# Patient Record
Sex: Female | Born: 1935
Health system: Southern US, Community
[De-identification: ages and names within clinical notes are randomized; demographics above are authoritative.]

## PROBLEM LIST (undated history)

## (undated) DIAGNOSIS — R682 Dry mouth, unspecified: Secondary | ICD-10-CM

## (undated) DIAGNOSIS — R8271 Bacteriuria: Secondary | ICD-10-CM

## (undated) DIAGNOSIS — L24A2 Irritant contact dermatitis due to fecal, urinary or dual incontinence: Secondary | ICD-10-CM

## (undated) DIAGNOSIS — Z8673 Personal history of transient ischemic attack (TIA), and cerebral infarction without residual deficits: Secondary | ICD-10-CM

## (undated) DIAGNOSIS — M19012 Primary osteoarthritis, left shoulder: Secondary | ICD-10-CM

## (undated) DIAGNOSIS — N1831 Chronic kidney disease, stage 3a: Secondary | ICD-10-CM

## (undated) DIAGNOSIS — I10 Essential (primary) hypertension: Secondary | ICD-10-CM

## (undated) DIAGNOSIS — D508 Other iron deficiency anemias: Secondary | ICD-10-CM

## (undated) DIAGNOSIS — F03B0 Unspecified dementia, moderate, without behavioral disturbance, psychotic disturbance, mood disturbance, and anxiety: Secondary | ICD-10-CM

## (undated) DIAGNOSIS — R001 Bradycardia, unspecified: Secondary | ICD-10-CM

## (undated) DIAGNOSIS — E119 Type 2 diabetes mellitus without complications: Secondary | ICD-10-CM

## (undated) DIAGNOSIS — L2089 Other atopic dermatitis: Secondary | ICD-10-CM

## (undated) DIAGNOSIS — F17200 Nicotine dependence, unspecified, uncomplicated: Secondary | ICD-10-CM

## (undated) DIAGNOSIS — L299 Pruritus, unspecified: Secondary | ICD-10-CM

## (undated) DIAGNOSIS — G629 Polyneuropathy, unspecified: Secondary | ICD-10-CM

## (undated) DIAGNOSIS — M6281 Muscle weakness (generalized): Secondary | ICD-10-CM

## (undated) DIAGNOSIS — L03115 Cellulitis of right lower limb: Secondary | ICD-10-CM

## (undated) DIAGNOSIS — S43021A Posterior subluxation of right humerus, initial encounter: Secondary | ICD-10-CM

## (undated) DIAGNOSIS — L509 Urticaria, unspecified: Secondary | ICD-10-CM

## (undated) DIAGNOSIS — R8281 Pyuria: Secondary | ICD-10-CM

## (undated) DIAGNOSIS — E43 Unspecified severe protein-calorie malnutrition: Secondary | ICD-10-CM

## (undated) DIAGNOSIS — M170 Bilateral primary osteoarthritis of knee: Secondary | ICD-10-CM

## (undated) DIAGNOSIS — K5900 Constipation, unspecified: Secondary | ICD-10-CM

## (undated) DIAGNOSIS — G3184 Mild cognitive impairment, so stated: Secondary | ICD-10-CM

## (undated) DIAGNOSIS — Z87440 Personal history of urinary (tract) infections: Secondary | ICD-10-CM

## (undated) DIAGNOSIS — G9341 Metabolic encephalopathy: Secondary | ICD-10-CM

## (undated) DIAGNOSIS — J9601 Acute respiratory failure with hypoxia: Secondary | ICD-10-CM

## (undated) DIAGNOSIS — D631 Anemia in chronic kidney disease: Secondary | ICD-10-CM

## (undated) DIAGNOSIS — R770 Abnormality of albumin: Secondary | ICD-10-CM

## (undated) DIAGNOSIS — R41841 Cognitive communication deficit: Secondary | ICD-10-CM

## (undated) DIAGNOSIS — E079 Disorder of thyroid, unspecified: Secondary | ICD-10-CM

## (undated) DIAGNOSIS — E871 Hypo-osmolality and hyponatremia: Secondary | ICD-10-CM

## (undated) DIAGNOSIS — M79661 Pain in right lower leg: Secondary | ICD-10-CM

## (undated) DIAGNOSIS — N189 Chronic kidney disease, unspecified: Secondary | ICD-10-CM

## (undated) DIAGNOSIS — I739 Peripheral vascular disease, unspecified: Secondary | ICD-10-CM

## (undated) DIAGNOSIS — M5134 Other intervertebral disc degeneration, thoracic region: Secondary | ICD-10-CM

## (undated) HISTORY — DX: Unspecified dementia, moderate, without behavioral disturbance, psychotic disturbance, mood disturbance, and anxiety: F03.B0

## (undated) HISTORY — DX: Muscle weakness (generalized): M62.81

## (undated) HISTORY — DX: Nicotine dependence, unspecified, uncomplicated: F17.200

## (undated) HISTORY — DX: Anemia in chronic kidney disease: N18.9

## (undated) HISTORY — DX: Peripheral vascular disease, unspecified: I73.9

## (undated) HISTORY — DX: Abnormality of albumin: R77.0

## (undated) HISTORY — DX: Urticaria, unspecified: L50.9

## (undated) HISTORY — DX: Hypo-osmolality and hyponatremia: E87.1

## (undated) HISTORY — DX: Pruritus, unspecified: L29.9

## (undated) HISTORY — DX: Polyneuropathy, unspecified: G62.9

## (undated) HISTORY — DX: Bradycardia, unspecified: R00.1

## (undated) HISTORY — DX: Other intervertebral disc degeneration, thoracic region: M51.34

## (undated) HISTORY — DX: Irritant contact dermatitis due to fecal, urinary or dual incontinence: L24.A2

## (undated) HISTORY — DX: Pyuria: R82.81

## (undated) HISTORY — DX: Unspecified severe protein-calorie malnutrition: E43

## (undated) HISTORY — DX: Bacteriuria: R82.71

## (undated) HISTORY — DX: Constipation, unspecified: K59.00

## (undated) HISTORY — DX: Posterior subluxation of right humerus, initial encounter: S43.021A

## (undated) HISTORY — DX: Essential (primary) hypertension: I10

## (undated) HISTORY — DX: Acute respiratory failure with hypoxia: J96.01

## (undated) HISTORY — DX: Mild cognitive impairment of uncertain or unknown etiology: G31.84

## (undated) HISTORY — DX: Bilateral primary osteoarthritis of knee: M17.0

## (undated) HISTORY — DX: Chronic kidney disease, stage 3a: N18.31

## (undated) HISTORY — DX: Personal history of urinary (tract) infections: Z87.440

## (undated) HISTORY — DX: Pain in right lower leg: M79.661

## (undated) HISTORY — DX: Cognitive communication deficit: R41.841

## (undated) HISTORY — DX: Metabolic encephalopathy: G93.41

## (undated) HISTORY — DX: Disorder of thyroid, unspecified: E07.9

## (undated) HISTORY — DX: Personal history of transient ischemic attack (TIA), and cerebral infarction without residual deficits: Z86.73

## (undated) HISTORY — DX: Other atopic dermatitis: L20.89

## (undated) HISTORY — DX: Hypocalcemia: E83.51

## (undated) HISTORY — DX: Primary osteoarthritis, left shoulder: M19.012

## (undated) HISTORY — DX: Dry mouth, unspecified: R68.2

## (undated) HISTORY — DX: Cellulitis of right lower limb: L03.115

## (undated) HISTORY — DX: Other iron deficiency anemias: D50.8

## (undated) HISTORY — DX: Chronic kidney disease, unspecified: D63.1

---

## 1999-09-29 ENCOUNTER — Encounter: Payer: Self-pay | Admitting: Family Medicine

## 1999-09-29 ENCOUNTER — Encounter: Admission: RE | Admit: 1999-09-29 | Discharge: 1999-09-29 | Payer: Self-pay | Admitting: Family Medicine

## 2000-01-12 ENCOUNTER — Emergency Department (HOSPITAL_COMMUNITY): Admission: EM | Admit: 2000-01-12 | Discharge: 2000-01-12 | Payer: Self-pay | Admitting: Emergency Medicine

## 2000-01-13 ENCOUNTER — Encounter: Payer: Self-pay | Admitting: Emergency Medicine

## 2005-09-14 ENCOUNTER — Emergency Department (HOSPITAL_COMMUNITY): Admission: EM | Admit: 2005-09-14 | Discharge: 2005-09-14 | Payer: Self-pay | Admitting: Family Medicine

## 2015-10-11 ENCOUNTER — Ambulatory Visit (INDEPENDENT_AMBULATORY_CARE_PROVIDER_SITE_OTHER): Payer: Medicare Other | Admitting: Sports Medicine

## 2015-10-11 ENCOUNTER — Encounter: Payer: Self-pay | Admitting: Sports Medicine

## 2015-10-11 DIAGNOSIS — B351 Tinea unguium: Secondary | ICD-10-CM

## 2015-10-11 DIAGNOSIS — M79671 Pain in right foot: Secondary | ICD-10-CM | POA: Diagnosis not present

## 2015-10-11 DIAGNOSIS — E119 Type 2 diabetes mellitus without complications: Secondary | ICD-10-CM | POA: Diagnosis not present

## 2015-10-11 DIAGNOSIS — M79672 Pain in left foot: Secondary | ICD-10-CM | POA: Diagnosis not present

## 2015-10-11 NOTE — Progress Notes (Signed)
Patient ID: Penny [name], female   DOB: [birth year], 81 y.o.   MRN: [id number] Subjective: Penny [name] is a 81 y.o. female patient with history of diabetes who presents to office today complaining of [place], painful nails  while ambulating in shoes; unable to trim. Patient states that the glucose reading this morning was not recorded. Patient denies any new changes in medication or new problems. Patient denies any new cramping, numbness, burning or tingling in the legs.  There are no active problems to display for this patient.  No current outpatient prescriptions on file prior to visit.   No current facility-administered medications on file prior to visit.   Allergies not on file  No results found for this or any previous visit (from the past 2160 hour(s)).  Objective: General: Patient is awake, alert, and oriented x 3 and in no acute distress. Ambulates with rolling walker.  Integument: Skin is warm, dry and supple bilateral. Nails are tender, [place], thickened and  dystrophic with subungual debris, consistent with onychomycosis, 1-5 bilateral. No signs of infection. No open lesions or preulcerative lesions present bilateral. Remaining integument unremarkable.  Vasculature:  Dorsalis Pedis pulse 1/4 bilateral. Posterior Tibial pulse  1/4 bilateral.  Capillary fill time <3 sec 1-5 bilateral. Scant hair growth to the level of the digits. Temperature gradient within normal limits. No varicosities present bilateral. No edema present bilateral.   Neurology: The patient has intact sensation measured with a 5.07/10g Semmes Weinstein Monofilament at all pedal sites bilateral . Vibratory sensation slightly diminished bilateral with tuning fork. No Babinski sign present bilateral.   Musculoskeletal: No gross pedal deformities noted bilateral. Muscular strength 5/5 in all lower extremity muscular groups bilateral without pain on range of motion . No tenderness with calf compression  bilateral.  Assessment and Plan: Problem List Items Addressed This Visit    None    Visit Diagnoses    Dermatophytosis of nail    -  Primary    Foot pain, bilateral        Diabetes mellitus without complication ([place])          -Examined patient. -Discussed and educated patient on diabetic foot care, especially with  regards to the vascular, neurological and musculoskeletal systems.  -Stressed the importance of good glycemic control and the detriment of not  controlling glucose levels in relation to the foot. -Mechanically debrided all nails 1-5 bilateral using sterile nail nipper and filed with dremel without incident  -Answered all patient questions -Patient to return in 3 months for at risk foot care -Patient advised to call the office if any problems or questions arise in the meantime.  Landis Martins, DPM

## 2015-10-11 NOTE — Patient Instructions (Signed)
Diabetes and Foot Care Diabetes may cause you to have problems because of poor blood supply (circulation) to your feet and legs. This may cause the skin on your feet to become thinner, break easier, and heal more slowly. Your skin may become dry, and the skin may peel and crack. You may also have nerve damage in your legs and feet causing decreased feeling in them. You may not notice minor injuries to your feet that could lead to infections or more serious problems. Taking care of your feet is one of the most important things you can do for yourself.  HOME CARE INSTRUCTIONS  Wear shoes at all times, even in the house. Do not go barefoot. Bare feet are easily injured.  Check your feet daily for blisters, cuts, and redness. If you cannot see the bottom of your feet, use a mirror or ask someone for help.  Wash your feet with warm water (do not use hot water) and mild soap. Then pat your feet and the areas between your toes until they are completely dry. Do not soak your feet as this can dry your skin.  Apply a moisturizing lotion or petroleum jelly (that does not contain alcohol and is unscented) to the skin on your feet and to dry, brittle toenails. Do not apply lotion between your toes.  Trim your toenails straight across. Do not dig under them or around the cuticle. File the edges of your nails with an emery board or nail file.  Do not cut corns or calluses or try to remove them with medicine.  Wear clean socks or stockings every day. Make sure they are not too tight. Do not wear knee-high stockings since they may decrease blood flow to your legs.  Wear shoes that fit properly and have enough cushioning. To break in new shoes, wear them for just a few hours a day. This prevents you from injuring your feet. Always look in your shoes before you put them on to be sure there are no objects inside.  Do not cross your legs. This may decrease the blood flow to your feet.  If you find a minor scrape,  cut, or break in the skin on your feet, keep it and the skin around it clean and dry. These areas may be cleansed with mild soap and water. Do not cleanse the area with peroxide, alcohol, or iodine.  When you remove an adhesive bandage, be sure not to damage the skin around it.  If you have a wound, look at it several times a day to make sure it is healing.  Do not use heating pads or hot water bottles. They may burn your skin. If you have lost feeling in your feet or legs, you may not know it is happening until it is too late.  Make sure your health care provider performs a complete foot exam at least annually or more often if you have foot problems. Report any cuts, sores, or bruises to your health care provider immediately. SEEK MEDICAL CARE IF:   You have an injury that is not healing.  You have cuts or breaks in the skin.  You have an ingrown nail.  You notice redness on your legs or feet.  You feel burning or tingling in your legs or feet.  You have pain or cramps in your legs and feet.  Your legs or feet are numb.  Your feet always feel cold. SEEK IMMEDIATE MEDICAL CARE IF:   There is increasing redness,   swelling, or pain in or around a wound.  There is a red line that goes up your leg.  Pus is coming from a wound.  You develop a fever or as directed by your health care provider.  You notice a bad smell coming from an ulcer or wound.   This information is not intended to replace advice given to you by your health care provider. Make sure you discuss any questions you have with your health care provider.   Document Released: 06/29/2000 Document Revised: 03/04/2013 Document Reviewed: 12/09/2012 Elsevier Interactive Patient Education 2016 [employer]  

## 2015-10-18 ENCOUNTER — Ambulatory Visit: Payer: Medicare Other | Admitting: Podiatry

## 2016-01-10 ENCOUNTER — Ambulatory Visit: Payer: Medicare Other | Admitting: Sports Medicine

## 2016-01-31 ENCOUNTER — Ambulatory Visit: Payer: Medicare Other | Admitting: Sports Medicine

## 2016-02-07 ENCOUNTER — Ambulatory Visit: Payer: Medicare Other | Admitting: Sports Medicine

## 2016-02-14 ENCOUNTER — Ambulatory Visit (INDEPENDENT_AMBULATORY_CARE_PROVIDER_SITE_OTHER): Payer: Medicare Other | Admitting: Sports Medicine

## 2016-02-14 ENCOUNTER — Encounter: Payer: Self-pay | Admitting: Sports Medicine

## 2016-02-14 DIAGNOSIS — E119 Type 2 diabetes mellitus without complications: Secondary | ICD-10-CM

## 2016-02-14 DIAGNOSIS — M79671 Pain in right foot: Secondary | ICD-10-CM | POA: Diagnosis not present

## 2016-02-14 DIAGNOSIS — M79672 Pain in left foot: Secondary | ICD-10-CM | POA: Diagnosis not present

## 2016-02-14 DIAGNOSIS — B351 Tinea unguium: Secondary | ICD-10-CM

## 2016-02-14 DIAGNOSIS — I739 Peripheral vascular disease, unspecified: Secondary | ICD-10-CM

## 2016-02-14 NOTE — Progress Notes (Signed)
Patient ID: Penny [name], female   DOB: June 08, 1935, 81 y.o.   MRN: [id number] Subjective: Penny [name] is a 81 y.o. female patient with history of diabetes who presents to office today complaining of [place], painful nails  while ambulating in shoes; unable to trim. Patient states that the glucose reading this morning was not recorded. Patient denies any new changes in medication or new problems; States that she is noticing more brown color to legs. Patient denies any new cramping, numbness, burning or tingling in the legs.  There are no active problems to display for this patient.  Current Outpatient Prescriptions on File Prior to Visit  Medication Sig Dispense Refill  . aspirin 81 MG tablet Take 81 mg by mouth daily.    Marland Kitchen diltiazem (CARDIZEM CD) 240 MG 24 hr capsule Take 240 mg by mouth daily.  3  . glipiZIDE (GLUCOTROL XL) 5 MG 24 hr tablet     . metFORMIN (GLUCOPHAGE) 500 MG tablet     . olmesartan-hydrochlorothiazide (BENICAR HCT) 40-25 MG tablet      No current facility-administered medications on file prior to visit.    No Known Allergies  No results found for this or any previous visit (from the past 2160 hour(s)).  Objective: General: Patient is awake, alert, and oriented x 3 and in no acute distress. Ambulates with rolling walker.  Integument: Skin is warm, dry and supple bilateral. Nails are tender, [place], thickened and dystrophic with subungual debris, consistent with onychomycosis, 1-5 bilateral. No signs of infection. No open lesions or preulcerative lesions present bilateral.Minimal callus right lateral heel. Remaining integument unremarkable.  Vasculature:  Dorsalis Pedis pulse 1/4 bilateral. Posterior Tibial pulse  1/4 bilateral.  Capillary fill time <3 sec 1-5 bilateral. Scant hair growth to the level of the digits. Temperature gradient within normal limits. No varicosities present bilateral. 1+ pitting edema present bilateral with brawny discoloration.   Neurology: The  patient has intact sensation measured with a 5.07/10g Semmes Weinstein Monofilament at all pedal sites bilateral . Vibratory sensation slightly diminished bilateral with tuning fork. No Babinski sign present bilateral.   Musculoskeletal: No gross pedal deformities noted bilateral. Muscular strength 5/5 in all lower extremity muscular groups bilateral without pain on range of motion . No tenderness with calf compression bilateral.  Assessment and Plan: Problem List Items Addressed This Visit    None    Visit Diagnoses    Dermatophytosis of nail    -  Primary   Foot pain, bilateral       Diabetes mellitus without complication ([place])         -Examined patient. -Discussed and educated patient on diabetic foot care, especially with  regards to the vascular, neurological and musculoskeletal systems.  -Stressed the importance of good glycemic control and the detriment of not  controlling glucose levels in relation to the foot. -Mechanically debrided all nails 1-5 bilateral using sterile nail nipper and filed with dremel without incident  -Parred using bur at right heel without incident  -Patient to follow up with PCP about compression stockings or lasix for swelling in legs -Answered all patient questions -Patient to return in 3 months for at risk foot care -Patient advised to call the office if any problems or questions arise in the meantime.  Landis Martins, DPM

## 2016-05-22 ENCOUNTER — Ambulatory Visit: Payer: Medicare Other | Admitting: Podiatry

## 2017-08-13 ENCOUNTER — Ambulatory Visit: Payer: Medicare Other | Admitting: Sports Medicine

## 2017-08-13 ENCOUNTER — Encounter: Payer: Self-pay | Admitting: Sports Medicine

## 2017-08-13 DIAGNOSIS — B351 Tinea unguium: Secondary | ICD-10-CM

## 2017-08-13 DIAGNOSIS — M79672 Pain in left foot: Secondary | ICD-10-CM

## 2017-08-13 DIAGNOSIS — I739 Peripheral vascular disease, unspecified: Secondary | ICD-10-CM

## 2017-08-13 DIAGNOSIS — E119 Type 2 diabetes mellitus without complications: Secondary | ICD-10-CM

## 2017-08-13 DIAGNOSIS — M79671 Pain in right foot: Secondary | ICD-10-CM

## 2017-08-13 NOTE — Progress Notes (Signed)
  Patient ID: [name], female   DOB: 09-27-1934, 83 y.o.   MRN: [id number] Subjective: Penny [name] is a 83 y.o. female patient with history of diabetes who presents to office today complaining of [place], painful nails  while ambulating in shoes; unable to trim. Patient states that the glucose reading this morning was not recorded but last time she checked it was good. Patient denies any new changes in medication or new problems; States that she saw her PCP in Aug and will see him again soon. Denies any other issues.     There are no active problems to display for this patient.  Current Outpatient Medications on File Prior to Visit  Medication Sig Dispense Refill  . aspirin 81 MG tablet Take 81 mg by mouth daily.    Marland Kitchen diltiazem (CARDIZEM CD) 240 MG 24 hr capsule Take 240 mg by mouth daily.  3  . glipiZIDE (GLUCOTROL XL) 5 MG 24 hr tablet     . metFORMIN (GLUCOPHAGE) 500 MG tablet     . olmesartan-hydrochlorothiazide (BENICAR HCT) 40-25 MG tablet      No current facility-administered medications on file prior to visit.    No Known Allergies  No results found for this or any previous visit (from the past 2160 hour(s)).  Objective: General: Patient is awake, alert, and oriented x 3 and in no acute distress. Ambulates with rolling walker.  Integument: Skin is warm, dry and supple bilateral. Nails are tender, [place], thickened and dystrophic with subungual debris, consistent with onychomycosis, 1-5 bilateral. No signs of infection. No open lesions or preulcerative lesions present bilateral.Minimal callus/dry skin at right lateral heel. Remaining integument unremarkable.  Vasculature:  Dorsalis Pedis pulse 1/4 bilateral. Posterior Tibial pulse  1/4 bilateral.  Capillary fill time <3 sec 1-5 bilateral. Scant hair growth to the level of the digits. Temperature gradient within normal limits. No varicosities present bilateral. 1+ pitting edema present bilateral with brawny discoloration.    Neurology: The patient has intact sensation measured with a 5.07/10g Semmes Weinstein Monofilament at all pedal sites bilateral . Vibratory sensation slightly diminished bilateral with tuning fork. No Babinski sign present bilateral.   Musculoskeletal: No gross pedal deformities noted bilateral. Muscular strength 5/5 in all lower extremity muscular groups bilateral without pain on range of motion . No tenderness with calf compression bilateral.  Assessment and Plan: Problem List Items Addressed This Visit    None    Visit Diagnoses    Dermatophytosis of nail    -  Primary   Foot pain, bilateral       Diabetes mellitus without complication ([place])       PVD (peripheral vascular disease) ([place])         -Examined patient. -Re-Discussed and re-educated patient on diabetic foot care, especially with  regards to the vascular, neurological and musculoskeletal systems.  -Stressed the importance of good glycemic control and the detriment of not  controlling glucose levels in relation to the foot. -Mechanically debrided all nails 1-5 bilateral using sterile nail nipper and filed with dremel without incident  -Recommend daily skin emollients for dry heels -Patient to continue follow up with PCP about compression stockings or lasix for swelling in legs -Answered all patient questions -Patient to return in 3 months for at risk foot care -Patient advised to call the office if any problems or questions arise in the meantime.  Landis Martins, DPM

## 2017-11-12 ENCOUNTER — Ambulatory Visit: Payer: Medicare Other | Admitting: Sports Medicine

## 2018-07-28 ENCOUNTER — Encounter (INDEPENDENT_AMBULATORY_CARE_PROVIDER_SITE_OTHER): Payer: Medicare Other | Admitting: Ophthalmology

## 2018-07-28 DIAGNOSIS — H35033 Hypertensive retinopathy, bilateral: Secondary | ICD-10-CM

## 2018-07-28 DIAGNOSIS — H35373 Puckering of macula, bilateral: Secondary | ICD-10-CM

## 2018-07-28 DIAGNOSIS — I10 Essential (primary) hypertension: Secondary | ICD-10-CM

## 2018-07-28 DIAGNOSIS — H26493 Other secondary cataract, bilateral: Secondary | ICD-10-CM

## 2018-07-28 DIAGNOSIS — H43813 Vitreous degeneration, bilateral: Secondary | ICD-10-CM | POA: Diagnosis not present

## 2018-08-25 ENCOUNTER — Encounter (INDEPENDENT_AMBULATORY_CARE_PROVIDER_SITE_OTHER): Payer: Medicare Other | Admitting: Ophthalmology

## 2018-08-25 DIAGNOSIS — H26492 Other secondary cataract, left eye: Secondary | ICD-10-CM

## 2018-09-22 ENCOUNTER — Encounter (INDEPENDENT_AMBULATORY_CARE_PROVIDER_SITE_OTHER): Payer: Medicare Other | Admitting: Ophthalmology

## 2018-09-22 DIAGNOSIS — H2703 Aphakia, bilateral: Secondary | ICD-10-CM

## 2019-01-23 ENCOUNTER — Other Ambulatory Visit: Payer: Self-pay

## 2019-01-23 ENCOUNTER — Encounter: Payer: Self-pay | Admitting: Podiatry

## 2019-01-23 ENCOUNTER — Ambulatory Visit: Payer: Medicare Other | Admitting: Podiatry

## 2019-01-23 VITALS — Temp 97.9°F

## 2019-01-23 DIAGNOSIS — B351 Tinea unguium: Secondary | ICD-10-CM

## 2019-01-23 DIAGNOSIS — E119 Type 2 diabetes mellitus without complications: Secondary | ICD-10-CM | POA: Diagnosis not present

## 2019-01-23 DIAGNOSIS — I739 Peripheral vascular disease, unspecified: Secondary | ICD-10-CM

## 2019-01-23 NOTE — Progress Notes (Signed)
Complaint:  Visit Type: Patient returns to my office for continued preventative foot care services. Complaint: Patient states" my nails have grown [place] and thick and become painful to walk and wear shoes" Patient has been diagnosed with DM with no foot complications. The patient presents for preventative foot care services. No changes to ROS.  Patient has not been seen in over 18 months.  Podiatric Exam: Vascular: dorsalis pedis and posterior tibial pulses are palpable bilateral. Capillary return is immediate. Temperature gradient is WNL. Skin turgor WNL  Sensorium: Normal Semmes Weinstein monofilament test. Normal tactile sensation bilaterally. Nail Exam: Pt has thick disfigured discolored nails with subungual debris noted bilateral entire nail hallux through fifth toenails Ulcer Exam: There is no evidence of ulcer or pre-ulcerative changes or infection. Orthopedic Exam: Muscle tone and strength are WNL. No limitations in general ROM. No crepitus or effusions noted. Foot type and digits show no abnormalities. Bony prominences are unremarkable. Skin: No Porokeratosis. No infection or ulcers.  Subungual blister right toe.  Diagnosis:  Onychomycosis, , Pain in right toe, pain in left toes  Treatment & Plan Procedures and Treatment: Consent by patient was obtained for treatment procedures.   Debridement of mycotic and hypertrophic toenails, 1 through 5 bilateral and clearing of subungual debris. No ulceration, no infection noted. Neosporin/DSD Return Visit-Office Procedure: Patient instructed to return to the office for a follow up visit 4 months for continued evaluation and treatment.    Gardiner Barefoot DPM

## 2019-05-26 ENCOUNTER — Ambulatory Visit: Payer: Medicare Other | Admitting: Podiatry

## 2019-05-26 ENCOUNTER — Other Ambulatory Visit: Payer: Self-pay

## 2019-05-26 ENCOUNTER — Encounter: Payer: Self-pay | Admitting: Podiatry

## 2019-05-26 DIAGNOSIS — E119 Type 2 diabetes mellitus without complications: Secondary | ICD-10-CM | POA: Diagnosis not present

## 2019-05-26 DIAGNOSIS — B351 Tinea unguium: Secondary | ICD-10-CM

## 2019-05-26 DIAGNOSIS — I739 Peripheral vascular disease, unspecified: Secondary | ICD-10-CM

## 2019-05-26 NOTE — Progress Notes (Signed)
Complaint:  Visit Type: Patient returns to my office for continued preventative foot care services. Complaint: Patient states" my nails have grown [place] and thick and become painful to walk and wear shoes" Patient has been diagnosed with DM with no foot complications. The patient presents for preventative foot care services. No changes to ROS.  Patient has not been seen in over 18 months.  Podiatric Exam: Vascular: dorsalis pedis and posterior tibial pulses are palpable bilateral. Capillary return is immediate. Temperature gradient is WNL. Skin turgor WNL  Sensorium: Normal Semmes Weinstein monofilament test. Normal tactile sensation bilaterally. Nail Exam: Pt has thick disfigured discolored nails with subungual debris noted bilateral entire nail hallux through fifth toenails Ulcer Exam: There is no evidence of ulcer or pre-ulcerative changes or infection. Orthopedic Exam: Muscle tone and strength are WNL. No limitations in general ROM. No crepitus or effusions noted. Foot type and digits show no abnormalities. Bony prominences are unremarkable. Skin: No Porokeratosis. No infection or ulcers.  Subungual blister right toe.  Diagnosis:  Onychomycosis, , Pain in right toe, pain in left toes  Treatment & Plan Procedures and Treatment: Consent by patient was obtained for treatment procedures.   Debridement of mycotic and hypertrophic toenails, 1 through 5 bilateral and clearing of subungual debris. No ulceration, no infection noted. Neosporin/DSD Return Visit-Office Procedure: Patient instructed to return to the office for a follow up visit 4 months for continued evaluation and treatment.    Kuzey Ogata DPM 

## 2019-09-22 ENCOUNTER — Encounter (INDEPENDENT_AMBULATORY_CARE_PROVIDER_SITE_OTHER): Payer: Medicare Other | Admitting: Ophthalmology

## 2019-09-22 ENCOUNTER — Ambulatory Visit: Payer: Medicare Other | Admitting: Podiatry

## 2020-08-11 DIAGNOSIS — Z7984 Long term (current) use of oral hypoglycemic drugs: Secondary | ICD-10-CM | POA: Diagnosis not present

## 2020-08-11 DIAGNOSIS — H5202 Hypermetropia, left eye: Secondary | ICD-10-CM | POA: Diagnosis not present

## 2020-08-11 DIAGNOSIS — E119 Type 2 diabetes mellitus without complications: Secondary | ICD-10-CM | POA: Diagnosis not present

## 2020-08-11 DIAGNOSIS — H52223 Regular astigmatism, bilateral: Secondary | ICD-10-CM | POA: Diagnosis not present

## 2020-08-11 DIAGNOSIS — H524 Presbyopia: Secondary | ICD-10-CM | POA: Diagnosis not present

## 2020-08-22 ENCOUNTER — Encounter (INDEPENDENT_AMBULATORY_CARE_PROVIDER_SITE_OTHER): Payer: Medicare Other | Admitting: Ophthalmology

## 2020-08-24 DIAGNOSIS — M159 Polyosteoarthritis, unspecified: Secondary | ICD-10-CM | POA: Diagnosis not present

## 2020-08-24 DIAGNOSIS — H3500 Unspecified background retinopathy: Secondary | ICD-10-CM | POA: Diagnosis not present

## 2020-08-24 DIAGNOSIS — G720 Drug-induced myopathy: Secondary | ICD-10-CM | POA: Diagnosis not present

## 2020-08-24 DIAGNOSIS — E1121 Type 2 diabetes mellitus with diabetic nephropathy: Secondary | ICD-10-CM | POA: Diagnosis not present

## 2020-08-24 DIAGNOSIS — I129 Hypertensive chronic kidney disease with stage 1 through stage 4 chronic kidney disease, or unspecified chronic kidney disease: Secondary | ICD-10-CM | POA: Diagnosis not present

## 2020-08-24 DIAGNOSIS — E7800 Pure hypercholesterolemia, unspecified: Secondary | ICD-10-CM | POA: Diagnosis not present

## 2020-08-24 DIAGNOSIS — N183 Chronic kidney disease, stage 3 unspecified: Secondary | ICD-10-CM | POA: Diagnosis not present

## 2020-08-31 ENCOUNTER — Encounter (INDEPENDENT_AMBULATORY_CARE_PROVIDER_SITE_OTHER): Payer: Medicare Other | Admitting: Ophthalmology

## 2020-08-31 ENCOUNTER — Other Ambulatory Visit: Payer: Self-pay

## 2020-08-31 DIAGNOSIS — I10 Essential (primary) hypertension: Secondary | ICD-10-CM | POA: Diagnosis not present

## 2020-08-31 DIAGNOSIS — H43813 Vitreous degeneration, bilateral: Secondary | ICD-10-CM | POA: Diagnosis not present

## 2020-08-31 DIAGNOSIS — E113293 Type 2 diabetes mellitus with mild nonproliferative diabetic retinopathy without macular edema, bilateral: Secondary | ICD-10-CM

## 2020-08-31 DIAGNOSIS — H35373 Puckering of macula, bilateral: Secondary | ICD-10-CM | POA: Diagnosis not present

## 2020-08-31 DIAGNOSIS — H35033 Hypertensive retinopathy, bilateral: Secondary | ICD-10-CM | POA: Diagnosis not present

## 2021-02-01 ENCOUNTER — Encounter: Payer: Self-pay | Admitting: Podiatry

## 2021-02-01 ENCOUNTER — Ambulatory Visit: Payer: Medicare Other | Admitting: Podiatry

## 2021-02-01 ENCOUNTER — Other Ambulatory Visit: Payer: Self-pay

## 2021-02-01 DIAGNOSIS — M79674 Pain in right toe(s): Secondary | ICD-10-CM

## 2021-02-01 DIAGNOSIS — M79675 Pain in left toe(s): Secondary | ICD-10-CM | POA: Diagnosis not present

## 2021-02-01 DIAGNOSIS — E1159 Type 2 diabetes mellitus with other circulatory complications: Secondary | ICD-10-CM | POA: Insufficient documentation

## 2021-02-01 DIAGNOSIS — B351 Tinea unguium: Secondary | ICD-10-CM | POA: Insufficient documentation

## 2021-02-01 NOTE — Progress Notes (Signed)
This patient returns to my office for at risk foot care.  This patient requires this care by a professional since this patient will be at risk due to having diabetes mellitus.  This patient is unable to cut nails herself since the patient cannot reach her nails.These nails are painful walking and wearing shoes.  This patient presents for at risk foot care today. Patient has not been seen in over 2 years.  General Appearance  Alert, conversant and in no acute stress.  Vascular  Dorsalis pedis and posterior tibial  pulses are absent bilaterally.  Capillary return is within normal limits  bilaterally. Cold feet.bilaterally. Absent digital hair.  Neurologic  Senn-Weinstein monofilament wire test within normal limits  bilaterally. Muscle power within normal limits bilaterally.  Nails Thick disfigured discolored nails with subungual debris  from hallux to fifth toes bilaterally. No evidence of bacterial infection or drainage bilaterally.  Orthopedic  No limitations of motion  feet .  No crepitus or effusions noted.  No bony pathology or digital deformities noted.  Skin  normotropic skin with no porokeratosis noted bilaterally.  No signs of infections or ulcers noted.     Onychomycosis  Pain in right toes  Pain in left toes  Consent was obtained for treatment procedures.   Mechanical debridement of nails 1-5  bilaterally performed with a nail nipper.  Filed with dremel without incident.    Return office visit    3 months                  Told patient to return for periodic foot care and evaluation due to potential at risk complications.   Gardiner Barefoot DPM

## 2021-03-01 DIAGNOSIS — M159 Polyosteoarthritis, unspecified: Secondary | ICD-10-CM | POA: Diagnosis not present

## 2021-03-01 DIAGNOSIS — E1121 Type 2 diabetes mellitus with diabetic nephropathy: Secondary | ICD-10-CM | POA: Diagnosis not present

## 2021-03-01 DIAGNOSIS — G720 Drug-induced myopathy: Secondary | ICD-10-CM | POA: Diagnosis not present

## 2021-03-01 DIAGNOSIS — L309 Dermatitis, unspecified: Secondary | ICD-10-CM | POA: Diagnosis not present

## 2021-03-01 DIAGNOSIS — R634 Abnormal weight loss: Secondary | ICD-10-CM | POA: Diagnosis not present

## 2021-03-01 DIAGNOSIS — I129 Hypertensive chronic kidney disease with stage 1 through stage 4 chronic kidney disease, or unspecified chronic kidney disease: Secondary | ICD-10-CM | POA: Diagnosis not present

## 2021-03-01 DIAGNOSIS — Z1389 Encounter for screening for other disorder: Secondary | ICD-10-CM | POA: Diagnosis not present

## 2021-03-01 DIAGNOSIS — H3500 Unspecified background retinopathy: Secondary | ICD-10-CM | POA: Diagnosis not present

## 2021-03-01 DIAGNOSIS — Z0000 Encounter for general adult medical examination without abnormal findings: Secondary | ICD-10-CM | POA: Diagnosis not present

## 2021-03-01 DIAGNOSIS — E7800 Pure hypercholesterolemia, unspecified: Secondary | ICD-10-CM | POA: Diagnosis not present

## 2021-03-01 DIAGNOSIS — E113591 Type 2 diabetes mellitus with proliferative diabetic retinopathy without macular edema, right eye: Secondary | ICD-10-CM | POA: Diagnosis not present

## 2021-04-17 ENCOUNTER — Inpatient Hospital Stay (HOSPITAL_COMMUNITY)
Admission: EM | Admit: 2021-04-17 | Discharge: 2021-04-21 | DRG: 922 | Disposition: A | Discharge status: Skilled Nursing Facility | Payer: Medicare Other | Attending: Internal Medicine | Admitting: Internal Medicine

## 2021-04-17 ENCOUNTER — Encounter (HOSPITAL_COMMUNITY): Payer: Self-pay | Admitting: Internal Medicine

## 2021-04-17 ENCOUNTER — Emergency Department (HOSPITAL_COMMUNITY): Payer: Medicare Other

## 2021-04-17 ENCOUNTER — Other Ambulatory Visit: Payer: Self-pay

## 2021-04-17 DIAGNOSIS — L89216 Pressure-induced deep tissue damage of right hip: Secondary | ICD-10-CM | POA: Diagnosis present

## 2021-04-17 DIAGNOSIS — Z7984 Long term (current) use of oral hypoglycemic drugs: Secondary | ICD-10-CM

## 2021-04-17 DIAGNOSIS — T68XXXA Hypothermia, initial encounter: Secondary | ICD-10-CM | POA: Diagnosis not present

## 2021-04-17 DIAGNOSIS — I739 Peripheral vascular disease, unspecified: Secondary | ICD-10-CM | POA: Diagnosis not present

## 2021-04-17 DIAGNOSIS — G9341 Metabolic encephalopathy: Secondary | ICD-10-CM | POA: Diagnosis present

## 2021-04-17 DIAGNOSIS — X31XXXA Exposure to excessive natural cold, initial encounter: Secondary | ICD-10-CM | POA: Diagnosis not present

## 2021-04-17 DIAGNOSIS — D638 Anemia in other chronic diseases classified elsewhere: Secondary | ICD-10-CM | POA: Diagnosis not present

## 2021-04-17 DIAGNOSIS — T68XXXD Hypothermia, subsequent encounter: Secondary | ICD-10-CM | POA: Diagnosis not present

## 2021-04-17 DIAGNOSIS — M170 Bilateral primary osteoarthritis of knee: Secondary | ICD-10-CM | POA: Diagnosis not present

## 2021-04-17 DIAGNOSIS — N183 Chronic kidney disease, stage 3 unspecified: Secondary | ICD-10-CM | POA: Diagnosis not present

## 2021-04-17 DIAGNOSIS — Z79899 Other long term (current) drug therapy: Secondary | ICD-10-CM

## 2021-04-17 DIAGNOSIS — Z7982 Long term (current) use of aspirin: Secondary | ICD-10-CM | POA: Diagnosis not present

## 2021-04-17 DIAGNOSIS — L89122 Pressure ulcer of left upper back, stage 2: Secondary | ICD-10-CM | POA: Diagnosis present

## 2021-04-17 DIAGNOSIS — M2578 Osteophyte, vertebrae: Secondary | ICD-10-CM | POA: Diagnosis not present

## 2021-04-17 DIAGNOSIS — R531 Weakness: Secondary | ICD-10-CM

## 2021-04-17 DIAGNOSIS — M6282 Rhabdomyolysis: Secondary | ICD-10-CM | POA: Diagnosis not present

## 2021-04-17 DIAGNOSIS — L89210 Pressure ulcer of right hip, unstageable: Secondary | ICD-10-CM

## 2021-04-17 DIAGNOSIS — Z7401 Bed confinement status: Secondary | ICD-10-CM | POA: Diagnosis not present

## 2021-04-17 DIAGNOSIS — Z6821 Body mass index (BMI) 21.0-21.9, adult: Secondary | ICD-10-CM | POA: Diagnosis not present

## 2021-04-17 DIAGNOSIS — E860 Dehydration: Secondary | ICD-10-CM | POA: Diagnosis present

## 2021-04-17 DIAGNOSIS — S199XXA Unspecified injury of neck, initial encounter: Secondary | ICD-10-CM | POA: Diagnosis not present

## 2021-04-17 DIAGNOSIS — M160 Bilateral primary osteoarthritis of hip: Secondary | ICD-10-CM | POA: Diagnosis not present

## 2021-04-17 DIAGNOSIS — W07XXXA Fall from chair, initial encounter: Secondary | ICD-10-CM | POA: Diagnosis present

## 2021-04-17 DIAGNOSIS — M25539 Pain in unspecified wrist: Secondary | ICD-10-CM | POA: Diagnosis not present

## 2021-04-17 DIAGNOSIS — E1159 Type 2 diabetes mellitus with other circulatory complications: Secondary | ICD-10-CM

## 2021-04-17 DIAGNOSIS — S6991XA Unspecified injury of right wrist, hand and finger(s), initial encounter: Secondary | ICD-10-CM | POA: Diagnosis not present

## 2021-04-17 DIAGNOSIS — E440 Moderate protein-calorie malnutrition: Secondary | ICD-10-CM | POA: Diagnosis present

## 2021-04-17 DIAGNOSIS — E1122 Type 2 diabetes mellitus with diabetic chronic kidney disease: Secondary | ICD-10-CM | POA: Diagnosis not present

## 2021-04-17 DIAGNOSIS — T796XXA Traumatic ischemia of muscle, initial encounter: Secondary | ICD-10-CM | POA: Diagnosis not present

## 2021-04-17 DIAGNOSIS — N1831 Chronic kidney disease, stage 3a: Secondary | ICD-10-CM | POA: Diagnosis not present

## 2021-04-17 DIAGNOSIS — N179 Acute kidney failure, unspecified: Secondary | ICD-10-CM | POA: Diagnosis not present

## 2021-04-17 DIAGNOSIS — L89102 Pressure ulcer of unspecified part of back, stage 2: Secondary | ICD-10-CM

## 2021-04-17 DIAGNOSIS — R4189 Other symptoms and signs involving cognitive functions and awareness: Secondary | ICD-10-CM | POA: Diagnosis present

## 2021-04-17 DIAGNOSIS — E1151 Type 2 diabetes mellitus with diabetic peripheral angiopathy without gangrene: Secondary | ICD-10-CM | POA: Diagnosis present

## 2021-04-17 DIAGNOSIS — D631 Anemia in chronic kidney disease: Secondary | ICD-10-CM | POA: Diagnosis not present

## 2021-04-17 DIAGNOSIS — M6281 Muscle weakness (generalized): Secondary | ICD-10-CM | POA: Diagnosis not present

## 2021-04-17 DIAGNOSIS — N390 Urinary tract infection, site not specified: Secondary | ICD-10-CM | POA: Diagnosis not present

## 2021-04-17 DIAGNOSIS — Z515 Encounter for palliative care: Secondary | ICD-10-CM | POA: Diagnosis not present

## 2021-04-17 DIAGNOSIS — Z66 Do not resuscitate: Secondary | ICD-10-CM | POA: Diagnosis not present

## 2021-04-17 DIAGNOSIS — L89112 Pressure ulcer of right upper back, stage 2: Secondary | ICD-10-CM | POA: Diagnosis not present

## 2021-04-17 DIAGNOSIS — R6889 Other general symptoms and signs: Secondary | ICD-10-CM | POA: Diagnosis not present

## 2021-04-17 DIAGNOSIS — R0902 Hypoxemia: Secondary | ICD-10-CM | POA: Diagnosis not present

## 2021-04-17 DIAGNOSIS — R5383 Other fatigue: Secondary | ICD-10-CM | POA: Diagnosis not present

## 2021-04-17 DIAGNOSIS — S43001A Unspecified subluxation of right shoulder joint, initial encounter: Secondary | ICD-10-CM | POA: Diagnosis not present

## 2021-04-17 DIAGNOSIS — Z1159 Encounter for screening for other viral diseases: Secondary | ICD-10-CM | POA: Diagnosis not present

## 2021-04-17 DIAGNOSIS — M7989 Other specified soft tissue disorders: Secondary | ICD-10-CM | POA: Diagnosis not present

## 2021-04-17 DIAGNOSIS — Z043 Encounter for examination and observation following other accident: Secondary | ICD-10-CM | POA: Diagnosis not present

## 2021-04-17 DIAGNOSIS — M19011 Primary osteoarthritis, right shoulder: Secondary | ICD-10-CM | POA: Diagnosis not present

## 2021-04-17 DIAGNOSIS — Z743 Need for continuous supervision: Secondary | ICD-10-CM | POA: Diagnosis not present

## 2021-04-17 DIAGNOSIS — L89890 Pressure ulcer of other site, unstageable: Secondary | ICD-10-CM | POA: Diagnosis not present

## 2021-04-17 DIAGNOSIS — G319 Degenerative disease of nervous system, unspecified: Secondary | ICD-10-CM | POA: Diagnosis not present

## 2021-04-17 DIAGNOSIS — L89152 Pressure ulcer of sacral region, stage 2: Secondary | ICD-10-CM | POA: Diagnosis not present

## 2021-04-17 DIAGNOSIS — M25461 Effusion, right knee: Secondary | ICD-10-CM | POA: Diagnosis not present

## 2021-04-17 DIAGNOSIS — S0990XA Unspecified injury of head, initial encounter: Secondary | ICD-10-CM | POA: Diagnosis not present

## 2021-04-17 DIAGNOSIS — M25462 Effusion, left knee: Secondary | ICD-10-CM | POA: Diagnosis not present

## 2021-04-17 DIAGNOSIS — I129 Hypertensive chronic kidney disease with stage 1 through stage 4 chronic kidney disease, or unspecified chronic kidney disease: Secondary | ICD-10-CM | POA: Diagnosis not present

## 2021-04-17 DIAGNOSIS — G3184 Mild cognitive impairment, so stated: Secondary | ICD-10-CM | POA: Diagnosis not present

## 2021-04-17 DIAGNOSIS — R5381 Other malaise: Secondary | ICD-10-CM | POA: Diagnosis not present

## 2021-04-17 DIAGNOSIS — R770 Abnormality of albumin: Secondary | ICD-10-CM | POA: Diagnosis not present

## 2021-04-17 DIAGNOSIS — I6523 Occlusion and stenosis of bilateral carotid arteries: Secondary | ICD-10-CM | POA: Diagnosis not present

## 2021-04-17 DIAGNOSIS — Z7189 Other specified counseling: Secondary | ICD-10-CM | POA: Diagnosis not present

## 2021-04-17 DIAGNOSIS — Z20822 Contact with and (suspected) exposure to covid-19: Secondary | ICD-10-CM | POA: Diagnosis present

## 2021-04-17 DIAGNOSIS — E041 Nontoxic single thyroid nodule: Secondary | ICD-10-CM | POA: Diagnosis not present

## 2021-04-17 HISTORY — DX: Chronic kidney disease, stage 3 unspecified: N18.30

## 2021-04-17 LAB — CBC WITH DIFFERENTIAL/PLATELET
Abs Immature Granulocytes: 0.02 10*3/uL (ref 0.00–0.07)
Basophils Absolute: 0 10*3/uL (ref 0.0–0.1)
Basophils Relative: 0 %
Eosinophils Absolute: 0.1 10*3/uL (ref 0.0–0.5)
Eosinophils Relative: 2 %
HCT: 37.8 % (ref 36.0–46.0)
Hemoglobin: 12 g/dL (ref 12.0–15.0)
Immature Granulocytes: 0 %
Lymphocytes Relative: 6 %
Lymphs Abs: 0.4 10*3/uL — ABNORMAL LOW (ref 0.7–4.0)
MCH: 31.3 pg (ref 26.0–34.0)
MCHC: 31.7 g/dL (ref 30.0–36.0)
MCV: 98.7 fL (ref 80.0–100.0)
Monocytes Absolute: 0.4 10*3/uL (ref 0.1–1.0)
Monocytes Relative: 7 %
Neutro Abs: 5.7 10*3/uL (ref 1.7–7.7)
Neutrophils Relative %: 85 %
Platelets: 186 10*3/uL (ref 150–400)
RBC: 3.83 MIL/uL — ABNORMAL LOW (ref 3.87–5.11)
RDW: 13.3 % (ref 11.5–15.5)
WBC: 6.7 10*3/uL (ref 4.0–10.5)
nRBC: 0 % (ref 0.0–0.2)

## 2021-04-17 LAB — COMPREHENSIVE METABOLIC PANEL
ALT: 22 U/L (ref 0–44)
AST: 40 U/L (ref 15–41)
Albumin: 2.7 g/dL — ABNORMAL LOW (ref 3.5–5.0)
Alkaline Phosphatase: 60 U/L (ref 38–126)
Anion gap: 10 (ref 5–15)
BUN: 27 mg/dL — ABNORMAL HIGH (ref 8–23)
CO2: 26 mmol/L (ref 22–32)
Calcium: 9.1 mg/dL (ref 8.9–10.3)
Chloride: 103 mmol/L (ref 98–111)
Creatinine, Ser: 1.06 mg/dL — ABNORMAL HIGH (ref 0.44–1.00)
GFR, Estimated: 52 mL/min — ABNORMAL LOW (ref >60)
Glucose, Bld: 106 mg/dL — ABNORMAL HIGH (ref 70–99)
Potassium: 3.5 mmol/L (ref 3.5–5.1)
Sodium: 139 mmol/L (ref 135–145)
Total Bilirubin: 1.3 mg/dL — ABNORMAL HIGH (ref 0.3–1.2)
Total Protein: 7.7 g/dL (ref 6.5–8.1)

## 2021-04-17 LAB — LACTIC ACID, PLASMA: Lactic Acid, Venous: 1.4 mmol/L (ref 0.5–1.9)

## 2021-04-17 LAB — APTT: aPTT: 28 seconds (ref 24–36)

## 2021-04-17 LAB — RESP PANEL BY RT-PCR (FLU A&B, COVID) ARPGX2
Influenza A by PCR: NEGATIVE
Influenza B by PCR: NEGATIVE
SARS Coronavirus 2 by RT PCR: NEGATIVE

## 2021-04-17 LAB — PROTIME-INR
INR: 1 (ref 0.8–1.2)
Prothrombin Time: 13.4 seconds (ref 11.4–15.2)

## 2021-04-17 LAB — CBG MONITORING, ED: Glucose-Capillary: 87 mg/dL (ref 70–99)

## 2021-04-17 LAB — CK: Total CK: 351 U/L — ABNORMAL HIGH (ref 38–234)

## 2021-04-17 IMAGING — CR DG HUMERUS 2V *R*
2 series · 2 of 2 positions shown · non-contrast
Comparison: None.

CLINICAL DATA: Unwitnessed fall.

EXAM:
RIGHT HUMERUS - 2+ VIEW

[humerus ap]
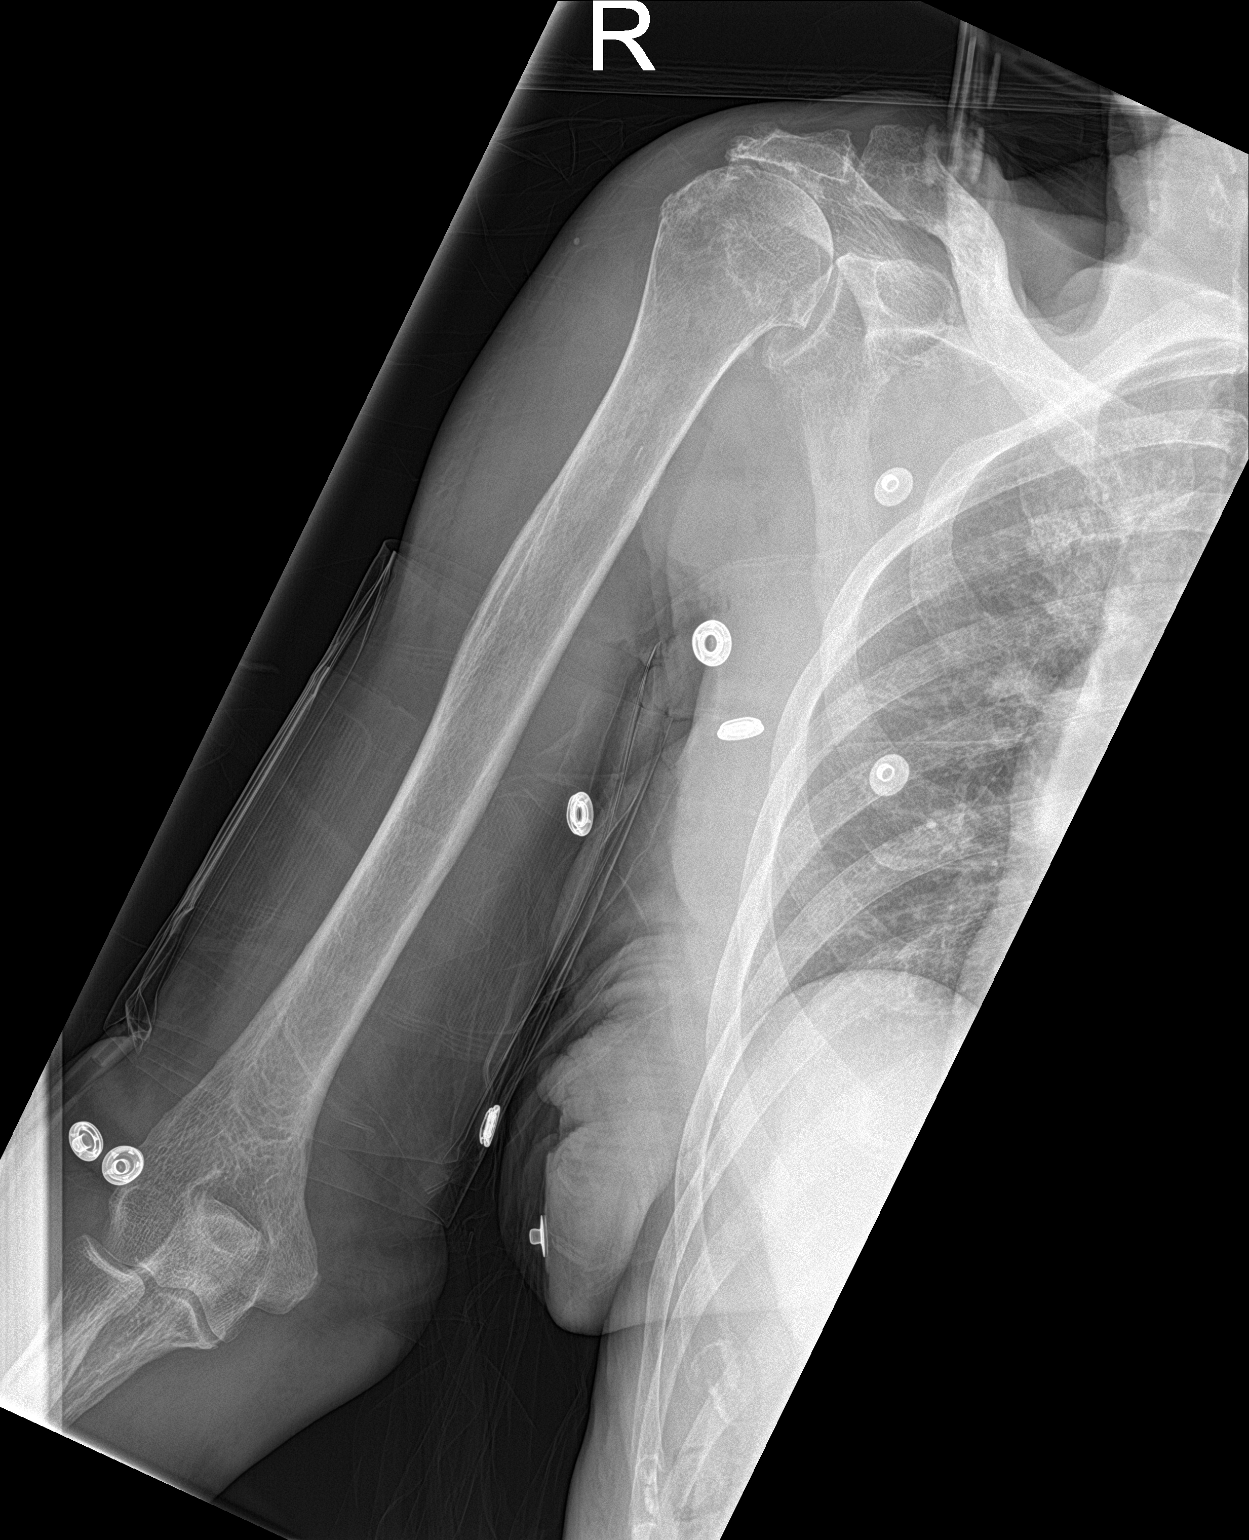

[humerus lat]
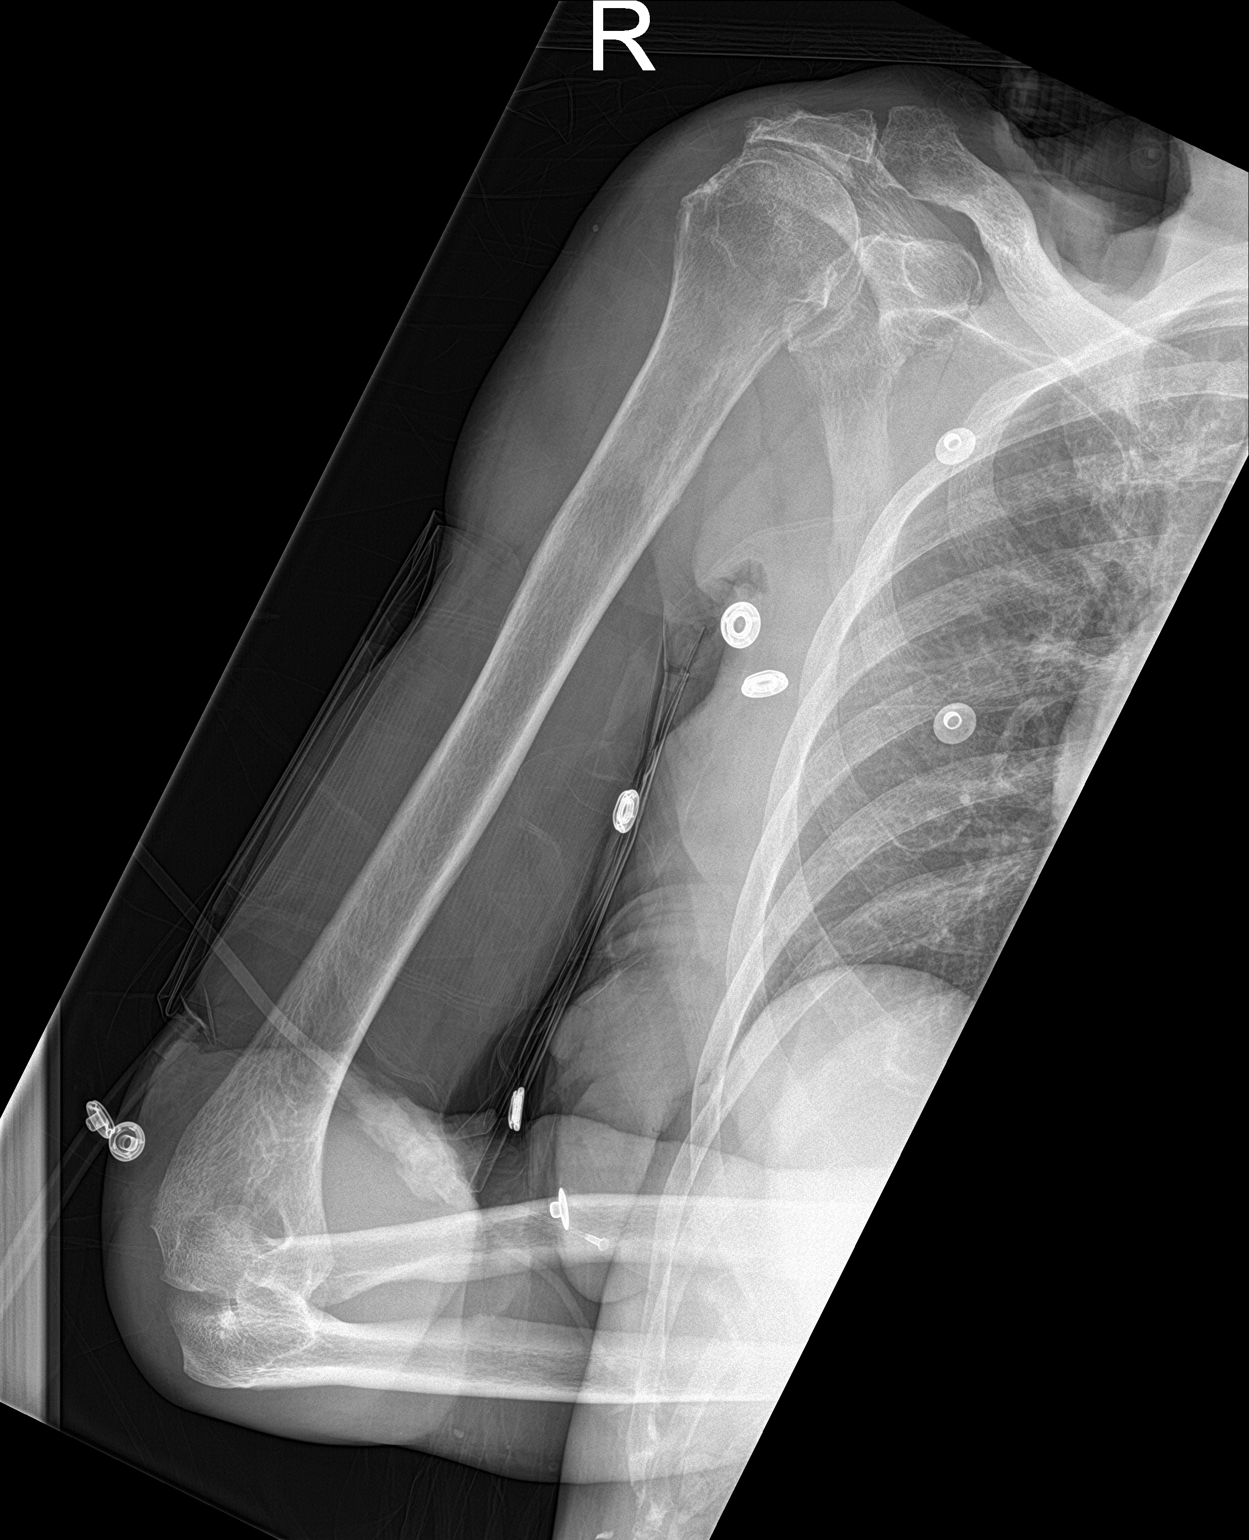

[2 of 2 positions shown; findings below may reference images not displayed]

FINDINGS: There is no evidence of acute fracture. Mild superior subluxation of
the right humeral head is seen with respect to the right glenoid.
Mild to moderate severity degenerative changes are also seen
involving the right shoulder and right acromioclavicular joint. Soft
tissues are unremarkable.
IMPRESSION: 1. No acute fracture.
2. Mild superior subluxation of the right humeral head which may be
chronic in nature.
3. Mild to moderate severity degenerative changes.

## 2021-04-17 IMAGING — CR DG KNEE COMPLETE 4+V*R*
4 series · 4 of 4 positions shown · non-contrast
Comparison: None.

CLINICAL DATA: Unwitnessed fall.

EXAM:
RIGHT KNEE - COMPLETE 4+ VIEW

[knee ap]
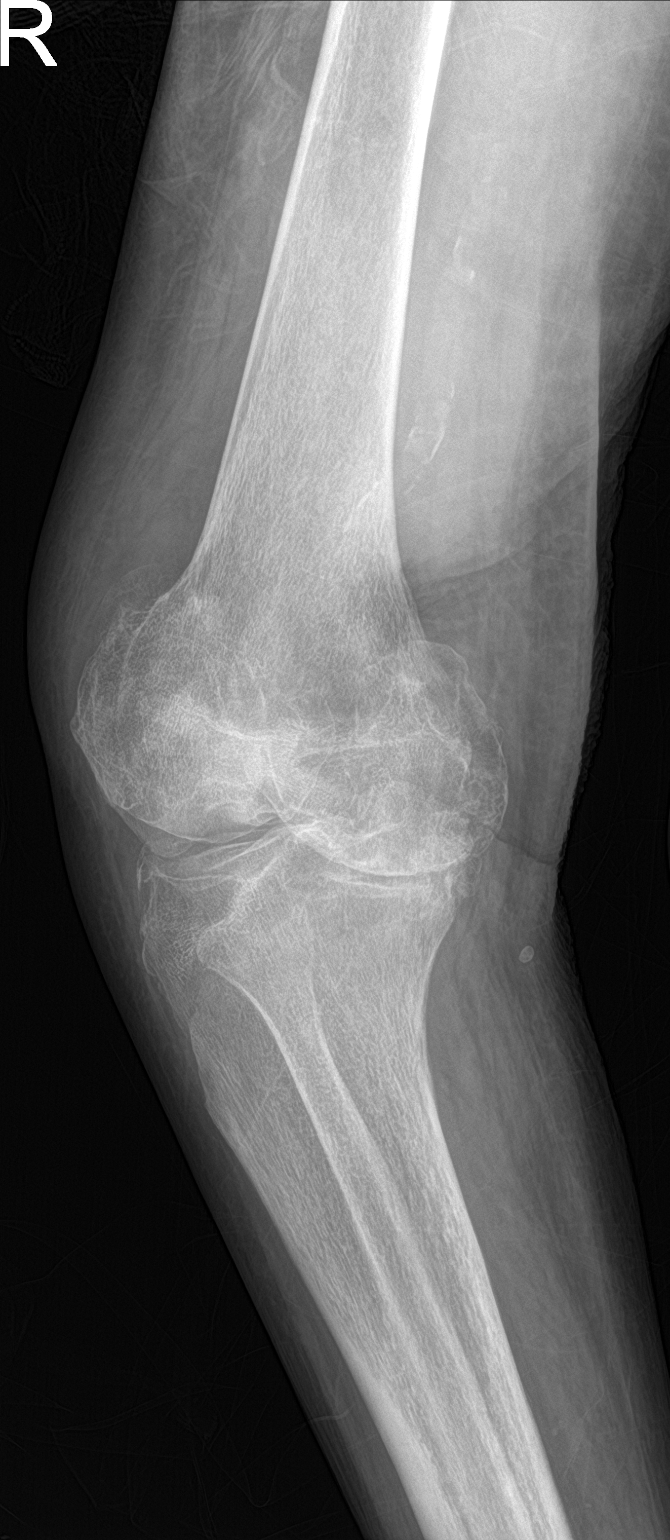

[knee lat]
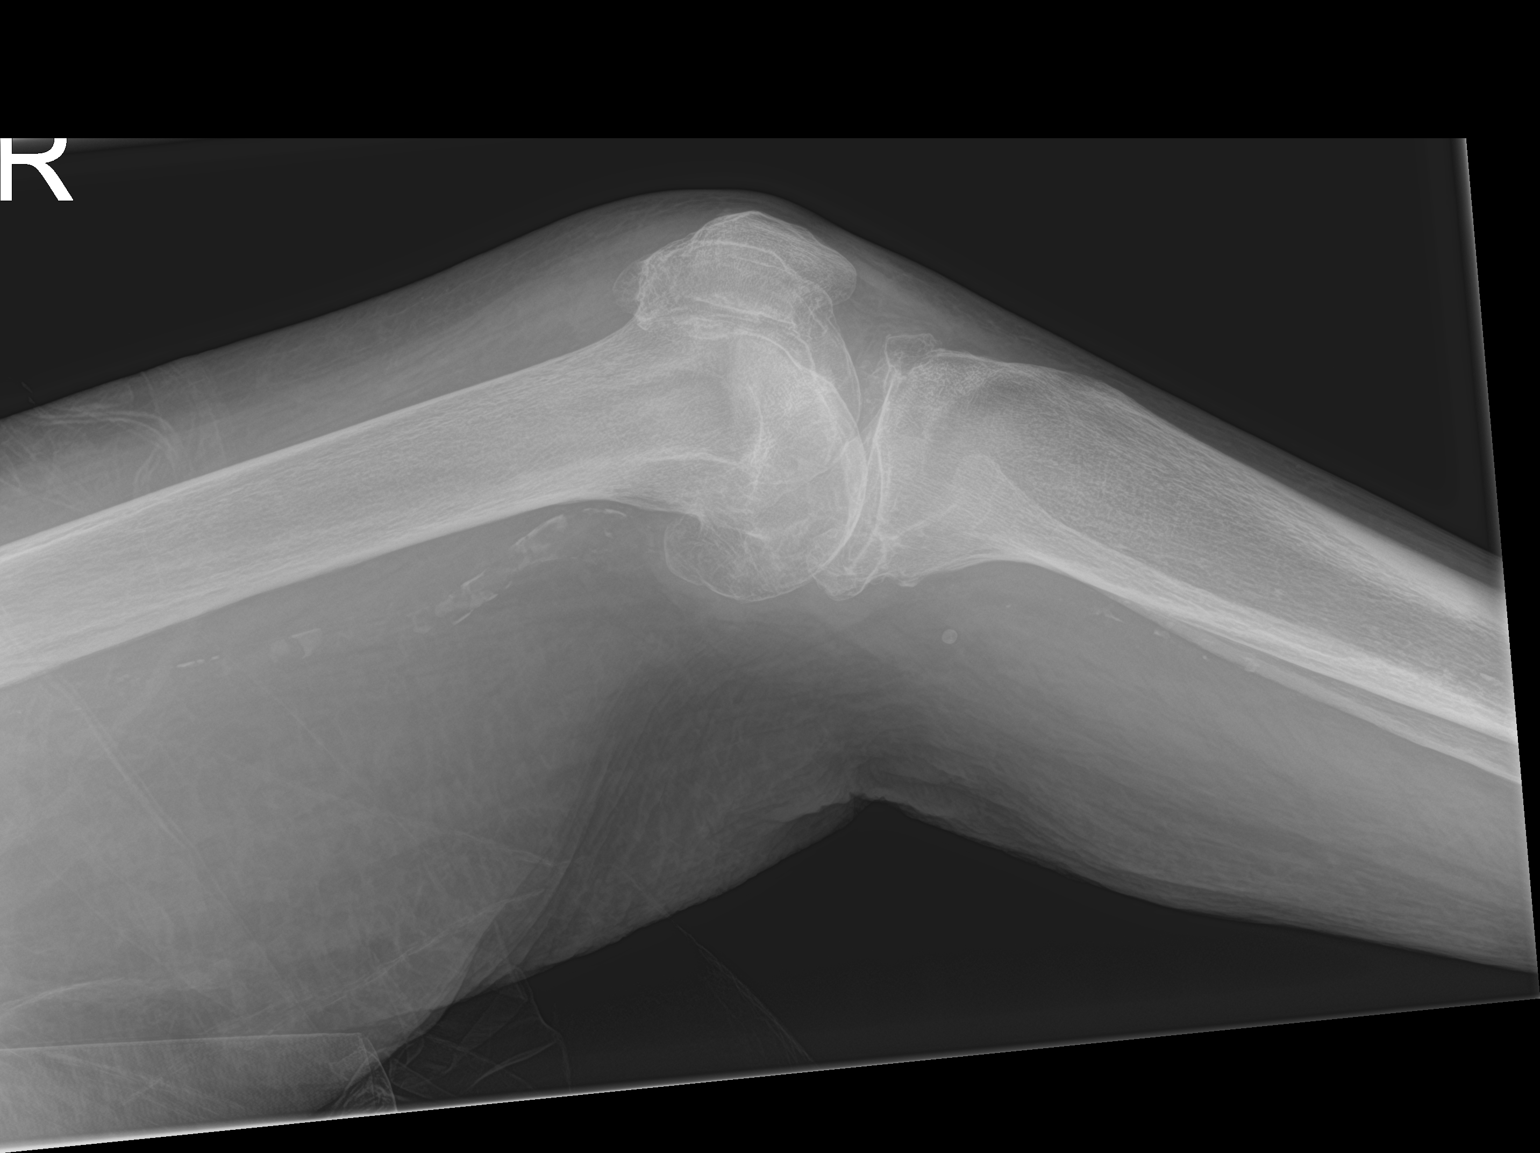

[knee obl (1 of 2)]
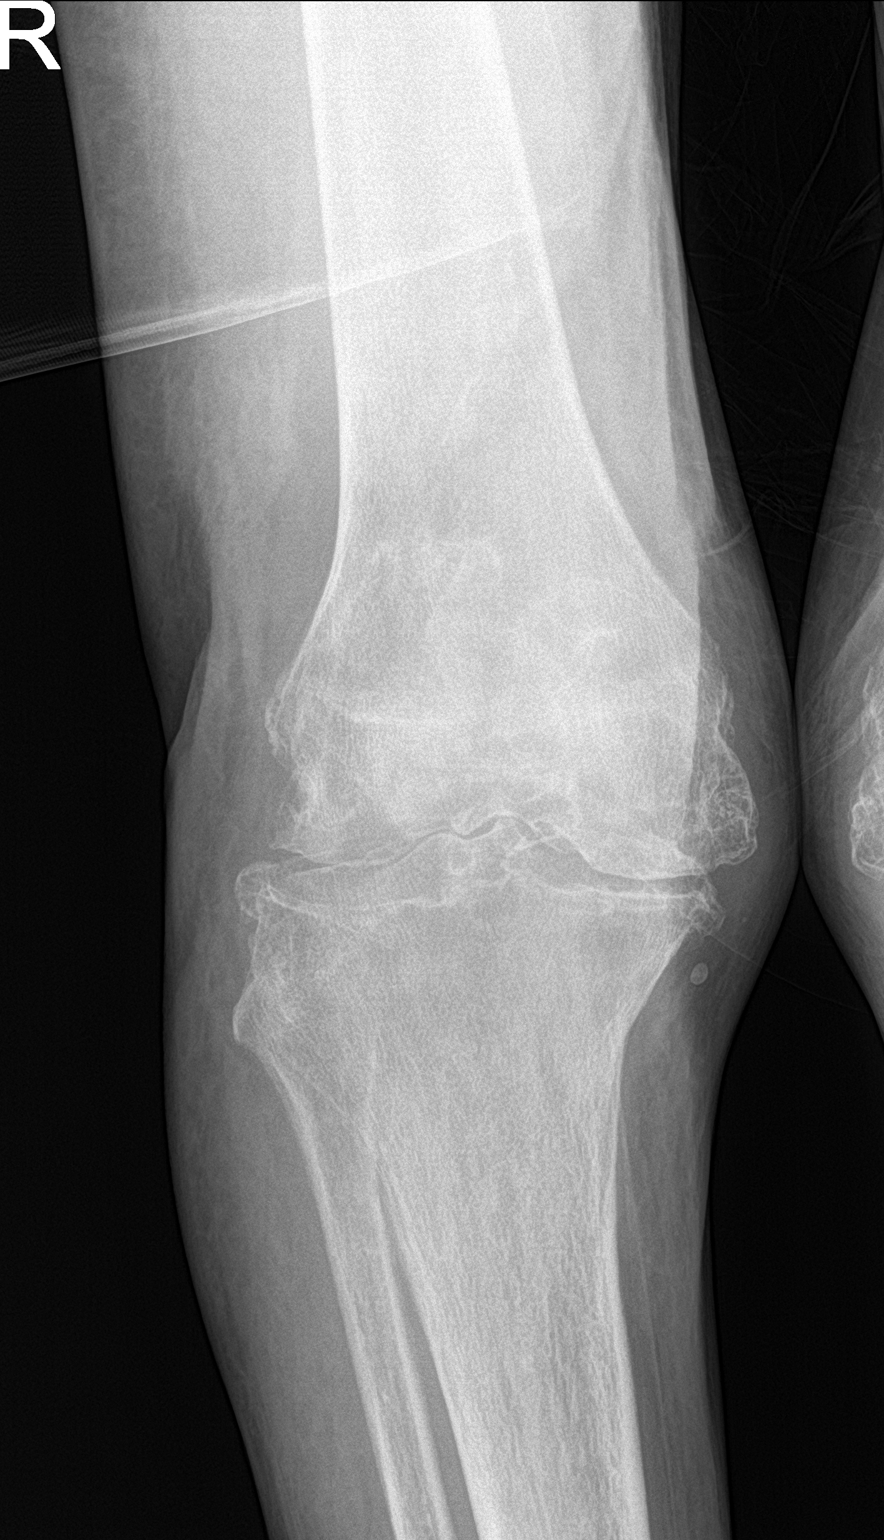

[knee obl (2 of 2)]
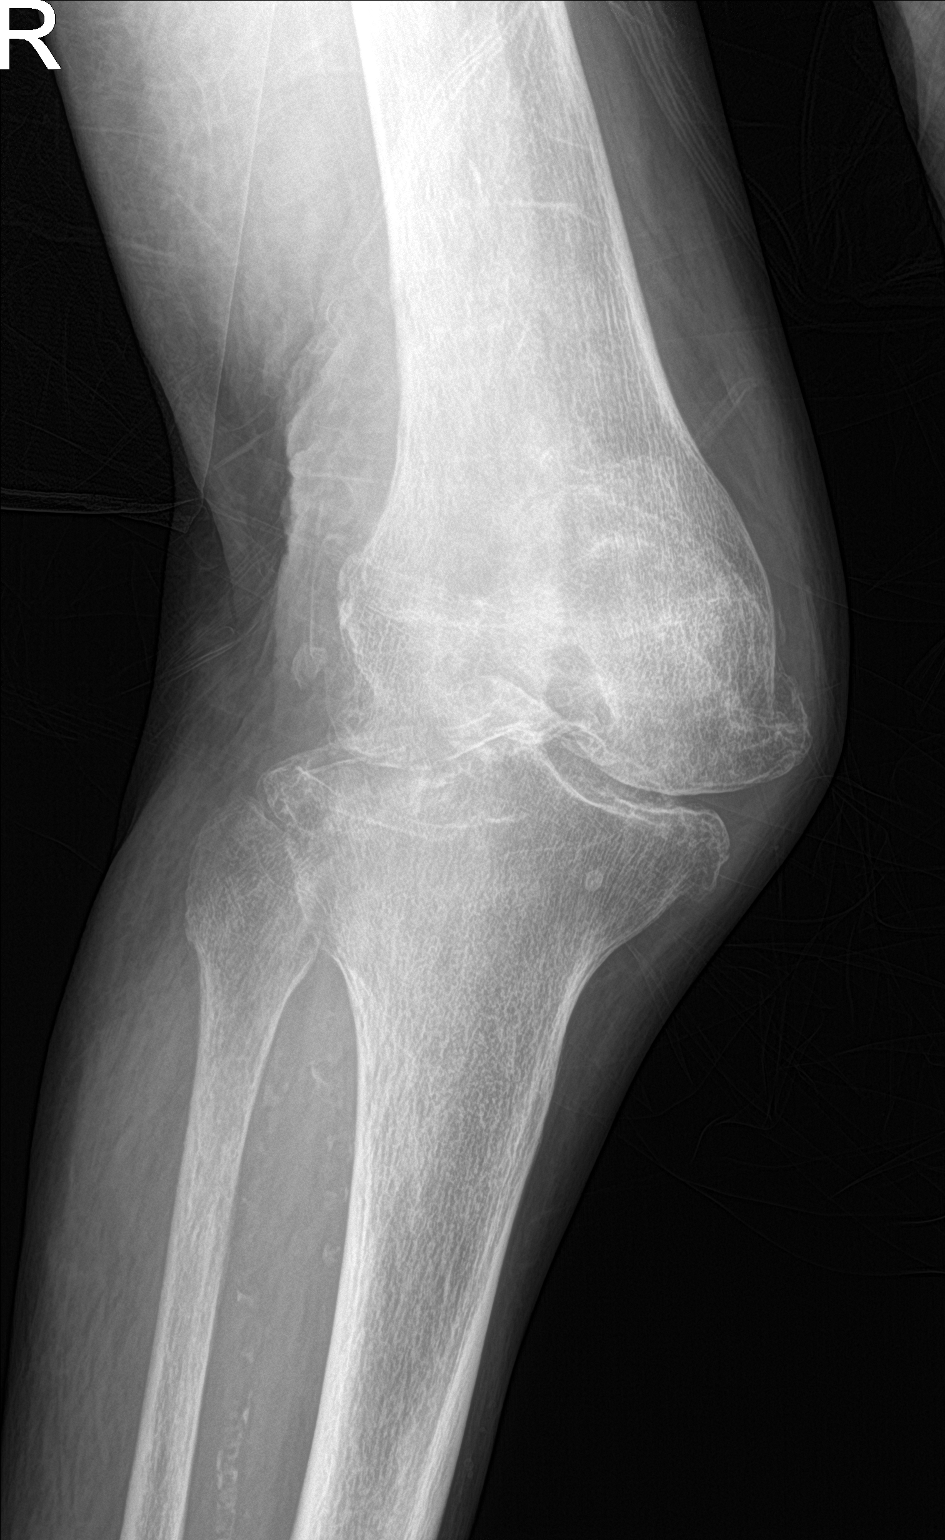

[4 of 4 positions shown; findings below may reference images not displayed]

FINDINGS: No evidence of an acute fracture. Mild chronic appearing lateral
subluxation of the right tibia is noted with respect to the distal
right femur. Marked severity medial and lateral tibiofemoral
compartment space narrowing is seen. Marked severity patellofemoral
narrowing is also noted. There is moderate to marked severity
vascular calcification. A very small joint effusion is suspected.
IMPRESSION: 1. No evidence of acute osseous abnormality.
2. Severe tricompartmental degenerative changes.
3. Very small joint effusion.

## 2021-04-17 IMAGING — CT CT HEAD W/O CM
3 series · 16 of 40 positions shown, 18 images · non-contrast
Comparison: None.

CLINICAL DATA: Polytrauma.

EXAM:
CT HEAD WITHOUT CONTRAST
CT CERVICAL SPINE WITHOUT CONTRAST
TECHNIQUE: Multidetector CT imaging of the head and cervical spine was
performed following the standard protocol without intravenous
contrast. Multiplanar CT image reconstructions of the cervical spine
were also generated.

[Series 3: head without · axial · non-contrast · 0.44mm/px · z∈[+1148,+1268]mm · 7 of 32 slices shown, 9 images]
[im 4/32  brain]
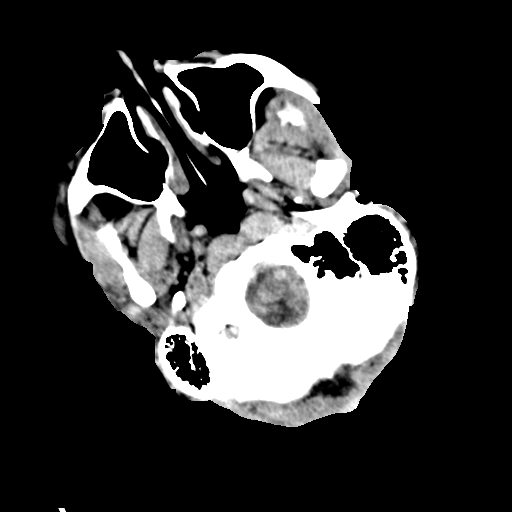
[im 4/32  bone]
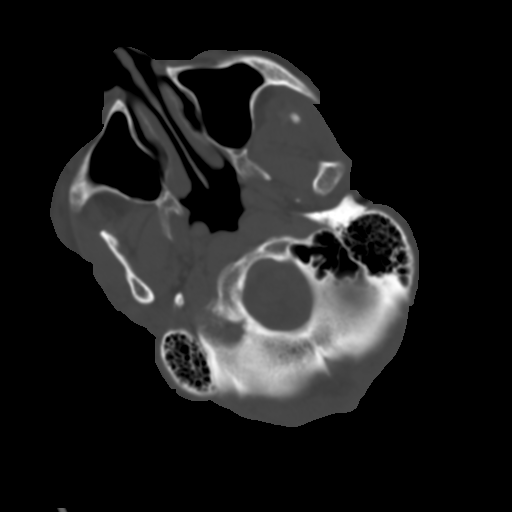
[im 8/32  brain]
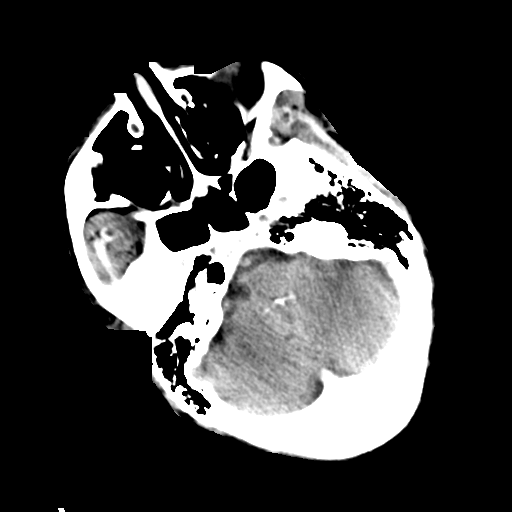
[im 12/32  brain]
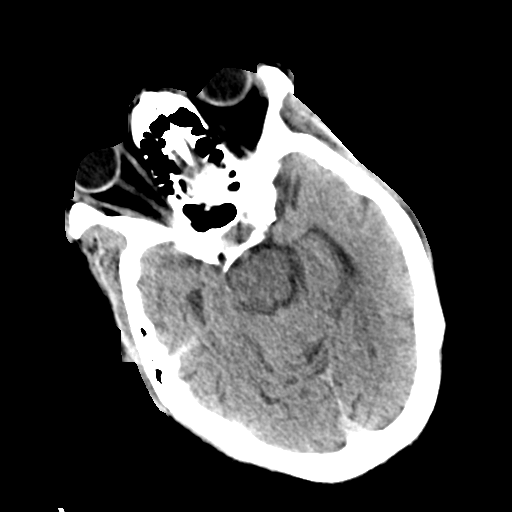
[im 16/32  brain]
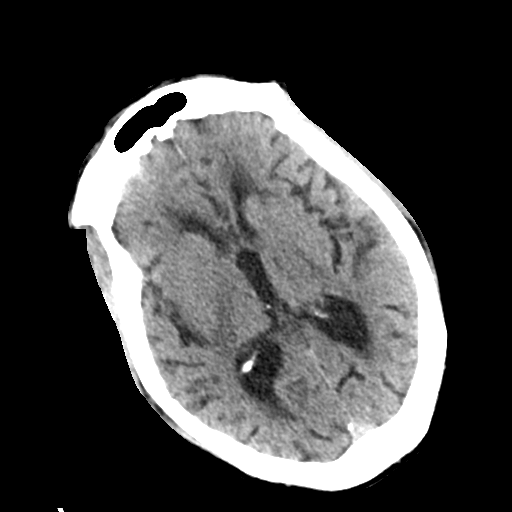
[im 20/32  brain]
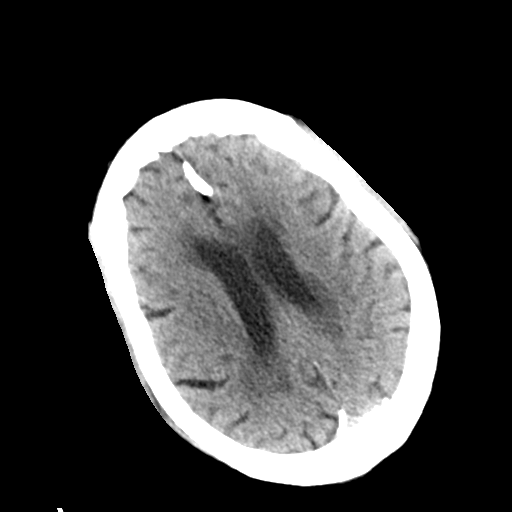
[im 20/32  bone]
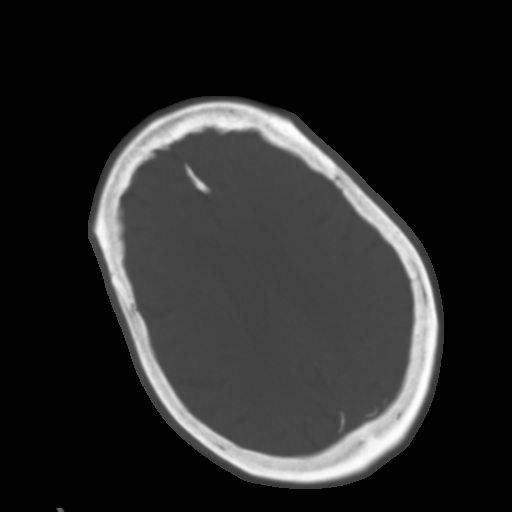
[im 24/32  brain]
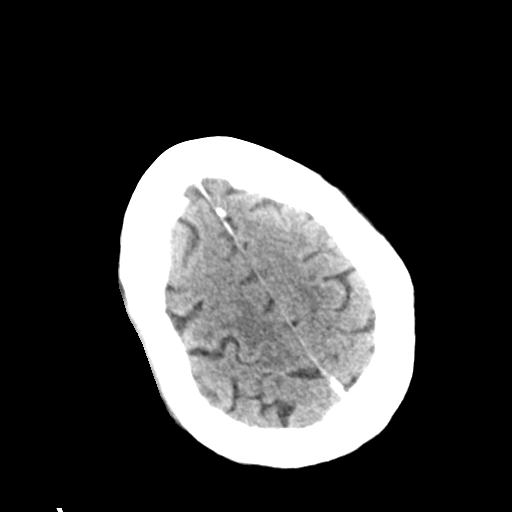
[im 28/32  brain]
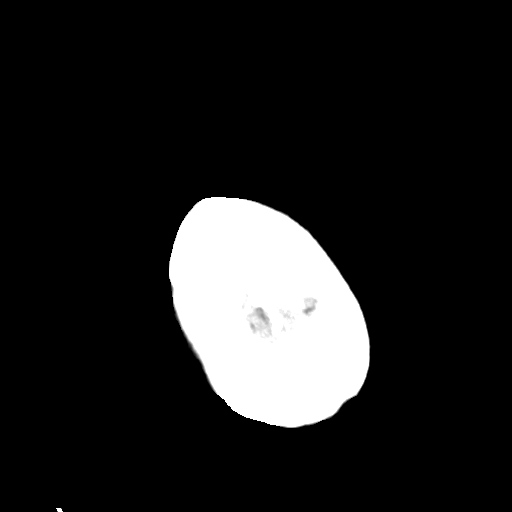

[Series 4: head bone · axial · 0.44mm/px · z∈[+1147,+1241]mm · 6 of 79 slices shown]
[im 8/79  bone]
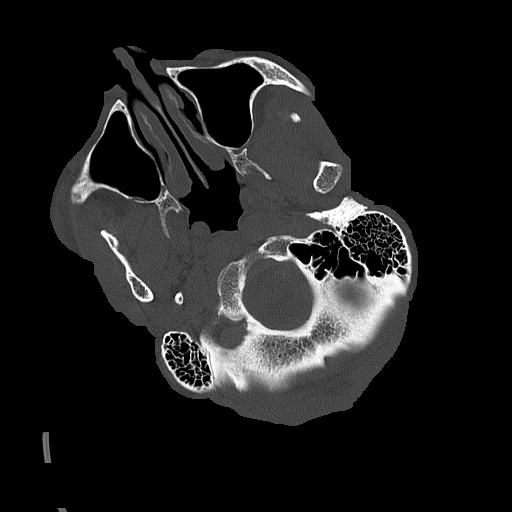
[im 16/79  bone]
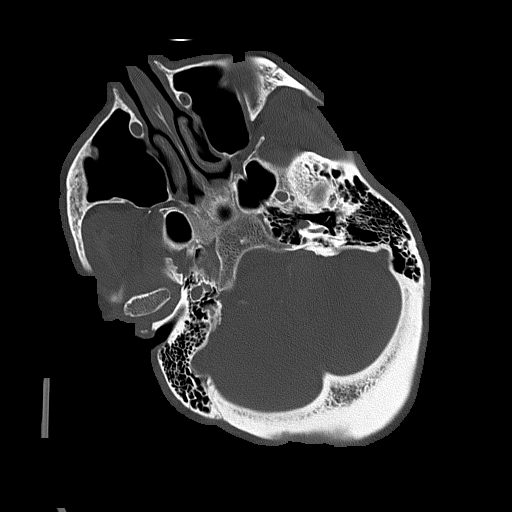
[im 24/79  bone]
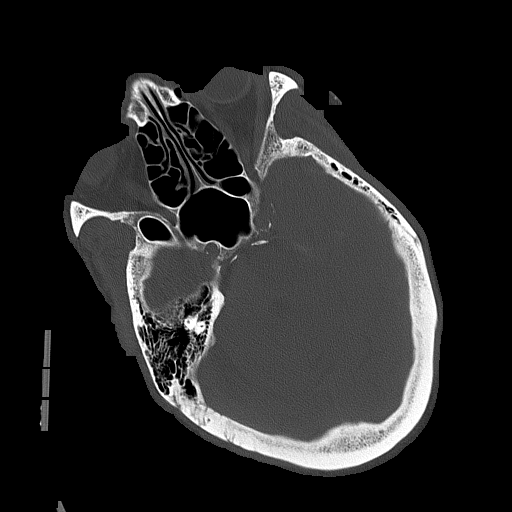
[im 36/79  bone]
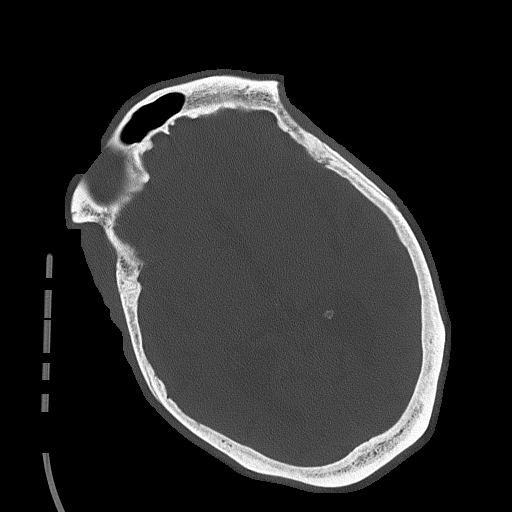
[im 43/79  bone]
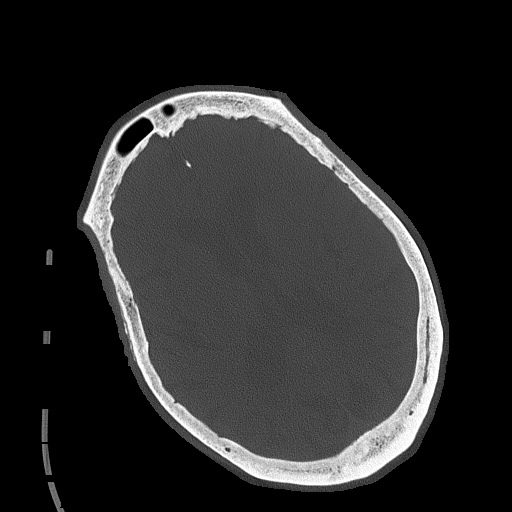
[im 55/79  bone]
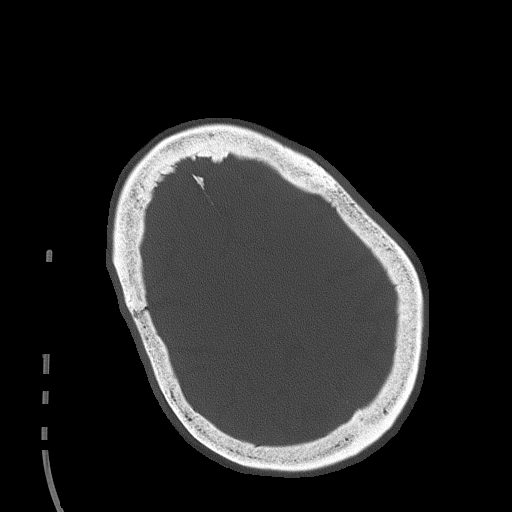

[Series 5: head without cor · coronal · non-contrast · 0.37mm/px · 3 of 63 slices shown]
[im 21/63  brain]
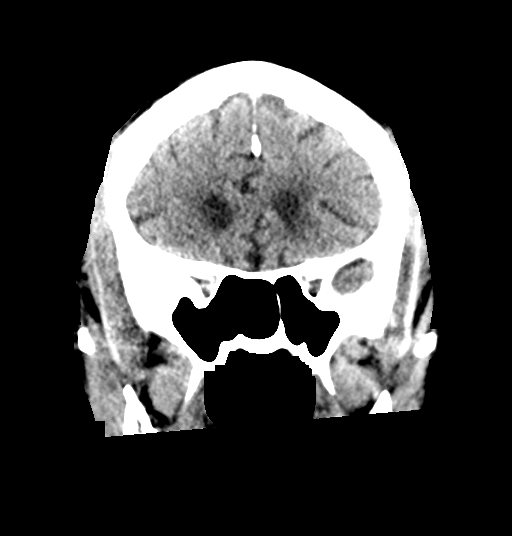
[im 28/63  brain]
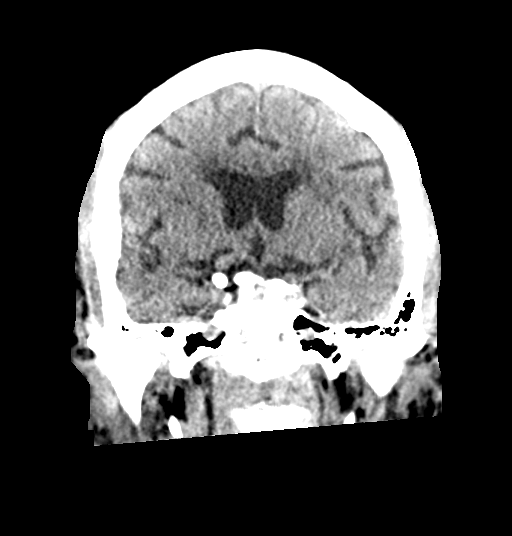
[im 35/63  brain]
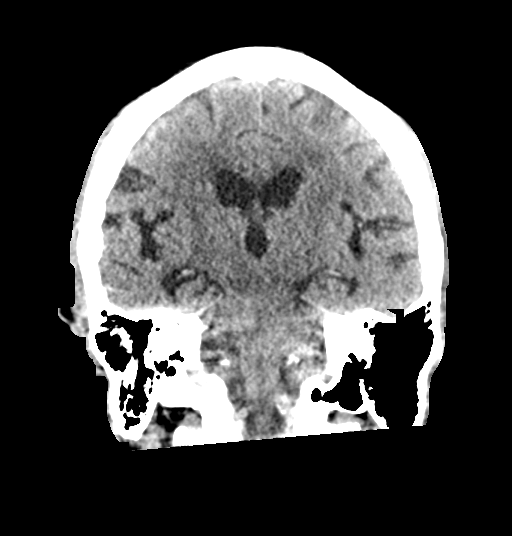

[16 of 40 positions shown; findings below may reference images not displayed]

FINDINGS: CT HEAD FINDINGS

Brain: No evidence of acute infarction, hemorrhage, hydrocephalus,
extra-axial collection or mass lesion/mass effect. There is mild
periventricular white matter hypodensity, likely chronic small
vessel ischemic change. There is mild diffuse atrophy.

Vascular: Atherosclerotic calcifications are present within the
cavernous internal carotid arteries.

Skull: Normal. Negative for fracture or focal lesion.

Sinuses/Orbits: No acute finding.

Other: There is right facial and right frontal soft tissue swelling.
There are punctate skin densities, likely calcifications of the face
bilaterally.

CT CERVICAL SPINE FINDINGS

Alignment: Normal.

Skull base and vertebrae: No acute fracture. No primary bone lesion
or focal pathologic process.

Soft tissues and spinal canal: No prevertebral fluid or swelling. No
visible canal hematoma.

Disc levels: There is disc space narrowing and endplate osteophyte
formation at C5-C6 and C7-T1 compatible with degenerative change.
There is scattered mild neural foraminal stenosis bilaterally. Disc
bulge at C7-T1 causes moderate central canal stenosis.

Upper chest: There is a 3 mm nodule in the left upper lobe image
5/90.

Other: There is a heavily calcified left thyroid nodule measuring 9
mm. There is a 5 mm hypodense left thyroid nodule.
IMPRESSION: 1.  No acute intracranial process.

2. No acute fracture or traumatic subluxation of the cervical spine.

3. 3 mm nodule in the left upper lobe. If patient is at low risk for
bronchus carcinoma no follow-up is necessary. The patient is at high
risk a follow-up CT is recommended in 12 months to reassess.

## 2021-04-17 IMAGING — CR DG SHOULDER 2+V*R*
2 series · 2 of 2 positions shown · non-contrast
Comparison: None.

CLINICAL DATA: Unwitnessed fall.

EXAM:
RIGHT SHOULDER - 2+ VIEW

[shoulder grashey]
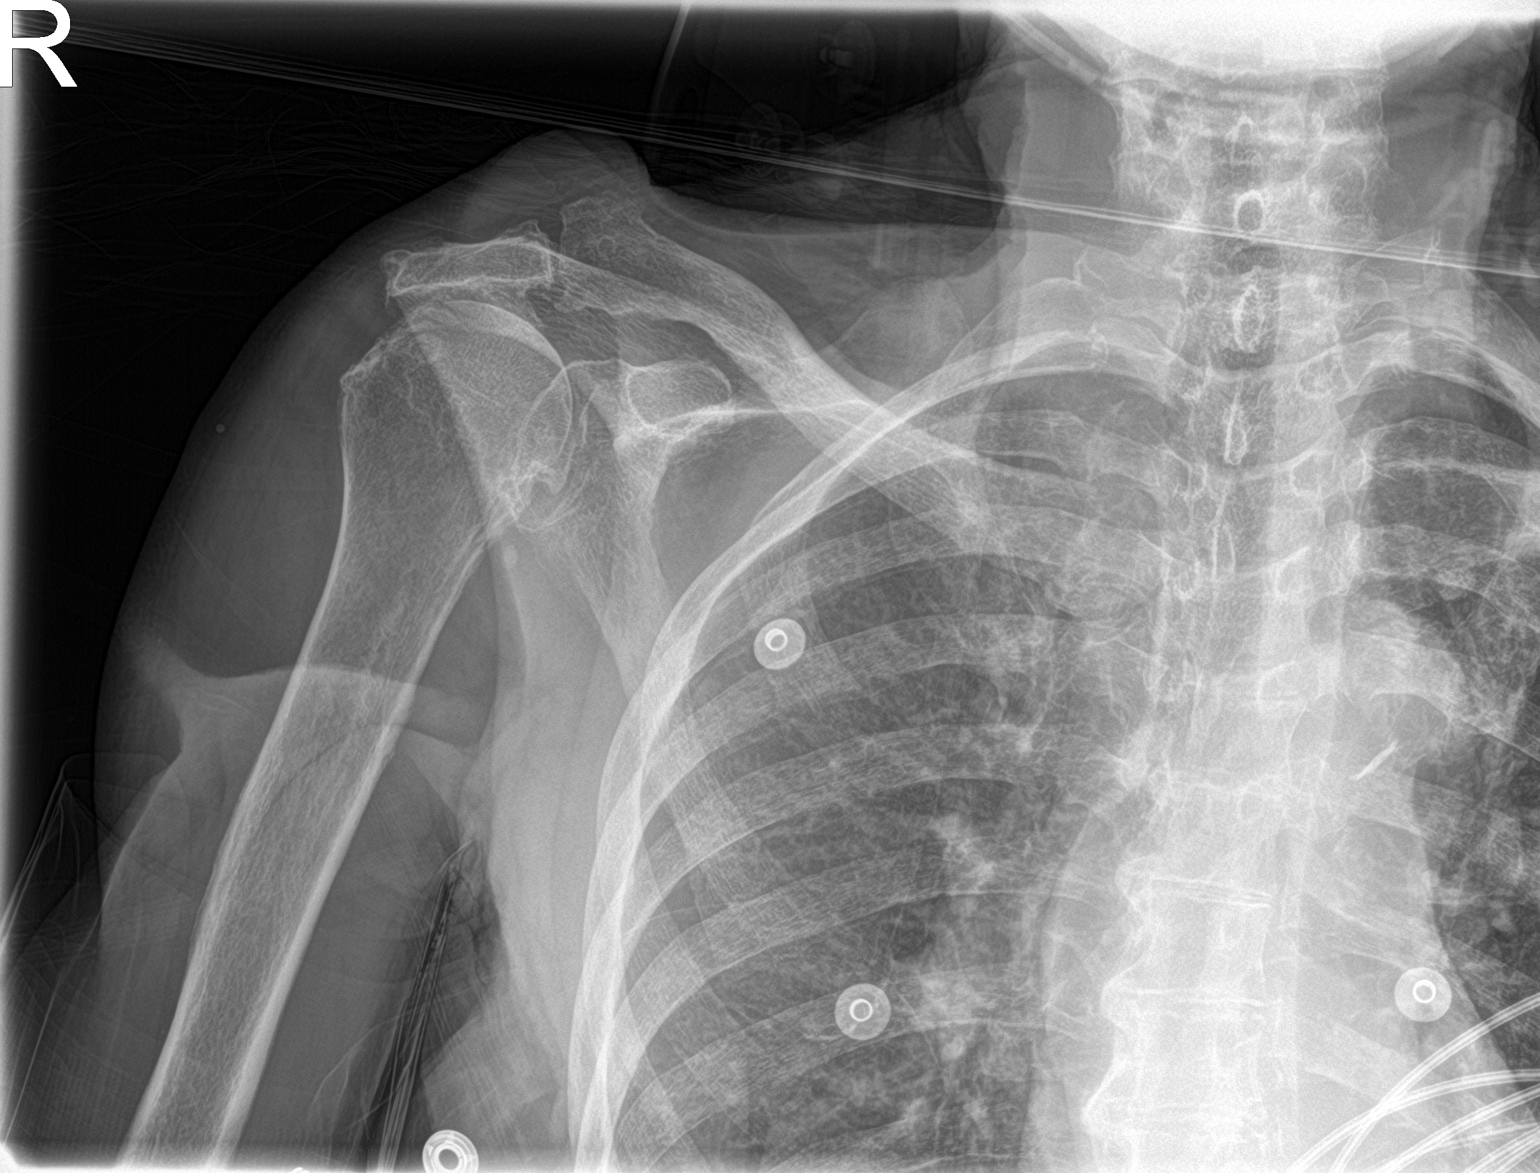

[shoulder y view]
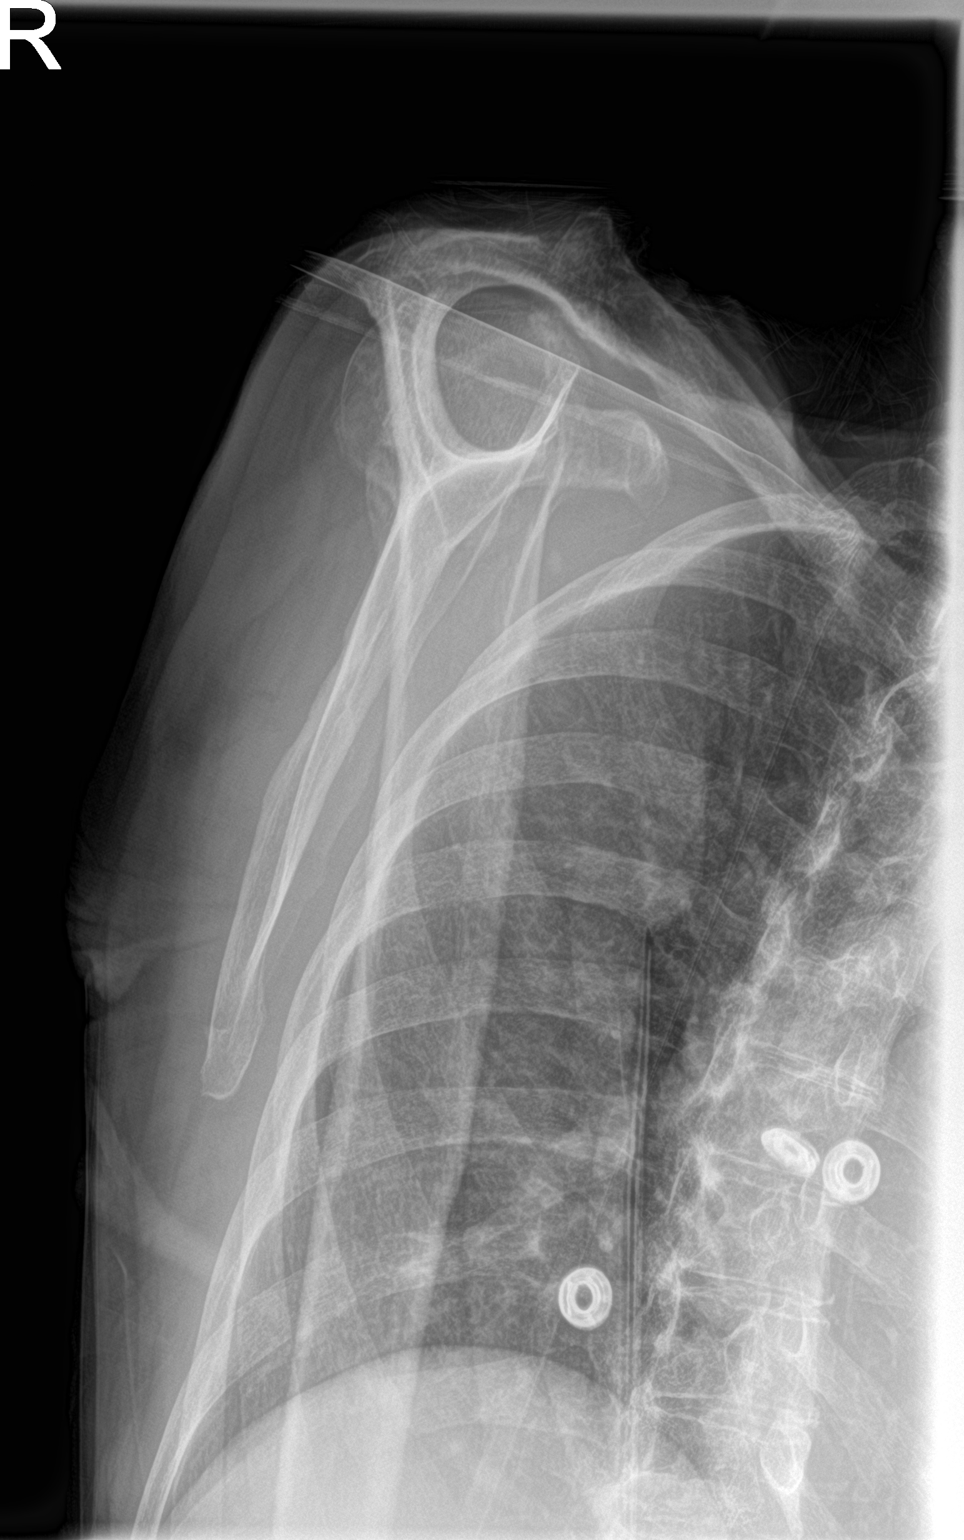

[2 of 2 positions shown; findings below may reference images not displayed]

FINDINGS: There is no evidence of an acute fracture or dislocation. Chronic
deformities are seen involving the inferolateral aspect of the right
humeral head. A chronic deformity of the greater tubercle is also
seen seen. Moderate to marked severity degenerative changes are seen
involving the right acromioclavicular joint and right glenohumeral
articulation. Soft tissues are unremarkable.
IMPRESSION: 1. Degenerative changes without evidence of an acute fracture or
dislocation.
2. Chronic deformities of the right humeral head and right greater
tubercle.

## 2021-04-17 IMAGING — CR DG RIBS W/ CHEST 3+V*R*
3 series · 3 of 3 positions shown · non-contrast
Comparison: None.

CLINICAL DATA: Unwitnessed fall.

EXAM:
RIGHT RIBS AND CHEST - 3+ VIEW

[chest pa]
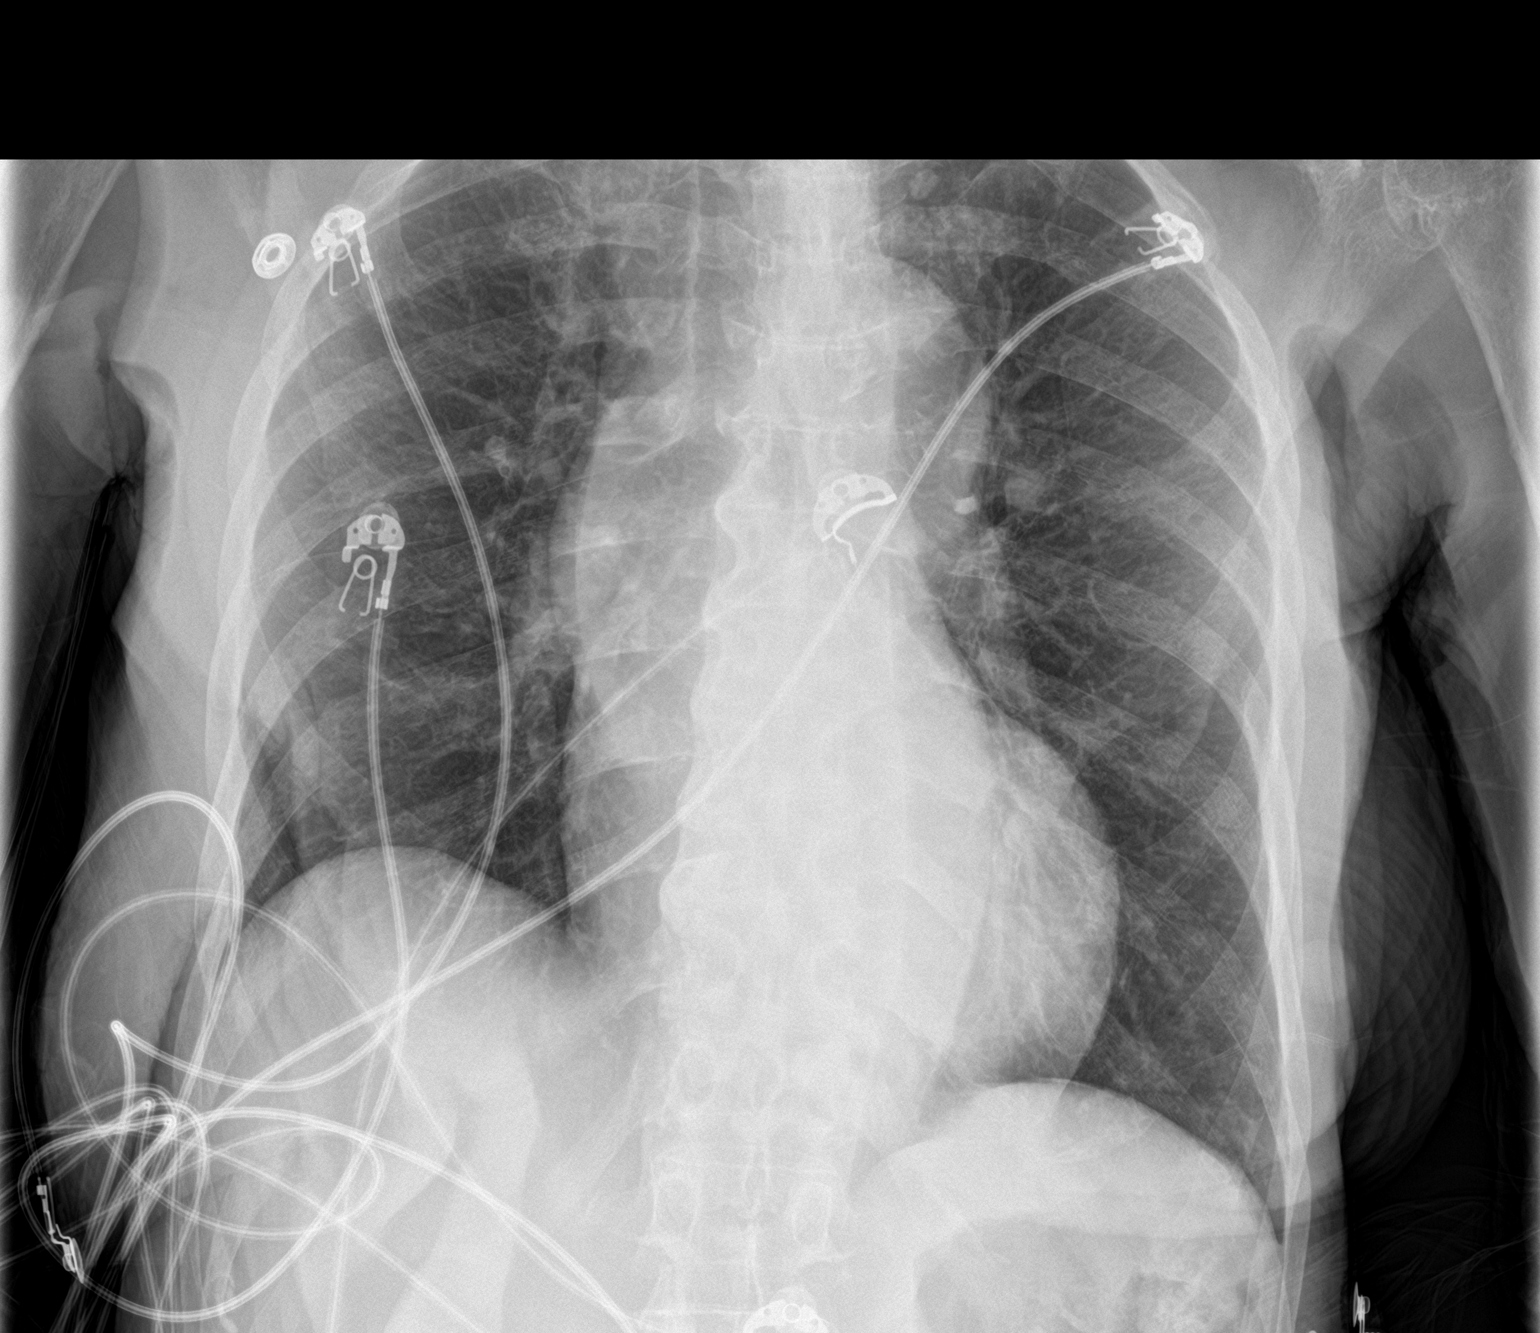

[rib pa]
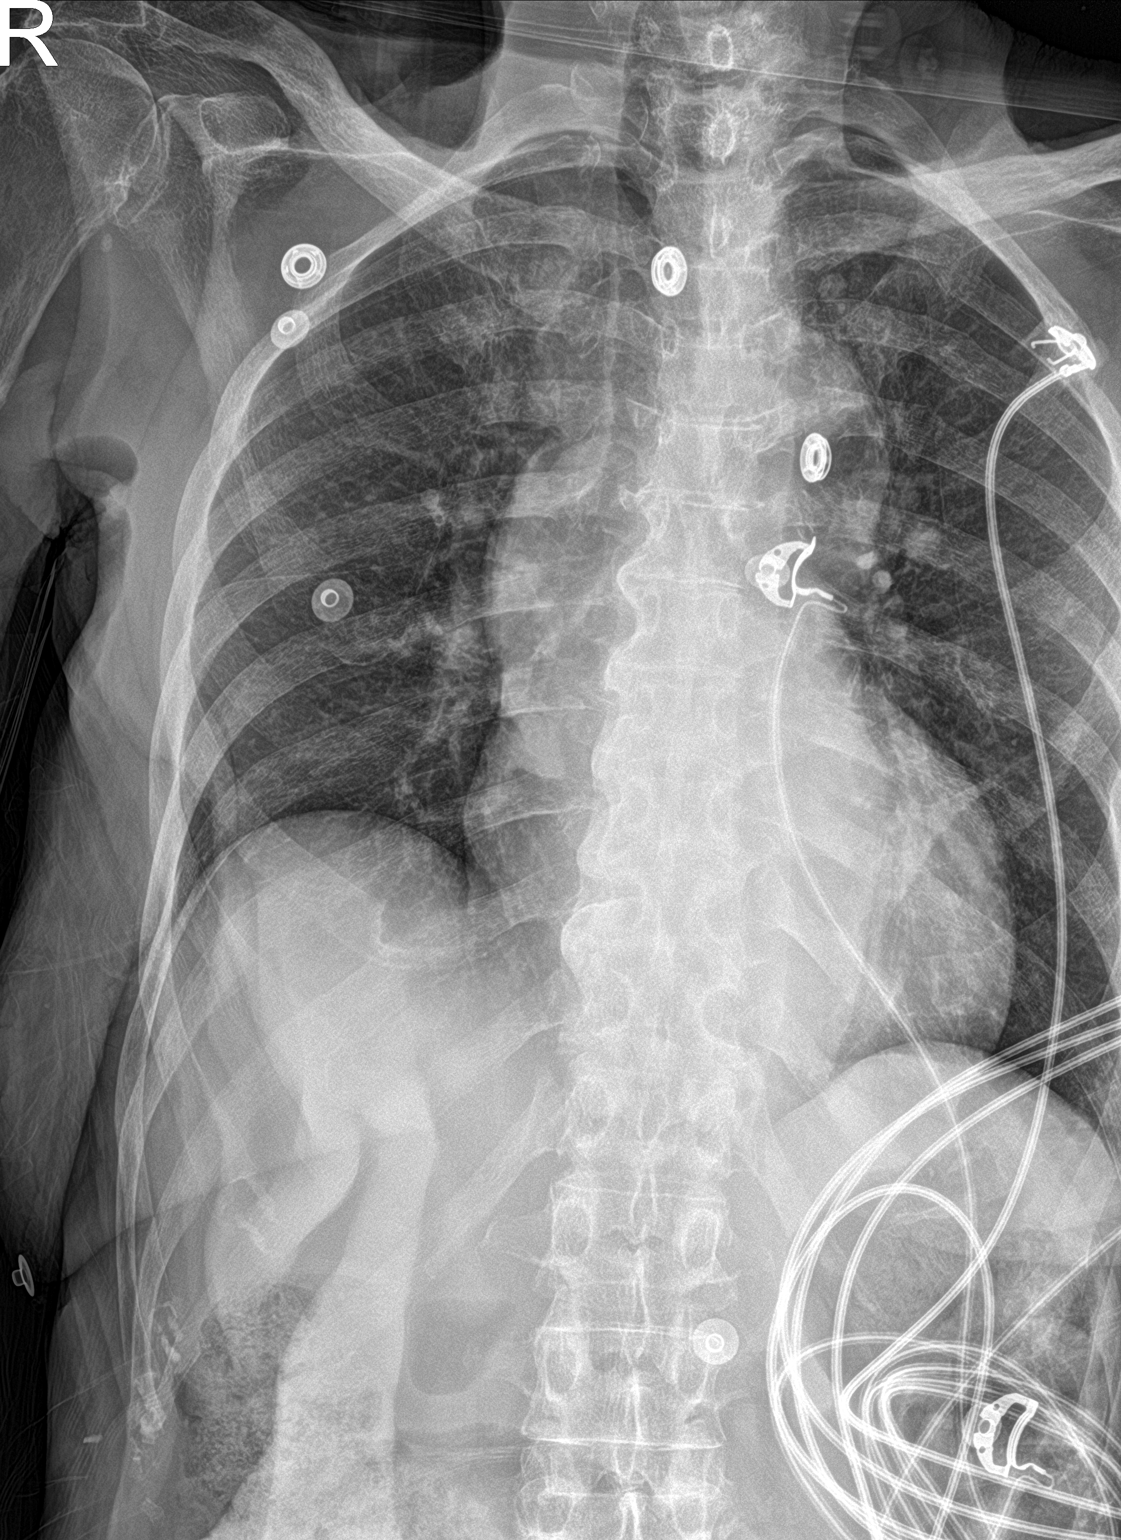

[rib ap obl]
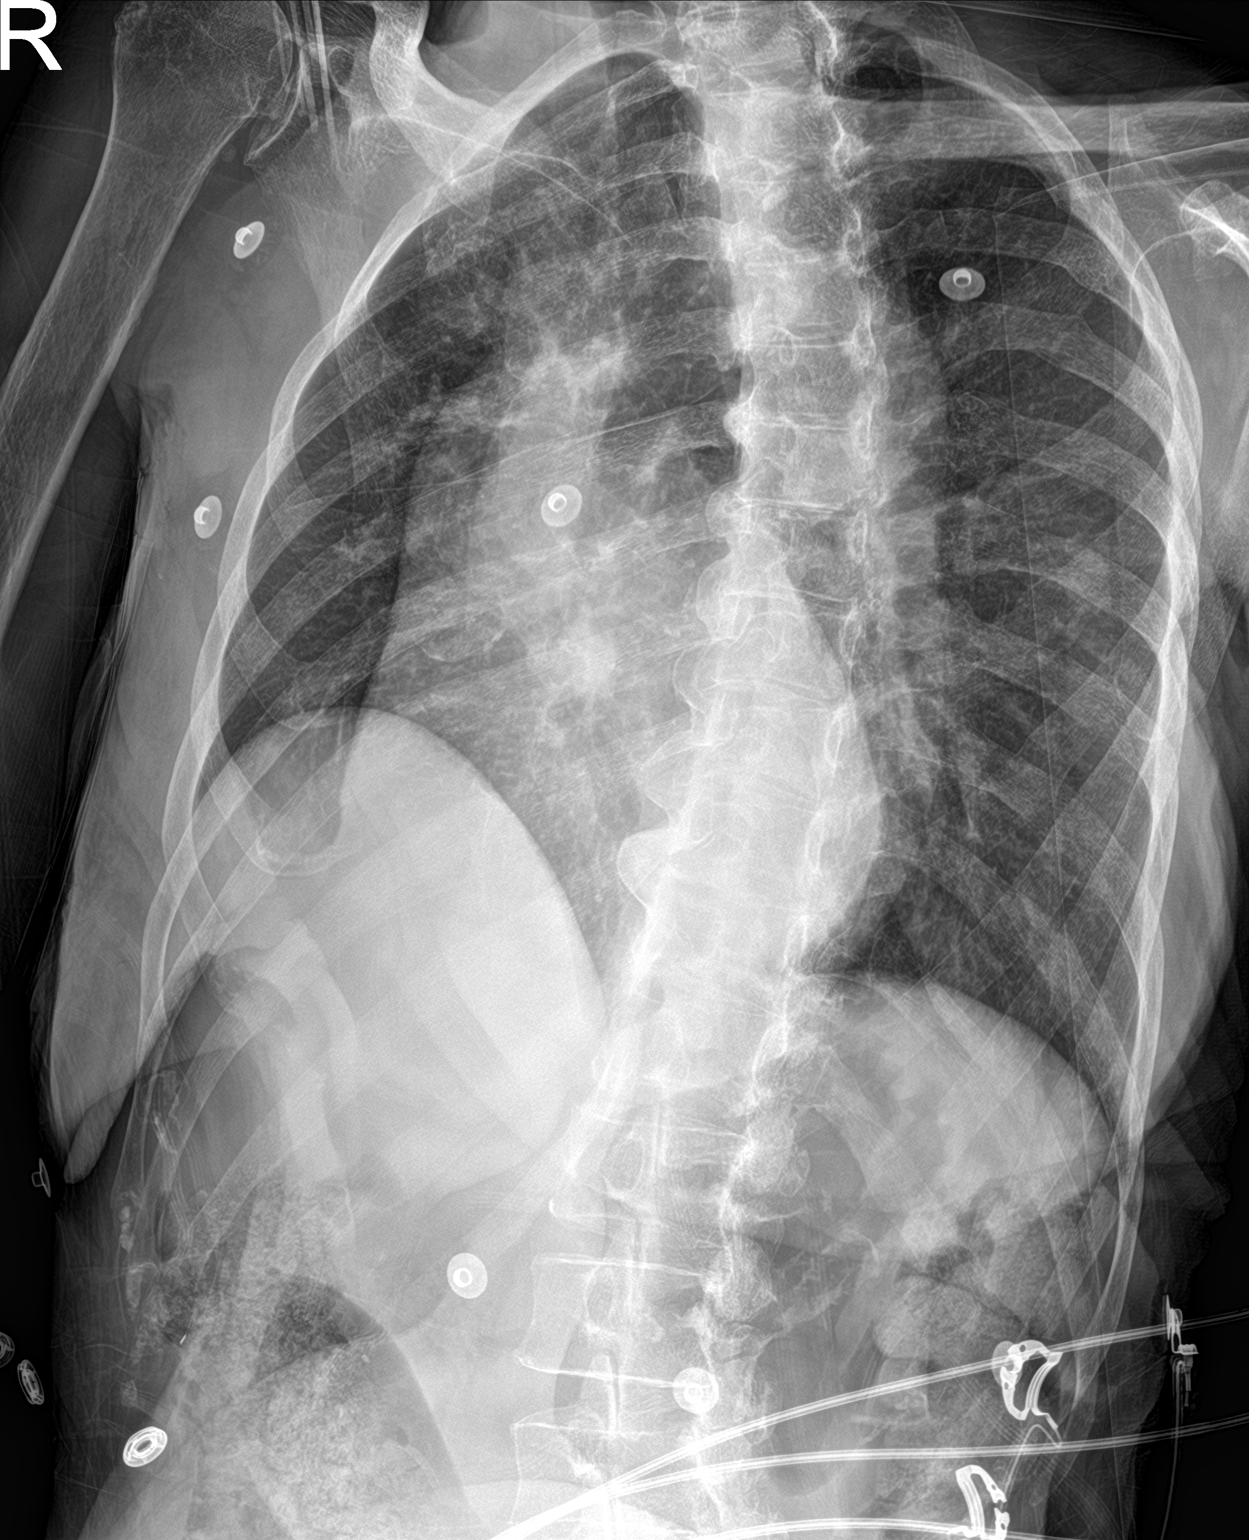

[3 of 3 positions shown; findings below may reference images not displayed]

FINDINGS: No fracture or other bone lesions are seen involving the ribs.
Degenerative changes are seen throughout the thoracic spine. There
is no evidence of pneumothorax or pleural effusion. Both lungs are
clear. Heart size and mediastinal contours are within normal limits.
IMPRESSION: No acute osseous abnormality.

## 2021-04-17 IMAGING — CR DG KNEE COMPLETE 4+V*L*
4 series · 4 of 4 positions shown · non-contrast
Comparison: None.

CLINICAL DATA: Unwitnessed fall.

EXAM:
LEFT KNEE - COMPLETE 4+ VIEW

[knee ap]
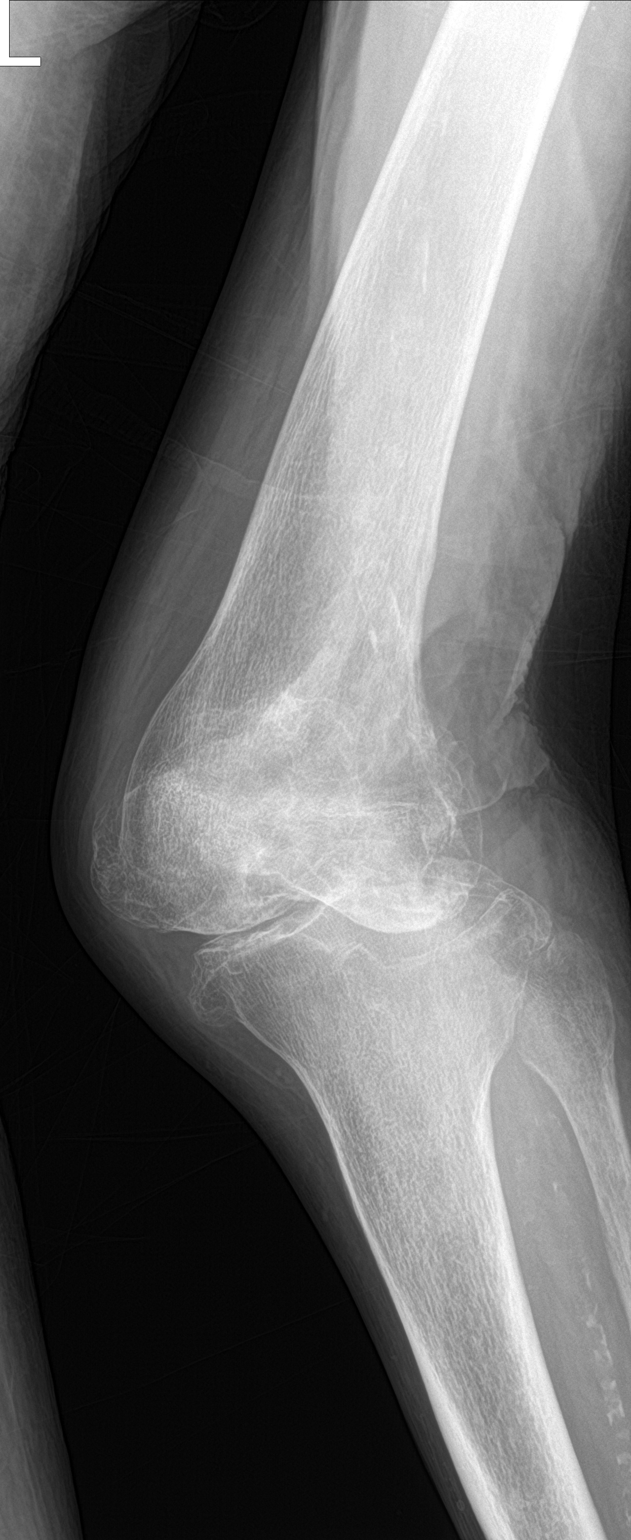

[knee obl (1 of 2)]
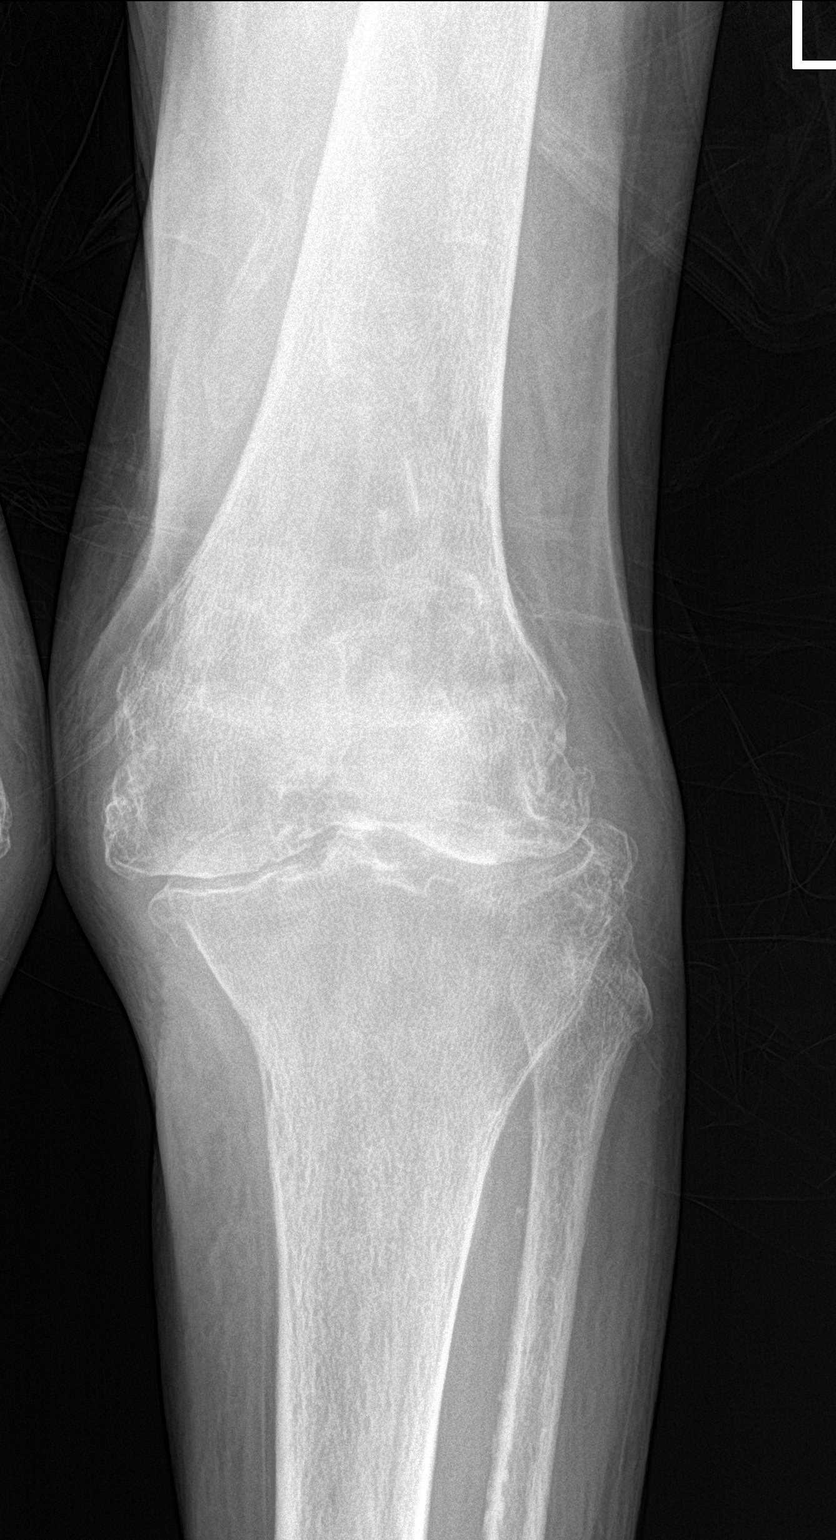

[knee obl (2 of 2)]
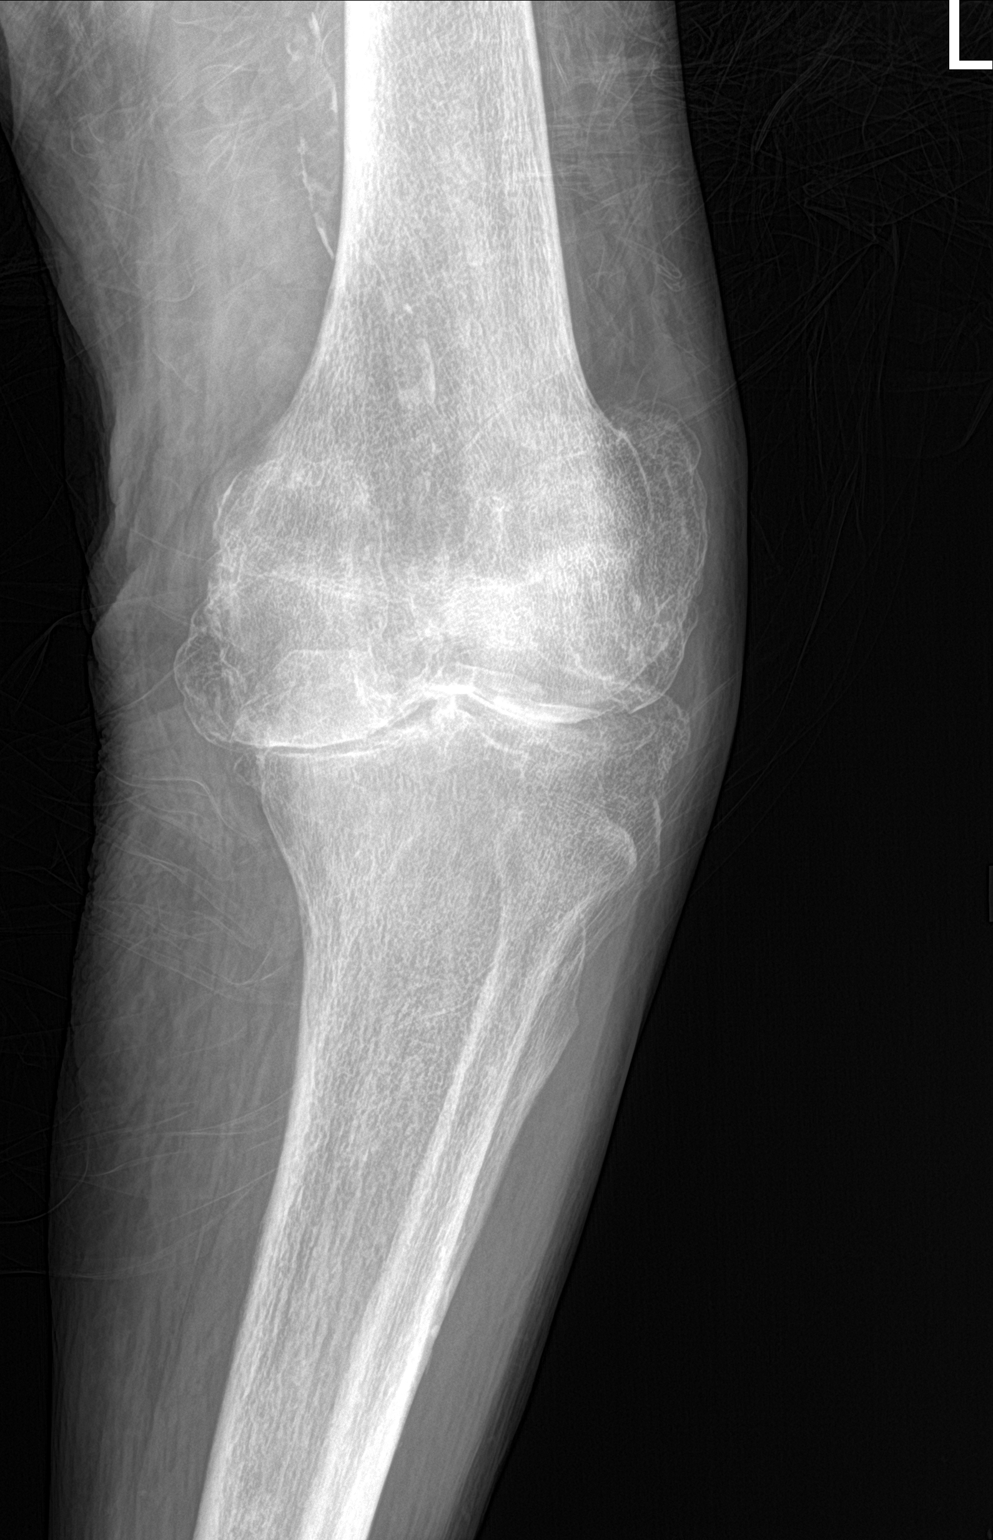

[knee lat]
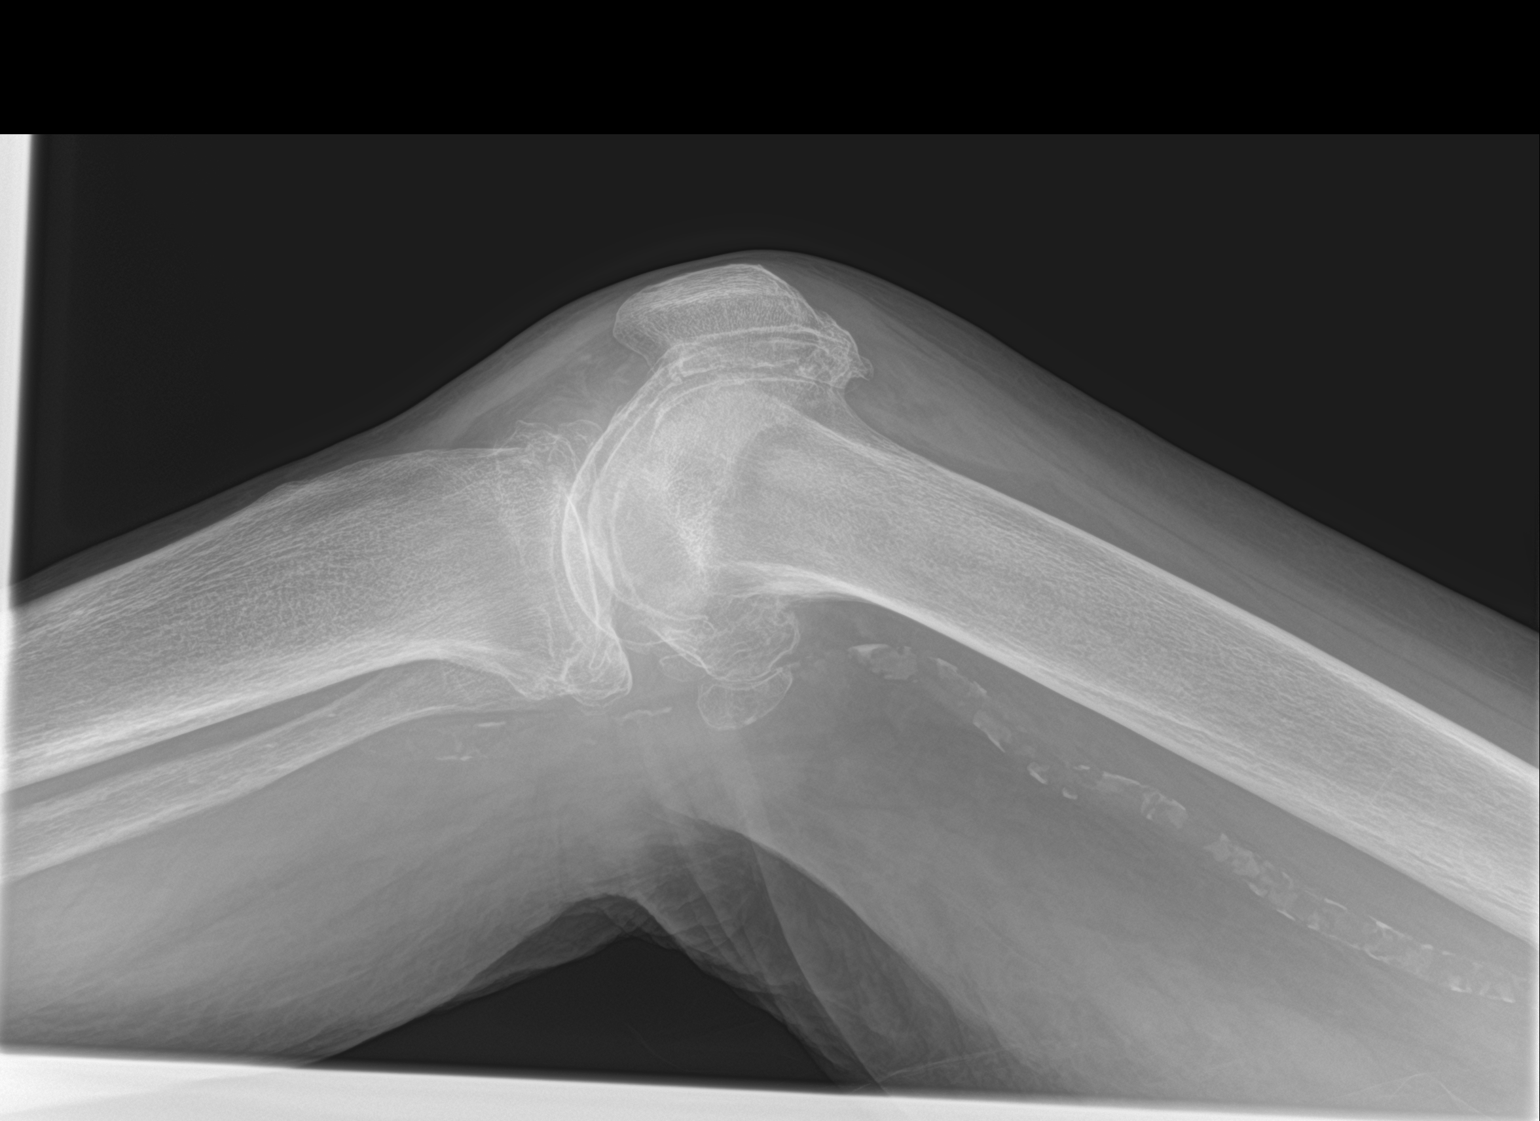

[4 of 4 positions shown; findings below may reference images not displayed]

FINDINGS: No evidence of an acute fracture or dislocation. Marked severity
narrowing of the medial and lateral tibiofemoral compartment spaces
is seen. Marked severity patellofemoral narrowing is also noted.
Marked severity vascular calcification is seen. There is a small
joint effusion.
IMPRESSION: 1. No evidence of acute fracture or dislocation.
2. Severe tricompartmental degenerative changes.
3. Small joint effusion.

## 2021-04-17 IMAGING — CR DG PORTABLE PELVIS
1 series · 1 of 1 positions shown · non-contrast
Comparison: None.

CLINICAL DATA: Unwitnessed fall.

EXAM:
PORTABLE PELVIS 1-2 VIEWS

[pelvis ap]
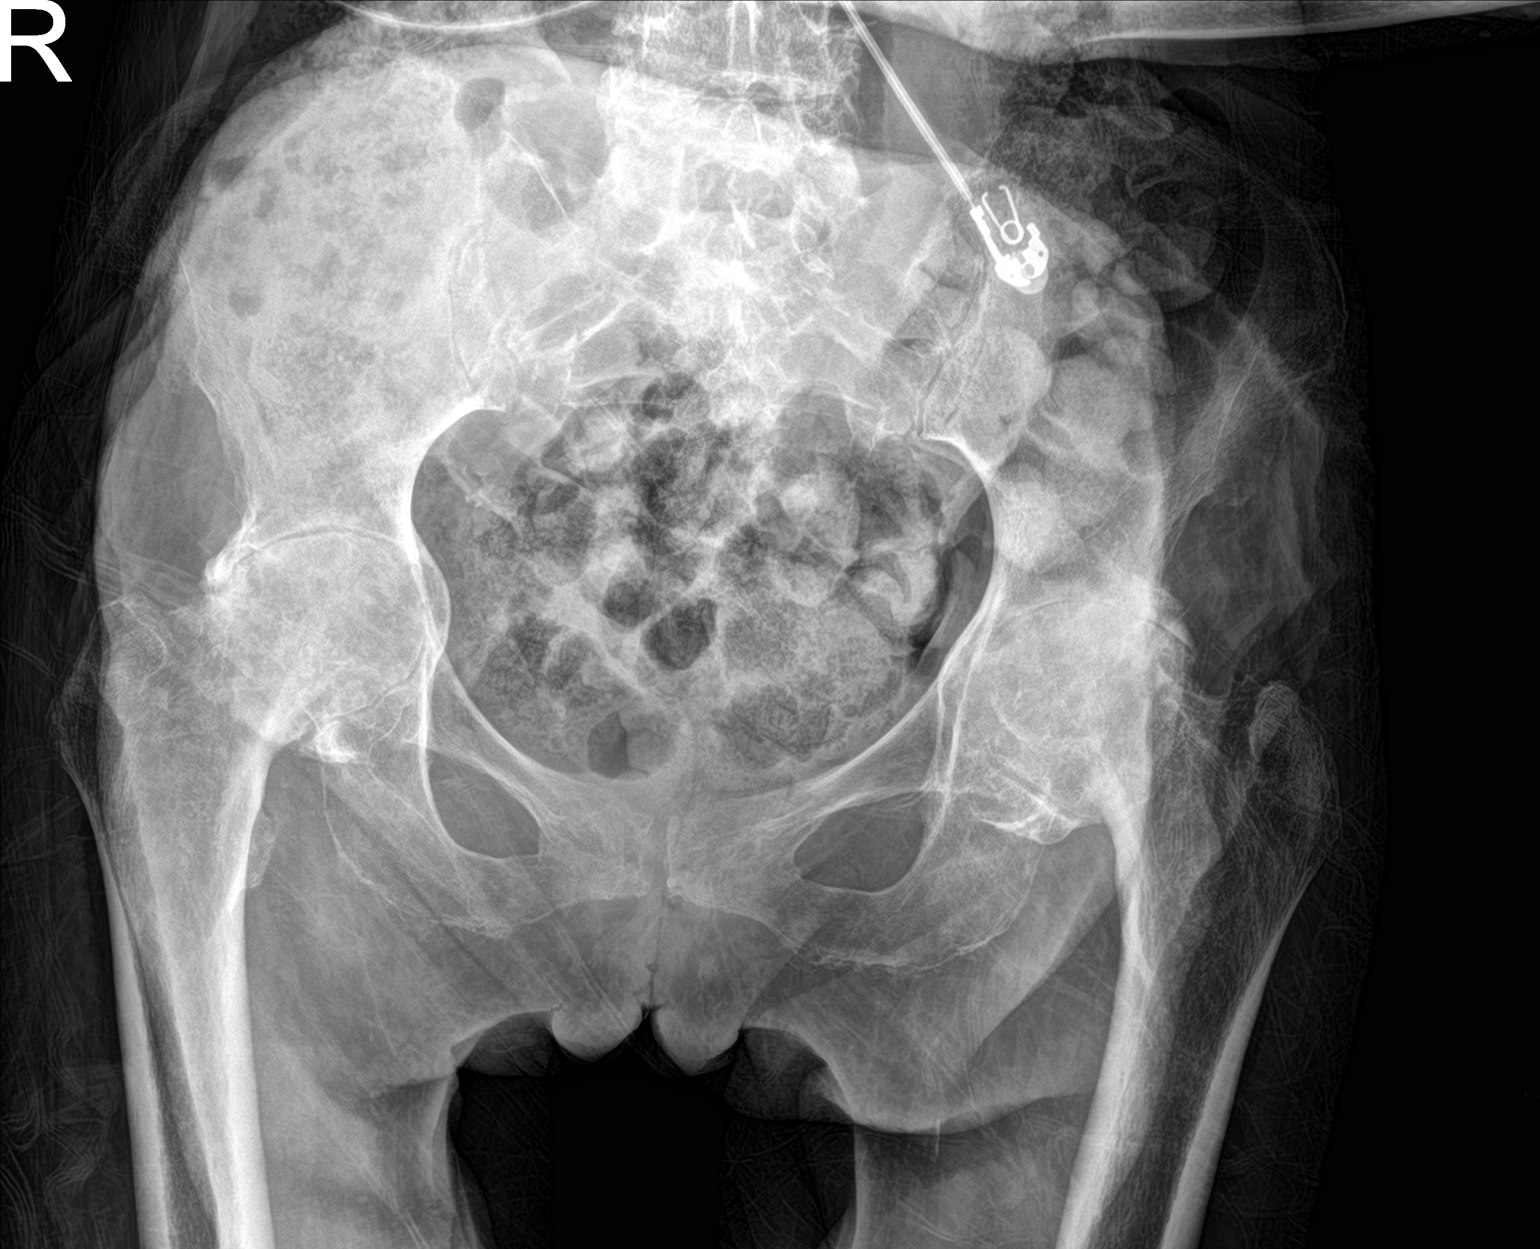

[1 of 1 positions shown; findings below may reference images not displayed]

FINDINGS: There is no evidence of an acute pelvic fracture or diastasis.
Marked severity degenerative changes are seen involving both hips,
in the form of joint space narrowing and acetabular sclerosis. A
large amount of stool is seen throughout the colon.
IMPRESSION: 1. No acute fracture or diastasis.
2. Marked severity degenerative changes of both hips.

## 2021-04-17 IMAGING — CT CT CERVICAL SPINE W/O CM
3 of 4 series · 10 of 33 positions shown, 12 images · non-contrast
Comparison: None.

CLINICAL DATA: Polytrauma.

EXAM:
CT HEAD WITHOUT CONTRAST
CT CERVICAL SPINE WITHOUT CONTRAST
TECHNIQUE: Multidetector CT imaging of the head and cervical spine was
performed following the standard protocol without intravenous
contrast. Multiplanar CT image reconstructions of the cervical spine
were also generated.

[Series 4: c_spine 2.0 st · axial · 0.31mm/px · z∈[+1024,+1090]mm · 2 of 100 slices shown, 3 images]
[im 34/100  soft-tissue]
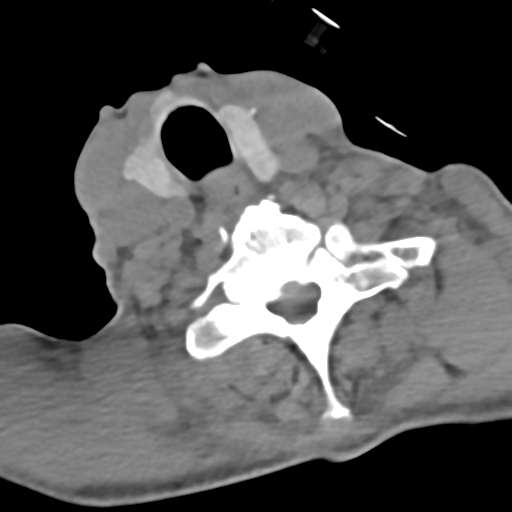
[im 34/100  bone]
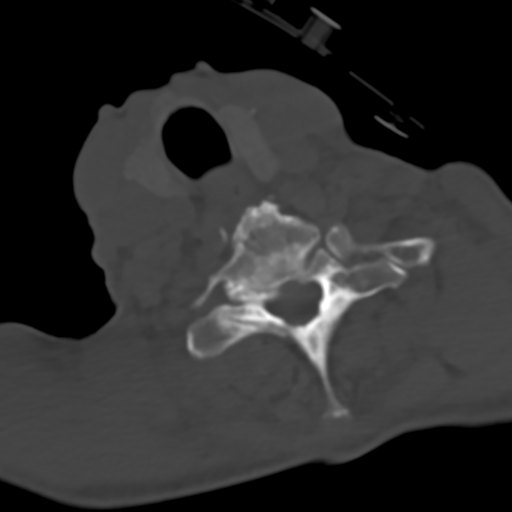
[im 67/100  bone]
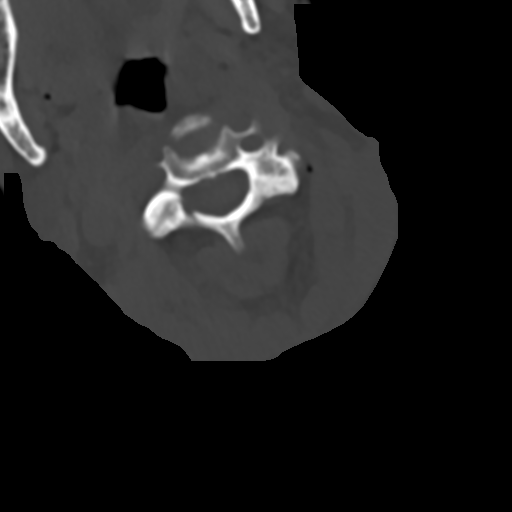

[Series 7: c_spine 2.0 sag bone · sagittal · 0.26mm/px · 5 of 46 slices shown, 6 images]
[im 16/46  bone]
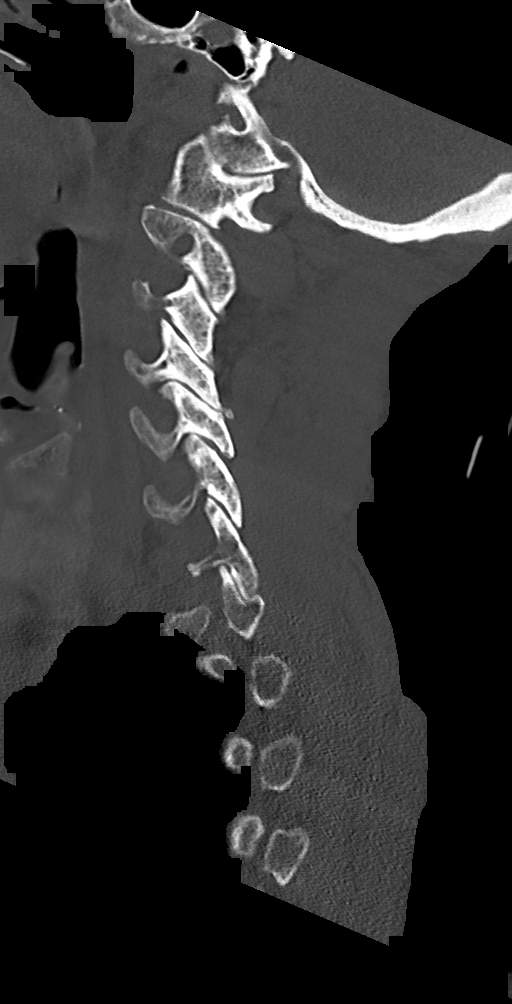
[im 19/46  bone]
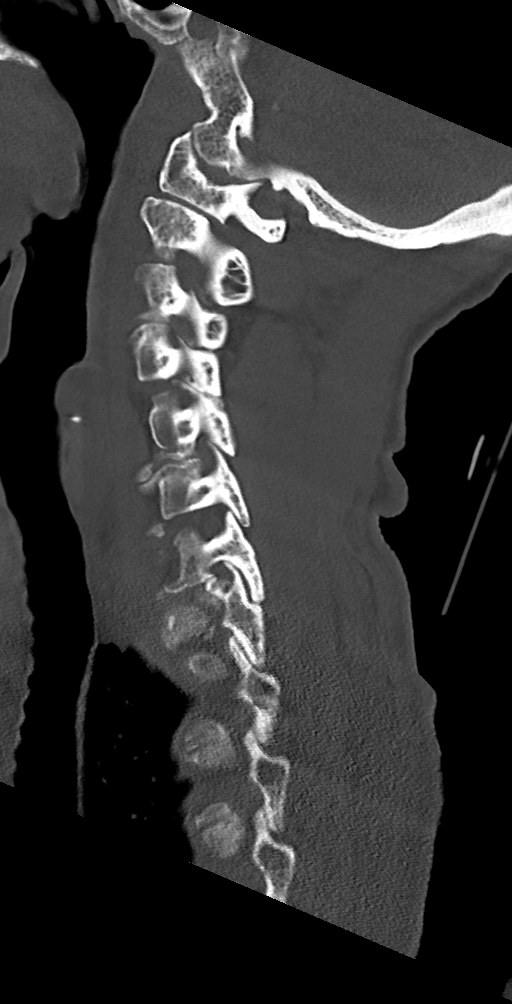
[im 23/46  soft-tissue]
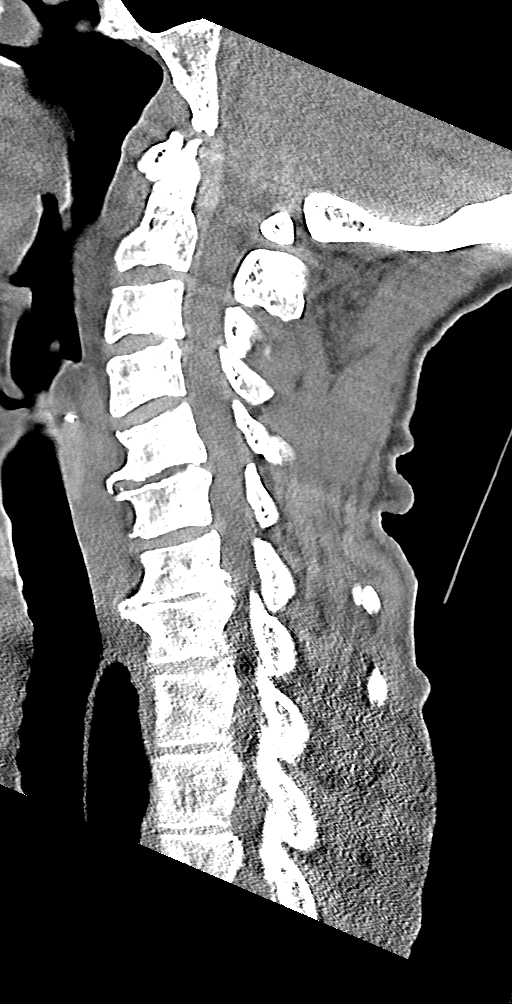
[im 23/46  bone]
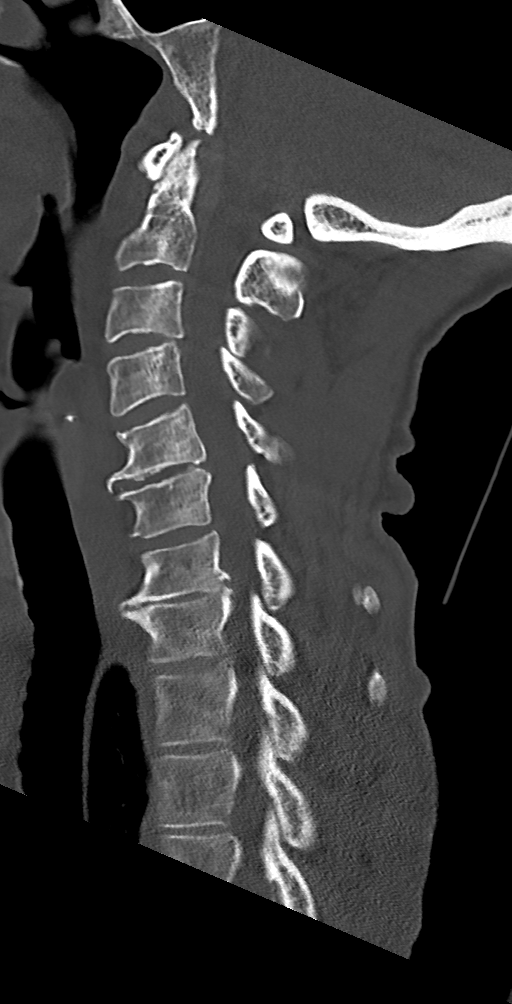
[im 27/46  bone]
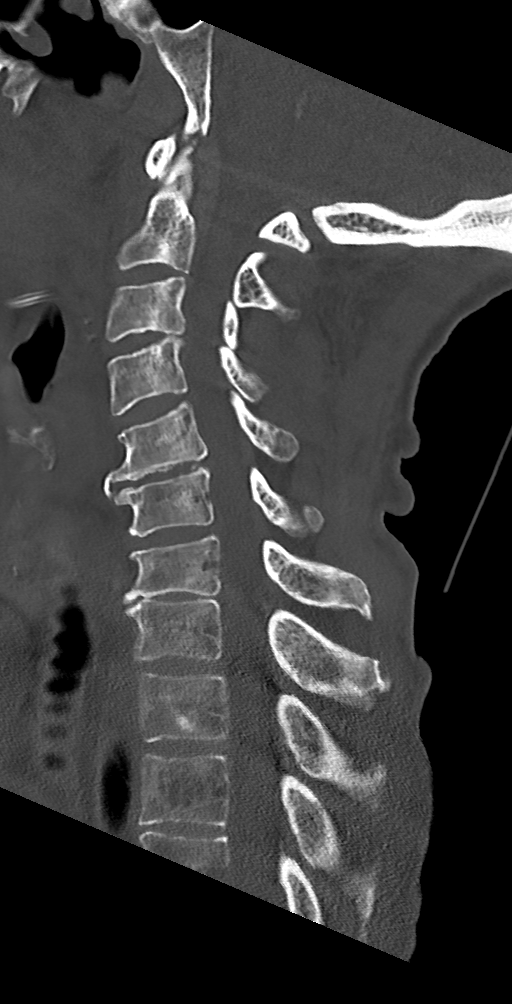
[im 31/46  bone]
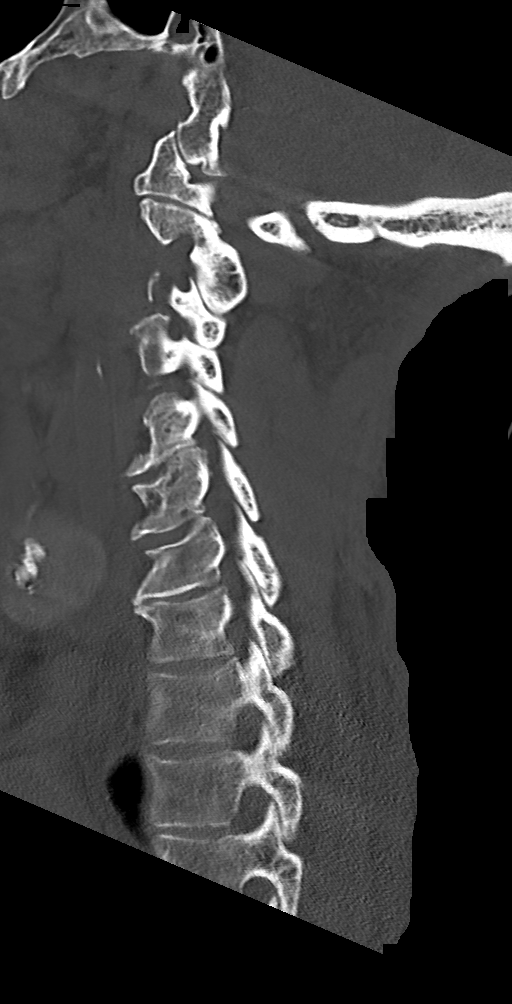

[Series 8: c_spine 2.0 cor bone · coronal · 0.29mm/px · 3 of 45 slices shown]
[im 9/45  bone]
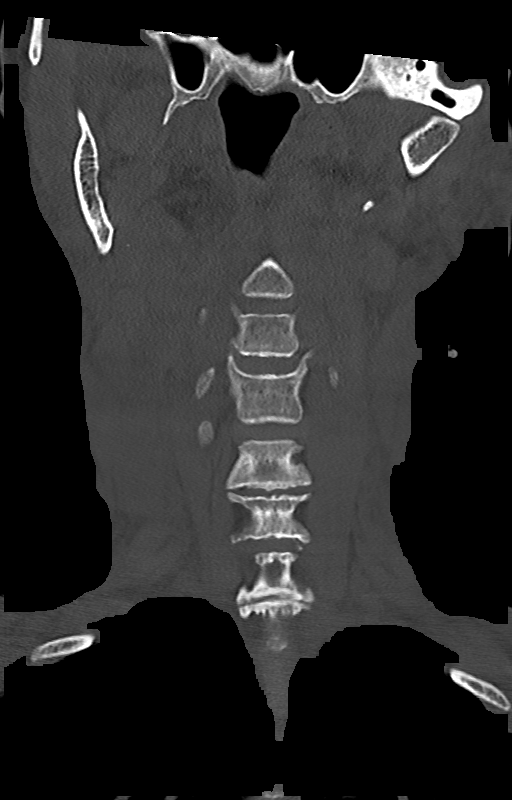
[im 18/45  bone]
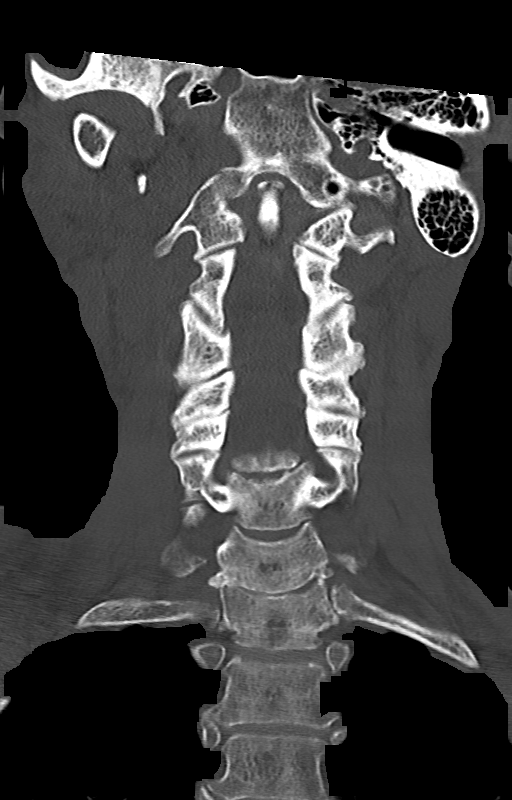
[im 27/45  bone]
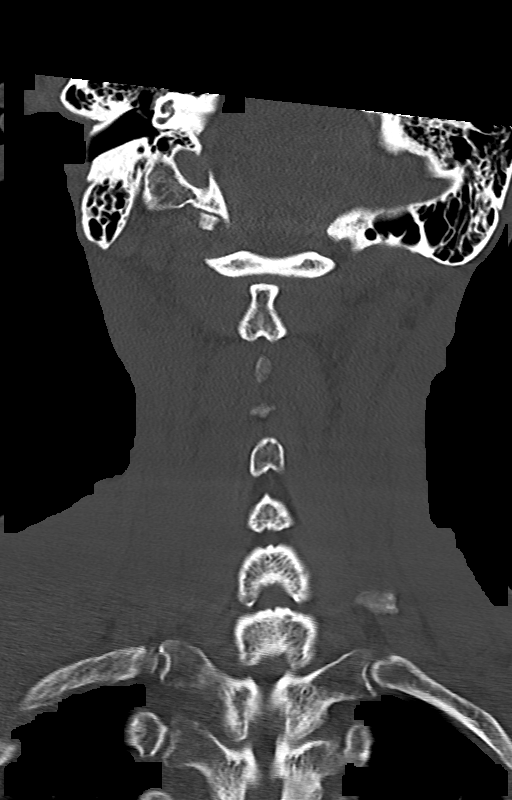

[10 of 33 positions shown; findings below may reference images not displayed]

FINDINGS: CT HEAD FINDINGS

Brain: No evidence of acute infarction, hemorrhage, hydrocephalus,
extra-axial collection or mass lesion/mass effect. There is mild
periventricular white matter hypodensity, likely chronic small
vessel ischemic change. There is mild diffuse atrophy.

Vascular: Atherosclerotic calcifications are present within the
cavernous internal carotid arteries.

Skull: Normal. Negative for fracture or focal lesion.

Sinuses/Orbits: No acute finding.

Other: There is right facial and right frontal soft tissue swelling.
There are punctate skin densities, likely calcifications of the face
bilaterally.

CT CERVICAL SPINE FINDINGS

Alignment: Normal.

Skull base and vertebrae: No acute fracture. No primary bone lesion
or focal pathologic process.

Soft tissues and spinal canal: No prevertebral fluid or swelling. No
visible canal hematoma.

Disc levels: There is disc space narrowing and endplate osteophyte
formation at C5-C6 and C7-T1 compatible with degenerative change.
There is scattered mild neural foraminal stenosis bilaterally. Disc
bulge at C7-T1 causes moderate central canal stenosis.

Upper chest: There is a 3 mm nodule in the left upper lobe image
5/90.

Other: There is a heavily calcified left thyroid nodule measuring 9
mm. There is a 5 mm hypodense left thyroid nodule.
IMPRESSION: 1.  No acute intracranial process.

2. No acute fracture or traumatic subluxation of the cervical spine.

3. 3 mm nodule in the left upper lobe. If patient is at low risk for
bronchus carcinoma no follow-up is necessary. The patient is at high
risk a follow-up CT is recommended in 12 months to reassess.

## 2021-04-17 MED ORDER — SODIUM CHLORIDE 0.9 % IV SOLN: INTRAVENOUS | Status: DC

## 2021-04-17 MED ORDER — LACTATED RINGERS IV SOLN: INTRAVENOUS | Status: DC

## 2021-04-17 MED ORDER — SODIUM CHLORIDE 0.9 % IV BOLUS
1000.0000 mL | Freq: Once | INTRAVENOUS | Status: AC
  Administered 2021-04-17: 1000 mL via INTRAVENOUS

## 2021-04-17 MED ORDER — SODIUM CHLORIDE 0.9 % IV SOLN
1.0000 g | Freq: Once | INTRAVENOUS | Status: AC
  Administered 2021-04-17: 1 g via INTRAVENOUS
  Filled 2021-04-17: qty 10

## 2021-04-17 NOTE — Assessment & Plan Note (Signed)
Check A1c -SSI 

## 2021-04-17 NOTE — Assessment & Plan Note (Addendum)
Present on admission. Will need wound care to evaluate. Likely caused by her nearly 60 hour stay on the floor of her home.

## 2021-04-17 NOTE — H&P (Signed)
History and Physical    Penny [name] VZD:[id number] DOB: [birth year] DOA: 04/17/2021  PCP: Kelton Pillar, MD   Patient coming from: Home  I have personally briefly reviewed patient's old medical records in [place]  CC: found on floor HPI: 86 year old African-American female with a history of type 2 diabetes, history of vascular disease, CKD stage III who presents to the ER today after being found on the floor by her neighbor.  There is no family or neighbor at the bedside.  Patient is confused and unable to give any history review of systems.  HPI obtained from the chart.  Reportedly patient was found by her neighbor on the floor today.  Last time the patient was seen was on Saturday which was April 15, 2021.  Patient reportedly incontinent of urine.  Reportedly the patient fell from her chair when she was trying to get up to the kitchen to get some food.  When she arrived to the ER, she was hypothermic with a core temp of 94.6.  Patient placed on a [employer].  Core temp is still 94 degrees.  Work-up in the ER showed no evidence of rhabdomyolysis surprisingly.  CK was 351.  The rest of her laboratory evaluation was truly unremarkable.  CT head and C-spine are negative for fracture.  No fractures of the rib, humerus, shoulder, knees, pelvic girdle were seen.  Large amount of stool noted in the colon.  Due to continued hypothermia, [facility] contacted for admission.   ED Course: noted to be hypothermic on arrival to ER with core temp 94.6.  despite being in the ground for nearly 60 hours, pt's CK only 351.  Pt hydrated with IVF. Remains hypothermic with bair hugger.  Review of Systems:  Review of Systems  Unable to perform ROS: Mental status change   Past Medical History:  Diagnosis Date   CKD (chronic kidney disease) stage 3, GFR 30-59 ml/min ([place]) 04/17/2021    No past surgical history on file.   reports that she has never smoked. She has never used  smokeless tobacco. She reports that she does not drink alcohol and does not use drugs.  No Known Allergies  No family history on file.  Prior to Admission medications   Medication Sig Start Date End Date Taking? Authorizing Provider  aspirin 81 MG tablet Take 81 mg by mouth daily.    [provider]  clobetasol cream (TEMOVATE) 0.05 %  05/15/19   [provider]  diltiazem (CARDIZEM CD) 240 MG 24 hr capsule Take 240 mg by mouth daily. 08/29/15   [provider]  glipiZIDE (GLUCOTROL XL) 5 MG 24 hr tablet  08/19/15   [provider]  hydrochlorothiazide (HYDRODIURIL) 25 MG tablet Take 25 mg by mouth daily. 05/15/19   [provider]  metFORMIN (GLUCOPHAGE) 500 MG tablet  10/01/15   [provider]  olmesartan-hydrochlorothiazide [facility] HCT) 40-25 MG tablet  09/21/15   [provider]    Physical Exam: Vitals:   04/17/21 2200 04/17/21 2215 04/17/21 2330 04/17/21 2336  BP: (!) 180/90 (!) 173/93 (!) 184/100   Pulse: (!) 59 60 63   Resp: 12 14 14    Temp:    (!) 94.1 F (34.5 C)  TempSrc:    Rectal  SpO2: 98% 99% 93%     Physical Exam Vitals and nursing note reviewed.  Constitutional:      General: She is not in acute distress.    Appearance: She is not  toxic-appearing.     Comments: Appears chronically ill. Smells of stale urine. Pt is disoriented. Not aware she is in the hospital. Unable to give month, day or year.  HENT:     Head: Normocephalic.     Nose: Nose normal.  Eyes:     General:        Right eye: No discharge.        Left eye: No discharge.  Cardiovascular:     Rate and Rhythm: Normal rate and regular rhythm.     Pulses: Normal pulses.  Pulmonary:     Effort: Pulmonary effort is normal. No respiratory distress.     Breath sounds: No wheezing or rales.  Abdominal:     General: Bowel sounds are normal. There is no distension.     Tenderness: There is no abdominal tenderness. There is no guarding or  rebound.  Musculoskeletal:     Right lower leg: No edema.     Left lower leg: No edema.  Skin:    Comments: Pressure ulcer on right posterior shoulder and right greater trochanter area. Both ulcers were present on admission. Right shoulder ulcer possibly stage 2 or unstageable. Right greater trochanter ulcer is unstageable.  The majority of the rest of her skin is dry and woody.  Neurological:     Mental Status: She is disoriented.     Labs on Admission: I have personally reviewed following labs and imaging studies  CBC: Recent Labs  Lab 04/17/21 2208  WBC 6.7  NEUTROABS 5.7  HGB 12.0  HCT 37.8  MCV 98.7  PLT 638   Basic Metabolic Panel: Recent Labs  Lab 04/17/21 2208  NA 139  K 3.5  CL 103  CO2 26  GLUCOSE 106*  BUN 27*  CREATININE 1.06*  CALCIUM 9.1   GFR: CrCl cannot be calculated (Unknown ideal weight.). Liver Function Tests: Recent Labs  Lab 04/17/21 2208  AST 40  ALT 22  ALKPHOS 60  BILITOT 1.3*  PROT 7.7  ALBUMIN 2.7*   No results for input(s): LIPASE, AMYLASE in the last 168 hours. No results for input(s): AMMONIA in the last 168 hours. Coagulation Profile: Recent Labs  Lab 04/17/21 2208  INR 1.0   Cardiac Enzymes: Recent Labs  Lab 04/17/21 2208  CKTOTAL 351*   BNP (last 3 results) No results for input(s): PROBNP in the last 8760 hours. HbA1C: No results for input(s): HGBA1C in the last 72 hours. CBG: Recent Labs  Lab 04/17/21 2321  GLUCAP 87   Lipid Profile: No results for input(s): CHOL, HDL, LDLCALC, TRIG, CHOLHDL, LDLDIRECT in the last 72 hours. Thyroid Function Tests: No results for input(s): TSH, T4TOTAL, FREET4, T3FREE, THYROIDAB in the last 72 hours. Anemia Panel: No results for input(s): VITAMINB12, FOLATE, FERRITIN, TIBC, IRON, RETICCTPCT in the last 72 hours. Urine analysis: No results found for: COLORURINE, APPEARANCEUR, LABSPEC, [place], GLUCOSEU, HGBUR, BILIRUBINUR, KETONESUR, PROTEINUR, UROBILINOGEN, NITRITE,  LEUKOCYTESUR  Radiological Exams on Admission: I have personally reviewed images DG Ribs Unilateral W/Chest Right  Result Date: 04/17/2021 CLINICAL DATA:  Unwitnessed fall. EXAM: RIGHT RIBS AND CHEST - 3+ VIEW COMPARISON:  None. FINDINGS: No fracture or other bone lesions are seen involving the ribs. Degenerative changes are seen throughout the thoracic spine. There is no evidence of pneumothorax or pleural effusion. Both lungs are clear. Heart size and mediastinal contours are within normal limits. IMPRESSION: No acute osseous abnormality. Electronically Signed   By: Virgina Norfolk M.D.   On: 04/17/2021 23:30  DG Shoulder Right  Result Date: 04/17/2021 CLINICAL DATA:  Unwitnessed fall. EXAM: RIGHT SHOULDER - 2+ VIEW COMPARISON:  None. FINDINGS: There is no evidence of an acute fracture or dislocation. Chronic deformities are seen involving the inferolateral aspect of the right humeral head. A chronic deformity of the greater tubercle is also seen seen. Moderate to marked severity degenerative changes are seen involving the right acromioclavicular joint and right glenohumeral articulation. Soft tissues are unremarkable. IMPRESSION: 1. Degenerative changes without evidence of an acute fracture or dislocation. 2. Chronic deformities of the right humeral head and right greater tubercle. Electronically Signed   By: Virgina Norfolk M.D.   On: 04/17/2021 23:31   CT Head Wo Contrast  Result Date: 04/17/2021 CLINICAL DATA:  Polytrauma. EXAM: CT HEAD WITHOUT CONTRAST CT CERVICAL SPINE WITHOUT CONTRAST TECHNIQUE: Multidetector CT imaging of the head and cervical spine was performed following the standard protocol without intravenous contrast. Multiplanar CT image reconstructions of the cervical spine were also generated. COMPARISON:  None. FINDINGS: CT HEAD FINDINGS Brain: No evidence of acute infarction, hemorrhage, hydrocephalus, extra-axial collection or mass lesion/mass effect. There is mild  periventricular white matter hypodensity, likely chronic small vessel ischemic change. There is mild diffuse atrophy. Vascular: Atherosclerotic calcifications are present within the cavernous internal carotid arteries. Skull: Normal. Negative for fracture or focal lesion. Sinuses/Orbits: No acute finding. Other: There is right facial and right frontal soft tissue swelling. There are punctate skin densities, likely calcifications of the face bilaterally. CT CERVICAL SPINE FINDINGS Alignment: Normal. Skull base and vertebrae: No acute fracture. No primary bone lesion or focal pathologic process. Soft tissues and spinal canal: No prevertebral fluid or swelling. No visible canal hematoma. Disc levels: There is disc space narrowing and endplate osteophyte formation at C5-C6 and C7-T1 compatible with degenerative change. There is scattered mild neural foraminal stenosis bilaterally. Disc bulge at C7-T1 causes moderate central canal stenosis. Upper chest: There is a 3 mm nodule in the left upper lobe image 5/90. Other: There is a heavily calcified left thyroid nodule measuring 9 mm. There is a 5 mm hypodense left thyroid nodule. IMPRESSION: 1.  No acute intracranial process. 2. No acute fracture or traumatic subluxation of the cervical spine. 3. 3 mm nodule in the left upper lobe. If patient is at low risk for bronchus carcinoma no follow-up is necessary. The patient is at high risk a follow-up CT is recommended in 12 months to reassess. Electronically Signed   By: Ronney Asters M.D.   On: 04/17/2021 22:52   CT Cervical Spine Wo Contrast  Result Date: 04/17/2021 CLINICAL DATA:  Polytrauma. EXAM: CT HEAD WITHOUT CONTRAST CT CERVICAL SPINE WITHOUT CONTRAST TECHNIQUE: Multidetector CT imaging of the head and cervical spine was performed following the standard protocol without intravenous contrast. Multiplanar CT image reconstructions of the cervical spine were also generated. COMPARISON:  None. FINDINGS: CT HEAD FINDINGS  Brain: No evidence of acute infarction, hemorrhage, hydrocephalus, extra-axial collection or mass lesion/mass effect. There is mild periventricular white matter hypodensity, likely chronic small vessel ischemic change. There is mild diffuse atrophy. Vascular: Atherosclerotic calcifications are present within the cavernous internal carotid arteries. Skull: Normal. Negative for fracture or focal lesion. Sinuses/Orbits: No acute finding. Other: There is right facial and right frontal soft tissue swelling. There are punctate skin densities, likely calcifications of the face bilaterally. CT CERVICAL SPINE FINDINGS Alignment: Normal. Skull base and vertebrae: No acute fracture. No primary bone lesion or focal pathologic process. Soft tissues and spinal canal: No prevertebral fluid  or swelling. No visible canal hematoma. Disc levels: There is disc space narrowing and endplate osteophyte formation at C5-C6 and C7-T1 compatible with degenerative change. There is scattered mild neural foraminal stenosis bilaterally. Disc bulge at C7-T1 causes moderate central canal stenosis. Upper chest: There is a 3 mm nodule in the left upper lobe image 5/90. Other: There is a heavily calcified left thyroid nodule measuring 9 mm. There is a 5 mm hypodense left thyroid nodule. IMPRESSION: 1.  No acute intracranial process. 2. No acute fracture or traumatic subluxation of the cervical spine. 3. 3 mm nodule in the left upper lobe. If patient is at low risk for bronchus carcinoma no follow-up is necessary. The patient is at high risk a follow-up CT is recommended in 12 months to reassess. Electronically Signed   By: Ronney Asters M.D.   On: 04/17/2021 22:52   DG Pelvis Portable  Result Date: 04/17/2021 CLINICAL DATA:  Unwitnessed fall. EXAM: PORTABLE PELVIS 1-2 VIEWS COMPARISON:  None. FINDINGS: There is no evidence of an acute pelvic fracture or diastasis. Marked severity degenerative changes are seen involving both hips, in the form of  joint space narrowing and acetabular sclerosis. A large amount of stool is seen throughout the colon. IMPRESSION: 1. No acute fracture or diastasis. 2. Marked severity degenerative changes of both hips. Electronically Signed   By: Virgina Norfolk M.D.   On: 04/17/2021 23:25   DG Knee Complete 4 Views Left  Result Date: 04/17/2021 CLINICAL DATA:  Unwitnessed fall. EXAM: LEFT KNEE - COMPLETE 4+ VIEW COMPARISON:  None. FINDINGS: No evidence of an acute fracture or dislocation. Marked severity narrowing of the medial and lateral tibiofemoral compartment spaces is seen. Marked severity patellofemoral narrowing is also noted. Marked severity vascular calcification is seen. There is a small joint effusion. IMPRESSION: 1. No evidence of acute fracture or dislocation. 2. Severe tricompartmental degenerative changes. 3. Small joint effusion. Electronically Signed   By: Virgina Norfolk M.D.   On: 04/17/2021 23:28   DG Knee Complete 4 Views Right  Result Date: 04/17/2021 CLINICAL DATA:  Unwitnessed fall. EXAM: RIGHT KNEE - COMPLETE 4+ VIEW COMPARISON:  None. FINDINGS: No evidence of an acute fracture. Mild chronic appearing lateral subluxation of the right tibia is noted with respect to the distal right femur. Marked severity medial and lateral tibiofemoral compartment space narrowing is seen. Marked severity patellofemoral narrowing is also noted. There is moderate to marked severity vascular calcification. A very small joint effusion is suspected. IMPRESSION: 1. No evidence of acute osseous abnormality. 2. Severe tricompartmental degenerative changes. 3. Very small joint effusion. Electronically Signed   By: Virgina Norfolk M.D.   On: 04/17/2021 23:29   DG Humerus Right  Result Date: 04/17/2021 CLINICAL DATA:  Unwitnessed fall. EXAM: RIGHT HUMERUS - 2+ VIEW COMPARISON:  None. FINDINGS: There is no evidence of acute fracture. Mild superior subluxation of the right humeral head is seen with respect to the  right glenoid. Mild to moderate severity degenerative changes are also seen involving the right shoulder and right acromioclavicular joint. Soft tissues are unremarkable. IMPRESSION: 1. No acute fracture. 2. Mild superior subluxation of the right humeral head which may be chronic in nature. 3. Mild to moderate severity degenerative changes. Electronically Signed   By: Virgina Norfolk M.D.   On: 04/17/2021 23:27    EKG: I have personally reviewed EKG: shows NSR    Assessment/Plan Principal Problem:   Hypothermia due to exposure Active Problems:   Pressure ulcer, shoulder blades, stage  II ([place]) - right posterior shoulder   Pressure ulcer, hip, right, unstageable ([facility])   Type 2 diabetes mellitus with vascular disease ([place])   CKD (chronic kidney disease) stage 3, GFR 30-59 ml/min ([facility])    Pressure ulcer, shoulder blades, stage II ([place]) - right posterior shoulder Present on admission. Will need wound care to evaluate. Likely caused by her nearly 60 hour stay on the floor of her home.  Pressure ulcer, hip, right, unstageable ([place]) Present on admission. Will need wound care to evaluate. Likely caused by her nearly 60 hour stay on the floor of her home.  Hypothermia due to exposure Admit to step-down bed due to continued need for bair-hugger. Pt may need SNF placement due to inability to care for herself.  Pt is confused and disoriented. No family at bedside. Full code by default.  Type 2 diabetes mellitus with vascular disease ([facility]) Check A1c. SSI.  CKD (chronic kidney disease) stage 3, GFR 30-59 ml/min ([facility]) Stable.  DVT prophylaxis: SQ Heparin Code Status: Full Code by default. No family at bedside. Pt is confused and unable to give informed consent Family Communication: no family at bedside  Disposition Plan: will likely need SNF placement.  Consults called: none  Admission status: Inpatient, Step Down Unit   Kristopher Oppenheim, DO [facility] 04/17/2021, 11:47 PM

## 2021-04-17 NOTE — ED Provider Notes (Signed)
[facility] EMERGENCY DEPARTMENT Provider Note   CSN: [id number] Arrival date & time: 04/17/21  2120     History Chief Complaint  Patient presents with   Penny [name] is a 86 y.o. female.   Fall   This patient is a very pleasant 86 year old female who is brought in by [facility] EMS from home, she lives by herself, the patient reports that she was found by a neighbor today, unfortunately the patient had a fall from her chair when she tried to stand up to go get some food in her kitchen and fell to the ground.  She does not recall this and thinks that she passed out.  This was on Saturday morning, it is no approximately 60 hours later and the patient is brought to the emergency department after being on her back and right side on the floor.  She states that she hurts on her right arm right shoulder right ribs and bilateral knees.  She was unable to get up off of the ground at all, she dropped her phone and could not get to it.  A neighbor finally found her.  Paramedics noted the patient to be slightly hypertensive but not tachycardic, she was shivering and cold to the touch.  The nursing on arrival found the patient to be hypothermic at approximately 94.6 F.  The initial fall was acute in onset, her symptoms have been persistent gradually worsening but not associated with vomiting   No past medical history on file.  Patient Active Problem List   Diagnosis Date Noted   Pain due to onychomycosis of toenails of both feet 02/01/2021   Type 2 diabetes mellitus with vascular disease ([place]) 02/01/2021    No past surgical history on file.   OB History   No obstetric history on file.     No family history on file.  Social History   Tobacco Use   Smoking status: Never   Smokeless tobacco: Never  Substance Use Topics   Alcohol use: No   Drug use: No    Home Medications Prior to Admission medications   Medication Sig Start Date End Date Taking?  Authorizing Provider  aspirin 81 MG tablet Take 81 mg by mouth daily.    [provider]  clobetasol cream (TEMOVATE) 0.05 %  05/15/19   [provider]  diltiazem (CARDIZEM CD) 240 MG 24 hr capsule Take 240 mg by mouth daily. 08/29/15   [provider]  glipiZIDE (GLUCOTROL XL) 5 MG 24 hr tablet  08/19/15   [provider]  hydrochlorothiazide (HYDRODIURIL) 25 MG tablet Take 25 mg by mouth daily. 05/15/19   [provider]  metFORMIN (GLUCOPHAGE) 500 MG tablet  10/01/15   [provider]  olmesartan-hydrochlorothiazide (BENICAR HCT) 40-25 MG tablet  09/21/15   [provider]    Allergies    Patient has no known allergies.  Review of Systems   Review of Systems  All other systems reviewed and are negative.  Physical Exam Updated Vital Signs BP (!) 173/93   Pulse 60   Temp (!) 94.6 F (34.8 C) (Rectal)   Resp 14   LMP  (LMP Unknown)   SpO2 99%   Physical Exam Vitals and nursing note reviewed.  Constitutional:      General: She is in acute distress.     Appearance: She is well-developed. She is ill-appearing.  HENT:     Head: Normocephalic and atraumatic.  Comments: No signs of trauma to the head or the scalp, no tenderness with palpation over the face with a scalp    Nose: Nose normal.     Mouth/Throat:     Mouth: Mucous membranes are dry.     Pharynx: No oropharyngeal exudate.  Eyes:     General: No scleral icterus.       Right eye: No discharge.        Left eye: No discharge.     Conjunctiva/sclera: Conjunctivae normal.     Pupils: Pupils are equal, round, and reactive to light.  Neck:     Thyroid: No thyromegaly.     Vascular: No JVD.     Comments: Minimal tenderness to palpation over the cervical spine, immobilized in a collar Cardiovascular:     Rate and Rhythm: Normal rate and regular rhythm.     Heart sounds: Normal heart sounds. No murmur heard.   No friction rub. No gallop.  Pulmonary:      Effort: Pulmonary effort is normal. No respiratory distress.     Breath sounds: Normal breath sounds. No wheezing or rales.     Comments: There is tenderness to the right lateral rib cage Chest:     Chest wall: Tenderness present.  Abdominal:     General: Bowel sounds are normal. There is no distension.     Palpations: Abdomen is soft. There is no mass.     Tenderness: There is abdominal tenderness.     Comments: Mild diffuse tenderness without guarding  Musculoskeletal:        General: Swelling and tenderness present. No deformity. Normal range of motion.     Right lower leg: No edema.     Left lower leg: No edema.     Comments: Call atThe patient has some pressure necrosis and skin breakdown on the posterior right scapular area as her bilateral knees, there is some redness to the right thigh, the compartments are diffusely soft, her joints are diffusely stiff but no obvious deformities  Lymphadenopathy:     Cervical: No cervical adenopathy.  Skin:    General: Skin is warm and dry.     Findings: No erythema or rash.     Comments: Skin breakdown as noted below primarily to the bilateral knees right thigh and right posterior shoulder.  Neurological:     Mental Status: She is alert.     Coordination: Coordination normal.     Comments: The patient has a shiver and a tremor, she is able to grip with both hands, she needs significant assistance with 2 assistance to get her to into a sitting position and cannot hold her self there.  She is awake and alert and tries to follow commands but is very sore all over  Psychiatric:        Behavior: Behavior normal.    ED Results / Procedures / Treatments   Labs (all labs ordered are listed, but only abnormal results are displayed) Labs Reviewed  COMPREHENSIVE METABOLIC PANEL - Abnormal; Notable for the following components:      Result Value   Glucose, Bld 106 (*)    BUN 27 (*)    Creatinine, Ser 1.06 (*)    Albumin 2.7 (*)    Total Bilirubin  1.3 (*)    GFR, Estimated 52 (*)    All other components within normal limits  CBC WITH DIFFERENTIAL/PLATELET - Abnormal; Notable for the following components:   RBC 3.83 (*)    Lymphs Abs  0.4 (*)    All other components within normal limits  CK - Abnormal; Notable for the following components:   Total CK 351 (*)    All other components within normal limits  RESP PANEL BY RT-PCR (FLU A&B, COVID) ARPGX2  CULTURE, BLOOD (ROUTINE X 2)  CULTURE, BLOOD (ROUTINE X 2)  URINE CULTURE  LACTIC ACID, PLASMA  PROTIME-INR  APTT  LACTIC ACID, PLASMA  URINALYSIS, ROUTINE W REFLEX MICROSCOPIC  CBG MONITORING, ED    EKG EKG Interpretation  Date/Time:  Monday April 17 2021 23:22:01 EDT Ventricular Rate:  59 PR Interval:  137 QRS Duration: 89 QT Interval:  499 QTC Calculation: 495 R Axis:   -11 Text Interpretation: Sinus rhythm Nonspecific T abnormalities, lateral leads Borderline prolonged QT interval Confirmed by Noemi Chapel [phone]) on 04/17/2021 11:28:17 PM  Radiology CT Head Wo Contrast  Result Date: 04/17/2021 CLINICAL DATA:  Polytrauma. EXAM: CT HEAD WITHOUT CONTRAST CT CERVICAL SPINE WITHOUT CONTRAST TECHNIQUE: Multidetector CT imaging of the head and cervical spine was performed following the standard protocol without intravenous contrast. Multiplanar CT image reconstructions of the cervical spine were also generated. COMPARISON:  None. FINDINGS: CT HEAD FINDINGS Brain: No evidence of acute infarction, hemorrhage, hydrocephalus, extra-axial collection or mass lesion/mass effect. There is mild periventricular white matter hypodensity, likely chronic small vessel ischemic change. There is mild diffuse atrophy. Vascular: Atherosclerotic calcifications are present within the cavernous internal carotid arteries. Skull: Normal. Negative for fracture or focal lesion. Sinuses/Orbits: No acute finding. Other: There is right facial and right frontal soft tissue swelling. There are punctate skin  densities, likely calcifications of the face bilaterally. CT CERVICAL SPINE FINDINGS Alignment: Normal. Skull base and vertebrae: No acute fracture. No primary bone lesion or focal pathologic process. Soft tissues and spinal canal: No prevertebral fluid or swelling. No visible canal hematoma. Disc levels: There is disc space narrowing and endplate osteophyte formation at C5-C6 and C7-T1 compatible with degenerative change. There is scattered mild neural foraminal stenosis bilaterally. Disc bulge at C7-T1 causes moderate central canal stenosis. Upper chest: There is a 3 mm nodule in the left upper lobe image 5/90. Other: There is a heavily calcified left thyroid nodule measuring 9 mm. There is a 5 mm hypodense left thyroid nodule. IMPRESSION: 1.  No acute intracranial process. 2. No acute fracture or traumatic subluxation of the cervical spine. 3. 3 mm nodule in the left upper lobe. If patient is at low risk for bronchus carcinoma no follow-up is necessary. The patient is at high risk a follow-up CT is recommended in 12 months to reassess. Electronically Signed   By: Ronney Asters M.D.   On: 04/17/2021 22:52   CT Cervical Spine Wo Contrast  Result Date: 04/17/2021 CLINICAL DATA:  Polytrauma. EXAM: CT HEAD WITHOUT CONTRAST CT CERVICAL SPINE WITHOUT CONTRAST TECHNIQUE: Multidetector CT imaging of the head and cervical spine was performed following the standard protocol without intravenous contrast. Multiplanar CT image reconstructions of the cervical spine were also generated. COMPARISON:  None. FINDINGS: CT HEAD FINDINGS Brain: No evidence of acute infarction, hemorrhage, hydrocephalus, extra-axial collection or mass lesion/mass effect. There is mild periventricular white matter hypodensity, likely chronic small vessel ischemic change. There is mild diffuse atrophy. Vascular: Atherosclerotic calcifications are present within the cavernous internal carotid arteries. Skull: Normal. Negative for fracture or focal  lesion. Sinuses/Orbits: No acute finding. Other: There is right facial and right frontal soft tissue swelling. There are punctate skin densities, likely calcifications of the face bilaterally. CT CERVICAL SPINE  FINDINGS Alignment: Normal. Skull base and vertebrae: No acute fracture. No primary bone lesion or focal pathologic process. Soft tissues and spinal canal: No prevertebral fluid or swelling. No visible canal hematoma. Disc levels: There is disc space narrowing and endplate osteophyte formation at C5-C6 and C7-T1 compatible with degenerative change. There is scattered mild neural foraminal stenosis bilaterally. Disc bulge at C7-T1 causes moderate central canal stenosis. Upper chest: There is a 3 mm nodule in the left upper lobe image 5/90. Other: There is a heavily calcified left thyroid nodule measuring 9 mm. There is a 5 mm hypodense left thyroid nodule. IMPRESSION: 1.  No acute intracranial process. 2. No acute fracture or traumatic subluxation of the cervical spine. 3. 3 mm nodule in the left upper lobe. If patient is at low risk for bronchus carcinoma no follow-up is necessary. The patient is at high risk a follow-up CT is recommended in 12 months to reassess. Electronically Signed   By: Ronney Asters M.D.   On: 04/17/2021 22:52   DG Pelvis Portable  Result Date: 04/17/2021 CLINICAL DATA:  Unwitnessed fall. EXAM: PORTABLE PELVIS 1-2 VIEWS COMPARISON:  None. FINDINGS: There is no evidence of an acute pelvic fracture or diastasis. Marked severity degenerative changes are seen involving both hips, in the form of joint space narrowing and acetabular sclerosis. A large amount of stool is seen throughout the colon. IMPRESSION: 1. No acute fracture or diastasis. 2. Marked severity degenerative changes of both hips. Electronically Signed   By: Virgina Norfolk M.D.   On: 04/17/2021 23:25    Procedures Procedures   Medications Ordered in ED Medications  lactated ringers infusion (has no administration  in time range)  0.9 %  sodium chloride infusion (has no administration in time range)  cefTRIAXone (ROCEPHIN) 1 g in sodium chloride 0.9 % 100 mL IVPB (1 g Intravenous New Bag/Given 04/17/21 2212)  sodium chloride 0.9 % bolus 1,000 mL (1,000 mLs Intravenous New Bag/Given 04/17/21 2214)    ED Course  I have reviewed the triage vital signs and the nursing notes.  Pertinent labs & imaging results that were available during my care of the patient were reviewed by me and considered in my medical decision making (see chart for details).    MDM Rules/Calculators/A&P                           This patient presents after having trauma, syncope and has been on the ground for 2 days raising the risk for other comorbidities such as potentially developing rhabdomyolysis, sepsis, she is hypothermic and needs of aggressive warming, will give fluids, she has no history of congestive heart failure.  We will check her blood sugar as she has been unmedicated for 2 days.  I am concerned that the patient is significantly debilitated and will likely need admission and placement at this time I do not have any medical diagnosis other than hypothermia and generalized fatigue and weakness with skin breakdown.  I have personally viewed the CT scans as well as the plain films of the patient's pelvis bilateral knees right ribs and right humerus, I see no signs of fractures however she is riddled with severe arthritis.  I have also discussed the care with Dr. Bridgett Larsson of the hospitalist service who will admit the patient for hypothermia and generalized weakness.  Core temperature will be repeated.  Final Clinical Impression(s) / ED Diagnoses Final diagnoses:  Hypothermia, initial encounter  Weakness  Rx / DC Orders ED Discharge Orders     None        Noemi Chapel, MD 04/17/21 2329

## 2021-04-17 NOTE — ED Triage Notes (Addendum)
Pt BIB [facility] from home, where she lives alone. Pt reports a fall on Saturday morning, was unable to get up. Pt found today by a neighbor. Pt alert to self, place, time and situation. Incontinent urine. C/o right wrist, knee and low back pain.

## 2021-04-17 NOTE — ED Notes (Signed)
Patient transported to CT 

## 2021-04-17 NOTE — Subjective & Objective (Signed)
CC: found on floor HPI: 86 year old African-American female with a history of type 2 diabetes, history of vascular disease, CKD stage III who presents to the ER today after being found on the floor by her neighbor.  There is no family or neighbor at the bedside.  Patient is confused and unable to give any history review of systems.  HPI obtained from the chart.  Reportedly patient was found by her neighbor on the floor today.  Last time the patient was seen was on Saturday which was April 15, 2021.  Patient reportedly incontinent of urine.  Reportedly the patient fell from her chair when she was trying to get up to the kitchen to get some food.  When she arrived to the ER, she was hypothermic with a core temp of 94.6.  Patient placed on a [employer] banker.  Core temp is still 94 degrees.  Work-up in the ER showed no evidence of rhabdomyolysis surprisingly.  CK was 351.  The rest of her laboratory evaluation was truly unremarkable.  CT head and C-spine are negative for fracture.  No fractures of the rib, humerus, shoulder, knees, pelvic girdle were seen.  Large amount of stool noted in the colon.  Due to continued hypothermia, [facility] hospitalist contacted for admission.

## 2021-04-17 NOTE — Assessment & Plan Note (Signed)
Admit to step-down bed due to continued need for bair-hugger. Pt may need SNF placement due to inability to care for herself.  Pt is confused and disoriented. No family at bedside. Full code by default.

## 2021-04-17 NOTE — ED Notes (Signed)
Admitting provider bedside, aware of rectal temp.  Pt found to have pressure sore on right shoulder and right hip.  Bare hugger reapplyed.  EDP cleared Cspine - Ccollar removed

## 2021-04-17 NOTE — Assessment & Plan Note (Signed)
Stable

## 2021-04-17 NOTE — Sepsis Progress Note (Signed)
Following for sepsis monitoring ?

## 2021-04-18 DIAGNOSIS — L89210 Pressure ulcer of right hip, unstageable: Secondary | ICD-10-CM | POA: Diagnosis not present

## 2021-04-18 DIAGNOSIS — G9341 Metabolic encephalopathy: Secondary | ICD-10-CM | POA: Diagnosis present

## 2021-04-18 DIAGNOSIS — T68XXXA Hypothermia, initial encounter: Secondary | ICD-10-CM | POA: Diagnosis not present

## 2021-04-18 DIAGNOSIS — N1831 Chronic kidney disease, stage 3a: Secondary | ICD-10-CM

## 2021-04-18 DIAGNOSIS — E1159 Type 2 diabetes mellitus with other circulatory complications: Secondary | ICD-10-CM | POA: Diagnosis not present

## 2021-04-18 DIAGNOSIS — I739 Peripheral vascular disease, unspecified: Secondary | ICD-10-CM

## 2021-04-18 DIAGNOSIS — L89102 Pressure ulcer of unspecified part of back, stage 2: Secondary | ICD-10-CM | POA: Diagnosis not present

## 2021-04-18 DIAGNOSIS — E860 Dehydration: Secondary | ICD-10-CM

## 2021-04-18 LAB — HEMOGLOBIN A1C
Hgb A1c MFr Bld: 5.6 % (ref 4.8–5.6)
Mean Plasma Glucose: 114.02 mg/dL

## 2021-04-18 LAB — RENAL FUNCTION PANEL
Albumin: <1.5 g/dL — ABNORMAL LOW (ref 3.5–5.0)
Albumin: 2.4 g/dL — ABNORMAL LOW (ref 3.5–5.0)
Anion gap: 10 (ref 5–15)
BUN: 16 mg/dL (ref 8–23)
BUN: 29 mg/dL — ABNORMAL HIGH (ref 8–23)
CO2: 12 mmol/L — ABNORMAL LOW (ref 22–32)
CO2: 24 mmol/L (ref 22–32)
Calcium: 4 mg/dL — CL (ref 8.9–10.3)
Calcium: 8.5 mg/dL — ABNORMAL LOW (ref 8.9–10.3)
Chloride: >130 mmol/L (ref 98–111)
Chloride: 107 mmol/L (ref 98–111)
Creatinine, Ser: 0.48 mg/dL (ref 0.44–1.00)
Creatinine, Ser: 1.14 mg/dL — ABNORMAL HIGH (ref 0.44–1.00)
GFR, Estimated: >60 mL/min (ref >60)
GFR, Estimated: 47 mL/min — ABNORMAL LOW (ref >60)
Glucose, Bld: 38 mg/dL — CL (ref 70–99)
Glucose, Bld: 101 mg/dL — ABNORMAL HIGH (ref 70–99)
Phosphorus: 1.7 mg/dL — ABNORMAL LOW (ref 2.5–4.6)
Phosphorus: 3.5 mg/dL (ref 2.5–4.6)
Potassium: 2.3 mmol/L — CL (ref 3.5–5.1)
Potassium: 3.8 mmol/L (ref 3.5–5.1)
Sodium: 149 mmol/L — ABNORMAL HIGH (ref 135–145)
Sodium: 141 mmol/L (ref 135–145)

## 2021-04-18 LAB — CK
Total CK: 232 U/L (ref 38–234)
Total CK: 125 U/L (ref 38–234)

## 2021-04-18 LAB — BLOOD CULTURE ID PANEL (REFLEXED) - BCID2

## 2021-04-18 LAB — URINALYSIS, ROUTINE W REFLEX MICROSCOPIC
Bacteria, UA: NONE SEEN
Bilirubin Urine: NEGATIVE
Glucose, UA: NEGATIVE mg/dL
Hgb urine dipstick: NEGATIVE
Ketones, ur: 5 mg/dL — AB
Leukocytes,Ua: NEGATIVE
Nitrite: NEGATIVE
Protein, ur: 100 mg/dL — AB
Specific Gravity, Urine: 1.024 (ref 1.005–1.030)
pH: 5 (ref 5.0–8.0)

## 2021-04-18 LAB — RAPID URINE DRUG SCREEN, HOSP PERFORMED
Amphetamines: NOT DETECTED
Barbiturates: NOT DETECTED
Benzodiazepines: NOT DETECTED
Cocaine: NOT DETECTED
Opiates: NOT DETECTED
Tetrahydrocannabinol: NOT DETECTED

## 2021-04-18 LAB — CBG MONITORING, ED
Glucose-Capillary: 101 mg/dL — ABNORMAL HIGH (ref 70–99)
Glucose-Capillary: 138 mg/dL — ABNORMAL HIGH (ref 70–99)
Glucose-Capillary: 62 mg/dL — ABNORMAL LOW (ref 70–99)
Glucose-Capillary: 81 mg/dL (ref 70–99)
Glucose-Capillary: 85 mg/dL (ref 70–99)
Glucose-Capillary: 96 mg/dL (ref 70–99)

## 2021-04-18 LAB — MAGNESIUM
Magnesium: 0.8 mg/dL — CL (ref 1.7–2.4)
Magnesium: 1.8 mg/dL (ref 1.7–2.4)

## 2021-04-18 LAB — CBC WITH DIFFERENTIAL/PLATELET
Abs Immature Granulocytes: 0.02 10*3/uL (ref 0.00–0.07)
Basophils Absolute: 0 10*3/uL (ref 0.0–0.1)
Basophils Relative: 0 %
Eosinophils Absolute: 0 10*3/uL (ref 0.0–0.5)
Eosinophils Relative: 1 %
HCT: 34.2 % — ABNORMAL LOW (ref 36.0–46.0)
Hemoglobin: 10.8 g/dL — ABNORMAL LOW (ref 12.0–15.0)
Immature Granulocytes: 0 %
Lymphocytes Relative: 6 %
Lymphs Abs: 0.4 10*3/uL — ABNORMAL LOW (ref 0.7–4.0)
MCH: 31.4 pg (ref 26.0–34.0)
MCHC: 31.6 g/dL (ref 30.0–36.0)
MCV: 99.4 fL (ref 80.0–100.0)
Monocytes Absolute: 0.5 10*3/uL (ref 0.1–1.0)
Monocytes Relative: 8 %
Neutro Abs: 5.6 10*3/uL (ref 1.7–7.7)
Neutrophils Relative %: 85 %
Platelets: 177 10*3/uL (ref 150–400)
RBC: 3.44 MIL/uL — ABNORMAL LOW (ref 3.87–5.11)
RDW: 13.5 % (ref 11.5–15.5)
WBC: 6.6 10*3/uL (ref 4.0–10.5)
nRBC: 0 % (ref 0.0–0.2)

## 2021-04-18 LAB — VITAMIN B12: Vitamin B-12: 1358 pg/mL — ABNORMAL HIGH (ref 180–914)

## 2021-04-18 LAB — AMMONIA: Ammonia: 22 umol/L (ref 9–35)

## 2021-04-18 LAB — TSH: TSH: 1.887 u[IU]/mL (ref 0.350–4.500)

## 2021-04-18 MED ORDER — INSULIN ASPART 100 UNIT/ML IJ SOLN: 0.0000 [IU] | Freq: Every day | INTRAMUSCULAR | Status: DC

## 2021-04-18 MED ORDER — ASPIRIN 81 MG PO CHEW
81.0000 mg | CHEWABLE_TABLET | Freq: Every day | ORAL | Status: DC
  Administered 2021-04-18 – 2021-04-21 (×4): 81 mg via ORAL
  Filled 2021-04-18 (×4): qty 1

## 2021-04-18 MED ORDER — LACTATED RINGERS IV SOLN: INTRAVENOUS | Status: DC

## 2021-04-18 MED ORDER — ONDANSETRON HCL 4 MG PO TABS: 4.0000 mg | ORAL_TABLET | Freq: Four times a day (QID) | ORAL | Status: DC | PRN

## 2021-04-18 MED ORDER — HYDRALAZINE HCL 25 MG PO TABS: 25.0000 mg | ORAL_TABLET | Freq: Four times a day (QID) | ORAL | Status: DC | PRN

## 2021-04-18 MED ORDER — ADULT MULTIVITAMIN W/MINERALS CH
1.0000 | ORAL_TABLET | Freq: Every day | ORAL | Status: DC
  Administered 2021-04-18 – 2021-04-21 (×4): 1 via ORAL
  Filled 2021-04-18 (×4): qty 1

## 2021-04-18 MED ORDER — ACETAMINOPHEN 325 MG PO TABS: 650.0000 mg | ORAL_TABLET | Freq: Four times a day (QID) | ORAL | Status: DC | PRN

## 2021-04-18 MED ORDER — ACETAMINOPHEN 650 MG RE SUPP: 650.0000 mg | Freq: Four times a day (QID) | RECTAL | Status: DC | PRN

## 2021-04-18 MED ORDER — INSULIN ASPART 100 UNIT/ML IJ SOLN: 0.0000 [IU] | Freq: Three times a day (TID) | INTRAMUSCULAR | Status: DC

## 2021-04-18 MED ORDER — DILTIAZEM HCL ER COATED BEADS 240 MG PO CP24
240.0000 mg | ORAL_CAPSULE | Freq: Every day | ORAL | Status: DC
  Administered 2021-04-18 – 2021-04-21 (×4): 240 mg via ORAL
  Filled 2021-04-18 (×2): qty 1
  Filled 2021-04-18: qty 2
  Filled 2021-04-18 (×2): qty 1
  Filled 2021-04-18: qty 2

## 2021-04-18 MED ORDER — HYDRALAZINE HCL 25 MG PO TABS
25.0000 mg | ORAL_TABLET | Freq: Three times a day (TID) | ORAL | Status: DC
  Administered 2021-04-18 – 2021-04-21 (×10): 25 mg via ORAL
  Filled 2021-04-18 (×10): qty 1

## 2021-04-18 MED ORDER — ONDANSETRON HCL 4 MG/2ML IJ SOLN: 4.0000 mg | Freq: Four times a day (QID) | INTRAMUSCULAR | Status: DC | PRN

## 2021-04-18 MED ORDER — CEFTRIAXONE SODIUM 1 G IJ SOLR
1.0000 g | INTRAMUSCULAR | Status: AC
  Administered 2021-04-18 – 2021-04-21 (×4): 1 g via INTRAVENOUS
  Filled 2021-04-18 (×4): qty 10

## 2021-04-18 MED ORDER — SENNA 8.6 MG PO TABS
1.0000 | ORAL_TABLET | Freq: Two times a day (BID) | ORAL | Status: DC
  Administered 2021-04-18 – 2021-04-21 (×7): 8.6 mg via ORAL
  Filled 2021-04-18 (×8): qty 1

## 2021-04-18 MED ORDER — HEPARIN SODIUM (PORCINE) 5000 UNIT/ML IJ SOLN
5000.0000 [IU] | Freq: Three times a day (TID) | INTRAMUSCULAR | Status: DC
  Administered 2021-04-18 – 2021-04-21 (×6): 5000 [IU] via SUBCUTANEOUS
  Filled 2021-04-18 (×8): qty 1

## 2021-04-18 NOTE — Progress Notes (Addendum)
PROGRESS NOTE  FIZZA SCALES RSW:[id number] DOB: 1935-02-18   PCP: Kelton Pillar, MD  [facility]  DOA: 04/17/2021 LOS: 1  Chief complaints:  Chief Complaint  Patient presents with   Fall     Brief Narrative / Interim history: 86 year old F with PMH of DM-2, PAD, CKD-3 and possible cognitive impairment brought to ED after found down at home for unknown period of time and found to be hypothermic with core temp to 94.6 F and admitted for hypothermia and acute metabolic encephalopathy.  Last time seen well on 04/15/2021.  Has mildly elevated CK to 351.  CTh, CT cervical spine and multiple x-rays without significant finding. Started on IV fluid.   Subjective: Seen and examined earlier this morning.  No major events overnight of this morning.  No complaints but not a reliable historian.  She is only oriented to self.  She follows commands.  She points at the corner of the room when asked where she lives.  She responds no to pain.   Objective: Vitals:   04/18/21 1001 04/18/21 1013 04/18/21 1240 04/18/21 1300  BP: (!) 172/96  (!) 173/84 (!) 168/85  Pulse:   69 75  Resp:   15 17  Temp:  98.8 F (37.1 C)    TempSrc:  Oral    SpO2:   97% 95%    Intake/Output Summary (Last 24 hours) at 04/18/2021 1326 Last data filed at 04/18/2021 1323 Gross per 24 hour  Intake 1188.59 ml  Output 250 ml  Net 938.59 ml   There were no vitals filed for this visit.  Examination:  GENERAL: No apparent distress.  Nontoxic. HEENT: MMM.  Vision and hearing grossly intact.  NECK: Supple.  No apparent JVD.  RESP: 95% on RA.  No IWOB.  Fair aeration bilaterally. CVS:  RRR. Heart sounds normal.  ABD/GI/GU: BS+. Abd soft, NTND.  MSK/EXT:  Moves extremities.  Significant muscle mass and subcu fat loss.  Faint DP pulses in BLE. SKIN: Pressure skin injury as below. NEURO: Awake.  Follows some commands.  Oriented only to self.  No apparent focal neuro deficit. PSYCH: Calm. Normal  affect.   Procedures:  None  Microbiology summarized: [id number] and influenza PCR nonreactive.  Assessment & Plan: Hypothermia-resolved.  Seems to have mild temp to 100.1 now.  No clear source of infection but we have no urinalysis or urine culture.  She has no leukocytosis, suprapubic tenderness or CVA tenderness. -Follow urinalysis  Acute metabolic encephalopathy-seems to have underlying cognitive impairment.  She is awake but only oriented to self.  No apparent focal neurodeficit.  CT head without acute finding.  Does not seem to have respiratory symptoms either.  Difficult to tell if she has UTI.  Blood cultures NGTD.  She has pressure skin injury over her right shoulder and right hip but does not look infected. -Check basic encephalopathy labs-including ammonia, B12, RPR, TSH, UA and urine culture -Continue ceftriaxone for now -Reorientation and delirium precautions.  Addendum Improved mentation this afternoon.  Awake.  Oriented to self and place but not time.  No clear recollection of what happened.  Otherwise, he had thought process seems to be clear.  Traumatic rhabdomyolysis: Mild.  Resolved.  Dehydration: Still looks dehydrated on exam. -Continue IV fluid as above  Controlled DM-2-metformin and glipizide on her med list from 2017 but med rec pending.  A1c 5.6%. Recent Labs  Lab 04/17/21 2321 04/18/21 0756 04/18/21 0843 04/18/21 1049  GLUCAP 87 62* 81  101*  -Changed to very sensitive scale and discontinue nightly coverage.  CKD-3A: unknown baseline. Recent Labs    04/17/21 2208 04/18/21 0817 04/18/21 1011  BUN 27* 16 29*  CREATININE 1.06* 0.48 1.14*  -Continue monitoring  Uncontrolled hypertension: Could be driven by IV fluid. -Discontinue NS -Reduce LR to 75 cc an hour -Add p.o. hydralazine as needed with parameters.  Cognitive impairment-seems to have underlying cognitive impairment but no history in the chart or Care Everywhere.  Per patient's cousin, no  history of dementia.  She last talked to her about a week ago and did not note any confusion.  -Reorientation and delirium precautions  Fall at home/physical deconditioning-trauma survey negative. -Fall precaution -PT/OT eval  PAD?-Seems to have skin changes consistent with PVD.  Faint DP pulses bilaterally. -Check [name]    Pressure ulcers-unstageable, may be stage II on right hip.  Looks new.  Likely from being down Pressure skin injury on right shoulde- healed No signs of infection. -See picture under media for more -Consult WOCN.  There is no height or weight on file to calculate BMI.         DVT prophylaxis:  heparin injection 5,000 Units Start: 04/18/21 0600 SCDs Start: 04/18/21 0451  Code Status: Full code by default Family Communication: Patient and/or RN.  No family member at bedside.  Talked to her cousin Level of care: Progressive Status is: Inpatient  Remains inpatient appropriate because:Altered mental status, IV treatments appropriate due to intensity of illness or inability to take PO, and Inpatient level of care appropriate due to severity of illness  Dispo: The patient is from: Home              Anticipated d/c is to:  To be determined              Patient currently is not medically stable to d/c.   Difficult to place patient No       Consultants:  None   Sch Meds:  Scheduled Meds:  aspirin  81 mg Oral Daily   diltiazem  240 mg Oral Daily   heparin  5,000 Units Subcutaneous Q8H   insulin aspart  0-5 Units Subcutaneous QHS   insulin aspart  0-9 Units Subcutaneous TID WC   senna  1 tablet Oral BID   Continuous Infusions:  lactated ringers 75 mL/hr at 04/18/21 0818   PRN Meds:.acetaminophen **OR** acetaminophen, hydrALAZINE, ondansetron **OR** ondansetron (ZOFRAN) IV  Antimicrobials: Anti-infectives (From admission, onward)    Start     Dose/Rate Route Frequency Ordered Stop   04/17/21 2145  cefTRIAXone (ROCEPHIN) 1 g in sodium chloride 0.9 %  100 mL IVPB        1 g 200 mL/hr over 30 Minutes Intravenous  Once 04/17/21 2140 04/17/21 2338        I have personally reviewed the following labs and images: CBC: Recent Labs  Lab 04/17/21 2208 04/18/21 0708  WBC 6.7 6.6  NEUTROABS 5.7 5.6  HGB 12.0 10.8*  HCT 37.8 34.2*  MCV 98.7 99.4  PLT 186 177   BMP &GFR Recent Labs  Lab 04/17/21 2208 04/18/21 0817 04/18/21 1011  NA 139 149* 141  K 3.5 2.3* 3.8  CL 103 >130* 107  CO2 26 12* 24  GLUCOSE 106* 38* 101*  BUN 27* 16 29*  CREATININE 1.06* 0.48 1.14*  CALCIUM 9.1 4.0* 8.5*  MG  --  0.8*  --   PHOS  --  1.7* 3.5   CrCl cannot be  calculated (Unknown ideal weight.). Liver & Pancreas: Recent Labs  Lab 04/17/21 2208 04/18/21 0817 04/18/21 1011  AST 40  --   --   ALT 22  --   --   ALKPHOS 60  --   --   BILITOT 1.3*  --   --   PROT 7.7  --   --   ALBUMIN 2.7* <1.5* 2.4*   No results for input(s): LIPASE, AMYLASE in the last 168 hours. No results for input(s): AMMONIA in the last 168 hours. Diabetic: Recent Labs    04/18/21 0707  HGBA1C 5.6   Recent Labs  Lab 04/17/21 2321 04/18/21 0756 04/18/21 0843 04/18/21 1049  GLUCAP 87 62* 81 101*   Cardiac Enzymes: Recent Labs  Lab 04/17/21 2208 04/18/21 0708 04/18/21 0817  CKTOTAL 351* 232 125   No results for input(s): PROBNP in the last 8760 hours. Coagulation Profile: Recent Labs  Lab 04/17/21 2208  INR 1.0   Thyroid Function Tests: No results for input(s): TSH, T4TOTAL, FREET4, T3FREE, THYROIDAB in the last 72 hours. Lipid Profile: No results for input(s): CHOL, HDL, LDLCALC, TRIG, CHOLHDL, LDLDIRECT in the last 72 hours. Anemia Panel: No results for input(s): VITAMINB12, FOLATE, FERRITIN, TIBC, IRON, RETICCTPCT in the last 72 hours. Urine analysis: No results found for: COLORURINE, APPEARANCEUR, LABSPEC, PHURINE, GLUCOSEU, HGBUR, BILIRUBINUR, KETONESUR, PROTEINUR, UROBILINOGEN, NITRITE, LEUKOCYTESUR Sepsis Labs: Invalid input(s):  PROCALCITONIN, [place]  Microbiology: Recent Results (from the past 240 hour(s))  Blood Culture (routine x 2)     Status: None (Preliminary result)   Collection Time: 04/17/21 10:00 PM   Specimen: BLOOD RIGHT WRIST  Result Value Ref Range Status   Specimen Description BLOOD RIGHT WRIST  Final   Special Requests   Final    BOTTLES DRAWN AEROBIC AND ANAEROBIC Blood Culture adequate volume   Culture   Final    NO GROWTH < 12 HOURS Performed at [facility], [address], [place], [place] [address]    Report Status PENDING  Incomplete  Blood Culture (routine x 2)     Status: None (Preliminary result)   Collection Time: 04/17/21 10:08 PM   Specimen: BLOOD  Result Value Ref Range Status   Specimen Description BLOOD LEFT ANTECUBITAL  Final   Special Requests   Final    BOTTLES DRAWN AEROBIC AND ANAEROBIC Blood Culture results may not be optimal due to an inadequate volume of blood received in culture bottles   Culture   Final    NO GROWTH < 12 HOURS Performed at [facility], [place] [address], [place], [place] [address]    Report Status PENDING  Incomplete  Resp Panel by RT-PCR (Flu A&B, Covid) Nasopharyngeal Swab     Status: None   Collection Time: 04/17/21 10:10 PM   Specimen: Nasopharyngeal Swab; Nasopharyngeal(NP) swabs in vial transport medium  Result Value Ref Range Status   SARS Coronavirus 2 by RT PCR NEGATIVE NEGATIVE Final    Comment: (NOTE) SARS-CoV-2 target nucleic acids are NOT DETECTED.  The SARS-CoV-2 RNA is generally detectable in upper respiratory specimens during the acute phase of infection. The lowest concentration of SARS-CoV-2 viral copies this assay can detect is 138 copies/mL. A negative result does not preclude SARS-Cov-2 infection and should not be used as the sole basis for treatment or other patient management decisions. A negative result may occur with  improper specimen collection/handling, submission of specimen other than  nasopharyngeal swab, presence of viral mutation(s) within the areas targeted by this  assay, and inadequate number of viral copies(<138 copies/mL). A negative result must be combined with clinical observations, patient history, and epidemiological information. The expected result is Negative.  Fact Sheet for Patients:  EntrepreneurPulse.com.au  Fact Sheet for Healthcare Providers:  IncredibleEmployment.be  This test is no t yet approved or cleared by the [place] FDA and  has been authorized for detection and/or diagnosis of SARS-CoV-2 by FDA under an Emergency Use Authorization (EUA). This EUA will remain  in effect (meaning this test can be used) for the duration of the COVID-19 declaration under Section 564(b)(1) of the Act, 21 U.S.C.section 360bbb-3(b)(1), unless the authorization is terminated  or revoked sooner.       Influenza A by PCR NEGATIVE NEGATIVE Final   Influenza B by PCR NEGATIVE NEGATIVE Final    Comment: (NOTE) The Xpert Xpress SARS-CoV-2/FLU/RSV plus assay is intended as an aid in the diagnosis of influenza from Nasopharyngeal swab specimens and should not be used as a sole basis for treatment. Nasal washings and aspirates are unacceptable for Xpert Xpress SARS-CoV-2/FLU/RSV testing.  Fact Sheet for Patients: EntrepreneurPulse.com.au  Fact Sheet for Healthcare Providers: IncredibleEmployment.be  This test is not yet approved or cleared by the [place] FDA and has been authorized for detection and/or diagnosis of SARS-CoV-2 by FDA under an Emergency Use Authorization (EUA). This EUA will remain in effect (meaning this test can be used) for the duration of the COVID-19 declaration under Section 564(b)(1) of the Act, 21 U.S.C. section 360bbb-3(b)(1), unless the authorization is terminated or revoked.  Performed at [facility], [place] [address], [place],  [address]      Radiology Studies: DG Ribs Unilateral W/Chest Right  Result Date: 04/17/2021 CLINICAL DATA:  Unwitnessed fall. EXAM: RIGHT RIBS AND CHEST - 3+ VIEW COMPARISON:  None. FINDINGS: No fracture or other bone lesions are seen involving the ribs. Degenerative changes are seen throughout the thoracic spine. There is no evidence of pneumothorax or pleural effusion. Both lungs are clear. Heart size and mediastinal contours are within normal limits. IMPRESSION: No acute osseous abnormality. Electronically Signed   By: Virgina Norfolk M.D.   On: 04/17/2021 23:30   DG Shoulder Right  Result Date: 04/17/2021 CLINICAL DATA:  Unwitnessed fall. EXAM: RIGHT SHOULDER - 2+ VIEW COMPARISON:  None. FINDINGS: There is no evidence of an acute fracture or dislocation. Chronic deformities are seen involving the inferolateral aspect of the right humeral head. A chronic deformity of the greater tubercle is also seen seen. Moderate to marked severity degenerative changes are seen involving the right acromioclavicular joint and right glenohumeral articulation. Soft tissues are unremarkable. IMPRESSION: 1. Degenerative changes without evidence of an acute fracture or dislocation. 2. Chronic deformities of the right humeral head and right greater tubercle. Electronically Signed   By: Virgina Norfolk M.D.   On: 04/17/2021 23:31   CT Head Wo Contrast  Result Date: 04/17/2021 CLINICAL DATA:  Polytrauma. EXAM: CT HEAD WITHOUT CONTRAST CT CERVICAL SPINE WITHOUT CONTRAST TECHNIQUE: Multidetector CT imaging of the head and cervical spine was performed following the standard protocol without intravenous contrast. Multiplanar CT image reconstructions of the cervical spine were also generated. COMPARISON:  None. FINDINGS: CT HEAD FINDINGS Brain: No evidence of acute infarction, hemorrhage, hydrocephalus, extra-axial collection or mass lesion/mass effect. There is mild periventricular white matter hypodensity, likely chronic small vessel  ischemic change. There is mild diffuse atrophy. Vascular: Atherosclerotic calcifications are present within the cavernous internal carotid arteries. Skull: Normal. Negative for fracture or focal  lesion. Sinuses/Orbits: No acute finding. Other: There is right facial and right frontal soft tissue swelling. There are punctate skin densities, likely calcifications of the face bilaterally. CT CERVICAL SPINE FINDINGS Alignment: Normal. Skull base and vertebrae: No acute fracture. No primary bone lesion or focal pathologic process. Soft tissues and spinal canal: No prevertebral fluid or swelling. No visible canal hematoma. Disc levels: There is disc space narrowing and endplate osteophyte formation at C5-C6 and C7-T1 compatible with degenerative change. There is scattered mild neural foraminal stenosis bilaterally. Disc bulge at C7-T1 causes moderate central canal stenosis. Upper chest: There is a 3 mm nodule in the left upper lobe image 5/90. Other: There is a heavily calcified left thyroid nodule measuring 9 mm. There is a 5 mm hypodense left thyroid nodule. IMPRESSION: 1.  No acute intracranial process. 2. No acute fracture or traumatic subluxation of the cervical spine. 3. 3 mm nodule in the left upper lobe. If patient is at low risk for bronchus carcinoma no follow-up is necessary. The patient is at high risk a follow-up CT is recommended in 12 months to reassess. Electronically Signed   By: Ronney Asters M.D.   On: 04/17/2021 22:52   CT Cervical Spine Wo Contrast  Result Date: 04/17/2021 CLINICAL DATA:  Polytrauma. EXAM: CT HEAD WITHOUT CONTRAST CT CERVICAL SPINE WITHOUT CONTRAST TECHNIQUE: Multidetector CT imaging of the head and cervical spine was performed following the standard protocol without intravenous contrast. Multiplanar CT image reconstructions of the cervical spine were also generated. COMPARISON:  None. FINDINGS: CT HEAD FINDINGS Brain: No evidence of acute infarction, hemorrhage, hydrocephalus,  extra-axial collection or mass lesion/mass effect. There is mild periventricular white matter hypodensity, likely chronic small vessel ischemic change. There is mild diffuse atrophy. Vascular: Atherosclerotic calcifications are present within the cavernous internal carotid arteries. Skull: Normal. Negative for fracture or focal lesion. Sinuses/Orbits: No acute finding. Other: There is right facial and right frontal soft tissue swelling. There are punctate skin densities, likely calcifications of the face bilaterally. CT CERVICAL SPINE FINDINGS Alignment: Normal. Skull base and vertebrae: No acute fracture. No primary bone lesion or focal pathologic process. Soft tissues and spinal canal: No prevertebral fluid or swelling. No visible canal hematoma. Disc levels: There is disc space narrowing and endplate osteophyte formation at C5-C6 and C7-T1 compatible with degenerative change. There is scattered mild neural foraminal stenosis bilaterally. Disc bulge at C7-T1 causes moderate central canal stenosis. Upper chest: There is a 3 mm nodule in the left upper lobe image 5/90. Other: There is a heavily calcified left thyroid nodule measuring 9 mm. There is a 5 mm hypodense left thyroid nodule. IMPRESSION: 1.  No acute intracranial process. 2. No acute fracture or traumatic subluxation of the cervical spine. 3. 3 mm nodule in the left upper lobe. If patient is at low risk for bronchus carcinoma no follow-up is necessary. The patient is at high risk a follow-up CT is recommended in 12 months to reassess. Electronically Signed   By: Ronney Asters M.D.   On: 04/17/2021 22:52   DG Pelvis Portable  Result Date: 04/17/2021 CLINICAL DATA:  Unwitnessed fall. EXAM: PORTABLE PELVIS 1-2 VIEWS COMPARISON:  None. FINDINGS: There is no evidence of an acute pelvic fracture or diastasis. Marked severity degenerative changes are seen involving both hips, in the form of joint space narrowing and acetabular sclerosis. A large amount of  stool is seen throughout the colon. IMPRESSION: 1. No acute fracture or diastasis. 2. Marked severity degenerative changes of both hips.  Electronically Signed   By: Virgina Norfolk M.D.   On: 04/17/2021 23:25   DG Knee Complete 4 Views Left  Result Date: 04/17/2021 CLINICAL DATA:  Unwitnessed fall. EXAM: LEFT KNEE - COMPLETE 4+ VIEW COMPARISON:  None. FINDINGS: No evidence of an acute fracture or dislocation. Marked severity narrowing of the medial and lateral tibiofemoral compartment spaces is seen. Marked severity patellofemoral narrowing is also noted. Marked severity vascular calcification is seen. There is a small joint effusion. IMPRESSION: 1. No evidence of acute fracture or dislocation. 2. Severe tricompartmental degenerative changes. 3. Small joint effusion. Electronically Signed   By: Virgina Norfolk M.D.   On: 04/17/2021 23:28   DG Knee Complete 4 Views Right  Result Date: 04/17/2021 CLINICAL DATA:  Unwitnessed fall. EXAM: RIGHT KNEE - COMPLETE 4+ VIEW COMPARISON:  None. FINDINGS: No evidence of an acute fracture. Mild chronic appearing lateral subluxation of the right tibia is noted with respect to the distal right femur. Marked severity medial and lateral tibiofemoral compartment space narrowing is seen. Marked severity patellofemoral narrowing is also noted. There is moderate to marked severity vascular calcification. A very small joint effusion is suspected. IMPRESSION: 1. No evidence of acute osseous abnormality. 2. Severe tricompartmental degenerative changes. 3. Very small joint effusion. Electronically Signed   By: Virgina Norfolk M.D.   On: 04/17/2021 23:29   DG Humerus Right  Result Date: 04/17/2021 CLINICAL DATA:  Unwitnessed fall. EXAM: RIGHT HUMERUS - 2+ VIEW COMPARISON:  None. FINDINGS: There is no evidence of acute fracture. Mild superior subluxation of the right humeral head is seen with respect to the right glenoid. Mild to moderate severity degenerative changes are also  seen involving the right shoulder and right acromioclavicular joint. Soft tissues are unremarkable. IMPRESSION: 1. No acute fracture. 2. Mild superior subluxation of the right humeral head which may be chronic in nature. 3. Mild to moderate severity degenerative changes. Electronically Signed   By: Virgina Norfolk M.D.   On: 04/17/2021 23:27      Reyne Falconi T. [place]  If 7PM-7AM, please contact night-coverage [email or web address] 04/18/2021, 1:26 PM

## 2021-04-18 NOTE — ED Notes (Signed)
CBG 62. 4 oz of apple juice given, patient eating breakfast. Will recheck.

## 2021-04-18 NOTE — ED Notes (Signed)
Notified Dr. Cyndia Skeeters of critical results on renal function panel which are not on par with samples taken yesterday at 2200. Unsure if sample hemolyzed. Will redraw.

## 2021-04-18 NOTE — ED Notes (Signed)
Family at bedside. Pt given meal.

## 2021-04-18 NOTE — ED Notes (Signed)
Pt niece at bedside would like to speak to someone about POA status - Mariann Laster to come speak to patient

## 2021-04-18 NOTE — ED Notes (Signed)
Patient placed in mittens due to pulling out IV and constantly fidgeting with other Ivs and wrist bracelets.

## 2021-04-18 NOTE — ED Notes (Signed)
Pt received breakfast tray. Pt up in bed eating.

## 2021-04-18 NOTE — ED Notes (Signed)
Main lab called for Uds add on.

## 2021-04-18 NOTE — Progress Notes (Signed)
CSW contacted patients niece and discussed the POA process. Case management walked into the room to also speak with the niece. Patients niece stated she would contact CSW back if she had any additional questions.

## 2021-04-18 NOTE — ED Notes (Signed)
This RN spoke to [place] with social work who is working remotely but said that she will give pt niece a call - pt niece updated

## 2021-04-18 NOTE — ED Notes (Signed)
Bare hugger removed.  Pt is alert but confused.

## 2021-04-18 NOTE — Progress Notes (Signed)
PHARMACY - PHYSICIAN COMMUNICATION CRITICAL VALUE ALERT - BLOOD CULTURE IDENTIFICATION (BCID)  Penny [name] is an 86 y.o. female who presented to [facility] on 04/17/2021 with a chief complaint of found down at home with altered mental status.  Assessment:  WBC wnl, hypothermic Name of physician (or Provider) Contacted: none  Current antibiotics: Ceftriaxone  Changes to prescribed antibiotics recommended:  Patient is on recommended antibiotics - No changes needed   Bonnita Nasuti Pharm.D. CPP, BCPS Clinical Pharmacist [phone] 04/18/2021 9:09 PM    Results for orders placed or performed during the hospital encounter of 04/17/21  Blood Culture ID Panel (Reflexed) (Collected: 04/17/2021 10:08 PM)  Result Value Ref Range   Enterococcus faecalis NOT DETECTED NOT DETECTED   Enterococcus Faecium NOT DETECTED NOT DETECTED   Listeria monocytogenes NOT DETECTED NOT DETECTED   Staphylococcus species DETECTED (A) NOT DETECTED   Staphylococcus aureus (BCID) NOT DETECTED NOT DETECTED   Staphylococcus epidermidis NOT DETECTED NOT DETECTED   Staphylococcus lugdunensis NOT DETECTED NOT DETECTED   Streptococcus species NOT DETECTED NOT DETECTED   Streptococcus agalactiae NOT DETECTED NOT DETECTED   Streptococcus pneumoniae NOT DETECTED NOT DETECTED   Streptococcus pyogenes NOT DETECTED NOT DETECTED   A.calcoaceticus-baumannii NOT DETECTED NOT DETECTED   Bacteroides fragilis NOT DETECTED NOT DETECTED   Enterobacterales NOT DETECTED NOT DETECTED   Enterobacter cloacae complex NOT DETECTED NOT DETECTED   Escherichia coli NOT DETECTED NOT DETECTED   Klebsiella aerogenes NOT DETECTED NOT DETECTED   Klebsiella oxytoca NOT DETECTED NOT DETECTED   Klebsiella pneumoniae NOT DETECTED NOT DETECTED   Proteus species NOT DETECTED NOT DETECTED   Salmonella species NOT DETECTED NOT DETECTED   Serratia marcescens NOT DETECTED NOT DETECTED   Haemophilus influenzae NOT DETECTED NOT DETECTED   Neisseria  meningitidis NOT DETECTED NOT DETECTED   Pseudomonas aeruginosa NOT DETECTED NOT DETECTED   Stenotrophomonas maltophilia NOT DETECTED NOT DETECTED   Candida albicans NOT DETECTED NOT DETECTED   Candida auris NOT DETECTED NOT DETECTED   Candida glabrata NOT DETECTED NOT DETECTED   Candida krusei NOT DETECTED NOT DETECTED   Candida parapsilosis NOT DETECTED NOT DETECTED   Candida tropicalis NOT DETECTED NOT DETECTED   Cryptococcus neoformans/gattii NOT DETECTED NOT DETECTED    [name] 04/18/2021  9:07 PM

## 2021-04-18 NOTE — Consult Note (Signed)
[place] Nurse Consult Note: Patient receiving care in [facility] ED015 Patient found down and was on Right side Reason for Consult: Right shoulder and right hip wounds Wound type: DTPI to right posterior should and right hip Pressure Injury POA: Yes Measurement: Right hip 9 x 8 and is purple/maroon. Right shoulder is 5 x 1.5 pink and tan Drainage (amount, consistency, odor) None  Dressing procedure/placement/frequency: Place a foam dressing over both wounds and keep patient off of the right hip as much as possible. Turn q 2.  Monitor the wound area(s) for worsening of condition such as: Signs/symptoms of infection, increase in size, development of or worsening of odor, development of pain, or increased pain at the affected locations.   Notify the medical team if any of these develop.  Pressure Injury Prevention Bundle May use any that apply to this patient. Support surfaces (air mattress) chair cushion Kellie Simmering # [phone]) Heel offloading boots Kellie Simmering # [phone]) Turning and Positioning  Measures to reduce shear (draw sheet, knees up) Skin protection Products (Foam dressing) Moisture management products (Critic-Aid Barrier Cream (Purple top) Sween moisturizing lotion (Pink top in clean supply) Nutrition Management Protection for Medical Devices Routine Skin Assessment  Thank you for the consult. [place] nurse will not follow at this time.   Please re-consult the [place] team if needed.  Cathlean Marseilles Tamala Julian, MSN, RN, [place], Lysle Pearl, [facility] Wound Treatment Associate Pager [phone]

## 2021-04-19 ENCOUNTER — Inpatient Hospital Stay (HOSPITAL_COMMUNITY): Payer: Medicare Other

## 2021-04-19 DIAGNOSIS — T68XXXA Hypothermia, initial encounter: Secondary | ICD-10-CM | POA: Diagnosis not present

## 2021-04-19 DIAGNOSIS — L89210 Pressure ulcer of right hip, unstageable: Secondary | ICD-10-CM | POA: Diagnosis not present

## 2021-04-19 DIAGNOSIS — G9341 Metabolic encephalopathy: Secondary | ICD-10-CM | POA: Diagnosis not present

## 2021-04-19 DIAGNOSIS — N1831 Chronic kidney disease, stage 3a: Secondary | ICD-10-CM | POA: Diagnosis not present

## 2021-04-19 LAB — BASIC METABOLIC PANEL
Anion gap: 7 (ref 5–15)
BUN: 25 mg/dL — ABNORMAL HIGH (ref 8–23)
CO2: 28 mmol/L (ref 22–32)
Calcium: 8.5 mg/dL — ABNORMAL LOW (ref 8.9–10.3)
Chloride: 103 mmol/L (ref 98–111)
Creatinine, Ser: 1.02 mg/dL — ABNORMAL HIGH (ref 0.44–1.00)
GFR, Estimated: 54 mL/min — ABNORMAL LOW (ref >60)
Glucose, Bld: 138 mg/dL — ABNORMAL HIGH (ref 70–99)
Potassium: 3.9 mmol/L (ref 3.5–5.1)
Sodium: 138 mmol/L (ref 135–145)

## 2021-04-19 LAB — CBC WITH DIFFERENTIAL/PLATELET
Abs Immature Granulocytes: 0.02 10*3/uL (ref 0.00–0.07)
Basophils Absolute: 0 10*3/uL (ref 0.0–0.1)
Basophils Relative: 0 %
Eosinophils Absolute: 0.3 10*3/uL (ref 0.0–0.5)
Eosinophils Relative: 7 %
HCT: 32.3 % — ABNORMAL LOW (ref 36.0–46.0)
Hemoglobin: 10.3 g/dL — ABNORMAL LOW (ref 12.0–15.0)
Immature Granulocytes: 0 %
Lymphocytes Relative: 13 %
Lymphs Abs: 0.7 10*3/uL (ref 0.7–4.0)
MCH: 31.5 pg (ref 26.0–34.0)
MCHC: 31.9 g/dL (ref 30.0–36.0)
MCV: 98.8 fL (ref 80.0–100.0)
Monocytes Absolute: 0.5 10*3/uL (ref 0.1–1.0)
Monocytes Relative: 9 %
Neutro Abs: 3.7 10*3/uL (ref 1.7–7.7)
Neutrophils Relative %: 71 %
Platelets: 153 10*3/uL (ref 150–400)
RBC: 3.27 MIL/uL — ABNORMAL LOW (ref 3.87–5.11)
RDW: 13.4 % (ref 11.5–15.5)
WBC: 5.2 10*3/uL (ref 4.0–10.5)
nRBC: 0 % (ref 0.0–0.2)

## 2021-04-19 LAB — URINE CULTURE

## 2021-04-19 LAB — CBG MONITORING, ED
Glucose-Capillary: 71 mg/dL (ref 70–99)
Glucose-Capillary: 112 mg/dL — ABNORMAL HIGH (ref 70–99)

## 2021-04-19 LAB — RPR: RPR Ser Ql: NONREACTIVE

## 2021-04-19 LAB — GLUCOSE, CAPILLARY: Glucose-Capillary: 127 mg/dL — ABNORMAL HIGH (ref 70–99)

## 2021-04-19 IMAGING — MR MR HEAD W/O CM
12 of 13 series · 44 of 48 positions shown · non-contrast
Comparison: CT head [DATE], no prior MRI

CLINICAL DATA: Mental status change, recent trauma

EXAM:
MRI HEAD WITHOUT CONTRAST
TECHNIQUE: Multiplanar, multiecho pulse sequences of the brain and surrounding
structures were obtained without intravenous contrast.

[Series 5: DWI · axial · 3.0mm · 0.88mm/px · z∈[-126,+10]mm · 7 of 96 slices shown (1 of 4)]
[im 1/96]
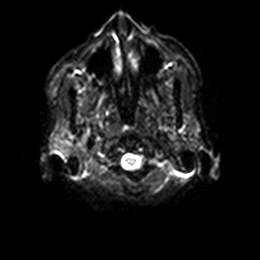
[im 16/96]
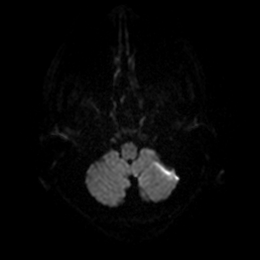
[im 32/96]
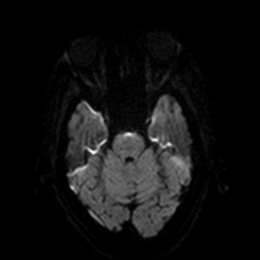
[im 48/96]
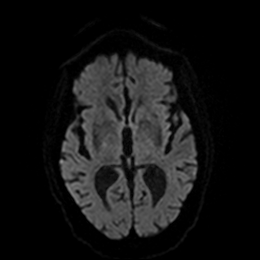
[im 64/96]
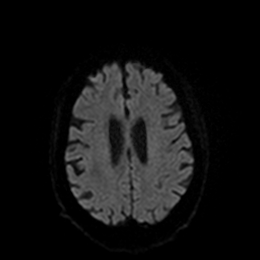
[im 80/96]
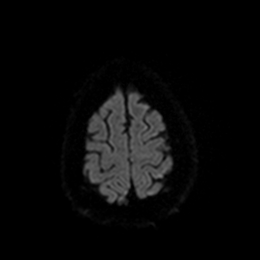
[im 96/96]
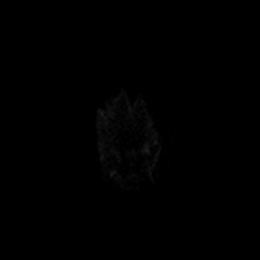

[Series 6: DWI · axial · 3.0mm · 0.88mm/px · z∈[-126,+10]mm · 4 of 48 slices shown (2 of 4)]
[im 1/48]
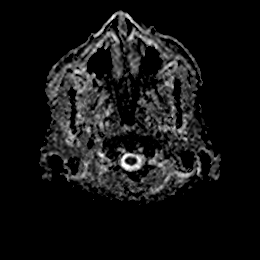
[im 16/48]
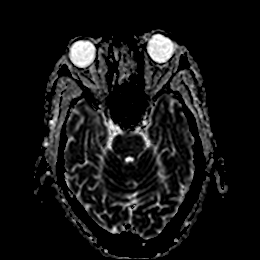
[im 32/48]
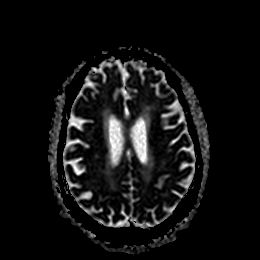
[im 48/48]
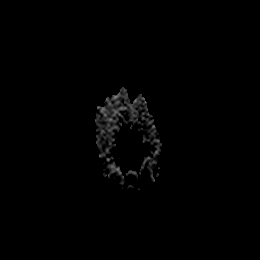

[Series 7: DWI · coronal · 4.0mm · 0.88mm/px · 6 of 72 slices shown (3 of 4)]
[im 1/72]
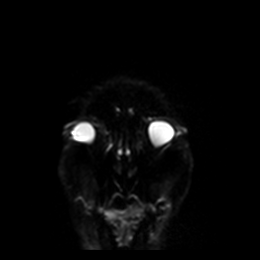
[im 15/72]
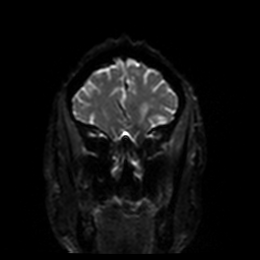
[im 29/72]
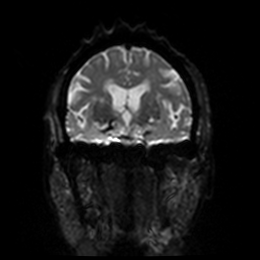
[im 43/72]
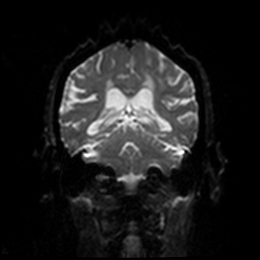
[im 57/72]
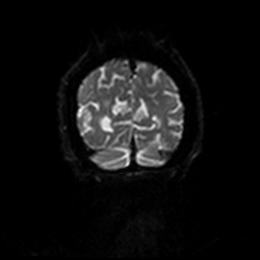
[im 72/72]
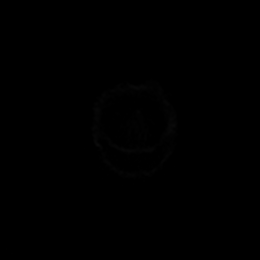

[Series 8: DWI · coronal · 4.0mm · 0.88mm/px · 3 of 36 slices shown (4 of 4)]
[im 1/36]
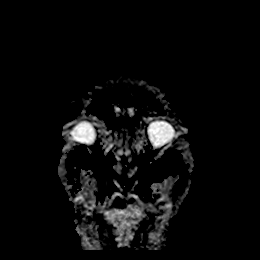
[im 18/36]
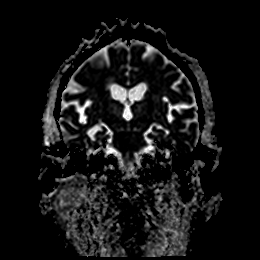
[im 36/36]
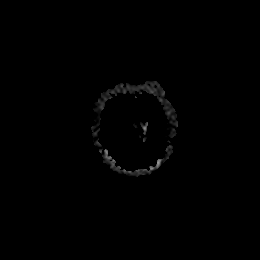

[Series 9: T1 · sagittal · 5.0mm · 0.75mm/px · 2 of 23 slices shown]
[im 1/23]
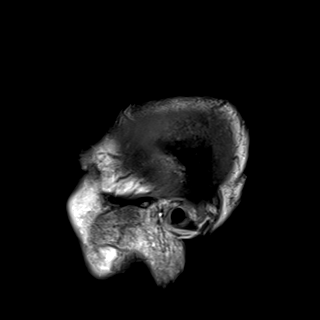
[im 23/23]
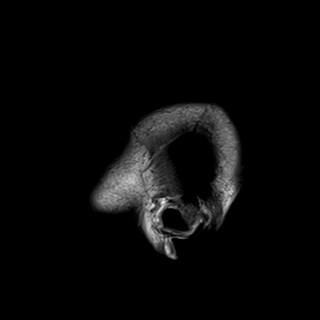

[Series 10: T2 · axial · 5.0mm · 0.72mm/px · z∈[-128,+11]mm · 2 of 25 slices shown (1 of 2)]
[im 1/25]
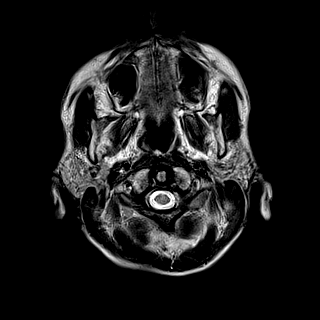
[im 25/25]
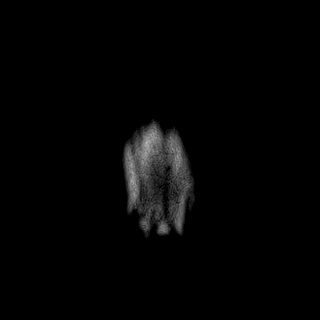

[Series 11: FLAIR · axial · 5.0mm · 0.45mm/px · z∈[-126,+13]mm · 2 of 25 slices shown]
[im 1/25]
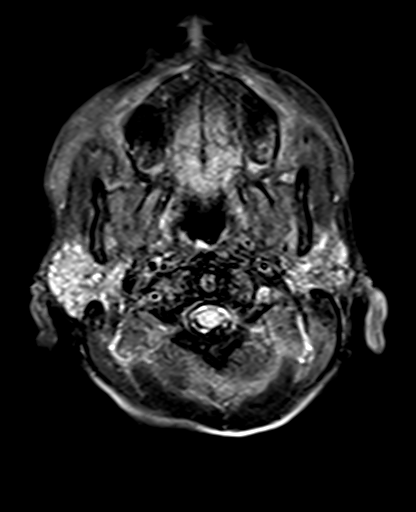
[im 25/25]
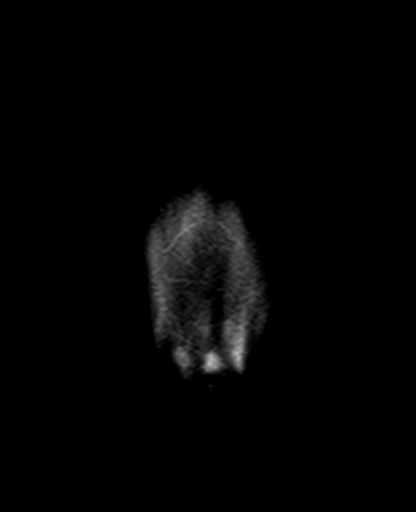

[Series 12: mag_images · axial · 3.0mm · 0.90mm/px · z∈[-131,+17]mm · 4 of 52 slices shown]
[im 1/52]
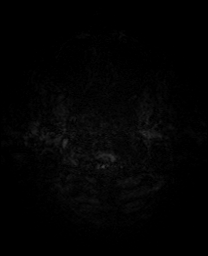
[im 18/52]
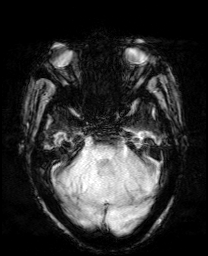
[im 35/52]
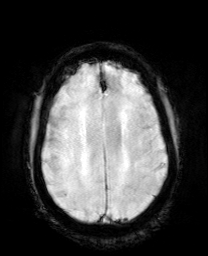
[im 52/52]
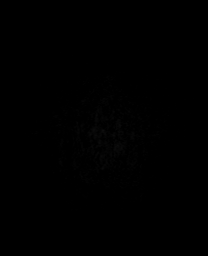

[Series 13: pha_images · axial · 3.0mm · 0.90mm/px · z∈[-131,+14]mm · 4 of 51 slices shown]
[im 1/51]
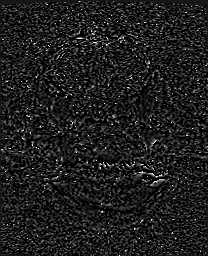
[im 17/51]
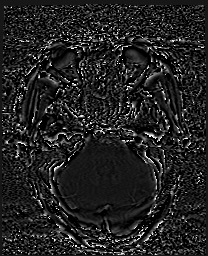
[im 34/51]
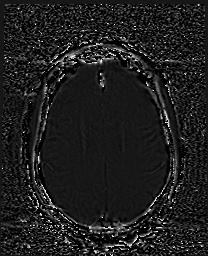
[im 51/51]
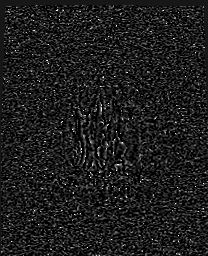

[Series 14: swi_images · axial · 3.0mm · 0.90mm/px · z∈[-131,+17]mm · 4 of 52 slices shown]
[im 1/52]
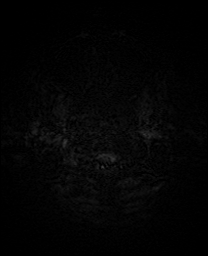
[im 18/52]
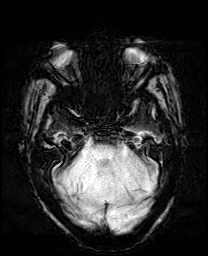
[im 35/52]
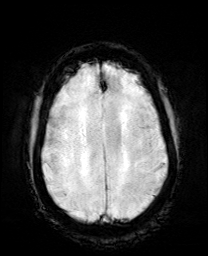
[im 52/52]
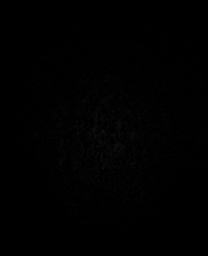

[Series 15: mip_images(sw) · axial · 24.0mm · 0.90mm/px · z∈[-121,+7]mm · 4 of 45 slices shown]
[im 1/45]
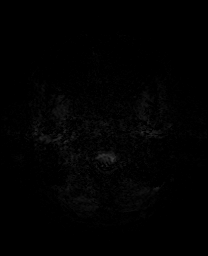
[im 15/45]
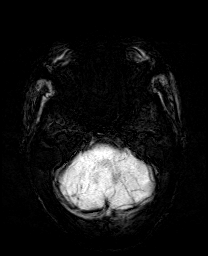
[im 30/45]
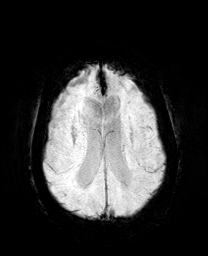
[im 45/45]
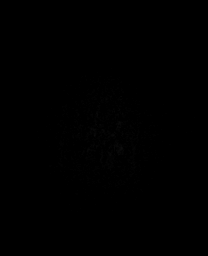

[Series 17: T2 · coronal · 5.0mm · 0.34mm/px · 2 of 29 slices shown (2 of 2)]
[im 1/29]
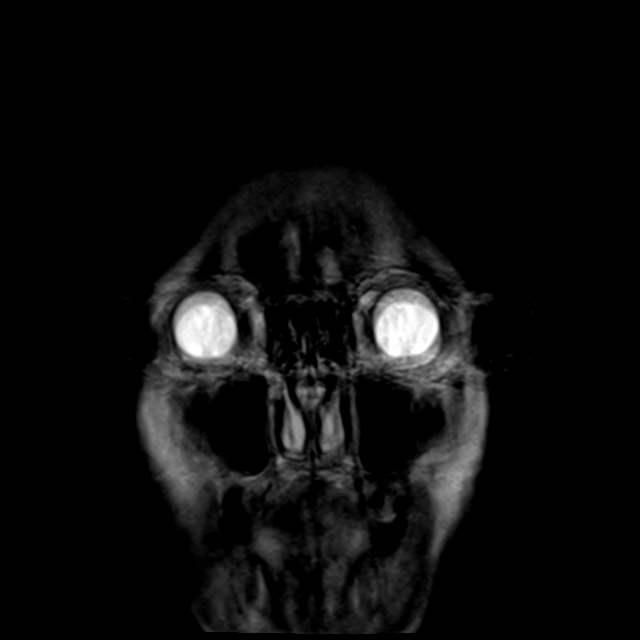
[im 29/29]
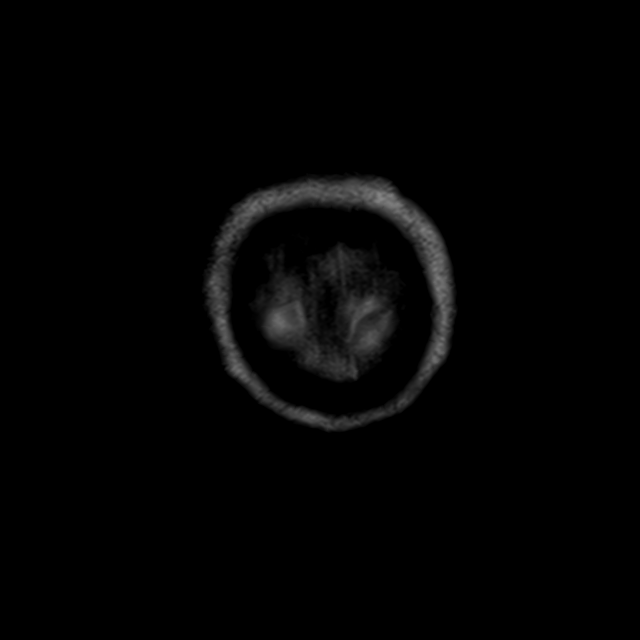

[44 of 48 positions shown; findings below may reference images not displayed]

FINDINGS: Brain: No acute infarction, hemorrhage, hydrocephalus, extra-axial
collection, or mass lesion. The ventricles and sulci are normal for
age. Confluent T2 hyperintense signal in the periventricular white
matter, likely the sequela of chronic small vessel ischemic disease.
Punctate focus of susceptibility in the right cerebellar hemisphere,
possibly the sequela of a punctate microhemorrhage. Additional
susceptibility along the falx, which correlates with calcifications
seen on the [DATE] CT. Remote lacunar infarct in right basal
ganglia.

Vascular: Normal flow voids.

Skull and upper cervical spine: Normal marrow signal.

Sinuses/Orbits: Negative.  Status post bilateral lens replacements.

Other: The mastoids are well aerated.
IMPRESSION: No acute osseous abnormality. No etiology is seen for the patient's
mental status change.

## 2021-04-19 MED ORDER — LORAZEPAM 0.5 MG PO TABS
0.5000 mg | ORAL_TABLET | Freq: Two times a day (BID) | ORAL | Status: DC | PRN
  Administered 2021-04-19 – 2021-04-21 (×3): 0.5 mg via ORAL
  Filled 2021-04-19 (×3): qty 1

## 2021-04-19 MED ORDER — HYDROXYZINE HCL 10 MG PO TABS
10.0000 mg | ORAL_TABLET | Freq: Three times a day (TID) | ORAL | Status: DC | PRN
  Administered 2021-04-19 – 2021-04-21 (×3): 10 mg via ORAL
  Filled 2021-04-19 (×5): qty 1

## 2021-04-19 MED ORDER — INSULIN ASPART 100 UNIT/ML IJ SOLN: 0.0000 [IU] | Freq: Three times a day (TID) | INTRAMUSCULAR | Status: DC

## 2021-04-19 NOTE — Progress Notes (Signed)
  Progress Note   Date: 04/19/2021  Patient Name: Penny [name]        MRN#: [id number]  Review the patient's clinical findings supports the diagnosis of:   ATN due to sepsis  Pt has AKI that was present on admission due to increase in her Scr of 1.06. pt's baseline Scr is 0.48

## 2021-04-19 NOTE — Progress Notes (Signed)
   04/19/21 1307  Clinical Encounter Type  Visited With Patient and family together  Visit Type Initial  Referral From Social work  Consult/Referral To [employer] received call from [employer] stating the patient missed placed her Advanced Directive and requesting to completed another. Patient said she wants to appoint her niece, [name] as her HCPOA. Chaplain provided AD education to the patient and [name]. [name] said she will notify the patient's nurse once the document is completed. This note was prepared by Jeanine Luz, M.Div..  For questions please contact by phone [phone].

## 2021-04-19 NOTE — ED Notes (Signed)
Pt continuing the remove leads. Mittens placed back on pt.

## 2021-04-19 NOTE — Evaluation (Signed)
Occupational Therapy Evaluation Patient Details Name: Penny [name] MRN: [id number] DOB: 06-Sep-1934 Today's Date: 04/19/2021   History of Present Illness This 86 y.o. female admitted after a fall at home and being found down for an unknown period of time.  She was noted to by hypothermic and had metabolic encephalopathy.  CK was elevated, and was noted to have pressure ulcer Rt shoulder blace, and Rt hip.  PMH includes: CKD   Clinical Impression   Pt admitted with above. She demonstrates the below listed deficits and will benefit from continued OT to maximize safety and independence with BADLs.  Pt presents to OT with generalized weakness, impaired balance, decreased activity tolerance, impaired cognition.  Pt requires min - max A for UB ADLs, and total A for LB ADLs.  She required mod - max A to move to EOB sitting.  PTA, pt lived alone in a senior citizens apartment complex and reports she was able to complete ADLs with mod I using RW.  Recommend SNF level rehab.        Recommendations for follow up therapy are one component of a multi-disciplinary discharge planning process, led by the attending physician.  Recommendations may be updated based on patient status, additional functional criteria and insurance authorization.   Follow Up Recommendations  SNF    Equipment Recommendations  None recommended by OT    Recommendations for Other Services       Precautions / Restrictions Precautions Precautions: Fall      Mobility Bed Mobility Overal bed mobility: Needs Assistance Bed Mobility: Supine to Sit;Sit to Supine     Supine to sit: Max assist Sit to supine: Mod assist   General bed mobility comments: pt requires assist to move LEs off and on the bed and to lift trunk.  Pt very distracted by itching causing her to frequently loose her balance    Transfers                 General transfer comment: unable    Balance Overall balance assessment: Needs  assistance Sitting-balance support: Feet unsupported;Single extremity supported Sitting balance-Leahy Scale: Poor Sitting balance - Comments: pt initially required mod A with heavy Rt and posterior lean, but progressed to min guard assist for brief period                                   ADL either performed or assessed with clinical judgement   ADL Overall ADL's : Needs assistance/impaired Eating/Feeding: Set up;Bed level   Grooming: Wash/dry hands;Wash/dry face;Oral care;Moderate assistance;Sitting   Upper Body Bathing: Moderate assistance;Bed level   Lower Body Bathing: Maximal assistance;Bed level   Upper Body Dressing : Maximal assistance;Sitting;Bed level   Lower Body Dressing: Total assistance;Bed level   Toilet Transfer: Total assistance   Toileting- Clothing Manipulation and Hygiene: Total assistance;Bed level       Functional mobility during ADLs: Moderate assistance;Maximal assistance (bed mobility) General ADL Comments: pt requires assist due to impaired balance and due to cognitive deficits     Vision Patient Visual Report: No change from baseline       Perception Perception Perception Tested?: Yes   Praxis Praxis Praxis tested?: Within functional limits    Pertinent Vitals/Pain Pain Assessment: Faces Faces Pain Scale: Hurts little more Pain Location: generealized Pain Descriptors / Indicators: Aching Pain Intervention(s): Monitored during session;Repositioned     Hand Dominance Right   Extremity/Trunk Assessment Upper  Extremity Assessment Upper Extremity Assessment: Generalized weakness   Lower Extremity Assessment Lower Extremity Assessment: Defer to PT evaluation   Cervical / Trunk Assessment Cervical / Trunk Assessment: Kyphotic   Communication Communication Communication: No difficulties   Cognition Arousal/Alertness: Awake/alert Behavior During Therapy: WFL for tasks assessed/performed Overall Cognitive Status:  Impaired/Different from baseline Area of Impairment: Attention;Following commands;Safety/judgement;Awareness;Problem solving                   Current Attention Level: Focused;Sustained   Following Commands: Follows one step commands inconsistently;Follows one step commands with increased time Safety/Judgement: Decreased awareness of safety;Decreased awareness of deficits Awareness: Intellectual Problem Solving: Slow processing;Difficulty sequencing;Requires verbal cues;Requires tactile cues General Comments: Pt very distracted by itching.  She required mod - max A to refocus on activity at hand.  She requires mod - max cues for safety   General Comments  VSS    Exercises     Shoulder Instructions      Home Living Family/patient expects to be discharged to:: Private residence Living Arrangements: Alone Available Help at Discharge: Family;Friend(s);Available PRN/intermittently Type of Home: Apartment Home Access: Elevator     Home Layout: One level     Bathroom Shower/Tub: Teacher, early years/pre: Standard     Home Equipment: Environmental consultant - 2 wheels;Walker - 4 wheels;Shower seat;Grab bars - toilet;Grab bars - tub/shower   Additional Comments: Pt lives in [place] apartments senior citizens apartments      Prior Functioning/Environment Level of Independence: Independent with assistive device(s)        Comments: Pt reports she ambulates with RW, and walks to her mailbox and to take her trash out.  She has a friend who takes her to the grocery store        OT Problem List: Decreased strength;Decreased activity tolerance;Impaired balance (sitting and/or standing);Decreased cognition;Decreased safety awareness;Decreased knowledge of use of DME or AE      OT Treatment/Interventions: Self-care/ADL training;Therapeutic exercise;Neuromuscular education;DME and/or AE instruction;Therapeutic activities;Cognitive remediation/compensation;Patient/family  education;Balance training    OT Goals(Current goals can be found in the care plan section) Acute Rehab OT Goals Patient Stated Goal: to be able to sit up for a while OT Goal Formulation: With patient Time For Goal Achievement: 05/03/21 Potential to Achieve Goals: Good ADL Goals Pt Will Perform Eating: with modified independence;sitting Pt Will Perform Grooming: with set-up;with supervision;sitting Pt Will Perform Upper Body Bathing: with set-up;with supervision;sitting Pt Will Transfer to Toilet: with mod assist;stand pivot transfer;bedside commode  OT Frequency: Min 2X/week   Barriers to D/C: Decreased caregiver support          Co-evaluation              AM-PAC OT "6 Clicks" Daily Activity     Outcome Measure Help from another person eating meals?: A Little Help from another person taking care of personal grooming?: A Little Help from another person toileting, which includes using toliet, bedpan, or urinal?: Total Help from another person bathing (including washing, rinsing, drying)?: A Lot Help from another person to put on and taking off regular upper body clothing?: A Lot Help from another person to put on and taking off regular lower body clothing?: Total 6 Click Score: 12   End of Session Nurse Communication: Mobility status  Activity Tolerance: Patient tolerated treatment well Patient left: in bed;with call bell/phone within reach  OT Visit Diagnosis: Unsteadiness on feet (R26.81);Cognitive communication deficit (R41.841)  Time: 2883-3744 OT Time Calculation (min): 22 min Charges:  OT General Charges $OT Visit: 1 Visit OT Evaluation $OT Eval Moderate Complexity: 1 Mod  Nilsa Nutting., OTR/L Acute Rehabilitation Services Pager [phone] Office [phone]   Lucille Passy M 04/19/2021, 5:25 PM

## 2021-04-19 NOTE — Evaluation (Signed)
Physical Therapy Evaluation Patient Details Name: Penny [name] MRN: [id number] DOB: [birth year] Today's Date: 04/19/2021  History of Present Illness  This 86 y.o. female admitted after a fall at home and being found down for an unknown period of time.  She was noted to by hypothermic and had metabolic encephalopathy.  CK was elevated, and was noted to have pressure ulcer Rt shoulder blace, and Rt hip.  PMH includes: CKD  Clinical Impression  Pt admitted with/for hypothermia/metabolic encephalopathy after being found down on the floor for unknown length of time.  Pt mobilizing at a moderate assist level in the bed..  Pt currently limited functionally due to the problems listed. ( See problems list.)   Pt will benefit from PT to maximize function and safety in order to get ready for next venue listed below.        Recommendations for follow up therapy are one component of a multi-disciplinary discharge planning process, led by the attending physician.  Recommendations may be updated based on patient status, additional functional criteria and insurance authorization.  Follow Up Recommendations SNF;Supervision/Assistance - 24 hour    Equipment Recommendations       Recommendations for Other Services       Precautions / Restrictions Precautions Precautions: Fall      Mobility  Bed Mobility Overal bed mobility: Needs Assistance Bed Mobility: Supine to Sit;Sit to Supine     Supine to sit: Max assist;Mod assist Sit to supine: Mod assist   General bed mobility comments: pt requires assist to move LEs off and on the bed and to lift trunk.  cues for sequencing    Transfers                 General transfer comment: unable,  Pt deferred trying today  Ambulation/Gait                Stairs            Wheelchair Mobility    Modified Rankin (Stroke Patients Only)       Balance Overall balance assessment: Needs assistance Sitting-balance support: Feet  unsupported;Single extremity supported Sitting balance-Leahy Scale: Poor Sitting balance - Comments: pt initially required mod A with heavy Rt and posterior lean, but progressed to min guard assist without UE assist                                     Pertinent Vitals/Pain Pain Assessment: Faces Faces Pain Scale: Hurts little more Pain Location: generealized Pain Descriptors / Indicators: Aching Pain Intervention(s): Monitored during session;Repositioned    Home Living Family/patient expects to be discharged to:: Private residence Living Arrangements: Alone Available Help at Discharge: Family;Friend(s);Available PRN/intermittently Type of Home: Apartment Home Access: Elevator     Home Layout: One level Home Equipment: Walker - 2 wheels;Walker - 4 wheels;Shower seat;Grab bars - toilet;Grab bars - tub/shower Additional Comments: Pt lives in [place] apartments senior citizens apartments    Prior Function Level of Independence: Independent with assistive device(s)         Comments: Pt reports she ambulates with RW, and walks to her mailbox and to take her trash out.  She has a friend who takes her to the grocery store     [place]: Right    Extremity/Trunk Assessment   Upper Extremity Assessment Upper Extremity Assessment: Generalized weakness    Lower Extremity Assessment  Lower Extremity Assessment: Generalized weakness;RLE deficits/detail;LLE deficits/detail RLE Deficits / Details: attained approx 50* knee flexion bilaterally actively assisting,  bil 90* actively sitting EOB RLE Coordination: decreased fine motor LLE Deficits / Details: similar to R UE    Cervical / Trunk Assessment Cervical / Trunk Assessment: Kyphotic  Communication   Communication: No difficulties  Cognition Arousal/Alertness: Awake/alert Behavior During Therapy: WFL for tasks assessed/performed Overall Cognitive Status: Impaired/Different from  baseline Area of Impairment: Attention;Following commands;Safety/judgement;Awareness;Problem solving                   Current Attention Level: Focused;Sustained   Following Commands: Follows one step commands inconsistently;Follows one step commands with increased time Safety/Judgement: Decreased awareness of safety;Decreased awareness of deficits Awareness: Intellectual Problem Solving: Slow processing;Difficulty sequencing;Requires verbal cues;Requires tactile cues General Comments: Pt very distracted by itching.  She required mod - max A to refocus on activity at hand.  She requires mod - max cues for safety      General Comments General comments (skin integrity, edema, etc.): vss    Exercises     Assessment/Plan    PT Assessment Patient needs continued PT services  PT Problem List Decreased strength       PT Treatment Interventions Gait training;Functional mobility training;Therapeutic activities;Therapeutic exercise;Balance training;Patient/family education;DME instruction    PT Goals (Current goals can be found in the Care Plan section)  Acute Rehab PT Goals Patient Stated Goal: to be able to sit up for a while Time For Goal Achievement: 05/03/21 Potential to Achieve Goals: Good    Frequency Min 2X/week   Barriers to discharge        Co-evaluation               AM-PAC PT "6 Clicks" Mobility  Outcome Measure   Help needed moving from lying on your back to sitting on the side of a flat bed without using bedrails?: A Lot Help needed moving to and from a bed to a chair (including a wheelchair)?: A Lot Help needed standing up from a chair using your arms (e.g., wheelchair or bedside chair)?: A Lot Help needed to walk in hospital room?: Total Help needed climbing 3-5 steps with a railing? : Total 6 Click Score: 8    End of Session   Activity Tolerance: Patient tolerated treatment well Patient left: in bed;with call bell/phone within reach   PT  Visit Diagnosis: Other abnormalities of gait and mobility (R26.89);Muscle weakness (generalized) (M62.81);Difficulty in walking, not elsewhere classified (R26.2);Pain Pain - Right/Left: Right Pain - part of body: Shoulder;Hip    Time: 0938-1829 PT Time Calculation (min) (ACUTE ONLY): 21 min   Charges:   PT Evaluation $PT Eval Moderate Complexity: 1 Mod          04/19/2021  Ginger Carne., PT Acute Rehabilitation Services [phone]  (pager) [phone]  (office)  Tessie Fass Jasraj Lappe 04/19/2021, 6:07 PM

## 2021-04-19 NOTE — ED Notes (Signed)
Lunch ordered 

## 2021-04-19 NOTE — Progress Notes (Signed)
PROGRESS NOTE    Penny [name]  HBZ:[id number] DOB: [birth year] DOA: 04/17/2021 PCP: Kelton Pillar, MD    Chief Complaint  Patient presents with   Fall    Brief Narrative:  86 year old F with PMH of DM-2, PAD, CKD-3 and possible cognitive impairment brought to ED after found down at home for unknown period of time and found to be hypothermic with core temp to 94.6 F and admitted for hypothermia and acute metabolic encephalopathy.  Last time seen well on 04/15/2021.  Has mildly elevated CK to 351.  CTh, CT cervical spine and multiple x-rays without significant finding. Assessment & Plan:   Principal Problem:   Hypothermia due to exposure Active Problems:   Type 2 diabetes mellitus with vascular disease (HCC)   Pressure ulcer, shoulder blades, stage II (HCC) - right posterior shoulder   Pressure ulcer, hip, right, unstageable (HCC)   CKD (chronic kidney disease) stage 3, GFR 30-59 ml/min (HCC)   Acute metabolic encephalopathy   Hypothermia Resolved Acute metabolic encephalopathy with underlying cognitive impairment , currently low-grade temperatures probably secondary to UTI. Urine analysis abnormal Blood cultures showing staph capitis in the aerobic bottle possibly a contaminant, will repeat blood cultures .  Resume Rocephin for now UDS is negative Vitamin B12 is elevated TSH within normal limits RPR is nonreactive Ammonia level within normal limits   Dehydration/mild rhabdomyolysis Improved.    Mild anemia of chronic disease Continue to monitor.   Type 2 diabetes mellitus Last A1c is 5.6 Non-insulin-dependent CBG (last 3)  Recent Labs    04/18/21 2357 04/19/21 0817 04/19/21 1210  GLUCAP 138* 71 112*   Continue with sliding scale insulin.   Right posterior shoulder and right hip deep tissue pressure injury present on admission -  Right hip 9 x 8 and is purple/maroon. Right shoulder is 5 x 1.5 pink and tan -Foam dressing over both wounds placed by wound  care -Turn the patient every 2 hours.   DVT prophylaxis: Heparin Code Status: Full code  family Communication: None at bedside  disposition:   Status is: Inpatient  Remains inpatient appropriate because:Unsafe d/c plan and IV treatments appropriate due to intensity of illness or inability to take PO  Dispo: The patient is from: Home              Anticipated d/c is to:  Pending              Patient currently is not medically stable to d/c.   Difficult to place patient No       Consultants:  None  Procedures: None Antimicrobials: Rocephin for UTI   Subjective: No new complaints  Objective: Vitals:   04/19/21 0950 04/19/21 1000 04/19/21 1100 04/19/21 1332  BP: (!) 173/89 (!) 179/90 (!) 170/90 (!) 167/117  Pulse:  64 (!) 59   Resp:  18 18   Temp:      TempSrc:      SpO2:  99% 98%   Weight:      Height:        Intake/Output Summary (Last 24 hours) at 04/19/2021 1356 Last data filed at 04/18/2021 1939 Gross per 24 hour  Intake 116.06 ml  Output --  Net 116.06 ml   Filed Weights   04/19/21 0800  Weight: 63.5 kg    Examination:  General exam: Appears calm and comfortable  Respiratory system: Clear to auscultation. Respiratory effort normal. Cardiovascular system: S1 & S2 heard, RRR. No JVD,  No pedal edema. Gastrointestinal system: Abdomen  is nondistended, soft and nontender. Normal bowel sounds heard. Central nervous system: Alert and oriented to person and place only  extremities: No pedal edema Skin: No rashes, lesions or ulcers Psychiatry: Mood & affect appropriate.     Data Reviewed: I have personally reviewed following labs and imaging studies  CBC: Recent Labs  Lab 04/17/21 2208 04/18/21 0708  WBC 6.7 6.6  NEUTROABS 5.7 5.6  HGB 12.0 10.8*  HCT 37.8 34.2*  MCV 98.7 99.4  PLT 186 384    Basic Metabolic Panel: Recent Labs  Lab 04/17/21 2208 04/18/21 0817 04/18/21 1011  NA 139 149* 141  K 3.5 2.3* 3.8  CL 103 >130* 107  CO2 26  12* 24  GLUCOSE 106* 38* 101*  BUN 27* 16 29*  CREATININE 1.06* 0.48 1.14*  CALCIUM 9.1 4.0* 8.5*  MG  --  0.8* 1.8  PHOS  --  1.7* 3.5    GFR: Estimated Creatinine Clearance: 33.1 mL/min (A) (by C-G formula based on SCr of 1.14 mg/dL (H)).  Liver Function Tests: Recent Labs  Lab 04/17/21 2208 04/18/21 0817 04/18/21 1011  AST 40  --   --   ALT 22  --   --   ALKPHOS 60  --   --   BILITOT 1.3*  --   --   PROT 7.7  --   --   ALBUMIN 2.7* <1.5* 2.4*    CBG: Recent Labs  Lab 04/18/21 1644 04/18/21 1652 04/18/21 2357 04/19/21 0817 04/19/21 1210  GLUCAP 96 85 138* 71 112*     Recent Results (from the past 240 hour(s))  Blood Culture (routine x 2)     Status: None (Preliminary result)   Collection Time: 04/17/21 10:00 PM   Specimen: BLOOD RIGHT WRIST  Result Value Ref Range Status   Specimen Description BLOOD RIGHT WRIST  Final   Special Requests   Final    BOTTLES DRAWN AEROBIC AND ANAEROBIC Blood Culture adequate volume   Culture   Final    NO GROWTH 2 DAYS Performed at [facility], [place] [address], [place], [place] [address]    Report Status PENDING  Incomplete  Blood Culture (routine x 2)     Status: Abnormal (Preliminary result)   Collection Time: 04/17/21 10:08 PM   Specimen: BLOOD  Result Value Ref Range Status   Specimen Description BLOOD LEFT ANTECUBITAL  Final   Special Requests   Final    BOTTLES DRAWN AEROBIC AND ANAEROBIC Blood Culture results may not be optimal due to an inadequate volume of blood received in culture bottles   Culture  Setup Time   Final    GRAM POSITIVE COCCI IN CLUSTERS ANAEROBIC BOTTLE ONLY CRITICAL RESULT CALLED TO, READ BACK BY AND VERIFIED WITH: PHARMD LISA CURRAN 04/18/21 @2050  BY JW    Culture (A)  Final    STAPHYLOCOCCUS CAPITIS THE SIGNIFICANCE OF ISOLATING THIS ORGANISM FROM A SINGLE SET OF BLOOD CULTURES WHEN MULTIPLE SETS ARE DRAWN IS UNCERTAIN. PLEASE NOTIFY THE MICROBIOLOGY DEPARTMENT WITHIN ONE WEEK IF  SPECIATION AND SENSITIVITIES ARE REQUIRED. Performed at [facility], [place] [address] [place], [place] [address]    Report Status PENDING  Incomplete  Blood Culture ID Panel (Reflexed)     Status: Abnormal   Collection Time: 04/17/21 10:08 PM  Result Value Ref Range Status   Enterococcus faecalis NOT DETECTED NOT DETECTED Final   Enterococcus Faecium NOT DETECTED NOT DETECTED Final   Listeria monocytogenes NOT DETECTED NOT DETECTED  Final   Staphylococcus species DETECTED (A) NOT DETECTED Final    Comment: CRITICAL RESULT CALLED TO, READ BACK BY AND VERIFIED WITH: PHARMD LISA CURRAN 04/18/21 @2050  BY JW    Staphylococcus aureus (BCID) NOT DETECTED NOT DETECTED Final   Staphylococcus epidermidis NOT DETECTED NOT DETECTED Final   Staphylococcus lugdunensis NOT DETECTED NOT DETECTED Final   Streptococcus species NOT DETECTED NOT DETECTED Final   Streptococcus agalactiae NOT DETECTED NOT DETECTED Final   Streptococcus pneumoniae NOT DETECTED NOT DETECTED Final   Streptococcus pyogenes NOT DETECTED NOT DETECTED Final   A.calcoaceticus-baumannii NOT DETECTED NOT DETECTED Final   Bacteroides fragilis NOT DETECTED NOT DETECTED Final   Enterobacterales NOT DETECTED NOT DETECTED Final   Enterobacter cloacae complex NOT DETECTED NOT DETECTED Final   Escherichia coli NOT DETECTED NOT DETECTED Final   Klebsiella aerogenes NOT DETECTED NOT DETECTED Final   Klebsiella oxytoca NOT DETECTED NOT DETECTED Final   Klebsiella pneumoniae NOT DETECTED NOT DETECTED Final   Proteus species NOT DETECTED NOT DETECTED Final   Salmonella species NOT DETECTED NOT DETECTED Final   Serratia marcescens NOT DETECTED NOT DETECTED Final   Haemophilus influenzae NOT DETECTED NOT DETECTED Final   Neisseria meningitidis NOT DETECTED NOT DETECTED Final   Pseudomonas aeruginosa NOT DETECTED NOT DETECTED Final   Stenotrophomonas maltophilia NOT DETECTED NOT DETECTED Final   Candida albicans NOT DETECTED NOT DETECTED  Final   Candida auris NOT DETECTED NOT DETECTED Final   Candida glabrata NOT DETECTED NOT DETECTED Final   Candida krusei NOT DETECTED NOT DETECTED Final   Candida parapsilosis NOT DETECTED NOT DETECTED Final   Candida tropicalis NOT DETECTED NOT DETECTED Final   Cryptococcus neoformans/gattii NOT DETECTED NOT DETECTED Final    Comment: Performed at [facility], [address] [place], [place] [address]  Resp Panel by RT-PCR (Flu A&B, Covid) Nasopharyngeal Swab     Status: None   Collection Time: 04/17/21 10:10 PM   Specimen: Nasopharyngeal Swab; Nasopharyngeal(NP) swabs in vial transport medium  Result Value Ref Range Status   SARS Coronavirus 2 by RT PCR NEGATIVE NEGATIVE Final    Comment: (NOTE) SARS-CoV-2 target nucleic acids are NOT DETECTED.  The SARS-CoV-2 RNA is generally detectable in upper respiratory specimens during the acute phase of infection. The lowest concentration of SARS-CoV-2 viral copies this assay can detect is 138 copies/mL. A negative result does not preclude SARS-Cov-2 infection and should not be used as the sole basis for treatment or other patient management decisions. A negative result may occur with  improper specimen collection/handling, submission of specimen other than nasopharyngeal swab, presence of viral mutation(s) within the areas targeted by this assay, and inadequate number of viral copies(<138 copies/mL). A negative result must be combined with clinical observations, patient history, and epidemiological information. The expected result is Negative.  Fact Sheet for Patients:  EntrepreneurPulse.com.au  Fact Sheet for Healthcare Providers:  IncredibleEmployment.be  This test is no t yet approved or cleared by the [place] FDA and  has been authorized for detection and/or diagnosis of SARS-CoV-2 by FDA under an Emergency Use Authorization (EUA). This EUA will remain  in effect (meaning this test  can be used) for the duration of the COVID-19 declaration under Section 564(b)(1) of the Act, 21 U.S.C.section 360bbb-3(b)(1), unless the authorization is terminated  or revoked sooner.       Influenza A by PCR NEGATIVE NEGATIVE Final   Influenza B by PCR NEGATIVE NEGATIVE Final    Comment: (NOTE) The Xpert  Xpress SARS-CoV-2/FLU/RSV plus assay is intended as an aid in the diagnosis of influenza from Nasopharyngeal swab specimens and should not be used as a sole basis for treatment. Nasal washings and aspirates are unacceptable for Xpert Xpress SARS-CoV-2/FLU/RSV testing.  Fact Sheet for Patients: EntrepreneurPulse.com.au  Fact Sheet for Healthcare Providers: IncredibleEmployment.be  This test is not yet approved or cleared by the [place] FDA and has been authorized for detection and/or diagnosis of SARS-CoV-2 by FDA under an Emergency Use Authorization (EUA). This EUA will remain in effect (meaning this test can be used) for the duration of the COVID-19 declaration under Section 564(b)(1) of the Act, 21 U.S.C. section 360bbb-3(b)(1), unless the authorization is terminated or revoked.  Performed at [facility], [place] [address] [place], [place] [address]   Urine Culture     Status: Abnormal   Collection Time: 04/18/21  1:12 PM   Specimen: In/Out Cath Urine  Result Value Ref Range Status   Specimen Description IN/OUT CATH URINE  Final   Special Requests   Final    NONE Performed at [facility], [place] [address] [place], [place] [address]    Culture MULTIPLE SPECIES PRESENT, SUGGEST RECOLLECTION (A)  Final   Report Status 04/19/2021 FINAL  Final         Radiology Studies: DG Ribs Unilateral W/Chest Right  Result Date: 04/17/2021 CLINICAL DATA:  Unwitnessed fall. EXAM: RIGHT RIBS AND CHEST - 3+ VIEW COMPARISON:  None. FINDINGS: No fracture or other bone lesions are seen involving the ribs. Degenerative changes are  seen throughout the thoracic spine. There is no evidence of pneumothorax or pleural effusion. Both lungs are clear. Heart size and mediastinal contours are within normal limits. IMPRESSION: No acute osseous abnormality. Electronically Signed   By: Virgina Norfolk M.D.   On: 04/17/2021 23:30   DG Shoulder Right  Result Date: 04/17/2021 CLINICAL DATA:  Unwitnessed fall. EXAM: RIGHT SHOULDER - 2+ VIEW COMPARISON:  None. FINDINGS: There is no evidence of an acute fracture or dislocation. Chronic deformities are seen involving the inferolateral aspect of the right humeral head. A chronic deformity of the greater tubercle is also seen seen. Moderate to marked severity degenerative changes are seen involving the right acromioclavicular joint and right glenohumeral articulation. Soft tissues are unremarkable. IMPRESSION: 1. Degenerative changes without evidence of an acute fracture or dislocation. 2. Chronic deformities of the right humeral head and right greater tubercle. Electronically Signed   By: Virgina Norfolk M.D.   On: 04/17/2021 23:31   CT Head Wo Contrast  Result Date: 04/17/2021 CLINICAL DATA:  Polytrauma. EXAM: CT HEAD WITHOUT CONTRAST CT CERVICAL SPINE WITHOUT CONTRAST TECHNIQUE: Multidetector CT imaging of the head and cervical spine was performed following the standard protocol without intravenous contrast. Multiplanar CT image reconstructions of the cervical spine were also generated. COMPARISON:  None. FINDINGS: CT HEAD FINDINGS Brain: No evidence of acute infarction, hemorrhage, hydrocephalus, extra-axial collection or mass lesion/mass effect. There is mild periventricular white matter hypodensity, likely chronic small vessel ischemic change. There is mild diffuse atrophy. Vascular: Atherosclerotic calcifications are present within the cavernous internal carotid arteries. Skull: Normal. Negative for fracture or focal lesion. Sinuses/Orbits: No acute finding. Other: There is right facial and  right frontal soft tissue swelling. There are punctate skin densities, likely calcifications of the face bilaterally. CT CERVICAL SPINE FINDINGS Alignment: Normal. Skull base and vertebrae: No acute fracture. No primary bone lesion or focal pathologic process. Soft tissues and spinal canal: No prevertebral fluid  or swelling. No visible canal hematoma. Disc levels: There is disc space narrowing and endplate osteophyte formation at C5-C6 and C7-T1 compatible with degenerative change. There is scattered mild neural foraminal stenosis bilaterally. Disc bulge at C7-T1 causes moderate central canal stenosis. Upper chest: There is a 3 mm nodule in the left upper lobe image 5/90. Other: There is a heavily calcified left thyroid nodule measuring 9 mm. There is a 5 mm hypodense left thyroid nodule. IMPRESSION: 1.  No acute intracranial process. 2. No acute fracture or traumatic subluxation of the cervical spine. 3. 3 mm nodule in the left upper lobe. If patient is at low risk for bronchus carcinoma no follow-up is necessary. The patient is at high risk a follow-up CT is recommended in 12 months to reassess. Electronically Signed   By: Ronney Asters M.D.   On: 04/17/2021 22:52   CT Cervical Spine Wo Contrast  Result Date: 04/17/2021 CLINICAL DATA:  Polytrauma. EXAM: CT HEAD WITHOUT CONTRAST CT CERVICAL SPINE WITHOUT CONTRAST TECHNIQUE: Multidetector CT imaging of the head and cervical spine was performed following the standard protocol without intravenous contrast. Multiplanar CT image reconstructions of the cervical spine were also generated. COMPARISON:  None. FINDINGS: CT HEAD FINDINGS Brain: No evidence of acute infarction, hemorrhage, hydrocephalus, extra-axial collection or mass lesion/mass effect. There is mild periventricular white matter hypodensity, likely chronic small vessel ischemic change. There is mild diffuse atrophy. Vascular: Atherosclerotic calcifications are present within the cavernous internal carotid  arteries. Skull: Normal. Negative for fracture or focal lesion. Sinuses/Orbits: No acute finding. Other: There is right facial and right frontal soft tissue swelling. There are punctate skin densities, likely calcifications of the face bilaterally. CT CERVICAL SPINE FINDINGS Alignment: Normal. Skull base and vertebrae: No acute fracture. No primary bone lesion or focal pathologic process. Soft tissues and spinal canal: No prevertebral fluid or swelling. No visible canal hematoma. Disc levels: There is disc space narrowing and endplate osteophyte formation at C5-C6 and C7-T1 compatible with degenerative change. There is scattered mild neural foraminal stenosis bilaterally. Disc bulge at C7-T1 causes moderate central canal stenosis. Upper chest: There is a 3 mm nodule in the left upper lobe image 5/90. Other: There is a heavily calcified left thyroid nodule measuring 9 mm. There is a 5 mm hypodense left thyroid nodule. IMPRESSION: 1.  No acute intracranial process. 2. No acute fracture or traumatic subluxation of the cervical spine. 3. 3 mm nodule in the left upper lobe. If patient is at low risk for bronchus carcinoma no follow-up is necessary. The patient is at high risk a follow-up CT is recommended in 12 months to reassess. Electronically Signed   By: Ronney Asters M.D.   On: 04/17/2021 22:52   DG Pelvis Portable  Result Date: 04/17/2021 CLINICAL DATA:  Unwitnessed fall. EXAM: PORTABLE PELVIS 1-2 VIEWS COMPARISON:  None. FINDINGS: There is no evidence of an acute pelvic fracture or diastasis. Marked severity degenerative changes are seen involving both hips, in the form of joint space narrowing and acetabular sclerosis. A large amount of stool is seen throughout the colon. IMPRESSION: 1. No acute fracture or diastasis. 2. Marked severity degenerative changes of both hips. Electronically Signed   By: Virgina Norfolk M.D.   On: 04/17/2021 23:25   DG Knee Complete 4 Views Left  Result Date:  04/17/2021 CLINICAL DATA:  Unwitnessed fall. EXAM: LEFT KNEE - COMPLETE 4+ VIEW COMPARISON:  None. FINDINGS: No evidence of an acute fracture or dislocation. Marked severity narrowing of the medial  and lateral tibiofemoral compartment spaces is seen. Marked severity patellofemoral narrowing is also noted. Marked severity vascular calcification is seen. There is a small joint effusion. IMPRESSION: 1. No evidence of acute fracture or dislocation. 2. Severe tricompartmental degenerative changes. 3. Small joint effusion. Electronically Signed   By: Virgina Norfolk M.D.   On: 04/17/2021 23:28   DG Knee Complete 4 Views Right  Result Date: 04/17/2021 CLINICAL DATA:  Unwitnessed fall. EXAM: RIGHT KNEE - COMPLETE 4+ VIEW COMPARISON:  None. FINDINGS: No evidence of an acute fracture. Mild chronic appearing lateral subluxation of the right tibia is noted with respect to the distal right femur. Marked severity medial and lateral tibiofemoral compartment space narrowing is seen. Marked severity patellofemoral narrowing is also noted. There is moderate to marked severity vascular calcification. A very small joint effusion is suspected. IMPRESSION: 1. No evidence of acute osseous abnormality. 2. Severe tricompartmental degenerative changes. 3. Very small joint effusion. Electronically Signed   By: Virgina Norfolk M.D.   On: 04/17/2021 23:29   DG Humerus Right  Result Date: 04/17/2021 CLINICAL DATA:  Unwitnessed fall. EXAM: RIGHT HUMERUS - 2+ VIEW COMPARISON:  None. FINDINGS: There is no evidence of acute fracture. Mild superior subluxation of the right humeral head is seen with respect to the right glenoid. Mild to moderate severity degenerative changes are also seen involving the right shoulder and right acromioclavicular joint. Soft tissues are unremarkable. IMPRESSION: 1. No acute fracture. 2. Mild superior subluxation of the right humeral head which may be chronic in nature. 3. Mild to moderate severity  degenerative changes. Electronically Signed   By: Virgina Norfolk M.D.   On: 04/17/2021 23:27        Scheduled Meds:  aspirin  81 mg Oral Daily   diltiazem  240 mg Oral Daily   heparin  5,000 Units Subcutaneous Q8H   hydrALAZINE  25 mg Oral Q8H   insulin aspart  0-6 Units Subcutaneous TID WC   multivitamin with minerals  1 tablet Oral Daily   senna  1 tablet Oral BID   Continuous Infusions:  cefTRIAXone (ROCEPHIN)  IV 1 g (04/19/21 1331)     LOS: 2 days       Hosie Poisson, MD [facility]   To contact the attending provider between 7A-7P or the covering provider during after hours 7P-7A, please log into the web site [email or web address] and access using universal [place] password for that web site. If you do not have the password, please call the hospital operator.  04/19/2021, 1:56 PM

## 2021-04-19 NOTE — Progress Notes (Signed)
Occupational Therapy Evaluation completed - full note to follow.  Pt currently requires mod - max A for bed mobility, and mod A - min guard for static sitting EOB.  She requires max - total A for ADLs.  Recommend SNF.   Nilsa Nutting., OTR/L Acute Rehabilitation Services Pager [phone] Office [phone]

## 2021-04-19 NOTE — ED Notes (Signed)
ED TO INPATIENT HANDOFF REPORT  ED Nurse Name and Phone #: [place] Name/Age/Gender Penny Hua D [name] 86 y.o. female Room/Bed: 046C/046C  Code Status   Code Status: Full Code  Home/SNF/Other Home Patient oriented to: self, place, time, and situation Is this baseline?  Yes but that comes and goes per niece   Triage Complete: Triage complete  Chief Complaint Hypothermia due to exposure [T68.XXXA] Acute metabolic encephalopathy [O27.03]  Triage Note Pt BIB [facility] from home, where she lives alone. Pt reports a fall on Saturday morning, was unable to get up. Pt found today by a neighbor. Pt alert to self, place, time and situation. Incontinent urine. C/o right wrist, knee and low back pain.   Allergies Allergies  Allergen Reactions   Glipizide Rash    Level of Care/Admitting Diagnosis ED Disposition     ED Disposition  Admit   Condition  --   [place] [100100]  Level of Care: Telemetry Medical [104]  May admit patient to [facility] or [facility] if equivalent level of care is available:: No  Covid Evaluation: Confirmed COVID Negative  Diagnosis: Acute metabolic encephalopathy [5009381]  Admitting Physician: Mercy Riding [[id number]]  Attending Physician: Mercy Riding [[id number]]  Estimated length of stay: past midnight tomorrow  Certification:: I certify this patient will need inpatient services for at least 2 midnights          B Medical/Surgery History Past Medical History:  Diagnosis Date   CKD (chronic kidney disease) stage 3, GFR 30-59 ml/min ([place]) 04/17/2021   No past surgical history on file.   A IV Location/Drains/Wounds Patient Lines/Drains/Airways Status     Active Line/Drains/Airways     Name Placement date Placement time Site Days   Peripheral IV 04/19/21 20 G Left Antecubital 04/19/21  0400  Antecubital  less than 1            Intake/Output Last 24 hours  Intake/Output Summary (Last  24 hours) at 04/19/2021 1354 Last data filed at 04/18/2021 1939 Gross per 24 hour  Intake 116.06 ml  Output --  Net 116.06 ml    Labs/Imaging Results for orders placed or performed during the hospital encounter of 04/17/21 (from the past 48 hour(s))  Blood Culture (routine x 2)     Status: None (Preliminary result)   Collection Time: 04/17/21 10:00 PM   Specimen: BLOOD RIGHT WRIST  Result Value Ref Range   Specimen Description BLOOD RIGHT WRIST    Special Requests      BOTTLES DRAWN AEROBIC AND ANAEROBIC Blood Culture adequate volume   Culture      NO GROWTH 2 DAYS Performed at [facility] [address] [place], [place] [address]    Report Status PENDING   Lactic acid, plasma     Status: None   Collection Time: 04/17/21 10:08 PM  Result Value Ref Range   Lactic Acid, Venous 1.4 0.5 - 1.9 mmol/L    Comment: Performed at [facility] [address] [place], [place] [address]  Comprehensive metabolic panel     Status: Abnormal   Collection Time: 04/17/21 10:08 PM  Result Value Ref Range   Sodium 139 135 - 145 mmol/L   Potassium 3.5 3.5 - 5.1 mmol/L   Chloride 103 98 - 111 mmol/L   CO2 26 22 - 32 mmol/L   Glucose, Bld 106 (H) 70 - 99 mg/dL    Comment: Glucose reference  range applies only to samples taken after fasting for at least 8 hours.   BUN 27 (H) 8 - 23 mg/dL   Creatinine, Ser 1.06 (H) 0.44 - 1.00 mg/dL   Calcium 9.1 8.9 - 10.3 mg/dL   Total Protein 7.7 6.5 - 8.1 g/dL   Albumin 2.7 (L) 3.5 - 5.0 g/dL   AST 40 15 - 41 U/L   ALT 22 0 - 44 U/L   Alkaline Phosphatase 60 38 - 126 U/L   Total Bilirubin 1.3 (H) 0.3 - 1.2 mg/dL   GFR, Estimated 52 (L) >60 mL/min    Comment: (NOTE) Calculated using the CKD-EPI Creatinine Equation (2021)    Anion gap 10 5 - 15    Comment: Performed at [facility] [address], [place], [place] [address]  CBC WITH DIFFERENTIAL     Status: Abnormal   Collection Time: 04/17/21 10:08 PM  Result Value Ref Range   WBC  6.7 4.0 - 10.5 K/uL   RBC 3.83 (L) 3.87 - 5.11 MIL/uL   Hemoglobin 12.0 12.0 - 15.0 g/dL   HCT 37.8 36.0 - 46.0 %   MCV 98.7 80.0 - 100.0 fL   MCH 31.3 26.0 - 34.0 pg   MCHC 31.7 30.0 - 36.0 g/dL   RDW 13.3 11.5 - 15.5 %   Platelets 186 150 - 400 K/uL   nRBC 0.0 0.0 - 0.2 %   Neutrophils Relative % 85 %   Neutro Abs 5.7 1.7 - 7.7 K/uL   Lymphocytes Relative 6 %   Lymphs Abs 0.4 (L) 0.7 - 4.0 K/uL   Monocytes Relative 7 %   Monocytes Absolute 0.4 0.1 - 1.0 K/uL   Eosinophils Relative 2 %   Eosinophils Absolute 0.1 0.0 - 0.5 K/uL   Basophils Relative 0 %   Basophils Absolute 0.0 0.0 - 0.1 K/uL   Immature Granulocytes 0 %   Abs Immature Granulocytes 0.02 0.00 - 0.07 K/uL    Comment: Performed at [facility] [address], [place], [place] [address]  Protime-INR     Status: None   Collection Time: 04/17/21 10:08 PM  Result Value Ref Range   Prothrombin Time 13.4 11.4 - 15.2 seconds   INR 1.0 0.8 - 1.2    Comment: (NOTE) INR goal varies based on device and disease states. Performed at [facility], [place] [address] [place], [place] [address]   APTT     Status: None   Collection Time: 04/17/21 10:08 PM  Result Value Ref Range   aPTT 28 24 - 36 seconds    Comment: Performed at [facility] [address], [place], [place] [address]  Blood Culture (routine x 2)     Status: Abnormal (Preliminary result)   Collection Time: 04/17/21 10:08 PM   Specimen: BLOOD  Result Value Ref Range   Specimen Description BLOOD LEFT ANTECUBITAL    Special Requests      BOTTLES DRAWN AEROBIC AND ANAEROBIC Blood Culture results may not be optimal due to an inadequate volume of blood received in culture bottles   Culture  Setup Time      [place] CRITICAL RESULT CALLED TO, READ BACK BY AND VERIFIED WITH: PHARMD LISA CURRAN 04/18/21 @2050  BY JW    Culture (A)     STAPHYLOCOCCUS CAPITIS THE SIGNIFICANCE OF ISOLATING THIS ORGANISM  FROM A SINGLE SET OF BLOOD CULTURES WHEN MULTIPLE SETS ARE DRAWN IS UNCERTAIN.  PLEASE NOTIFY THE MICROBIOLOGY DEPARTMENT WITHIN ONE WEEK IF SPECIATION AND SENSITIVITIES ARE REQUIRED. Performed at [facility], [place] [address], [place], [place] [address]    Report Status PENDING   CK     Status: Abnormal   Collection Time: 04/17/21 10:08 PM  Result Value Ref Range   Total CK 351 (H) 38 - 234 U/L    Comment: Performed at [facility], [place] [address] [place], [place] [address]  Blood Culture ID Panel (Reflexed)     Status: Abnormal   Collection Time: 04/17/21 10:08 PM  Result Value Ref Range   Enterococcus faecalis NOT DETECTED NOT DETECTED   Enterococcus Faecium NOT DETECTED NOT DETECTED   Listeria monocytogenes NOT DETECTED NOT DETECTED   Staphylococcus species DETECTED (A) NOT DETECTED    Comment: CRITICAL RESULT CALLED TO, READ BACK BY AND VERIFIED WITH: PHARMD LISA CURRAN 04/18/21 @2050  BY JW    Staphylococcus aureus (BCID) NOT DETECTED NOT DETECTED   Staphylococcus epidermidis NOT DETECTED NOT DETECTED   Staphylococcus lugdunensis NOT DETECTED NOT DETECTED   Streptococcus species NOT DETECTED NOT DETECTED   Streptococcus agalactiae NOT DETECTED NOT DETECTED   Streptococcus pneumoniae NOT DETECTED NOT DETECTED   Streptococcus pyogenes NOT DETECTED NOT DETECTED   A.calcoaceticus-baumannii NOT DETECTED NOT DETECTED   Bacteroides fragilis NOT DETECTED NOT DETECTED   Enterobacterales NOT DETECTED NOT DETECTED   Enterobacter cloacae complex NOT DETECTED NOT DETECTED   Escherichia coli NOT DETECTED NOT DETECTED   Klebsiella aerogenes NOT DETECTED NOT DETECTED   Klebsiella oxytoca NOT DETECTED NOT DETECTED   Klebsiella pneumoniae NOT DETECTED NOT DETECTED   Proteus species NOT DETECTED NOT DETECTED   Salmonella species NOT DETECTED NOT DETECTED   Serratia marcescens NOT DETECTED NOT DETECTED   Haemophilus influenzae NOT DETECTED NOT DETECTED   Neisseria meningitidis NOT  DETECTED NOT DETECTED   Pseudomonas aeruginosa NOT DETECTED NOT DETECTED   Stenotrophomonas maltophilia NOT DETECTED NOT DETECTED   Candida albicans NOT DETECTED NOT DETECTED   Candida auris NOT DETECTED NOT DETECTED   Candida glabrata NOT DETECTED NOT DETECTED   Candida krusei NOT DETECTED NOT DETECTED   Candida parapsilosis NOT DETECTED NOT DETECTED   Candida tropicalis NOT DETECTED NOT DETECTED   Cryptococcus neoformans/gattii NOT DETECTED NOT DETECTED    Comment: Performed at [facility], [address], [place], [place] [address]  Resp Panel by RT-PCR (Flu A&B, Covid) Nasopharyngeal Swab     Status: None   Collection Time: 04/17/21 10:10 PM   Specimen: Nasopharyngeal Swab; Nasopharyngeal(NP) swabs in vial transport medium  Result Value Ref Range   SARS Coronavirus 2 by RT PCR NEGATIVE NEGATIVE    Comment: (NOTE) SARS-CoV-2 target nucleic acids are NOT DETECTED.  The SARS-CoV-2 RNA is generally detectable in upper respiratory specimens during the acute phase of infection. The lowest concentration of SARS-CoV-2 viral copies this assay can detect is 138 copies/mL. A negative result does not preclude SARS-Cov-2 infection and should not be used as the sole basis for treatment or other patient management decisions. A negative result may occur with  improper specimen collection/handling, submission of specimen other than nasopharyngeal swab, presence of viral mutation(s) within the areas targeted by this assay, and inadequate number of viral copies(<138 copies/mL). A negative result must be combined with clinical observations, patient history, and epidemiological information. The expected result is Negative.  Fact Sheet for Patients:  EntrepreneurPulse.com.au  Fact Sheet for Healthcare Providers:  IncredibleEmployment.be  This test is no t  yet approved or cleared by the [place] and  has been authorized for detection and/or  diagnosis of SARS-CoV-2 by FDA under an Emergency Use Authorization (EUA). This EUA will remain  in effect (meaning this test can be used) for the duration of the COVID-19 declaration under Section 564(b)(1) of the Act, 21 U.S.C.section 360bbb-3(b)(1), unless the authorization is terminated  or revoked sooner.       Influenza A by PCR NEGATIVE NEGATIVE   Influenza B by PCR NEGATIVE NEGATIVE    Comment: (NOTE) The Xpert Xpress SARS-CoV-2/FLU/RSV plus assay is intended as an aid in the diagnosis of influenza from Nasopharyngeal swab specimens and should not be used as a sole basis for treatment. Nasal washings and aspirates are unacceptable for Xpert Xpress SARS-CoV-2/FLU/RSV testing.  Fact Sheet for Patients: EntrepreneurPulse.com.au  Fact Sheet for Healthcare Providers: IncredibleEmployment.be  This test is not yet approved or cleared by the [place] FDA and has been authorized for detection and/or diagnosis of SARS-CoV-2 by FDA under an Emergency Use Authorization (EUA). This EUA will remain in effect (meaning this test can be used) for the duration of the COVID-19 declaration under Section 564(b)(1) of the Act, 21 U.S.C. section 360bbb-3(b)(1), unless the authorization is terminated or revoked.  Performed at [facility], [place] [address] [place], [place] [address]   CBG monitoring, ED     Status: None   Collection Time: 04/17/21 11:21 PM  Result Value Ref Range   Glucose-Capillary 87 70 - 99 mg/dL    Comment: Glucose reference range applies only to samples taken after fasting for at least 8 hours.  Hemoglobin A1c     Status: None   Collection Time: 04/18/21  7:07 AM  Result Value Ref Range   Hgb A1c MFr Bld 5.6 4.8 - 5.6 %    Comment: (NOTE) Pre diabetes:          5.7%-6.4%  Diabetes:              >6.4%  Glycemic control for   <7.0% adults with diabetes    Mean Plasma Glucose 114.02 mg/dL    Comment: Performed at  [facility] [address] [place], [place] [address]  CBC with Differential/Platelet     Status: Abnormal   Collection Time: 04/18/21  7:08 AM  Result Value Ref Range   WBC 6.6 4.0 - 10.5 K/uL   RBC 3.44 (L) 3.87 - 5.11 MIL/uL   Hemoglobin 10.8 (L) 12.0 - 15.0 g/dL   HCT 34.2 (L) 36.0 - 46.0 %   MCV 99.4 80.0 - 100.0 fL   MCH 31.4 26.0 - 34.0 pg   MCHC 31.6 30.0 - 36.0 g/dL   RDW 13.5 11.5 - 15.5 %   Platelets 177 150 - 400 K/uL   nRBC 0.0 0.0 - 0.2 %   Neutrophils Relative % 85 %   Neutro Abs 5.6 1.7 - 7.7 K/uL   Lymphocytes Relative 6 %   Lymphs Abs 0.4 (L) 0.7 - 4.0 K/uL   Monocytes Relative 8 %   Monocytes Absolute 0.5 0.1 - 1.0 K/uL   Eosinophils Relative 1 %   Eosinophils Absolute 0.0 0.0 - 0.5 K/uL   Basophils Relative 0 %   Basophils Absolute 0.0 0.0 - 0.1 K/uL   Immature Granulocytes 0 %   Abs Immature Granulocytes 0.02 0.00 - 0.07 K/uL    Comment: Performed at [facility], [place] [address], [place], [place] [address]  CK  Status: None   Collection Time: 04/18/21  7:08 AM  Result Value Ref Range   Total CK 232 38 - 234 U/L    Comment: Performed at [facility], [place] [address], [place], [place] [address]  CBG monitoring, ED     Status: Abnormal   Collection Time: 04/18/21  7:56 AM  Result Value Ref Range   Glucose-Capillary 62 (L) 70 - 99 mg/dL    Comment: Glucose reference range applies only to samples taken after fasting for at least 8 hours.  Renal function panel     Status: Abnormal   Collection Time: 04/18/21  8:17 AM  Result Value Ref Range   Sodium 149 (H) 135 - 145 mmol/L   Potassium 2.3 (LL) 3.5 - 5.1 mmol/L    Comment: CRITICAL RESULT CALLED TO, READ BACK BY AND VERIFIED WITH: LOZER,C RN @ [phone] 04/18/21 LEONARD,A    Chloride >130 (HH) 98 - 111 mmol/L    Comment: CRITICAL RESULT CALLED TO, READ BACK BY AND VERIFIED WITH: LOZER,C RN @ [phone] 04/18/21 LEONARD,A    CO2 12 (L) 22 - 32 mmol/L   Glucose, Bld 38 (LL) 70 - 99 mg/dL     Comment: Glucose reference range applies only to samples taken after fasting for at least 8 hours. CRITICAL RESULT CALLED TO, READ BACK BY AND VERIFIED WITH: LOZER,C RN @ [phone] 04/18/21 LEONARD,A    BUN 16 8 - 23 mg/dL   Creatinine, Ser 0.48 0.44 - 1.00 mg/dL   Calcium 4.0 (LL) 8.9 - 10.3 mg/dL    Comment: CRITICAL RESULT CALLED TO, READ BACK BY AND VERIFIED WITH: LOZER,C RN @ [phone] 04/18/21 LEONARD,A    Phosphorus 1.7 (L) 2.5 - 4.6 mg/dL   Albumin <1.5 (L) 3.5 - 5.0 g/dL   GFR, Estimated >60 >60 mL/min    Comment: (NOTE) Calculated using the CKD-EPI Creatinine Equation (2021)    Anion gap NOT CALCULATED 5 - 15    Comment: Performed at [facility], [place] [address], [place], [place] [address]  CK     Status: None   Collection Time: 04/18/21  8:17 AM  Result Value Ref Range   Total CK 125 38 - 234 U/L    Comment: Performed at [facility], [place] [address] [place], [place] [address]  Magnesium     Status: Abnormal   Collection Time: 04/18/21  8:17 AM  Result Value Ref Range   Magnesium 0.8 (LL) 1.7 - 2.4 mg/dL    Comment: CRITICAL RESULT CALLED TO, READ BACK BY AND VERIFIED WITHSilvana Newness RN @ [phone] 04/18/21 LEONARD,A Performed at [facility], [place] [address], [place], [place] [address]   CBG monitoring, ED     Status: None   Collection Time: 04/18/21  8:43 AM  Result Value Ref Range   Glucose-Capillary 81 70 - 99 mg/dL    Comment: Glucose reference range applies only to samples taken after fasting for at least 8 hours.  Renal function panel     Status: Abnormal   Collection Time: 04/18/21 10:11 AM  Result Value Ref Range   Sodium 141 135 - 145 mmol/L    Comment: DELTA CHECK NOTED   Potassium 3.8 3.5 - 5.1 mmol/L    Comment: DELTA CHECK NOTED NO VISIBLE HEMOLYSIS    Chloride 107 98 - 111 mmol/L   CO2 24 22 - 32 mmol/L   Glucose, Bld 101 (H) 70 - 99 mg/dL    Comment: Glucose reference  range applies only to samples taken after fasting for at least 8 hours.   BUN  29 (H) 8 - 23 mg/dL   Creatinine, Ser 1.14 (H) 0.44 - 1.00 mg/dL   Calcium 8.5 (L) 8.9 - 10.3 mg/dL    Comment: DELTA CHECK NOTED   Phosphorus 3.5 2.5 - 4.6 mg/dL   Albumin 2.4 (L) 3.5 - 5.0 g/dL   GFR, Estimated 47 (L) >60 mL/min    Comment: (NOTE) Calculated using the CKD-EPI Creatinine Equation (2021)    Anion gap 10 5 - 15    Comment: Performed at [facility] [address], [place], [place] [address]  Magnesium     Status: None   Collection Time: 04/18/21 10:11 AM  Result Value Ref Range   Magnesium 1.8 1.7 - 2.4 mg/dL    Comment: Performed at [facility] [address], [place], [place] [address]  CBG monitoring, ED     Status: Abnormal   Collection Time: 04/18/21 10:49 AM  Result Value Ref Range   Glucose-Capillary 101 (H) 70 - 99 mg/dL    Comment: Glucose reference range applies only to samples taken after fasting for at least 8 hours.  Urine Culture     Status: Abnormal   Collection Time: 04/18/21  1:12 PM   Specimen: In/Out Cath Urine  Result Value Ref Range   Specimen Description IN/OUT CATH URINE    Special Requests      NONE Performed at [facility], [address] [place], [place] [address]    Culture MULTIPLE SPECIES PRESENT, SUGGEST RECOLLECTION (A)    Report Status 04/19/2021 FINAL   Urinalysis, Routine w reflex microscopic Urine, Catheterized     Status: Abnormal   Collection Time: 04/18/21  1:42 PM  Result Value Ref Range   Color, Urine YELLOW YELLOW   APPearance HAZY (A) CLEAR   Specific Gravity, Urine 1.024 1.005 - 1.030   pH 5.0 5.0 - 8.0   Glucose, UA NEGATIVE NEGATIVE mg/dL   Hgb urine dipstick NEGATIVE NEGATIVE   Bilirubin Urine NEGATIVE NEGATIVE   Ketones, ur 5 (A) NEGATIVE mg/dL   Protein, ur 100 (A) NEGATIVE mg/dL   Nitrite NEGATIVE NEGATIVE   Leukocytes,Ua NEGATIVE NEGATIVE   RBC / HPF 0-5 0 - 5 RBC/hpf   WBC, UA 0-5 0 - 5 WBC/hpf   Bacteria, UA NONE SEEN NONE SEEN   Squamous Epithelial / LPF 0-5 0 - 5    Comment:  Performed at [facility], [place] [address], [place], [place] [address]  TSH     Status: None   Collection Time: 04/18/21  2:12 PM  Result Value Ref Range   TSH 1.887 0.350 - 4.500 uIU/mL    Comment: Performed by a 3rd Generation assay with a functional sensitivity of <=0.01 uIU/mL. Performed at [facility], [place] [address] [place], [place] [address]   Vitamin B12     Status: Abnormal   Collection Time: 04/18/21  2:12 PM  Result Value Ref Range   Vitamin B-12 1,358 (H) 180 - 914 pg/mL    Comment: (NOTE) This assay is not validated for testing neonatal or myeloproliferative syndrome specimens for Vitamin B12 levels. Performed at [facility], [place] [address], [place], [name] [address]   RPR     Status: None   Collection Time: 04/18/21  2:12 PM  Result Value Ref Range   RPR Ser Ql NON REACTIVE NON REACTIVE  Comment: Performed at [facility], [place] [address], [place], [place] [address]  Ammonia     Status: None   Collection Time: 04/18/21  2:35 PM  Result Value Ref Range   Ammonia 22 9 - 35 umol/L    Comment: Performed at [facility], [place] [address] [place], [place] [address]  Rapid urine drug screen (hospital performed)     Status: None   Collection Time: 04/18/21  4:21 PM  Result Value Ref Range   Opiates NONE DETECTED NONE DETECTED   Cocaine NONE DETECTED NONE DETECTED   Benzodiazepines NONE DETECTED NONE DETECTED   Amphetamines NONE DETECTED NONE DETECTED   Tetrahydrocannabinol NONE DETECTED NONE DETECTED   Barbiturates NONE DETECTED NONE DETECTED    Comment: (NOTE) DRUG SCREEN FOR MEDICAL PURPOSES ONLY.  IF CONFIRMATION IS NEEDED FOR ANY PURPOSE, NOTIFY LAB WITHIN 5 DAYS.  LOWEST DETECTABLE LIMITS FOR URINE DRUG SCREEN Drug Class                     Cutoff (ng/mL) Amphetamine and metabolites    1000 Barbiturate and metabolites    200 Benzodiazepine                 098 Tricyclics and metabolites     300 Opiates and metabolites         300 Cocaine and metabolites        300 THC                            50 Performed at [facility], [place] [address], [place],  [address]   CBG monitoring, ED     Status: None   Collection Time: 04/18/21  4:44 PM  Result Value Ref Range   Glucose-Capillary 96 70 - 99 mg/dL    Comment: Glucose reference range applies only to samples taken after fasting for at least 8 hours.  CBG monitoring, ED     Status: None   Collection Time: 04/18/21  4:52 PM  Result Value Ref Range   Glucose-Capillary 85 70 - 99 mg/dL    Comment: Glucose reference range applies only to samples taken after fasting for at least 8 hours.  CBG monitoring, ED     Status: Abnormal   Collection Time: 04/18/21 11:57 PM  Result Value Ref Range   Glucose-Capillary 138 (H) 70 - 99 mg/dL    Comment: Glucose reference range applies only to samples taken after fasting for at least 8 hours.  CBG monitoring, ED     Status: None   Collection Time: 04/19/21  8:17 AM  Result Value Ref Range   Glucose-Capillary 71 70 - 99 mg/dL    Comment: Glucose reference range applies only to samples taken after fasting for at least 8 hours.  CBG monitoring, ED     Status: Abnormal   Collection Time: 04/19/21 12:10 PM  Result Value Ref Range   Glucose-Capillary 112 (H) 70 - 99 mg/dL    Comment: Glucose reference range applies only to samples taken after fasting for at least 8 hours.   DG Ribs Unilateral W/Chest Right  Result Date: 04/17/2021 CLINICAL DATA:  Unwitnessed fall. EXAM: RIGHT RIBS AND CHEST - 3+ VIEW COMPARISON:  None. FINDINGS: No fracture or other bone lesions are seen involving the ribs. Degenerative changes are seen throughout the thoracic spine. There is no evidence of pneumothorax or pleural effusion. Both lungs are clear.  Heart size and mediastinal contours are within normal limits. IMPRESSION: No acute osseous abnormality. Electronically Signed   By: Virgina Norfolk M.D.   On: 04/17/2021 23:30   DG Shoulder  Right  Result Date: 04/17/2021 CLINICAL DATA:  Unwitnessed fall. EXAM: RIGHT SHOULDER - 2+ VIEW COMPARISON:  None. FINDINGS: There is no evidence of an acute fracture or dislocation. Chronic deformities are seen involving the inferolateral aspect of the right humeral head. A chronic deformity of the greater tubercle is also seen seen. Moderate to marked severity degenerative changes are seen involving the right acromioclavicular joint and right glenohumeral articulation. Soft tissues are unremarkable. IMPRESSION: 1. Degenerative changes without evidence of an acute fracture or dislocation. 2. Chronic deformities of the right humeral head and right greater tubercle. Electronically Signed   By: Virgina Norfolk M.D.   On: 04/17/2021 23:31   CT Head Wo Contrast  Result Date: 04/17/2021 CLINICAL DATA:  Polytrauma. EXAM: CT HEAD WITHOUT CONTRAST CT CERVICAL SPINE WITHOUT CONTRAST TECHNIQUE: Multidetector CT imaging of the head and cervical spine was performed following the standard protocol without intravenous contrast. Multiplanar CT image reconstructions of the cervical spine were also generated. COMPARISON:  None. FINDINGS: CT HEAD FINDINGS Brain: No evidence of acute infarction, hemorrhage, hydrocephalus, extra-axial collection or mass lesion/mass effect. There is mild periventricular white matter hypodensity, likely chronic small vessel ischemic change. There is mild diffuse atrophy. Vascular: Atherosclerotic calcifications are present within the cavernous internal carotid arteries. Skull: Normal. Negative for fracture or focal lesion. Sinuses/Orbits: No acute finding. Other: There is right facial and right frontal soft tissue swelling. There are punctate skin densities, likely calcifications of the face bilaterally. CT CERVICAL SPINE FINDINGS Alignment: Normal. Skull base and vertebrae: No acute fracture. No primary bone lesion or focal pathologic process. Soft tissues and spinal canal: No prevertebral fluid  or swelling. No visible canal hematoma. Disc levels: There is disc space narrowing and endplate osteophyte formation at C5-C6 and C7-T1 compatible with degenerative change. There is scattered mild neural foraminal stenosis bilaterally. Disc bulge at C7-T1 causes moderate central canal stenosis. Upper chest: There is a 3 mm nodule in the left upper lobe image 5/90. Other: There is a heavily calcified left thyroid nodule measuring 9 mm. There is a 5 mm hypodense left thyroid nodule. IMPRESSION: 1.  No acute intracranial process. 2. No acute fracture or traumatic subluxation of the cervical spine. 3. 3 mm nodule in the left upper lobe. If patient is at low risk for bronchus carcinoma no follow-up is necessary. The patient is at high risk a follow-up CT is recommended in 12 months to reassess. Electronically Signed   By: Ronney Asters M.D.   On: 04/17/2021 22:52   CT Cervical Spine Wo Contrast  Result Date: 04/17/2021 CLINICAL DATA:  Polytrauma. EXAM: CT HEAD WITHOUT CONTRAST CT CERVICAL SPINE WITHOUT CONTRAST TECHNIQUE: Multidetector CT imaging of the head and cervical spine was performed following the standard protocol without intravenous contrast. Multiplanar CT image reconstructions of the cervical spine were also generated. COMPARISON:  None. FINDINGS: CT HEAD FINDINGS Brain: No evidence of acute infarction, hemorrhage, hydrocephalus, extra-axial collection or mass lesion/mass effect. There is mild periventricular white matter hypodensity, likely chronic small vessel ischemic change. There is mild diffuse atrophy. Vascular: Atherosclerotic calcifications are present within the cavernous internal carotid arteries. Skull: Normal. Negative for fracture or focal lesion. Sinuses/Orbits: No acute finding. Other: There is right facial and right frontal soft tissue swelling. There are punctate skin densities, likely calcifications of the face  bilaterally. CT CERVICAL SPINE FINDINGS Alignment: Normal. Skull base and  vertebrae: No acute fracture. No primary bone lesion or focal pathologic process. Soft tissues and spinal canal: No prevertebral fluid or swelling. No visible canal hematoma. Disc levels: There is disc space narrowing and endplate osteophyte formation at C5-C6 and C7-T1 compatible with degenerative change. There is scattered mild neural foraminal stenosis bilaterally. Disc bulge at C7-T1 causes moderate central canal stenosis. Upper chest: There is a 3 mm nodule in the left upper lobe image 5/90. Other: There is a heavily calcified left thyroid nodule measuring 9 mm. There is a 5 mm hypodense left thyroid nodule. IMPRESSION: 1.  No acute intracranial process. 2. No acute fracture or traumatic subluxation of the cervical spine. 3. 3 mm nodule in the left upper lobe. If patient is at low risk for bronchus carcinoma no follow-up is necessary. The patient is at high risk a follow-up CT is recommended in 12 months to reassess. Electronically Signed   By: Ronney Asters M.D.   On: 04/17/2021 22:52   DG Pelvis Portable  Result Date: 04/17/2021 CLINICAL DATA:  Unwitnessed fall. EXAM: PORTABLE PELVIS 1-2 VIEWS COMPARISON:  None. FINDINGS: There is no evidence of an acute pelvic fracture or diastasis. Marked severity degenerative changes are seen involving both hips, in the form of joint space narrowing and acetabular sclerosis. A large amount of stool is seen throughout the colon. IMPRESSION: 1. No acute fracture or diastasis. 2. Marked severity degenerative changes of both hips. Electronically Signed   By: Virgina Norfolk M.D.   On: 04/17/2021 23:25   DG Knee Complete 4 Views Left  Result Date: 04/17/2021 CLINICAL DATA:  Unwitnessed fall. EXAM: LEFT KNEE - COMPLETE 4+ VIEW COMPARISON:  None. FINDINGS: No evidence of an acute fracture or dislocation. Marked severity narrowing of the medial and lateral tibiofemoral compartment spaces is seen. Marked severity patellofemoral narrowing is also noted. Marked severity  vascular calcification is seen. There is a small joint effusion. IMPRESSION: 1. No evidence of acute fracture or dislocation. 2. Severe tricompartmental degenerative changes. 3. Small joint effusion. Electronically Signed   By: Virgina Norfolk M.D.   On: 04/17/2021 23:28   DG Knee Complete 4 Views Right  Result Date: 04/17/2021 CLINICAL DATA:  Unwitnessed fall. EXAM: RIGHT KNEE - COMPLETE 4+ VIEW COMPARISON:  None. FINDINGS: No evidence of an acute fracture. Mild chronic appearing lateral subluxation of the right tibia is noted with respect to the distal right femur. Marked severity medial and lateral tibiofemoral compartment space narrowing is seen. Marked severity patellofemoral narrowing is also noted. There is moderate to marked severity vascular calcification. A very small joint effusion is suspected. IMPRESSION: 1. No evidence of acute osseous abnormality. 2. Severe tricompartmental degenerative changes. 3. Very small joint effusion. Electronically Signed   By: Virgina Norfolk M.D.   On: 04/17/2021 23:29   DG Humerus Right  Result Date: 04/17/2021 CLINICAL DATA:  Unwitnessed fall. EXAM: RIGHT HUMERUS - 2+ VIEW COMPARISON:  None. FINDINGS: There is no evidence of acute fracture. Mild superior subluxation of the right humeral head is seen with respect to the right glenoid. Mild to moderate severity degenerative changes are also seen involving the right shoulder and right acromioclavicular joint. Soft tissues are unremarkable. IMPRESSION: 1. No acute fracture. 2. Mild superior subluxation of the right humeral head which may be chronic in nature. 3. Mild to moderate severity degenerative changes. Electronically Signed   By: Virgina Norfolk M.D.   On: 04/17/2021 23:27  Pending Labs Unresulted Labs (From admission, onward)     Start     Ordered   04/18/21 0500  CBC with Differential/Platelet  Tomorrow morning,   R        04/18/21 0450   04/17/21 2138  Urinalysis, Routine w reflex microscopic  Urine, Clean Catch  (Septic presentation on arrival (screening labs, nursing and treatment orders for obvious sepsis))  ONCE - STAT,   STAT        04/17/21 2140            Vitals/Pain Today's Vitals   04/19/21 0950 04/19/21 1000 04/19/21 1100 04/19/21 1332  BP: (!) 173/89 (!) 179/90 (!) 170/90 (!) 167/117  Pulse:  64 (!) 59   Resp:  18 18   Temp:      TempSrc:      SpO2:  99% 98%   Weight:      Height:      PainSc:        Isolation Precautions No active isolations  Medications Medications  aspirin chewable tablet 81 mg (81 mg Oral Given 04/19/21 0940)  diltiazem (CARDIZEM CD) 24 hr capsule 240 mg (240 mg Oral Given 04/19/21 0950)  heparin injection 5,000 Units (5,000 Units Subcutaneous Given 04/19/21 1332)  acetaminophen (TYLENOL) tablet 650 mg (has no administration in time range)    Or  acetaminophen (TYLENOL) suppository 650 mg (has no administration in time range)  ondansetron (ZOFRAN) tablet 4 mg (has no administration in time range)    Or  ondansetron (ZOFRAN) injection 4 mg (has no administration in time range)  senna (SENOKOT) tablet 8.6 mg (8.6 mg Oral Given 04/19/21 0941)  lactated ringers infusion ( Intravenous Rate/Dose Verify 04/19/21 0402)  hydrALAZINE (APRESOLINE) tablet 25 mg (has no administration in time range)  insulin aspart (novoLOG) injection 0-6 Units (0 Units Subcutaneous Not Given 04/19/21 1211)  cefTRIAXone (ROCEPHIN) 1 g in sodium chloride 0.9 % 100 mL IVPB (1 g Intravenous New Bag/Given 04/19/21 1331)  multivitamin with minerals tablet 1 tablet (1 tablet Oral Given 04/19/21 0941)  hydrALAZINE (APRESOLINE) tablet 25 mg (25 mg Oral Given 04/19/21 1332)  hydrOXYzine (ATARAX/VISTARIL) tablet 10 mg (has no administration in time range)  cefTRIAXone (ROCEPHIN) 1 g in sodium chloride 0.9 % 100 mL IVPB (0 g Intravenous Stopped 04/17/21 2338)  sodium chloride 0.9 % bolus 1,000 mL (0 mLs Intravenous Stopped 04/17/21 2337)    Mobility walks with person  assist Moderate fall risk   Focused Assessments Neuro Assessment Handoff:  Swallow screen pass? Yes          Neuro Assessment: Exceptions to WDL Neuro Checks:      Last Documented NIHSS Modified Score:   Has TPA been given? No If patient is a Neuro Trauma and patient is going to OR before floor call report to [place] nurse: [phone] or [phone]   R Recommendations: See Admitting Provider Note  Report given to:   Additional Notes:

## 2021-04-19 NOTE — ED Notes (Signed)
Pt has ripped out IV at this time

## 2021-04-19 NOTE — ED Notes (Signed)
Placed Breakfast orders 

## 2021-04-20 ENCOUNTER — Inpatient Hospital Stay (HOSPITAL_COMMUNITY): Payer: Medicare Other

## 2021-04-20 DIAGNOSIS — T68XXXA Hypothermia, initial encounter: Secondary | ICD-10-CM | POA: Diagnosis not present

## 2021-04-20 DIAGNOSIS — N1831 Chronic kidney disease, stage 3a: Secondary | ICD-10-CM | POA: Diagnosis not present

## 2021-04-20 DIAGNOSIS — I739 Peripheral vascular disease, unspecified: Secondary | ICD-10-CM

## 2021-04-20 DIAGNOSIS — L89210 Pressure ulcer of right hip, unstageable: Secondary | ICD-10-CM | POA: Diagnosis not present

## 2021-04-20 DIAGNOSIS — G9341 Metabolic encephalopathy: Secondary | ICD-10-CM | POA: Diagnosis not present

## 2021-04-20 LAB — BASIC METABOLIC PANEL
Anion gap: 6 (ref 5–15)
BUN: 27 mg/dL — ABNORMAL HIGH (ref 8–23)
CO2: 26 mmol/L (ref 22–32)
Calcium: 8.3 mg/dL — ABNORMAL LOW (ref 8.9–10.3)
Chloride: 105 mmol/L (ref 98–111)
Creatinine, Ser: 1.13 mg/dL — ABNORMAL HIGH (ref 0.44–1.00)
GFR, Estimated: 48 mL/min — ABNORMAL LOW (ref >60)
Glucose, Bld: 110 mg/dL — ABNORMAL HIGH (ref 70–99)
Potassium: 3.9 mmol/L (ref 3.5–5.1)
Sodium: 137 mmol/L (ref 135–145)

## 2021-04-20 LAB — CULTURE, BLOOD (ROUTINE X 2)

## 2021-04-20 LAB — GLUCOSE, CAPILLARY
Glucose-Capillary: 74 mg/dL (ref 70–99)
Glucose-Capillary: 150 mg/dL — ABNORMAL HIGH (ref 70–99)
Glucose-Capillary: 145 mg/dL — ABNORMAL HIGH (ref 70–99)

## 2021-04-20 MED ORDER — ENSURE ENLIVE PO LIQD
237.0000 mL | Freq: Two times a day (BID) | ORAL | Status: DC
  Administered 2021-04-21: 237 mL via ORAL

## 2021-04-20 NOTE — NC FL2 (Signed)
[place] MEDICAID FL2 LEVEL OF CARE SCREENING TOOL     IDENTIFICATION  Patient Name: Penny [name] Birthdate: 11-09-1934 Sex: female Admission Date (Current Location): 04/17/2021  [facility] and [place] Number:  Herbalist and Address:  The [place], [place] [address], [place], [place] [address]      Provider Number: [id number]  Attending Physician Name and Address:  Hosie Poisson, MD  Relative Name and Phone Number:  [name] (Niece)   [phone]    Current Level of Care: Hospital Recommended Level of Care: [place] Prior Approval Number:    Date Approved/Denied:   PASRR Number: [id number]  Discharge Plan: SNF    Current Diagnoses: Patient Active Problem List   Diagnosis Date Noted   Acute metabolic encephalopathy 86/38/1771   Hypothermia due to exposure 04/17/2021   Pressure ulcer, shoulder blades, stage II ([place]) - right posterior shoulder 04/17/2021   Pressure ulcer, hip, right, unstageable ([place]) 04/17/2021   CKD (chronic kidney disease) stage 3, GFR 30-59 ml/min ([place]) 04/17/2021   Pain due to onychomycosis of toenails of both feet 02/01/2021   Type 2 diabetes mellitus with vascular disease ([place]) 02/01/2021    Orientation RESPIRATION BLADDER Height & Weight     Self, Time, Situation, Place  Normal Incontinent, External catheter Weight: 128 lb 15.5 oz (58.5 kg) Height:  5\' 5"  (165.1 cm)  BEHAVIORAL SYMPTOMS/MOOD NEUROLOGICAL BOWEL NUTRITION STATUS      Continent Diet (See DC Summary)  AMBULATORY STATUS COMMUNICATION OF NEEDS Skin   Extensive Assist Verbally Bruising (L Knee)                       Personal Care Assistance Level of Assistance  Bathing, Feeding, Dressing Bathing Assistance: Maximum assistance Feeding assistance: Independent Dressing Assistance: Maximum assistance     Functional Limitations Info  Sight, Hearing, Speech Sight Info: Impaired Hearing Info: Adequate Speech Info:  Adequate    SPECIAL CARE FACTORS FREQUENCY  PT (By licensed PT), OT (By licensed OT)     PT Frequency: 5x a week OT Frequency: 5x a week            Contractures Contractures Info: Not present    Additional Factors Info  Allergies, Code Status Code Status Info: Full Allergies Info: Glipizide           Current Medications (04/20/2021):  This is the current hospital active medication list Current Facility-Administered Medications  Medication Dose Route Frequency Provider Last Rate Last Admin   acetaminophen (TYLENOL) tablet 650 mg  650 mg Oral Q6H PRN Kristopher Oppenheim, DO       Or   acetaminophen (TYLENOL) suppository 650 mg  650 mg Rectal Q6H PRN Kristopher Oppenheim, DO       aspirin chewable tablet 81 mg  81 mg Oral Daily Kristopher Oppenheim, DO   81 mg at 04/20/21 0824   cefTRIAXone (ROCEPHIN) 1 g in sodium chloride 0.9 % 100 mL IVPB  1 g Intravenous Q24H Wendee Beavers T, MD 150 mL/hr at 04/20/21 1507 1 g at 04/20/21 1507   diltiazem (CARDIZEM CD) 24 hr capsule 240 mg  240 mg Oral Daily Kristopher Oppenheim, DO   240 mg at 04/20/21 0824   [START ON 04/21/2021] feeding supplement (ENSURE ENLIVE / ENSURE PLUS) liquid 237 mL  237 mL Oral BID BM Hosie Poisson, MD       heparin injection 5,000 Units  5,000 Units Subcutaneous Q8H Kristopher Oppenheim, DO   5,000  Units at 04/19/21 1332   hydrALAZINE (APRESOLINE) tablet 25 mg  25 mg Oral Q6H PRN Mercy Riding, MD       hydrALAZINE (APRESOLINE) tablet 25 mg  25 mg Oral Q8H Gonfa, Taye T, MD   25 mg at 04/20/21 1421   hydrOXYzine (ATARAX/VISTARIL) tablet 10 mg  10 mg Oral TID PRN Hosie Poisson, MD   10 mg at 04/19/21 2313   insulin aspart (novoLOG) injection 0-6 Units  0-6 Units Subcutaneous TID WC Hosie Poisson, MD       LORazepam (ATIVAN) tablet 0.5 mg  0.5 mg Oral Q12H PRN Hosie Poisson, MD   0.5 mg at 04/19/21 2314   multivitamin with minerals tablet 1 tablet  1 tablet Oral Daily Wendee Beavers T, MD   1 tablet at 04/20/21 0824   ondansetron (ZOFRAN) tablet 4 mg  4 mg Oral Q6H PRN  Kristopher Oppenheim, DO       Or   ondansetron [facility]) injection 4 mg  4 mg Intravenous Q6H PRN Kristopher Oppenheim, DO       senna Donavan Burnet) tablet 8.6 mg  1 tablet Oral BID Kristopher Oppenheim, DO   8.6 mg at 04/20/21 6770     Discharge Medications: Please see discharge summary for a list of discharge medications.  Relevant Imaging Results:  Relevant Lab Results:   Additional Information SSN: [id number]  Reece Agar, [place]

## 2021-04-20 NOTE — Progress Notes (Signed)
Pt's niece says she needs to meet with Education officer, museum for [employer] for [employer] paperwork today.

## 2021-04-20 NOTE — Progress Notes (Signed)
PROGRESS NOTE    Penny [name]  ZWC:[id number] DOB: [birth year] DOA: 04/17/2021 PCP: Kelton Pillar, MD    Chief Complaint  Patient presents with   Fall    Brief Narrative:  86 year old F with PMH of DM-2, PAD, CKD-3 and possible cognitive impairment brought to ED after found down at home for unknown period of time and found to be hypothermic with core temp to 94.6 F and admitted for hypothermia and acute metabolic encephalopathy.  Last time seen well on 04/15/2021.  Has mildly elevated CK to 351.  CTh, CT cervical spine and multiple x-rays without significant finding. Assessment & Plan:   Principal Problem:   Hypothermia due to exposure Active Problems:   Type 2 diabetes mellitus with vascular disease (HCC)   Pressure ulcer, shoulder blades, stage II (HCC) - right posterior shoulder   Pressure ulcer, hip, right, unstageable (HCC)   CKD (chronic kidney disease) stage 3, GFR 30-59 ml/min (HCC)   Acute metabolic encephalopathy   Hypothermia: Resolved Acute metabolic encephalopathy with underlying cognitive impairment , currently low-grade temperatures probably secondary to UTI. Urine analysis abnormal, cultures growing multiple species.  Blood cultures showing staph capitis in the aerobic bottle possibly a contaminant, will repeat blood cultures .  Resume Rocephin for 2 more days to complete the course.  UDS is negative Vitamin B12 is elevated TSH within normal limits RPR is nonreactive Ammonia level within normal limits   Dehydration/mild rhabdomyolysis Improved. Liberalize the diet.     Mild anemia of chronic disease Continue to monitor.   Type 2 diabetes mellitus Last A1c is 5.6 Non-insulin-dependent CBG (last 3)  Recent Labs    04/19/21 1210 04/19/21 1716 04/20/21 1114  GLUCAP 112* 127* 74    Continue with sliding scale insulin.   Right posterior shoulder and right hip deep tissue pressure injury present on admission -  Right hip 9 x 8 and is  purple/maroon. Right shoulder is 5 x 1.5 pink and tan -  Foam dressing over both wounds placed by wound care -  Turn the patient every 2 hours.  Deconditioning and debility:  Therapy eval recommending SNF.  TOC consult in place.    DVT prophylaxis: Heparin Code Status: Full code  family Communication: None at bedside , discussed with niece over thep hone.  disposition:   Status is: Inpatient  Remains inpatient appropriate because:Unsafe d/c plan and IV treatments appropriate due to intensity of illness or inability to take PO  Dispo: The patient is from: Home              Anticipated d/c is to: SNF              Patient currently is not medically stable to d/c.   Difficult to place patient No       Consultants:  Palliative care for goals of care.   Procedures: None Antimicrobials: Rocephin for UTI   Subjective: No chest pain or sob. Alert and answering simple questions.   Objective: Vitals:   04/19/21 2005 04/20/21 0647 04/20/21 0843 04/20/21 1205  BP: (!) 158/78 136/78 (!) 170/82 (!) 161/80  Pulse: (!) 52 (!) 52 (!) 50 (!) 57  Resp: 16 20 18    Temp: (!) 97.4 F (36.3 C)     TempSrc: Oral     SpO2: 99% 97% 99% 97%  Weight:  58.5 kg    Height:        Intake/Output Summary (Last 24 hours) at 04/20/2021 1350 Last data filed at 04/19/2021 2315  Gross per 24 hour  Intake 60 ml  Output 250 ml  Net -190 ml    Filed Weights   04/19/21 0800 04/20/21 0647  Weight: 63.5 kg 58.5 kg    Examination:  General exam: Appears calm and comfortable  Respiratory system: Clear to auscultation. Respiratory effort normal. Cardiovascular system: S1 & S2 heard, RRR. No JVD,  No pedal edema. Gastrointestinal system: Abdomen is nondistended, soft and nontender. Normal bowel sounds heard. Central nervous system: Alert and oriented to person only.  Extremities: Symmetric 5 x 5 power. Skin: deep tissue injury over the back.  Psychiatry:  Mood & affect appropriate.      Data  Reviewed: I have personally reviewed following labs and imaging studies  CBC: Recent Labs  Lab 04/17/21 2208 04/18/21 0708 04/19/21 1647  WBC 6.7 6.6 5.2  NEUTROABS 5.7 5.6 3.7  HGB 12.0 10.8* 10.3*  HCT 37.8 34.2* 32.3*  MCV 98.7 99.4 98.8  PLT 186 177 153     Basic Metabolic Panel: Recent Labs  Lab 04/17/21 2208 04/18/21 0817 04/18/21 1011 04/19/21 1647 04/20/21 0240  NA 139 149* 141 138 137  K 3.5 2.3* 3.8 3.9 3.9  CL 103 >130* 107 103 105  CO2 26 12* 24 28 26   GLUCOSE 106* 38* 101* 138* 110*  BUN 27* 16 29* 25* 27*  CREATININE 1.06* 0.48 1.14* 1.02* 1.13*  CALCIUM 9.1 4.0* 8.5* 8.5* 8.3*  MG  --  0.8* 1.8  --   --   PHOS  --  1.7* 3.5  --   --      GFR: Estimated Creatinine Clearance: 33.3 mL/min (A) (by C-G formula based on SCr of 1.13 mg/dL (H)).  Liver Function Tests: Recent Labs  Lab 04/17/21 2208 04/18/21 0817 04/18/21 1011  AST 40  --   --   ALT 22  --   --   ALKPHOS 60  --   --   BILITOT 1.3*  --   --   PROT 7.7  --   --   ALBUMIN 2.7* <1.5* 2.4*     CBG: Recent Labs  Lab 04/18/21 2357 04/19/21 0817 04/19/21 1210 04/19/21 1716 04/20/21 1114  GLUCAP 138* 71 112* 127* 74      Recent Results (from the past 240 hour(s))  Blood Culture (routine x 2)     Status: None (Preliminary result)   Collection Time: 04/17/21 10:00 PM   Specimen: BLOOD RIGHT WRIST  Result Value Ref Range Status   Specimen Description BLOOD RIGHT WRIST  Final   Special Requests   Final    BOTTLES DRAWN AEROBIC AND ANAEROBIC Blood Culture adequate volume   Culture   Final    NO GROWTH 3 DAYS Performed at [facility], [place] [address], [place], [place] [address]    Report Status PENDING  Incomplete  Blood Culture (routine x 2)     Status: Abnormal   Collection Time: 04/17/21 10:08 PM   Specimen: BLOOD  Result Value Ref Range Status   Specimen Description BLOOD LEFT ANTECUBITAL  Final   Special Requests   Final    BOTTLES DRAWN AEROBIC AND ANAEROBIC  Blood Culture results may not be optimal due to an inadequate volume of blood received in culture bottles   Culture  Setup Time   Final    GRAM POSITIVE COCCI IN CLUSTERS ANAEROBIC BOTTLE ONLY CRITICAL RESULT CALLED TO, READ BACK BY AND VERIFIED WITH: PHARMD LISA CURRAN 04/18/21 @2050  BY JW  Culture (A)  Final    STAPHYLOCOCCUS CAPITIS THE SIGNIFICANCE OF ISOLATING THIS ORGANISM FROM A SINGLE SET OF BLOOD CULTURES WHEN MULTIPLE SETS ARE DRAWN IS UNCERTAIN. PLEASE NOTIFY THE MICROBIOLOGY DEPARTMENT WITHIN ONE WEEK IF SPECIATION AND SENSITIVITIES ARE REQUIRED. Performed at [facility], [place] [address], [place], [place] [address]    Report Status 04/20/2021 FINAL  Final  Blood Culture ID Panel (Reflexed)     Status: Abnormal   Collection Time: 04/17/21 10:08 PM  Result Value Ref Range Status   Enterococcus faecalis NOT DETECTED NOT DETECTED Final   Enterococcus Faecium NOT DETECTED NOT DETECTED Final   Listeria monocytogenes NOT DETECTED NOT DETECTED Final   Staphylococcus species DETECTED (A) NOT DETECTED Final    Comment: CRITICAL RESULT CALLED TO, READ BACK BY AND VERIFIED WITH: PHARMD LISA CURRAN 04/18/21 @2050  BY JW    Staphylococcus aureus (BCID) NOT DETECTED NOT DETECTED Final   Staphylococcus epidermidis NOT DETECTED NOT DETECTED Final   Staphylococcus lugdunensis NOT DETECTED NOT DETECTED Final   Streptococcus species NOT DETECTED NOT DETECTED Final   Streptococcus agalactiae NOT DETECTED NOT DETECTED Final   Streptococcus pneumoniae NOT DETECTED NOT DETECTED Final   Streptococcus pyogenes NOT DETECTED NOT DETECTED Final   A.calcoaceticus-baumannii NOT DETECTED NOT DETECTED Final   Bacteroides fragilis NOT DETECTED NOT DETECTED Final   Enterobacterales NOT DETECTED NOT DETECTED Final   Enterobacter cloacae complex NOT DETECTED NOT DETECTED Final   Escherichia coli NOT DETECTED NOT DETECTED Final   Klebsiella aerogenes NOT DETECTED NOT DETECTED Final   Klebsiella  oxytoca NOT DETECTED NOT DETECTED Final   Klebsiella pneumoniae NOT DETECTED NOT DETECTED Final   Proteus species NOT DETECTED NOT DETECTED Final   Salmonella species NOT DETECTED NOT DETECTED Final   Serratia marcescens NOT DETECTED NOT DETECTED Final   Haemophilus influenzae NOT DETECTED NOT DETECTED Final   Neisseria meningitidis NOT DETECTED NOT DETECTED Final   Pseudomonas aeruginosa NOT DETECTED NOT DETECTED Final   Stenotrophomonas maltophilia NOT DETECTED NOT DETECTED Final   Candida albicans NOT DETECTED NOT DETECTED Final   Candida auris NOT DETECTED NOT DETECTED Final   Candida glabrata NOT DETECTED NOT DETECTED Final   Candida krusei NOT DETECTED NOT DETECTED Final   Candida parapsilosis NOT DETECTED NOT DETECTED Final   Candida tropicalis NOT DETECTED NOT DETECTED Final   Cryptococcus neoformans/gattii NOT DETECTED NOT DETECTED Final    Comment: Performed at [facility], [address] [place], [place] [address]  Resp Panel by RT-PCR (Flu A&B, Covid) Nasopharyngeal Swab     Status: None   Collection Time: 04/17/21 10:10 PM   Specimen: Nasopharyngeal Swab; Nasopharyngeal(NP) swabs in vial transport medium  Result Value Ref Range Status   SARS Coronavirus 2 by RT PCR NEGATIVE NEGATIVE Final    Comment: (NOTE) SARS-CoV-2 target nucleic acids are NOT DETECTED.  The SARS-CoV-2 RNA is generally detectable in upper respiratory specimens during the acute phase of infection. The lowest concentration of SARS-CoV-2 viral copies this assay can detect is 138 copies/mL. A negative result does not preclude SARS-Cov-2 infection and should not be used as the sole basis for treatment or other patient management decisions. A negative result may occur with  improper specimen collection/handling, submission of specimen other than nasopharyngeal swab, presence of viral mutation(s) within the areas targeted by this assay, and inadequate number of viral copies(<138 copies/mL). A  negative result must be combined with clinical observations, patient history, and epidemiological information. The expected result is Negative.  Fact Sheet for Patients:  EntrepreneurPulse.com.au  Fact Sheet for Healthcare Providers:  IncredibleEmployment.be  This test is no t yet approved or cleared by the [place] FDA and  has been authorized for detection and/or diagnosis of SARS-CoV-2 by FDA under an Emergency Use Authorization (EUA). This EUA will remain  in effect (meaning this test can be used) for the duration of the COVID-19 declaration under Section 564(b)(1) of the Act, 21 U.S.C.section 360bbb-3(b)(1), unless the authorization is terminated  or revoked sooner.       Influenza A by PCR NEGATIVE NEGATIVE Final   Influenza B by PCR NEGATIVE NEGATIVE Final    Comment: (NOTE) The Xpert Xpress SARS-CoV-2/FLU/RSV plus assay is intended as an aid in the diagnosis of influenza from Nasopharyngeal swab specimens and should not be used as a sole basis for treatment. Nasal washings and aspirates are unacceptable for Xpert Xpress SARS-CoV-2/FLU/RSV testing.  Fact Sheet for Patients: EntrepreneurPulse.com.au  Fact Sheet for Healthcare Providers: IncredibleEmployment.be  This test is not yet approved or cleared by the [place] FDA and has been authorized for detection and/or diagnosis of SARS-CoV-2 by FDA under an Emergency Use Authorization (EUA). This EUA will remain in effect (meaning this test can be used) for the duration of the COVID-19 declaration under Section 564(b)(1) of the Act, 21 U.S.C. section 360bbb-3(b)(1), unless the authorization is terminated or revoked.  Performed at [facility], [place] [address], [place], [place] [address]   Urine Culture     Status: Abnormal   Collection Time: 04/18/21  1:12 PM   Specimen: In/Out Cath Urine  Result Value Ref Range Status   Specimen  Description IN/OUT CATH URINE  Final   Special Requests   Final    NONE Performed at [facility], [place] [address], [place], [place] [address]    Culture MULTIPLE SPECIES PRESENT, SUGGEST RECOLLECTION (A)  Final   Report Status 04/19/2021 FINAL  Final  Culture, blood (Routine X 2) w Reflex to ID Panel     Status: None (Preliminary result)   Collection Time: 04/19/21  4:47 PM   Specimen: Right Antecubital; Blood  Result Value Ref Range Status   Specimen Description RIGHT ANTECUBITAL  Final   Special Requests   Final    BOTTLES DRAWN AEROBIC AND ANAEROBIC Blood Culture results may not be optimal due to an excessive volume of blood received in culture bottles   Culture   Final    NO GROWTH < 24 HOURS Performed at [place] [address], [place], [place] [address]    Report Status PENDING  Incomplete  Culture, blood (Routine X 2) w Reflex to ID Panel     Status: None (Preliminary result)   Collection Time: 04/19/21  4:57 PM   Specimen: BLOOD LEFT ARM  Result Value Ref Range Status   Specimen Description BLOOD LEFT ARM  Final   Special Requests   Final    BOTTLES DRAWN AEROBIC AND ANAEROBIC Blood Culture adequate volume   Culture   Final    NO GROWTH < 24 HOURS Performed at [facility], [place] [address] [place], [place] [address]    Report Status PENDING  Incomplete          Radiology Studies: MR BRAIN WO CONTRAST  Result Date: 04/19/2021 CLINICAL DATA:  Mental status change, recent trauma EXAM: MRI HEAD WITHOUT CONTRAST TECHNIQUE: Multiplanar, multiecho pulse sequences of the brain and surrounding structures were obtained without intravenous contrast. COMPARISON:  CT head 04/17/2021, no prior MRI FINDINGS: Brain: No acute infarction, hemorrhage, hydrocephalus, extra-axial collection, or mass lesion. The ventricles and sulci are normal for age. Confluent T2 hyperintense signal in the periventricular white matter, likely the sequela of chronic small vessel  ischemic disease. Punctate focus of susceptibility in the right cerebellar hemisphere, possibly the sequela of a punctate microhemorrhage. Additional susceptibility along the falx, which correlates with calcifications seen on the 04/17/2021 CT. Remote lacunar infarct in right basal ganglia. Vascular: Normal flow voids. Skull and upper cervical spine: Normal marrow signal. Sinuses/Orbits: Negative.  Status post bilateral lens replacements. Other: The mastoids are well aerated. IMPRESSION: No acute osseous abnormality. No etiology is seen for the patient's mental status change. Electronically Signed   By: Merilyn Baba M.D.   On: 04/19/2021 16:04        Scheduled Meds:  aspirin  81 mg Oral Daily   diltiazem  240 mg Oral Daily   heparin  5,000 Units Subcutaneous Q8H   hydrALAZINE  25 mg Oral Q8H   insulin aspart  0-6 Units Subcutaneous TID WC   multivitamin with minerals  1 tablet Oral Daily   senna  1 tablet Oral BID   Continuous Infusions:  cefTRIAXone (ROCEPHIN)  IV 1 g (04/19/21 1331)     LOS: 3 days       Hosie Poisson, MD [facility]   To contact the attending provider between 7A-7P or the covering provider during after hours 7P-7A, please log into the web site [email or web address] and access using universal [place] password for that web site. If you do not have the password, please call the hospital operator.  04/20/2021, 1:50 PM

## 2021-04-20 NOTE — Progress Notes (Addendum)
   04/20/21 1355  Clinical Encounter Type  Visited With Patient and family together  Visit Type Follow-up  Referral From Nurse  Consult/Referral To Chaplain   The chaplain received a message from the nurse, Aniceto Boss, that the patient had completed the Advance Directive and was ready for the notary. The niece had another legal document that required notary that was given to her by CSW dept.Advised her this chaplain is unfamiliar with the document and she needs to speak with CSW for clarification because chaplains cannot answer questions pertaining to the document only the Advance Directive. The Advance Directive was notarized by Yvone Neu and witnessed by two volunteers. Copies were provided to the patient's niece, [name], and copy given to the unit secretary to scan into the patient's file. This note was prepared by Jeanine Luz, M.Div..  For questions please contact by phone [phone].

## 2021-04-20 NOTE — TOC Initial Note (Addendum)
Transition of Care [facility]) - Initial/Assessment Note    Patient Details  Name: Penny [name] MRN: [id number] Date of Birth: Oct 08, 1934  Transition of [facility]) CM/SW Contact:    Tresa Endo Phone Number: 04/20/2021, 4:34 PM  Clinical Narrative:                 CSW received SNF consult. CSW met with pt and pt niece Penny [name]. CSW introduced self and explained role at the hospital. Pt [place] [address] [facility]  Pt reports that PTA the pt lived home alone. PT reports pt needs mod/MaxA with walker. PT reports at home pt ambulated has DME at home such as 2 wheeled walker, 4 wheeled walker, shower seat, grab bars, and toilet grab bars.  CSW reviewed PT/OT recommendations for SNF. Pt niece would like to send pt to SNF until she is financially and mentally stable to live alone again. Pt gave CSW permission to fax out to facilities in the area. [name] has asked for [facility] bc it is close to her home and other family members as well. CSW gave pt medicare.gov rating list to review. CSW faxed pt out to [facility] and contacted Juliann Pulse for bed placement, (she has a  bed) CSW contacted the doctor for DC planning and pt may dc tomorrow so csw will start auth information.  CSW completed pt Josem Kaufmann, it was approved with reference number [id number].  CSW will continue to follow.    Expected Discharge Plan: Skilled Nursing Facility Barriers to Discharge: Continued Medical Work up   Patient Goals and CMS Choice Patient states their goals for this hospitalization and ongoing recovery are:: Rehabilitation CMS Medicare.gov Compare Post Acute Care list provided to:: Patient Choice offered to / list presented to : Patient  Expected Discharge Plan and Services Expected Discharge Plan: [place] In-house Referral: Clinical Social Work   Post Acute Care Choice: [place] Living arrangements for the past 2 months: Mobile                                       Prior Living Arrangements/Services Living arrangements for the past 2 months: Single Family Home   Patient language and need for interpreter reviewed:: Yes Do you feel safe going back to the place where you live?: Yes      Need for Family Participation in Patient Care: Yes (Comment) Care giver support system in place?: Yes (comment)   Criminal Activity/Legal Involvement Pertinent to Current Situation/Hospitalization: No - Comment as needed  Activities of Daily Living      Permission Sought/Granted Permission sought to share information with : Family Supports, Chartered certified accountant granted to share information with : Yes, Verbal Permission Granted  Share Information with NAME: Penny [name] (Niece)   [phone]  Permission granted to share info w AGENCY: SNF  Permission granted to share info w Relationship: Penny [name] (Niece)   [phone]  Permission granted to share info w Contact Information: Penny [name] (Niece)   [phone]  Emotional Assessment Appearance:: Appears stated age Attitude/Demeanor/Rapport: Unable to Assess Affect (typically observed): Unable to Assess Orientation: : Oriented to Self, Oriented to  Time Alcohol / Substance Use: Not Applicable Psych Involvement: No (comment)  Admission diagnosis:  Weakness [R53.1] Hypothermia, initial encounter [T68.XXXA] Acute metabolic encephalopathy [T62.56] Hypothermia due to exposure [T68.XXXA] Patient Active Problem List   Diagnosis Date Noted   Acute  metabolic encephalopathy 18/20/9906   Hypothermia due to exposure 04/17/2021   Pressure ulcer, shoulder blades, stage II ([place]) - right posterior shoulder 04/17/2021   Pressure ulcer, hip, right, unstageable ([place]) 04/17/2021   CKD (chronic kidney disease) stage 3, GFR 30-59 ml/min (HCC) 04/17/2021   Pain due to onychomycosis of toenails of both feet 02/01/2021   Type 2 diabetes mellitus with vascular disease ([place])  02/01/2021   PCP:  Kelton Pillar, MD Pharmacy:   [facility], [place] [address] Phone: [phone] Fax: [phone]     Social Determinants of Health (SDOH) Interventions    Readmission Risk Interventions No flowsheet data found.

## 2021-04-20 NOTE — Progress Notes (Signed)
Pt refusing morning CBG and Medications. Will attempt later this am. Pleasantly confused states she can take care of herself.

## 2021-04-20 NOTE — Progress Notes (Signed)
Pt confused last evening. Pt refused bedtime CBG check and use of purewick for urine output measuring.

## 2021-04-20 NOTE — Progress Notes (Signed)
     Referral received for [facility] :goals of care discussion. Chart reviewed and updates received from RN.  Attempted to contact patient's niece [name]. Unable to reach. Voicemail left with contact information given. Request arrangements for goals of care meeting.   PMT will re-attempt to contact family at a later time/date. Detailed note and recommendations to follow once GOC has been completed.   Thank you for your referral and allowing PMT to assist in Penny [name]'s care.   Alda Lea, AGPCNP-BC Palliative Medicine Team  Phone: [phone] Pager: [phone] Amion: N. [place]   NO CHARGE

## 2021-04-20 NOTE — Progress Notes (Signed)
[name] has been completed.   Preliminary results in CV Proc.   Penny [name] 04/20/2021 3:11 PM

## 2021-04-20 NOTE — Progress Notes (Signed)
Initial Nutrition Assessment  DOCUMENTATION CODES:   Non-severe (moderate) malnutrition in context of social or environmental circumstances  INTERVENTION:  Liberalized to regular diet, discussed with MD who agreed  Encourage PO intake  Ensure Enlive po BID, each supplement provides 350 kcal and 20 grams of protein  Magic cup TID with meals, each supplement provides 290 kcal and 9 grams of protein  NUTRITION DIAGNOSIS:   Moderate Malnutrition related to social / environmental circumstances as evidenced by moderate fat depletion, severe muscle depletion.  GOAL:   Patient will meet greater than or equal to 90% of their needs  MONITOR:   PO intake, Supplement acceptance, Weight trends, Skin, I & O's  REASON FOR ASSESSMENT:   Consult Assessment of nutrition requirement/status  ASSESSMENT:   86 y.o presents to ED after a fall at home. Admitted for continued hypothermia and acute metabolic encephalopathy. PMH includes T2DM, vascular disease and CKD stage III.  Pt resting in bed during visit and appeared confused. Until her current admission, she was shopping and cooking her own meals and would prepare meals for the week or month. She reports eating a limited variety of foods. She typically eats 3 meals per day consisting of eggs, bananas, oatmeal, nuts, salmon, and mustard.    Pt states that after eating she will lay down and sometimes may vomit. It was unclear what the cause may be. She mentions that she goes back and forth between constipation and diarrhea.  She reports having recent weight loss in the last 6 months with a usual weight of 220 lb and being 135 lb at her last visit. Limited weight history per chart review.  Pt meets criteria for moderate malnutrition, however suspect malnutrition is more severe but d/t limited weight history unable to confirm.  Noted therapies recommending SNF at discharge. Palliative consult pending for goals of care discussion.  Meal  Completion: 0% x 1 meal; 100% x1 meal  Medications: SSI, MVI with minerals, senokot, IV abx Labs: BUN 27; creatinine 1.13 CBG's: 71-138 x 24 hours I/O's: 829mL since admit  NUTRITION - FOCUSED PHYSICAL EXAM:  Flowsheet Row Most Recent Value  Orbital Region Moderate depletion  Upper Arm Region Severe depletion  Thoracic and Lumbar Region Moderate depletion  Buccal Region Moderate depletion  Temple Region Mild depletion  Clavicle Bone Region Severe depletion  Clavicle and Acromion Bone Region Severe depletion  Scapular Bone Region Severe depletion  Dorsal Hand Severe depletion  Patellar Region Moderate depletion  Anterior Thigh Region Severe depletion  Posterior Calf Region Moderate depletion  Edema (RD Assessment) None  Hair Reviewed  Eyes Reviewed  Mouth Reviewed  Skin Reviewed  Nails Reviewed       Diet Order:   Diet Order             Diet regular Room service appropriate? Yes; Fluid consistency: Thin  Diet effective now                   EDUCATION NEEDS:   Not appropriate for education at this time  Skin:  Skin Assessment: Skin Integrity Issues: Skin Integrity Issues:: Stage I, Stage II, DTI DTI: R knee Stage I: L knee Stage II: sacrum; R hip  Last BM:  unknown  Height:   Ht Readings from Last 1 Encounters:  04/19/21 5\' 5"  (1.651 m)    Weight:   Wt Readings from Last 1 Encounters:  04/20/21 58.5 kg    Ideal Body Weight:  56.8 kg  BMI:  Body mass  index is 21.46 kg/m.  Estimated Nutritional Needs:   Kcal:  1600-1800  Protein:  80-90g  Fluid:  >1.6L  Clayborne Dana, RDN, LDN Clinical Nutrition

## 2021-04-21 DIAGNOSIS — M19011 Primary osteoarthritis, right shoulder: Secondary | ICD-10-CM | POA: Diagnosis not present

## 2021-04-21 DIAGNOSIS — L0231 Cutaneous abscess of buttock: Secondary | ICD-10-CM | POA: Diagnosis not present

## 2021-04-21 DIAGNOSIS — L97919 Non-pressure chronic ulcer of unspecified part of right lower leg with unspecified severity: Secondary | ICD-10-CM | POA: Diagnosis not present

## 2021-04-21 DIAGNOSIS — L89102 Pressure ulcer of unspecified part of back, stage 2: Secondary | ICD-10-CM | POA: Diagnosis not present

## 2021-04-21 DIAGNOSIS — N1831 Chronic kidney disease, stage 3a: Secondary | ICD-10-CM | POA: Diagnosis not present

## 2021-04-21 DIAGNOSIS — I739 Peripheral vascular disease, unspecified: Secondary | ICD-10-CM | POA: Diagnosis not present

## 2021-04-21 DIAGNOSIS — L89152 Pressure ulcer of sacral region, stage 2: Secondary | ICD-10-CM | POA: Diagnosis not present

## 2021-04-21 DIAGNOSIS — N19 Unspecified kidney failure: Secondary | ICD-10-CM | POA: Diagnosis not present

## 2021-04-21 DIAGNOSIS — L03115 Cellulitis of right lower limb: Secondary | ICD-10-CM | POA: Diagnosis not present

## 2021-04-21 DIAGNOSIS — E43 Unspecified severe protein-calorie malnutrition: Secondary | ICD-10-CM | POA: Diagnosis not present

## 2021-04-21 DIAGNOSIS — Z66 Do not resuscitate: Secondary | ICD-10-CM | POA: Diagnosis not present

## 2021-04-21 DIAGNOSIS — L98499 Non-pressure chronic ulcer of skin of other sites with unspecified severity: Secondary | ICD-10-CM | POA: Diagnosis not present

## 2021-04-21 DIAGNOSIS — L8995 Pressure ulcer of unspecified site, unstageable: Secondary | ICD-10-CM | POA: Diagnosis not present

## 2021-04-21 DIAGNOSIS — E1169 Type 2 diabetes mellitus with other specified complication: Secondary | ICD-10-CM | POA: Diagnosis not present

## 2021-04-21 DIAGNOSIS — Z515 Encounter for palliative care: Secondary | ICD-10-CM

## 2021-04-21 DIAGNOSIS — L89214 Pressure ulcer of right hip, stage 4: Secondary | ICD-10-CM | POA: Diagnosis not present

## 2021-04-21 DIAGNOSIS — M6281 Muscle weakness (generalized): Secondary | ICD-10-CM | POA: Diagnosis not present

## 2021-04-21 DIAGNOSIS — Z7189 Other specified counseling: Secondary | ICD-10-CM

## 2021-04-21 DIAGNOSIS — E440 Moderate protein-calorie malnutrition: Secondary | ICD-10-CM | POA: Diagnosis not present

## 2021-04-21 DIAGNOSIS — D638 Anemia in other chronic diseases classified elsewhere: Secondary | ICD-10-CM | POA: Diagnosis not present

## 2021-04-21 DIAGNOSIS — R5383 Other fatigue: Secondary | ICD-10-CM | POA: Diagnosis not present

## 2021-04-21 DIAGNOSIS — R770 Abnormality of albumin: Secondary | ICD-10-CM | POA: Diagnosis not present

## 2021-04-21 DIAGNOSIS — Z743 Need for continuous supervision: Secondary | ICD-10-CM | POA: Diagnosis not present

## 2021-04-21 DIAGNOSIS — T68XXXA Hypothermia, initial encounter: Secondary | ICD-10-CM | POA: Diagnosis not present

## 2021-04-21 DIAGNOSIS — L89890 Pressure ulcer of other site, unstageable: Secondary | ICD-10-CM | POA: Diagnosis not present

## 2021-04-21 DIAGNOSIS — Z9181 History of falling: Secondary | ICD-10-CM | POA: Diagnosis not present

## 2021-04-21 DIAGNOSIS — Z87891 Personal history of nicotine dependence: Secondary | ICD-10-CM | POA: Diagnosis not present

## 2021-04-21 DIAGNOSIS — D696 Thrombocytopenia, unspecified: Secondary | ICD-10-CM | POA: Diagnosis not present

## 2021-04-21 DIAGNOSIS — R5381 Other malaise: Secondary | ICD-10-CM | POA: Diagnosis not present

## 2021-04-21 DIAGNOSIS — R109 Unspecified abdominal pain: Secondary | ICD-10-CM | POA: Diagnosis not present

## 2021-04-21 DIAGNOSIS — N390 Urinary tract infection, site not specified: Secondary | ICD-10-CM | POA: Diagnosis not present

## 2021-04-21 DIAGNOSIS — L89210 Pressure ulcer of right hip, unstageable: Secondary | ICD-10-CM | POA: Diagnosis not present

## 2021-04-21 DIAGNOSIS — N1832 Chronic kidney disease, stage 3b: Secondary | ICD-10-CM | POA: Diagnosis not present

## 2021-04-21 DIAGNOSIS — D539 Nutritional anemia, unspecified: Secondary | ICD-10-CM | POA: Diagnosis not present

## 2021-04-21 DIAGNOSIS — M4802 Spinal stenosis, cervical region: Secondary | ICD-10-CM | POA: Diagnosis not present

## 2021-04-21 DIAGNOSIS — G3184 Mild cognitive impairment, so stated: Secondary | ICD-10-CM | POA: Diagnosis not present

## 2021-04-21 DIAGNOSIS — M6282 Rhabdomyolysis: Secondary | ICD-10-CM | POA: Diagnosis not present

## 2021-04-21 DIAGNOSIS — R600 Localized edema: Secondary | ICD-10-CM | POA: Diagnosis not present

## 2021-04-21 DIAGNOSIS — G9341 Metabolic encephalopathy: Secondary | ICD-10-CM | POA: Diagnosis not present

## 2021-04-21 DIAGNOSIS — I129 Hypertensive chronic kidney disease with stage 1 through stage 4 chronic kidney disease, or unspecified chronic kidney disease: Secondary | ICD-10-CM | POA: Diagnosis not present

## 2021-04-21 DIAGNOSIS — Z1159 Encounter for screening for other viral diseases: Secondary | ICD-10-CM | POA: Diagnosis not present

## 2021-04-21 DIAGNOSIS — N183 Chronic kidney disease, stage 3 unspecified: Secondary | ICD-10-CM | POA: Diagnosis not present

## 2021-04-21 DIAGNOSIS — T68XXXD Hypothermia, subsequent encounter: Secondary | ICD-10-CM | POA: Diagnosis not present

## 2021-04-21 DIAGNOSIS — R531 Weakness: Secondary | ICD-10-CM

## 2021-04-21 DIAGNOSIS — D649 Anemia, unspecified: Secondary | ICD-10-CM | POA: Diagnosis not present

## 2021-04-21 DIAGNOSIS — D631 Anemia in chronic kidney disease: Secondary | ICD-10-CM | POA: Diagnosis not present

## 2021-04-21 DIAGNOSIS — E1159 Type 2 diabetes mellitus with other circulatory complications: Secondary | ICD-10-CM | POA: Diagnosis not present

## 2021-04-21 DIAGNOSIS — N179 Acute kidney failure, unspecified: Secondary | ICD-10-CM | POA: Diagnosis not present

## 2021-04-21 DIAGNOSIS — E860 Dehydration: Secondary | ICD-10-CM | POA: Diagnosis not present

## 2021-04-21 DIAGNOSIS — L299 Pruritus, unspecified: Secondary | ICD-10-CM | POA: Diagnosis not present

## 2021-04-21 DIAGNOSIS — F0390 Unspecified dementia without behavioral disturbance: Secondary | ICD-10-CM | POA: Diagnosis present

## 2021-04-21 DIAGNOSIS — S71001S Unspecified open wound, right hip, sequela: Secondary | ICD-10-CM | POA: Diagnosis not present

## 2021-04-21 DIAGNOSIS — I9581 Postprocedural hypotension: Secondary | ICD-10-CM | POA: Diagnosis not present

## 2021-04-21 DIAGNOSIS — M170 Bilateral primary osteoarthritis of knee: Secondary | ICD-10-CM | POA: Diagnosis not present

## 2021-04-21 DIAGNOSIS — D509 Iron deficiency anemia, unspecified: Secondary | ICD-10-CM | POA: Diagnosis not present

## 2021-04-21 DIAGNOSIS — R509 Fever, unspecified: Secondary | ICD-10-CM | POA: Diagnosis not present

## 2021-04-21 DIAGNOSIS — Z7401 Bed confinement status: Secondary | ICD-10-CM | POA: Diagnosis not present

## 2021-04-21 DIAGNOSIS — Z20822 Contact with and (suspected) exposure to covid-19: Secondary | ICD-10-CM | POA: Diagnosis not present

## 2021-04-21 DIAGNOSIS — M160 Bilateral primary osteoarthritis of hip: Secondary | ICD-10-CM | POA: Diagnosis not present

## 2021-04-21 DIAGNOSIS — E1122 Type 2 diabetes mellitus with diabetic chronic kidney disease: Secondary | ICD-10-CM | POA: Diagnosis not present

## 2021-04-21 DIAGNOSIS — R001 Bradycardia, unspecified: Secondary | ICD-10-CM | POA: Diagnosis not present

## 2021-04-21 LAB — GLUCOSE, CAPILLARY
Glucose-Capillary: 140 mg/dL — ABNORMAL HIGH (ref 70–99)
Glucose-Capillary: 122 mg/dL — ABNORMAL HIGH (ref 70–99)
Glucose-Capillary: 144 mg/dL — ABNORMAL HIGH (ref 70–99)
Glucose-Capillary: 244 mg/dL — ABNORMAL HIGH (ref 70–99)

## 2021-04-21 LAB — BASIC METABOLIC PANEL
Anion gap: 6 (ref 5–15)
BUN: 22 mg/dL (ref 8–23)
CO2: 29 mmol/L (ref 22–32)
Calcium: 8.7 mg/dL — ABNORMAL LOW (ref 8.9–10.3)
Chloride: 102 mmol/L (ref 98–111)
Creatinine, Ser: 0.93 mg/dL (ref 0.44–1.00)
GFR, Estimated: >60 mL/min (ref >60)
Glucose, Bld: 170 mg/dL — ABNORMAL HIGH (ref 70–99)
Potassium: 3.9 mmol/L (ref 3.5–5.1)
Sodium: 137 mmol/L (ref 135–145)

## 2021-04-21 LAB — RESP PANEL BY RT-PCR (FLU A&B, COVID) ARPGX2
Influenza A by PCR: NEGATIVE
Influenza B by PCR: NEGATIVE
SARS Coronavirus 2 by RT PCR: NEGATIVE

## 2021-04-21 MED ORDER — DILTIAZEM HCL ER COATED BEADS 240 MG PO CP24: 240.0000 mg | ORAL_CAPSULE | Freq: Every day | ORAL | 3 refills | Status: DC

## 2021-04-21 MED ORDER — SENNA 8.6 MG PO TABS: 1.0000 | ORAL_TABLET | Freq: Two times a day (BID) | ORAL | 0 refills | Status: DC

## 2021-04-21 MED ORDER — ENSURE ENLIVE PO LIQD
237.0000 mL | Freq: Two times a day (BID) | ORAL | 2 refills | Status: DC
Start: 2021-04-21 — End: 2021-05-16

## 2021-04-21 MED ORDER — HYDRALAZINE HCL 25 MG PO TABS: 25.0000 mg | ORAL_TABLET | Freq: Three times a day (TID) | ORAL | Status: AC

## 2021-04-21 NOTE — Progress Notes (Signed)
Occupational Therapy Treatment Patient Details Name: Penny [name] MRN: [id number] DOB: 07-Sep-1934 Today's Date: 04/21/2021   History of present illness This 86 y.o. female admitted after a fall at home and being found down for an unknown period of time.  She was noted to by hypothermic and had metabolic encephalopathy.  CK was elevated, and was noted to have pressure ulcer Rt shoulder blace, and Rt hip.  PMH includes: CKD   OT comments  Patient received supine in bed and agreeable to sit on eob to perform self feeding. Patient required setup and occasional verbal cues for item location and to stay on task and ate 75% of meal.  Patient required min to mod assist for sitting balance due to posterior leaning. Patient required max assist to return to supine and perform hand and face hygiene while supine. Acute OT to continue to follow.    Recommendations for follow up therapy are one component of a multi-disciplinary discharge planning process, led by the attending physician.  Recommendations may be updated based on patient status, additional functional criteria and insurance authorization.    Follow Up Recommendations  SNF    Equipment Recommendations  None recommended by OT    Recommendations for Other Services      Precautions / Restrictions Precautions Precautions: Fall Restrictions Weight Bearing Restrictions: No       Mobility Bed Mobility Overal bed mobility: Needs Assistance Bed Mobility: Supine to Sit;Sit to Supine     Supine to sit: Mod assist Sit to supine: Max assist   General bed mobility comments: patient required more assistance to get to supine due to fatigue    Transfers                      Balance Overall balance assessment: Needs assistance Sitting-balance support: Feet unsupported;Single extremity supported Sitting balance-Leahy Scale: Poor Sitting balance - Comments: patient required min to mod assist for sitting balance while performing self  feeding Postural control: Posterior lean                                 ADL either performed or assessed with clinical judgement   ADL Overall ADL's : Needs assistance/impaired Eating/Feeding: Minimal assistance;Moderate assistance;Sitting Eating/Feeding Details (indicate cue type and reason): required assistance for sitting balance while self feeding Grooming: Wash/dry hands;Wash/dry face;Supervision/safety;Cueing for sequencing;Bed level Grooming Details (indicate cue type and reason): washed hands and face at bedl evel                               General ADL Comments: performed self feeding seated on eob with assistance for balance and verbal cues     Vision       Perception     Praxis      Cognition Arousal/Alertness: Awake/alert Behavior During Therapy: WFL for tasks assessed/performed Overall Cognitive Status: Impaired/Different from baseline Area of Impairment: Orientation;Memory;Safety/judgement;Problem solving                 Orientation Level: Person;Place Current Attention Level: Focused;Sustained Memory: Decreased short-term memory;Decreased recall of precautions Following Commands: Follows one step commands inconsistently;Follows one step commands with increased time Safety/Judgement: Decreased awareness of safety;Decreased awareness of deficits Awareness: Intellectual Problem Solving: Slow processing;Difficulty sequencing;Requires verbal cues;Requires tactile cues General Comments: cues on posture when sitting on eob        Exercises  Shoulder Instructions       General Comments      Pertinent Vitals/ Pain       Pain Assessment: No/denies pain  Home Living                                          Prior Functioning/Environment              Frequency  Min 2X/week        Progress Toward Goals  OT Goals(current goals can now be found in the care plan section)  Progress towards OT  goals: Progressing toward goals  Acute Rehab OT Goals Patient Stated Goal: to be able to sit up for a while OT Goal Formulation: With patient Time For Goal Achievement: 05/03/21 Potential to Achieve Goals: Good ADL Goals Pt Will Perform Eating: with modified independence;sitting Pt Will Perform Grooming: with set-up;with supervision;sitting Pt Will Perform Upper Body Bathing: with set-up;with supervision;sitting Pt Will Transfer to Toilet: with mod assist;stand pivot transfer;bedside commode  Plan Discharge plan remains appropriate    Co-evaluation                 AM-PAC OT "6 Clicks" Daily Activity     Outcome Measure   Help from another person eating meals?: A Little Help from another person taking care of personal grooming?: A Little Help from another person toileting, which includes using toliet, bedpan, or urinal?: Total Help from another person bathing (including washing, rinsing, drying)?: A Lot Help from another person to put on and taking off regular upper body clothing?: A Lot Help from another person to put on and taking off regular lower body clothing?: Total 6 Click Score: 12    End of Session    OT Visit Diagnosis: Unsteadiness on feet (R26.81);Cognitive communication deficit (R41.841)   Activity Tolerance Patient tolerated treatment well   Patient Left in bed;with call bell/phone within reach;with bed alarm set;with nursing/sitter in room   Nurse Communication Other (comment) (discussed self feeding and sitting balance)        Time: 2876-8115 OT Time Calculation (min): 32 min  Charges: OT General Charges $OT Visit: 1 Visit OT Treatments $Self Care/Home Management : 23-37 mins  Lodema Hong, [place] 04/21/2021, 10:07 AM

## 2021-04-21 NOTE — Consult Note (Signed)
Palliative Care Consult Note                                  Date: 04/21/2021   Patient Name: Penny [name]  DOB: October 02, 1934  MRN: [id number]  Age / Sex: 86 y.o., female  PCP: Kelton Pillar, MD Referring Physician: Hosie Poisson, MD  Reason for Consultation: Establishing goals of care  HPI/Patient Profile: Palliative Care consult requested for goals of care discussion in this 86 y.o. female  with past medical history of  diabetes, CKD III, and vascular disease. She was admitted on 04/17/2021 from home with hypothermia. It was reported patient was found on the floor in the home by a neighbor. During work-up core temp was 94.6 and patient placed on Bair hugger. CT of head and spine negative for fracture.   Past Medical History:  Diagnosis Date  . CKD (chronic kidney disease) stage 3, GFR 30-59 ml/min (HCC) 04/17/2021     Subjective:   This NP Osborne Oman reviewed medical records, received report from team, assessed the patient and then met at the patient's bedside with patient and her niece/POA Penny [name] to discuss diagnosis, prognosis, GOC, EOL wishes disposition and options.   Concept of Palliative Care was introduced as specialized medical care for people and their families living with serious illness.  It focuses on providing relief from the symptoms and stress of a serious illness.  The goal is to improve quality of life for both the patient and the family. Values and goals of care important to patient and family were attempted to be elicited.  I created space and opportunity for patient and family to explore state of health prior to admission, thoughts, and feelings. Per niece patient lived in a senior living apartment alone. She has been independent of most ADLs however with some noticeable increase in weakness and decrease in appetite. Family assisted with errands and appointments in addition to a close neighbor. Patient was ambulatory in  the home with use of walker on occassions but of note patient would not always comply with use. Patient and niece endorses weight loss of at least 15lb over the past 6 months.   We discussed Her current illness and what it means in the larger context of Her on-going co-morbidities. Natural disease trajectory and expectations were discussed.  Penny [name] verbalized understanding of current illness and co-morbidities. She is emotional expressing her awareness of patient's  decline and knowing her life is becoming more limited. She does not want her aunt to suffer. She speaks to her being the last of 7 siblings and that she recently had to provide supportive care to another aunt and uncle both whom have passed away in the past 9 months. Emotional support provided.   I discussed the importance of continued conversation with family and their medical providers regarding overall plan of care and treatment options, ensuring decisions are within the context of the patients values and GOCs.  Questions and concerns were addressed.  Hard Choices booklet left for review. The family was encouraged to call with questions or concerns.  PMT will continue to support holistically as needed.  Life Review: Patient lived alone. Never married or had children. She worked as Insurance account manager. Christian faith.    Objective:   Primary Diagnoses: Present on Admission: . Pressure ulcer, shoulder blades, stage II ([place]) - right posterior shoulder . Type 2 diabetes mellitus with vascular disease ([place]) . Acute metabolic  encephalopathy   Scheduled Meds: . aspirin  81 mg Oral Daily  . diltiazem  240 mg Oral Daily  . feeding supplement  237 mL Oral BID BM  . heparin  5,000 Units Subcutaneous Q8H  . hydrALAZINE  25 mg Oral Q8H  . insulin aspart  0-6 Units Subcutaneous TID WC  . multivitamin with minerals  1 tablet Oral Daily  . senna  1 tablet Oral BID    Continuous Infusions: . cefTRIAXone (ROCEPHIN)  IV 1 g (04/20/21 1507)     PRN Meds: acetaminophen **OR** acetaminophen, hydrALAZINE, hydrOXYzine, LORazepam, ondansetron **OR** ondansetron (ZOFRAN) IV  Allergies  Allergen Reactions  . Glipizide Rash    Review of Systems  Constitutional:  Positive for fatigue.  Neurological:  Positive for weakness.  Unless otherwise noted, a complete review of systems is negative.  Physical Exam General: NAD, thin, chronically-ill appearing Cardiovascular: regular rate and rhythm Pulmonary: diminished bilaterally  Abdomen: soft, nontender, + bowel sounds Extremities: no edema, no joint deformities Skin: no rashes, warm and dry Neurological: AAO x3, mood appropriate, flat  Vital Signs:  BP (!) 161/97 (BP Location: Right Arm)   Pulse (!) 56   Temp 97.6 F (36.4 C) (Oral)   Resp 19   Ht '5\' 5"'  (1.651 m)   Wt 58.1 kg   LMP  (LMP Unknown)   SpO2 99%   BMI 21.31 kg/m  Pain Scale: 0-10   Pain Score: 0-No pain  SpO2: SpO2: 99 % O2 Device:SpO2: 99 % O2 [name] Rate: .   IO: Intake/output summary:  Intake/Output Summary (Last 24 hours) at 04/21/2021 1414 Last data filed at 04/21/2021 0646 Gross per 24 hour  Intake 360.33 ml  Output 675 ml  Net -314.67 ml    LBM: Last BM Date:  (PTA) Baseline Weight: Weight: 63.5 kg Most recent weight: Weight: 58.1 kg      Palliative Assessment/Data: PPS 30%   Advanced Care Planning:   Primary Decision Maker: HCPOA  Code Status/Advance Care Planning: DNR  A discussion was had today regarding advanced directives. Concepts specific to code status, artifical feeding and hydration, continued IV antibiotics and rehospitalization was had.    Patient recently completed advanced directive naming her niece Penny [name] as her HCPOA. Patient and niece confirm wishes for no artificial feeding/PEG.   I discussed at length her current full code status with consideration to her advanced age, current illness, and co-morbidities. They mutually agreed to DNR/DNI. Education provided on  what DNR would look like. They have again confirmed DNR wishes.   The difference between a aggressive medical intervention path and a palliative comfort care path was discussed. The MOST form was introduced and discussed. The patient and family outlined their wishes for the following treatment decisions:  Cardiopulmonary Resuscitation: Do Not Attempt Resuscitation (DNR/No CPR)  Medical Interventions: Limited Additional Interventions: Use medical treatment, IV fluids and cardiac monitoring as indicated, DO NOT USE intubation or mechanical ventilation. May consider use of less invasive airway support such as BiPAP or CPAP. Also provide comfort measures. Transfer to the hospital if indicated. Avoid intensive care.   Antibiotics: Determine use of limitation of antibiotics when infection occurs  IV Fluids: IV fluids for a defined trial period  Feeding Tube: No feeding tube     Hospice and Palliative Care services outpatient were explained and offered. Patient and family verbalized their understanding and awareness of both palliative and hospice's goals and philosophy of care. Niece would like ongoing outpatient Palliative support with awareness they may  transition care to focus on comfort and hospice care by discussing with medical team.   Assessment & Plan:   SUMMARY OF RECOMMENDATIONS   DNR/DNI-as requested and confirmed by patient and niece (POA) (completed form on chart) MOST form reviewed and completed as requested (see above and completed document on chart)  Continue with current plan of care  Family clear in expressed goals to continue to treat allowing patient every opportunity to thrive. They are aware of decreasing quality of life and would not wish for her to suffer. Goal is for SNF rehab with continued evaluation for possible [facility]-term care placement. If patient continued to decline or showed no improvement niece would be open to considering a more comfort focused level of care.   Recommendations for outpatient palliative support (TOC referral placed)  PMT will continue to support and follow as needed. Please call team line with urgent needs.  Symptom Management:  Per Attending  Palliative Prophylaxis:  Aspiration, Bowel Regimen, Delirium Protocol, Eye Care, Frequent Pain Assessment, Oral Care, and Palliative Wound Care  Additional Recommendations (Limitations, Scope, Preferences): Full Scope Treatment, No Artificial Feeding, No Hemodialysis, No Tracheostomy, and DNR/DNI   Psycho-social/Spiritual:  Desire for further Chaplaincy support: no Additional Recommendations: Education on Hospice and ongoing goals of care   Prognosis:  Unable to determine [facility])   Discharge Planning:  [facility] for rehab with Palliative care service follow-up   Discussed with: Dr. Karleen Hampshire and RN.   Patient and niece expressed understanding and was in agreement with this plan.   Time In: 1330 Time Out: 1420 Time Total: 50 min  Visit consisted of counseling and education dealing with the complex and emotionally intense issues of symptom management and palliative care in the setting of serious and potentially life-threatening illness.Greater than 50%  of this time was spent counseling and coordinating care related to the above assessment and plan.  Signed by:  Alda Lea, AGPCNP-BC Palliative Medicine Team  Phone: [phone] Pager: [phone] Amion: Bjorn Pippin   Thank you for allowing the Palliative Medicine Team to assist in the care of this patient. Please utilize secure chat with additional questions, if there is no response within 30 minutes please call the above phone number. Palliative Medicine Team providers are available by phone from 7am to 5pm daily and can be reached through the team cell phone.  Should this patient require assistance outside of these hours, please call the patient's attending physician.

## 2021-04-21 NOTE — TOC Transition Note (Signed)
Transition of Care [facility]) - CM/SW Discharge Note   Patient Details  Name: Penny [name] MRN: [id number] Date of Birth: 03/23/1935  [facility] CM/SW Contact:  Tresa Endo Phone Number: 04/21/2021, 12:10 PM   Clinical Narrative:    Patient will DC to: [facility] date: 04/21/2021 Family notified: PT niece Transport by: Corey Harold   Per MD patient ready for DC to [facility] care. RN to call report prior to discharge [phone]). RN, patient, patient's family, and facility notified of DC. Discharge Summary and FL2 sent to facility. DC packet on chart. Ambulance transport requested for patient.   CSW will sign off for now as social work intervention is no longer needed. Please consult [place] again if new needs arise.     Final next level of care: Skilled Nursing Facility Barriers to Discharge: Continued Medical Work up   Patient Goals and CMS Choice Patient states their goals for this hospitalization and ongoing recovery are:: Rehabilitation CMS Medicare.gov Compare Post Acute Care list provided to:: Patient Choice offered to / list presented to : Patient  Discharge Placement                       Discharge Plan and Services In-house Referral: Clinical Social Work   Post Acute Care Choice: [facility]                               Social Determinants of Health (SDOH) Interventions     Readmission Risk Interventions No flowsheet data found.

## 2021-04-21 NOTE — Discharge Summary (Signed)
Physician Discharge Summary  ESBEIDY MCLAINE YCX:[id number] DOB: 07-06-1935 DOA: 04/17/2021  PCP: Kelton Pillar, MD  Admit date: 04/17/2021 Discharge date: 04/21/2021  Admitted From: Home  Disposition:  SNF  Recommendations for Outpatient Follow-up:  Follow up with PCP in 1-2 weeks Please obtain BMP/CBC in one week Please follow up WITH palliative care as outpatient.   Discharge Condition:stable.  CODE STATUS:DNR Diet recommendation: REGULAR DIET.   Brief/Interim Summary: 86 year old F with PMH of DM-2, PAD, CKD-3 and possible cognitive impairment brought to ED after found down at home for unknown period of time and found to be hypothermic with core temp to 94.6 F and admitted for hypothermia and acute metabolic encephalopathy.  Last time seen well on 04/15/2021.  Has mildly elevated CK to 351.  CTh, CT cervical spine and multiple x-rays without significant finding.  Discharge Diagnoses:  Principal Problem:   Hypothermia due to exposure Active Problems:   Type 2 diabetes mellitus with vascular disease (HCC)   Pressure ulcer, shoulder blades, stage II (HCC) - right posterior shoulder   Pressure ulcer, hip, right, unstageable (HCC)   CKD (chronic kidney disease) stage 3, GFR 30-59 ml/min (HCC)   Acute metabolic encephalopathy   Malnutrition of moderate degree  Hypothermia: Resolved Acute metabolic encephalopathy with underlying cognitive impairment , currently low-grade temperatures probably secondary to UTI. Resolved.  Urine analysis abnormal, cultures growing multiple species. completed the rocephin.  Blood cultures showing staph capitis in the aerobic bottle possibly a contaminant, will repeat blood cultures . UDS is negative Vitamin B12 is elevated TSH within normal limits RPR is nonreactive Ammonia level within normal limits     Dehydration/mild rhabdomyolysis Improved. Liberalize the diet.      Mild anemia of chronic disease Continue to monitor.     Type 2  diabetes mellitus Last A1c is 5.6 Diet controlled.      Right posterior shoulder and right hip deep tissue pressure injury present on admission -  Right hip 9 x 8 and is purple/maroon. Right shoulder is 5 x 1.5 pink and tan -  Foam dressing over both wounds placed by wound care -  Turn the patient every 2 hours.   Deconditioning and debility:  Therapy eval recommending SNF.  TOC consult in place.   Discharge Instructions  Discharge Instructions     Diet - low sodium heart healthy   Complete by: As directed    Discharge wound care:   Complete by: As directed    Place a foam dressing over the right shoulder and right hip wound. Change every 3 days or PRN soiling.   Increase activity slowly   Complete by: As directed       Allergies as of 04/21/2021       Reactions   Glipizide Rash        Medication List     TAKE these medications    aspirin 81 MG tablet Take 81 mg by mouth daily.   diltiazem 240 MG 24 hr capsule Commonly known as: CARDIZEM CD Take 1 capsule (240 mg total) by mouth daily.   feeding supplement Liqd Take 237 mLs by mouth 2 (two) times daily between meals.   hydrALAZINE 25 MG tablet Commonly known as: APRESOLINE Take 1 tablet (25 mg total) by mouth every 8 (eight) hours.   hydrochlorothiazide 25 MG tablet Commonly known as: HYDRODIURIL Take 25 mg by mouth daily.   metFORMIN 500 MG tablet Commonly known as: GLUCOPHAGE Take 500 mg by mouth 2 (two) times daily  with a meal.   Multivitamin Adults Tabs Take 1 tablet by mouth daily.   senna 8.6 MG Tabs tablet Commonly known as: SENOKOT Take 1 tablet (8.6 mg total) by mouth 2 (two) times daily.               Discharge Care Instructions  (From admission, onward)           Start     Ordered   04/21/21 0000  Discharge wound care:       Comments: Place a foam dressing over the right shoulder and right hip wound. Change every 3 days or PRN soiling.   04/21/21 1430             Follow-up Information     Kelton Pillar, MD. Schedule an appointment as soon as possible for a visit in 1 week(s).   Specialty: Family Medicine Contact information: [address] [place] [address] [phone]                Allergies  Allergen Reactions   Glipizide Rash    Consultations: Palliative care    Procedures/Studies: DG Ribs Unilateral W/Chest Right  Result Date: 04/17/2021 CLINICAL DATA:  Unwitnessed fall. EXAM: RIGHT RIBS AND CHEST - 3+ VIEW COMPARISON:  None. FINDINGS: No fracture or other bone lesions are seen involving the ribs. Degenerative changes are seen throughout the thoracic spine. There is no evidence of pneumothorax or pleural effusion. Both lungs are clear. Heart size and mediastinal contours are within normal limits. IMPRESSION: No acute osseous abnormality. Electronically Signed   By: Virgina Norfolk M.D.   On: 04/17/2021 23:30   DG Shoulder Right  Result Date: 04/17/2021 CLINICAL DATA:  Unwitnessed fall. EXAM: RIGHT SHOULDER - 2+ VIEW COMPARISON:  None. FINDINGS: There is no evidence of an acute fracture or dislocation. Chronic deformities are seen involving the inferolateral aspect of the right humeral head. A chronic deformity of the greater tubercle is also seen seen. Moderate to marked severity degenerative changes are seen involving the right acromioclavicular joint and right glenohumeral articulation. Soft tissues are unremarkable. IMPRESSION: 1. Degenerative changes without evidence of an acute fracture or dislocation. 2. Chronic deformities of the right humeral head and right greater tubercle. Electronically Signed   By: Virgina Norfolk M.D.   On: 04/17/2021 23:31   CT Head Wo Contrast  Result Date: 04/17/2021 CLINICAL DATA:  Polytrauma. EXAM: CT HEAD WITHOUT CONTRAST CT CERVICAL SPINE WITHOUT CONTRAST TECHNIQUE: Multidetector CT imaging of the head and cervical spine was performed following the standard protocol  without intravenous contrast. Multiplanar CT image reconstructions of the cervical spine were also generated. COMPARISON:  None. FINDINGS: CT HEAD FINDINGS Brain: No evidence of acute infarction, hemorrhage, hydrocephalus, extra-axial collection or mass lesion/mass effect. There is mild periventricular white matter hypodensity, likely chronic small vessel ischemic change. There is mild diffuse atrophy. Vascular: Atherosclerotic calcifications are present within the cavernous internal carotid arteries. Skull: Normal. Negative for fracture or focal lesion. Sinuses/Orbits: No acute finding. Other: There is right facial and right frontal soft tissue swelling. There are punctate skin densities, likely calcifications of the face bilaterally. CT CERVICAL SPINE FINDINGS Alignment: Normal. Skull base and vertebrae: No acute fracture. No primary bone lesion or focal pathologic process. Soft tissues and spinal canal: No prevertebral fluid or swelling. No visible canal hematoma. Disc levels: There is disc space narrowing and endplate osteophyte formation at C5-C6 and C7-T1 compatible with degenerative change. There is scattered mild neural foraminal stenosis bilaterally.  Disc bulge at C7-T1 causes moderate central canal stenosis. Upper chest: There is a 3 mm nodule in the left upper lobe image 5/90. Other: There is a heavily calcified left thyroid nodule measuring 9 mm. There is a 5 mm hypodense left thyroid nodule. IMPRESSION: 1.  No acute intracranial process. 2. No acute fracture or traumatic subluxation of the cervical spine. 3. 3 mm nodule in the left upper lobe. If patient is at low risk for bronchus carcinoma no follow-up is necessary. The patient is at high risk a follow-up CT is recommended in 12 months to reassess. Electronically Signed   By: Ronney Asters M.D.   On: 04/17/2021 22:52   CT Cervical Spine Wo Contrast  Result Date: 04/17/2021 CLINICAL DATA:  Polytrauma. EXAM: CT HEAD WITHOUT CONTRAST CT CERVICAL  SPINE WITHOUT CONTRAST TECHNIQUE: Multidetector CT imaging of the head and cervical spine was performed following the standard protocol without intravenous contrast. Multiplanar CT image reconstructions of the cervical spine were also generated. COMPARISON:  None. FINDINGS: CT HEAD FINDINGS Brain: No evidence of acute infarction, hemorrhage, hydrocephalus, extra-axial collection or mass lesion/mass effect. There is mild periventricular white matter hypodensity, likely chronic small vessel ischemic change. There is mild diffuse atrophy. Vascular: Atherosclerotic calcifications are present within the cavernous internal carotid arteries. Skull: Normal. Negative for fracture or focal lesion. Sinuses/Orbits: No acute finding. Other: There is right facial and right frontal soft tissue swelling. There are punctate skin densities, likely calcifications of the face bilaterally. CT CERVICAL SPINE FINDINGS Alignment: Normal. Skull base and vertebrae: No acute fracture. No primary bone lesion or focal pathologic process. Soft tissues and spinal canal: No prevertebral fluid or swelling. No visible canal hematoma. Disc levels: There is disc space narrowing and endplate osteophyte formation at C5-C6 and C7-T1 compatible with degenerative change. There is scattered mild neural foraminal stenosis bilaterally. Disc bulge at C7-T1 causes moderate central canal stenosis. Upper chest: There is a 3 mm nodule in the left upper lobe image 5/90. Other: There is a heavily calcified left thyroid nodule measuring 9 mm. There is a 5 mm hypodense left thyroid nodule. IMPRESSION: 1.  No acute intracranial process. 2. No acute fracture or traumatic subluxation of the cervical spine. 3. 3 mm nodule in the left upper lobe. If patient is at low risk for bronchus carcinoma no follow-up is necessary. The patient is at high risk a follow-up CT is recommended in 12 months to reassess. Electronically Signed   By: Ronney Asters M.D.   On: 04/17/2021 22:52    MR BRAIN WO CONTRAST  Result Date: 04/19/2021 CLINICAL DATA:  Mental status change, recent trauma EXAM: MRI HEAD WITHOUT CONTRAST TECHNIQUE: Multiplanar, multiecho pulse sequences of the brain and surrounding structures were obtained without intravenous contrast. COMPARISON:  CT head 04/17/2021, no prior MRI FINDINGS: Brain: No acute infarction, hemorrhage, hydrocephalus, extra-axial collection, or mass lesion. The ventricles and sulci are normal for age. Confluent T2 hyperintense signal in the periventricular white matter, likely the sequela of chronic small vessel ischemic disease. Punctate focus of susceptibility in the right cerebellar hemisphere, possibly the sequela of a punctate microhemorrhage. Additional susceptibility along the falx, which correlates with calcifications seen on the 04/17/2021 CT. Remote lacunar infarct in right basal ganglia. Vascular: Normal flow voids. Skull and upper cervical spine: Normal marrow signal. Sinuses/Orbits: Negative.  Status post bilateral lens replacements. Other: The mastoids are well aerated. IMPRESSION: No acute osseous abnormality. No etiology is seen for the patient's mental status change. Electronically Signed  By: Merilyn Baba M.D.   On: 04/19/2021 16:04   DG Pelvis Portable  Result Date: 04/17/2021 CLINICAL DATA:  Unwitnessed fall. EXAM: PORTABLE PELVIS 1-2 VIEWS COMPARISON:  None. FINDINGS: There is no evidence of an acute pelvic fracture or diastasis. Marked severity degenerative changes are seen involving both hips, in the form of joint space narrowing and acetabular sclerosis. A large amount of stool is seen throughout the colon. IMPRESSION: 1. No acute fracture or diastasis. 2. Marked severity degenerative changes of both hips. Electronically Signed   By: Virgina Norfolk M.D.   On: 04/17/2021 23:25   DG Knee Complete 4 Views Left  Result Date: 04/17/2021 CLINICAL DATA:  Unwitnessed fall. EXAM: LEFT KNEE - COMPLETE 4+ VIEW COMPARISON:  None.  FINDINGS: No evidence of an acute fracture or dislocation. Marked severity narrowing of the medial and lateral tibiofemoral compartment spaces is seen. Marked severity patellofemoral narrowing is also noted. Marked severity vascular calcification is seen. There is a small joint effusion. IMPRESSION: 1. No evidence of acute fracture or dislocation. 2. Severe tricompartmental degenerative changes. 3. Small joint effusion. Electronically Signed   By: Virgina Norfolk M.D.   On: 04/17/2021 23:28   DG Knee Complete 4 Views Right  Result Date: 04/17/2021 CLINICAL DATA:  Unwitnessed fall. EXAM: RIGHT KNEE - COMPLETE 4+ VIEW COMPARISON:  None. FINDINGS: No evidence of an acute fracture. Mild chronic appearing lateral subluxation of the right tibia is noted with respect to the distal right femur. Marked severity medial and lateral tibiofemoral compartment space narrowing is seen. Marked severity patellofemoral narrowing is also noted. There is moderate to marked severity vascular calcification. A very small joint effusion is suspected. IMPRESSION: 1. No evidence of acute osseous abnormality. 2. Severe tricompartmental degenerative changes. 3. Very small joint effusion. Electronically Signed   By: Virgina Norfolk M.D.   On: 04/17/2021 23:29   DG Humerus Right  Result Date: 04/17/2021 CLINICAL DATA:  Unwitnessed fall. EXAM: RIGHT HUMERUS - 2+ VIEW COMPARISON:  None. FINDINGS: There is no evidence of acute fracture. Mild superior subluxation of the right humeral head is seen with respect to the right glenoid. Mild to moderate severity degenerative changes are also seen involving the right shoulder and right acromioclavicular joint. Soft tissues are unremarkable. IMPRESSION: 1. No acute fracture. 2. Mild superior subluxation of the right humeral head which may be chronic in nature. 3. Mild to moderate severity degenerative changes. Electronically Signed   By: Virgina Norfolk M.D.   On: 04/17/2021 23:27   VAS [place]  [name] WITH/WO TBI  Result Date: 04/20/2021  LOWER EXTREMITY DOPPLER STUDY Patient Name:  AYBREE LANYON  Date of Exam:   04/20/2021 Medical Rec #: [id number]     Accession #:    [id number] Date of Birth: 1935/02/06     Patient Gender: F Patient Age:   42 years Exam Location:  [facility] Procedure:      VAS [place] [name] WITH/WO TBI Referring Phys: TAYE GONFA --------------------------------------------------------------------------------  Indications: Peripheral artery disease.  Limitations: Today's exam was limited due to bandages and patient positioning. Comparison Study: no prior Performing Technologist: Archie Patten RVS  Examination Guidelines: A complete evaluation includes at minimum, Doppler waveform signals and systolic blood pressure reading at the level of bilateral brachial, anterior tibial, and posterior tibial arteries, when vessel segments are accessible. Bilateral testing is considered an integral part of a complete examination. Photoelectric Plethysmograph (PPG) waveforms and toe systolic pressure readings are included as required and additional duplex testing  as needed. Limited examinations for reoccurring indications may be performed as noted.  [name] Findings: +--------+------------------+-----+---------+--------+ Right   Rt Pressure (mmHg)IndexWaveform Comment  +--------+------------------+-----+---------+--------+ [id number]                    triphasic         +--------+------------------+-----+---------+--------+ PTA     186               1.15 biphasic          +--------+------------------+-----+---------+--------+ DP      197               1.22 triphasic         +--------+------------------+-----+---------+--------+ +--------+-----------------+-----+--------+------------------------------------+ Left    Lt Pressure      IndexWaveformComment                                      (mmHg)                                                              +--------+-----------------+-----+--------+------------------------------------+ [id number]                                                                +--------+-----------------+-----+--------+------------------------------------+ PTA                                   not audible due to patient                                                 positioning                          +--------+-----------------+-----+--------+------------------------------------+ DP      160              0.99 biphasic                                     +--------+-----------------+-----+--------+------------------------------------+ +-------+-----------+-----------+------------+------------+ [name]/TBIToday's ABIToday's TBIPrevious ABIPrevious TBI +-------+-----------+-----------+------------+------------+ Right  1.22       0.64                                +-------+-----------+-----------+------------+------------+ Left   0.99       0.41                                +-------+-----------+-----------+------------+------------+  Summary: Right: Resting right ankle-brachial index is within normal range. No evidence of significant right lower extremity arterial disease. The right toe-brachial index is abnormal. Left: Resting left ankle-brachial index is within normal range. No evidence  of significant left lower extremity arterial disease. The left toe-brachial index is abnormal.  *See table(s) above for measurements and observations.  Electronically signed by Jamelle Haring on 04/20/2021 at 6:24:19 PM.    Final       Subjective: No new complaints.   Discharge Exam: Vitals:   04/21/21 1000 04/21/21 1207  BP: (!) 157/77 (!) 161/97  Pulse: (!) 51 (!) 56  Resp: 18 19  Temp: 97.9 F (36.6 C) 97.6 F (36.4 C)  SpO2: 100% 99%   Vitals:   04/21/21 0640 04/21/21 0738 04/21/21 1000 04/21/21 1207  BP: (!) 161/82 (!) 157/83 (!) 157/77 (!) 161/97  Pulse: (!) 50 (!) 51 (!) 51 (!) 56   Resp: 20 17 18 19   Temp: 97.9 F (36.6 C) 97.7 F (36.5 C) 97.9 F (36.6 C) 97.6 F (36.4 C)  TempSrc: Oral Oral Oral Oral  SpO2: 99% 97% 100% 99%  Weight: 58.1 kg     Height:        General: Pt is alert, awake, not in acute distress Cardiovascular: RRR, S1/S2 +, no rubs, no gallops Respiratory: CTA bilaterally, no wheezing, no rhonchi Abdominal: Soft, NT, ND, bowel sounds + Extremities: no edema, no cyanosis    The results of significant diagnostics from this hospitalization (including imaging, microbiology, ancillary and laboratory) are listed below for reference.     Microbiology: Recent Results (from the past 240 hour(s))  Blood Culture (routine x 2)     Status: None (Preliminary result)   Collection Time: 04/17/21 10:00 PM   Specimen: BLOOD RIGHT WRIST  Result Value Ref Range Status   Specimen Description BLOOD RIGHT WRIST  Final   Special Requests   Final    BOTTLES DRAWN AEROBIC AND ANAEROBIC Blood Culture adequate volume   Culture   Final    NO GROWTH 4 DAYS Performed at [facility], [address] [place], [place] [address]    Report Status PENDING  Incomplete  Blood Culture (routine x 2)     Status: Abnormal   Collection Time: 04/17/21 10:08 PM   Specimen: BLOOD  Result Value Ref Range Status   Specimen Description BLOOD LEFT ANTECUBITAL  Final   Special Requests   Final    BOTTLES DRAWN AEROBIC AND ANAEROBIC Blood Culture results may not be optimal due to an inadequate volume of blood received in culture bottles   Culture  Setup Time   Final    GRAM POSITIVE COCCI IN CLUSTERS ANAEROBIC BOTTLE ONLY CRITICAL RESULT CALLED TO, READ BACK BY AND VERIFIED WITH: PHARMD LISA CURRAN 04/18/21 @2050  BY JW    Culture (A)  Final    STAPHYLOCOCCUS CAPITIS THE SIGNIFICANCE OF ISOLATING THIS ORGANISM FROM A SINGLE SET OF BLOOD CULTURES WHEN MULTIPLE SETS ARE DRAWN IS UNCERTAIN. PLEASE NOTIFY THE MICROBIOLOGY DEPARTMENT WITHIN ONE WEEK IF SPECIATION AND  SENSITIVITIES ARE REQUIRED. Performed at [facility], [place] [address], [place], [place] [address]    Report Status 04/20/2021 FINAL  Final  Blood Culture ID Panel (Reflexed)     Status: Abnormal   Collection Time: 04/17/21 10:08 PM  Result Value Ref Range Status   Enterococcus faecalis NOT DETECTED NOT DETECTED Final   Enterococcus Faecium NOT DETECTED NOT DETECTED Final   Listeria monocytogenes NOT DETECTED NOT DETECTED Final   Staphylococcus species DETECTED (A) NOT DETECTED Final    Comment: CRITICAL RESULT CALLED TO, READ BACK BY AND VERIFIED WITH: PHARMD LISA CURRAN 04/18/21 @2050  BY JW  Staphylococcus aureus (BCID) NOT DETECTED NOT DETECTED Final   Staphylococcus epidermidis NOT DETECTED NOT DETECTED Final   Staphylococcus lugdunensis NOT DETECTED NOT DETECTED Final   Streptococcus species NOT DETECTED NOT DETECTED Final   Streptococcus agalactiae NOT DETECTED NOT DETECTED Final   Streptococcus pneumoniae NOT DETECTED NOT DETECTED Final   Streptococcus pyogenes NOT DETECTED NOT DETECTED Final   A.calcoaceticus-baumannii NOT DETECTED NOT DETECTED Final   Bacteroides fragilis NOT DETECTED NOT DETECTED Final   Enterobacterales NOT DETECTED NOT DETECTED Final   Enterobacter cloacae complex NOT DETECTED NOT DETECTED Final   Escherichia coli NOT DETECTED NOT DETECTED Final   Klebsiella aerogenes NOT DETECTED NOT DETECTED Final   Klebsiella oxytoca NOT DETECTED NOT DETECTED Final   Klebsiella pneumoniae NOT DETECTED NOT DETECTED Final   Proteus species NOT DETECTED NOT DETECTED Final   Salmonella species NOT DETECTED NOT DETECTED Final   Serratia marcescens NOT DETECTED NOT DETECTED Final   Haemophilus influenzae NOT DETECTED NOT DETECTED Final   Neisseria meningitidis NOT DETECTED NOT DETECTED Final   Pseudomonas aeruginosa NOT DETECTED NOT DETECTED Final   Stenotrophomonas maltophilia NOT DETECTED NOT DETECTED Final   Candida albicans NOT DETECTED NOT DETECTED Final    Candida auris NOT DETECTED NOT DETECTED Final   Candida glabrata NOT DETECTED NOT DETECTED Final   Candida krusei NOT DETECTED NOT DETECTED Final   Candida parapsilosis NOT DETECTED NOT DETECTED Final   Candida tropicalis NOT DETECTED NOT DETECTED Final   Cryptococcus neoformans/gattii NOT DETECTED NOT DETECTED Final    Comment: Performed at [facility], [address], [place], [place] [address]  Resp Panel by RT-PCR (Flu A&B, Covid) Nasopharyngeal Swab     Status: None   Collection Time: 04/17/21 10:10 PM   Specimen: Nasopharyngeal Swab; Nasopharyngeal(NP) swabs in vial transport medium  Result Value Ref Range Status   SARS Coronavirus 2 by RT PCR NEGATIVE NEGATIVE Final    Comment: (NOTE) SARS-CoV-2 target nucleic acids are NOT DETECTED.  The SARS-CoV-2 RNA is generally detectable in upper respiratory specimens during the acute phase of infection. The lowest concentration of SARS-CoV-2 viral copies this assay can detect is 138 copies/mL. A negative result does not preclude SARS-Cov-2 infection and should not be used as the sole basis for treatment or other patient management decisions. A negative result may occur with  improper specimen collection/handling, submission of specimen other than nasopharyngeal swab, presence of viral mutation(s) within the areas targeted by this assay, and inadequate number of viral copies(<138 copies/mL). A negative result must be combined with clinical observations, patient history, and epidemiological information. The expected result is Negative.  Fact Sheet for Patients:  EntrepreneurPulse.com.au  Fact Sheet for Healthcare Providers:  IncredibleEmployment.be  This test is no t yet approved or cleared by the [place] FDA and  has been authorized for detection and/or diagnosis of SARS-CoV-2 by FDA under an Emergency Use Authorization (EUA). This EUA will remain  in effect (meaning this test can be  used) for the duration of the COVID-19 declaration under Section 564(b)(1) of the Act, 21 U.S.C.section 360bbb-3(b)(1), unless the authorization is terminated  or revoked sooner.       Influenza A by PCR NEGATIVE NEGATIVE Final   Influenza B by PCR NEGATIVE NEGATIVE Final    Comment: (NOTE) The Xpert Xpress SARS-CoV-2/FLU/RSV plus assay is intended as an aid in the diagnosis of influenza from Nasopharyngeal swab specimens and should not be used as a sole basis for treatment. Nasal washings and aspirates are  unacceptable for Xpert Xpress SARS-CoV-2/FLU/RSV testing.  Fact Sheet for Patients: EntrepreneurPulse.com.au  Fact Sheet for Healthcare Providers: IncredibleEmployment.be  This test is not yet approved or cleared by the [place] FDA and has been authorized for detection and/or diagnosis of SARS-CoV-2 by FDA under an Emergency Use Authorization (EUA). This EUA will remain in effect (meaning this test can be used) for the duration of the COVID-19 declaration under Section 564(b)(1) of the Act, 21 U.S.C. section 360bbb-3(b)(1), unless the authorization is terminated or revoked.  Performed at [facility], [place] [address], [place], [place] [address]   Urine Culture     Status: Abnormal   Collection Time: 04/18/21  1:12 PM   Specimen: In/Out Cath Urine  Result Value Ref Range Status   Specimen Description IN/OUT CATH URINE  Final   Special Requests   Final    NONE Performed at [facility], [place] [address], [place], [place] [address]    Culture MULTIPLE SPECIES PRESENT, SUGGEST RECOLLECTION (A)  Final   Report Status 04/19/2021 FINAL  Final  Culture, blood (Routine X 2) w Reflex to ID Panel     Status: None (Preliminary result)   Collection Time: 04/19/21  4:47 PM   Specimen: Right Antecubital; Blood  Result Value Ref Range Status   Specimen Description RIGHT ANTECUBITAL  Final   Special Requests   Final    BOTTLES  DRAWN AEROBIC AND ANAEROBIC Blood Culture results may not be optimal due to an excessive volume of blood received in culture bottles   Culture   Final    NO GROWTH 2 DAYS Performed at [place] [address] [place], [place] [address]    Report Status PENDING  Incomplete  Culture, blood (Routine X 2) w Reflex to ID Panel     Status: None (Preliminary result)   Collection Time: 04/19/21  4:57 PM   Specimen: BLOOD LEFT ARM  Result Value Ref Range Status   Specimen Description BLOOD LEFT ARM  Final   Special Requests   Final    BOTTLES DRAWN AEROBIC AND ANAEROBIC Blood Culture adequate volume   Culture   Final    NO GROWTH 2 DAYS Performed at [facility], [place] [address] [place], [place] [address]    Report Status PENDING  Incomplete  Resp Panel by RT-PCR (Flu A&B, Covid) Nasopharyngeal Swab     Status: None   Collection Time: 04/21/21 10:46 AM   Specimen: Nasopharyngeal Swab; Nasopharyngeal(NP) swabs in vial transport medium  Result Value Ref Range Status   SARS Coronavirus 2 by RT PCR NEGATIVE NEGATIVE Final    Comment: (NOTE) SARS-CoV-2 target nucleic acids are NOT DETECTED.  The SARS-CoV-2 RNA is generally detectable in upper respiratory specimens during the acute phase of infection. The lowest concentration of SARS-CoV-2 viral copies this assay can detect is 138 copies/mL. A negative result does not preclude SARS-Cov-2 infection and should not be used as the sole basis for treatment or other patient management decisions. A negative result may occur with  improper specimen collection/handling, submission of specimen other than nasopharyngeal swab, presence of viral mutation(s) within the areas targeted by this assay, and inadequate number of viral copies(<138 copies/mL). A negative result must be combined with clinical observations, patient history, and epidemiological information. The expected result is Negative.  Fact Sheet for Patients:   EntrepreneurPulse.com.au  Fact Sheet for Healthcare Providers:  IncredibleEmployment.be  This test is no t yet approved or cleared by the  [place] [employer] and  has been authorized for detection and/or diagnosis of SARS-CoV-2 by FDA under an Print production planner (EUA). This EUA will remain  in effect (meaning this test can be used) for the duration of the COVID-19 declaration under Section 564(b)(1) of the Act, 21 U.S.C.section 360bbb-3(b)(1), unless the authorization is terminated  or revoked sooner.       Influenza A by PCR NEGATIVE NEGATIVE Final   Influenza B by PCR NEGATIVE NEGATIVE Final    Comment: (NOTE) The Xpert Xpress SARS-CoV-2/FLU/RSV plus assay is intended as an aid in the diagnosis of influenza from Nasopharyngeal swab specimens and should not be used as a sole basis for treatment. Nasal washings and aspirates are unacceptable for Xpert Xpress SARS-CoV-2/FLU/RSV testing.  Fact Sheet for Patients: EntrepreneurPulse.com.au  Fact Sheet for Healthcare Providers: IncredibleEmployment.be  This test is not yet approved or cleared by the [place] FDA and has been authorized for detection and/or diagnosis of SARS-CoV-2 by FDA under an Emergency Use Authorization (EUA). This EUA will remain in effect (meaning this test can be used) for the duration of the COVID-19 declaration under Section 564(b)(1) of the Act, 21 U.S.C. section 360bbb-3(b)(1), unless the authorization is terminated or revoked.  Performed at [facility], [place] [address], [place], [place] [address]      Labs: BNP (last 3 results) No results for input(s): BNP in the last 8760 hours. Basic Metabolic Panel: Recent Labs  Lab 04/18/21 0817 04/18/21 1011 04/19/21 1647 04/20/21 0240 04/21/21 1103  NA 149* 141 138 137 137  K 2.3* 3.8 3.9 3.9 3.9  CL >130* 107 103 105 102  CO2 12* 24 28 26 29   GLUCOSE 38*  101* 138* 110* 170*  BUN 16 29* 25* 27* 22  CREATININE 0.48 1.14* 1.02* 1.13* 0.93  CALCIUM 4.0* 8.5* 8.5* 8.3* 8.7*  MG 0.8* 1.8  --   --   --   PHOS 1.7* 3.5  --   --   --    Liver Function Tests: Recent Labs  Lab 04/17/21 2208 04/18/21 0817 04/18/21 1011  AST 40  --   --   ALT 22  --   --   ALKPHOS 60  --   --   BILITOT 1.3*  --   --   PROT 7.7  --   --   ALBUMIN 2.7* <1.5* 2.4*   No results for input(s): LIPASE, AMYLASE in the last 168 hours. Recent Labs  Lab 04/18/21 1435  AMMONIA 22   CBC: Recent Labs  Lab 04/17/21 2208 04/18/21 0708 04/19/21 1647  WBC 6.7 6.6 5.2  NEUTROABS 5.7 5.6 3.7  HGB 12.0 10.8* 10.3*  HCT 37.8 34.2* 32.3*  MCV 98.7 99.4 98.8  PLT 186 177 153   Cardiac Enzymes: Recent Labs  Lab 04/17/21 2208 04/18/21 0708 04/18/21 0817  CKTOTAL 351* 232 125   BNP: Invalid input(s): POCBNP CBG: Recent Labs  Lab 04/20/21 1114 04/20/21 1622 04/20/21 2119 04/21/21 0624 04/21/21 1116  GLUCAP 74 150* 145* 140* 122*   D-Dimer No results for input(s): DDIMER in the last 72 hours. Hgb A1c No results for input(s): HGBA1C in the last 72 hours. Lipid Profile No results for input(s): CHOL, HDL, LDLCALC, TRIG, CHOLHDL, LDLDIRECT in the last 72 hours. Thyroid function studies No results for input(s): TSH, T4TOTAL, T3FREE, THYROIDAB in the last 72 hours.  Invalid input(s): FREET3 Anemia work up No results for input(s): VITAMINB12, FOLATE, FERRITIN, TIBC, IRON, RETICCTPCT in the last 24  hours. Urinalysis    Component Value Date/Time   COLORURINE YELLOW 04/18/2021 1342   APPEARANCEUR HAZY (A) 04/18/2021 1342   LABSPEC 1.024 04/18/2021 1342   PHURINE 5.0 04/18/2021 1342   GLUCOSEU NEGATIVE 04/18/2021 1342   HGBUR NEGATIVE 04/18/2021 1342   BILIRUBINUR NEGATIVE 04/18/2021 1342   KETONESUR 5 (A) 04/18/2021 1342   PROTEINUR 100 (A) 04/18/2021 1342   NITRITE NEGATIVE 04/18/2021 1342   LEUKOCYTESUR NEGATIVE 04/18/2021 1342   Sepsis  Labs Invalid input(s): PROCALCITONIN,  WBC,  LACTICIDVEN Microbiology Recent Results (from the past 240 hour(s))  Blood Culture (routine x 2)     Status: None (Preliminary result)   Collection Time: 04/17/21 10:00 PM   Specimen: BLOOD RIGHT WRIST  Result Value Ref Range Status   Specimen Description BLOOD RIGHT WRIST  Final   Special Requests   Final    BOTTLES DRAWN AEROBIC AND ANAEROBIC Blood Culture adequate volume   Culture   Final    NO GROWTH 4 DAYS Performed at [facility], [address] [place], [place] [address]    Report Status PENDING  Incomplete  Blood Culture (routine x 2)     Status: Abnormal   Collection Time: 04/17/21 10:08 PM   Specimen: BLOOD  Result Value Ref Range Status   Specimen Description BLOOD LEFT ANTECUBITAL  Final   Special Requests   Final    BOTTLES DRAWN AEROBIC AND ANAEROBIC Blood Culture results may not be optimal due to an inadequate volume of blood received in culture bottles   Culture  Setup Time   Final    GRAM POSITIVE COCCI IN CLUSTERS ANAEROBIC BOTTLE ONLY CRITICAL RESULT CALLED TO, READ BACK BY AND VERIFIED WITH: PHARMD LISA CURRAN 04/18/21 @2050  BY JW    Culture (A)  Final    STAPHYLOCOCCUS CAPITIS THE SIGNIFICANCE OF ISOLATING THIS ORGANISM FROM A SINGLE SET OF BLOOD CULTURES WHEN MULTIPLE SETS ARE DRAWN IS UNCERTAIN. PLEASE NOTIFY THE MICROBIOLOGY DEPARTMENT WITHIN ONE WEEK IF SPECIATION AND SENSITIVITIES ARE REQUIRED. Performed at [facility], [place] [address] [place], [place] [address]    Report Status 04/20/2021 FINAL  Final  Blood Culture ID Panel (Reflexed)     Status: Abnormal   Collection Time: 04/17/21 10:08 PM  Result Value Ref Range Status   Enterococcus faecalis NOT DETECTED NOT DETECTED Final   Enterococcus Faecium NOT DETECTED NOT DETECTED Final   Listeria monocytogenes NOT DETECTED NOT DETECTED Final   Staphylococcus species DETECTED (A) NOT DETECTED Final    Comment: CRITICAL RESULT CALLED TO, READ  BACK BY AND VERIFIED WITH: PHARMD LISA CURRAN 04/18/21 @2050  BY JW    Staphylococcus aureus (BCID) NOT DETECTED NOT DETECTED Final   Staphylococcus epidermidis NOT DETECTED NOT DETECTED Final   Staphylococcus lugdunensis NOT DETECTED NOT DETECTED Final   Streptococcus species NOT DETECTED NOT DETECTED Final   Streptococcus agalactiae NOT DETECTED NOT DETECTED Final   Streptococcus pneumoniae NOT DETECTED NOT DETECTED Final   Streptococcus pyogenes NOT DETECTED NOT DETECTED Final   A.calcoaceticus-baumannii NOT DETECTED NOT DETECTED Final   Bacteroides fragilis NOT DETECTED NOT DETECTED Final   Enterobacterales NOT DETECTED NOT DETECTED Final   Enterobacter cloacae complex NOT DETECTED NOT DETECTED Final   Escherichia coli NOT DETECTED NOT DETECTED Final   Klebsiella aerogenes NOT DETECTED NOT DETECTED Final   Klebsiella oxytoca NOT DETECTED NOT DETECTED Final   Klebsiella pneumoniae NOT DETECTED NOT DETECTED Final   Proteus species NOT DETECTED NOT DETECTED Final   Salmonella species  NOT DETECTED NOT DETECTED Final   Serratia marcescens NOT DETECTED NOT DETECTED Final   Haemophilus influenzae NOT DETECTED NOT DETECTED Final   Neisseria meningitidis NOT DETECTED NOT DETECTED Final   Pseudomonas aeruginosa NOT DETECTED NOT DETECTED Final   Stenotrophomonas maltophilia NOT DETECTED NOT DETECTED Final   Candida albicans NOT DETECTED NOT DETECTED Final   Candida auris NOT DETECTED NOT DETECTED Final   Candida glabrata NOT DETECTED NOT DETECTED Final   Candida krusei NOT DETECTED NOT DETECTED Final   Candida parapsilosis NOT DETECTED NOT DETECTED Final   Candida tropicalis NOT DETECTED NOT DETECTED Final   Cryptococcus neoformans/gattii NOT DETECTED NOT DETECTED Final    Comment: Performed at [facility], [place] [address], [place], [place] [address]  Resp Panel by RT-PCR (Flu A&B, Covid) Nasopharyngeal Swab     Status: None   Collection Time: 04/17/21 10:10 PM   Specimen:  Nasopharyngeal Swab; Nasopharyngeal(NP) swabs in vial transport medium  Result Value Ref Range Status   SARS Coronavirus 2 by RT PCR NEGATIVE NEGATIVE Final    Comment: (NOTE) SARS-CoV-2 target nucleic acids are NOT DETECTED.  The SARS-CoV-2 RNA is generally detectable in upper respiratory specimens during the acute phase of infection. The lowest concentration of SARS-CoV-2 viral copies this assay can detect is 138 copies/mL. A negative result does not preclude SARS-Cov-2 infection and should not be used as the sole basis for treatment or other patient management decisions. A negative result may occur with  improper specimen collection/handling, submission of specimen other than nasopharyngeal swab, presence of viral mutation(s) within the areas targeted by this assay, and inadequate number of viral copies(<138 copies/mL). A negative result must be combined with clinical observations, patient history, and epidemiological information. The expected result is Negative.  Fact Sheet for Patients:  EntrepreneurPulse.com.au  Fact Sheet for Healthcare Providers:  IncredibleEmployment.be  This test is no t yet approved or cleared by the [place] FDA and  has been authorized for detection and/or diagnosis of SARS-CoV-2 by FDA under an Emergency Use Authorization (EUA). This EUA will remain  in effect (meaning this test can be used) for the duration of the COVID-19 declaration under Section 564(b)(1) of the Act, 21 U.S.C.section 360bbb-3(b)(1), unless the authorization is terminated  or revoked sooner.       Influenza A by PCR NEGATIVE NEGATIVE Final   Influenza B by PCR NEGATIVE NEGATIVE Final    Comment: (NOTE) The Xpert Xpress SARS-CoV-2/FLU/RSV plus assay is intended as an aid in the diagnosis of influenza from Nasopharyngeal swab specimens and should not be used as a sole basis for treatment. Nasal washings and aspirates are unacceptable for  Xpert Xpress SARS-CoV-2/FLU/RSV testing.  Fact Sheet for Patients: EntrepreneurPulse.com.au  Fact Sheet for Healthcare Providers: IncredibleEmployment.be  This test is not yet approved or cleared by the [place] FDA and has been authorized for detection and/or diagnosis of SARS-CoV-2 by FDA under an Emergency Use Authorization (EUA). This EUA will remain in effect (meaning this test can be used) for the duration of the COVID-19 declaration under Section 564(b)(1) of the Act, 21 U.S.C. section 360bbb-3(b)(1), unless the authorization is terminated or revoked.  Performed at [facility], [place] [address], [place], [place] [address]   Urine Culture     Status: Abnormal   Collection Time: 04/18/21  1:12 PM   Specimen: In/Out Cath Urine  Result Value Ref Range Status   Specimen Description IN/OUT CATH URINE  Final   Special Requests  Final    NONE Performed at [facility], [place] [address], [place], [place] [address]    Culture MULTIPLE SPECIES PRESENT, SUGGEST RECOLLECTION (A)  Final   Report Status 04/19/2021 FINAL  Final  Culture, blood (Routine X 2) w Reflex to ID Panel     Status: None (Preliminary result)   Collection Time: 04/19/21  4:47 PM   Specimen: Right Antecubital; Blood  Result Value Ref Range Status   Specimen Description RIGHT ANTECUBITAL  Final   Special Requests   Final    BOTTLES DRAWN AEROBIC AND ANAEROBIC Blood Culture results may not be optimal due to an excessive volume of blood received in culture bottles   Culture   Final    NO GROWTH 2 DAYS Performed at [facility] [address], [place], [place] [address]    Report Status PENDING  Incomplete  Culture, blood (Routine X 2) w Reflex to ID Panel     Status: None (Preliminary result)   Collection Time: 04/19/21  4:57 PM   Specimen: BLOOD LEFT ARM  Result Value Ref Range Status   Specimen Description BLOOD LEFT ARM  Final   Special Requests    Final    BOTTLES DRAWN AEROBIC AND ANAEROBIC Blood Culture adequate volume   Culture   Final    NO GROWTH 2 DAYS Performed at [facility], [place] [address] [place], [place] [address]    Report Status PENDING  Incomplete  Resp Panel by RT-PCR (Flu A&B, Covid) Nasopharyngeal Swab     Status: None   Collection Time: 04/21/21 10:46 AM   Specimen: Nasopharyngeal Swab; Nasopharyngeal(NP) swabs in vial transport medium  Result Value Ref Range Status   SARS Coronavirus 2 by RT PCR NEGATIVE NEGATIVE Final    Comment: (NOTE) SARS-CoV-2 target nucleic acids are NOT DETECTED.  The SARS-CoV-2 RNA is generally detectable in upper respiratory specimens during the acute phase of infection. The lowest concentration of SARS-CoV-2 viral copies this assay can detect is 138 copies/mL. A negative result does not preclude SARS-Cov-2 infection and should not be used as the sole basis for treatment or other patient management decisions. A negative result may occur with  improper specimen collection/handling, submission of specimen other than nasopharyngeal swab, presence of viral mutation(s) within the areas targeted by this assay, and inadequate number of viral copies(<138 copies/mL). A negative result must be combined with clinical observations, patient history, and epidemiological information. The expected result is Negative.  Fact Sheet for Patients:  EntrepreneurPulse.com.au  Fact Sheet for Healthcare Providers:  IncredibleEmployment.be  This test is no t yet approved or cleared by the [place] FDA and  has been authorized for detection and/or diagnosis of SARS-CoV-2 by FDA under an Emergency Use Authorization (EUA). This EUA will remain  in effect (meaning this test can be used) for the duration of the COVID-19 declaration under Section 564(b)(1) of the Act, 21 U.S.C.section 360bbb-3(b)(1), unless the authorization is terminated  or revoked sooner.        Influenza A by PCR NEGATIVE NEGATIVE Final   Influenza B by PCR NEGATIVE NEGATIVE Final    Comment: (NOTE) The Xpert Xpress SARS-CoV-2/FLU/RSV plus assay is intended as an aid in the diagnosis of influenza from Nasopharyngeal swab specimens and should not be used as a sole basis for treatment. Nasal washings and aspirates are unacceptable for Xpert Xpress SARS-CoV-2/FLU/RSV testing.  Fact Sheet for Patients: EntrepreneurPulse.com.au  Fact Sheet for Healthcare Providers: IncredibleEmployment.be  This test is  not yet approved or cleared by the [place] and has been authorized for detection and/or diagnosis of SARS-CoV-2 by FDA under an Emergency Use Authorization (EUA). This EUA will remain in effect (meaning this test can be used) for the duration of the COVID-19 declaration under Section 564(b)(1) of the Act, 21 U.S.C. section 360bbb-3(b)(1), unless the authorization is terminated or revoked.  Performed at [facility], [place] [address], [place], [place] [address]      Time coordinating discharge: 42 minutes.   SIGNED:   Hosie Poisson, MD  [facility] 04/21/2021, 2:30 PM

## 2021-04-21 NOTE — Care Management Important Message (Signed)
Important Message  Patient Details  Name: Penny [name] MRN: [id number] Date of Birth: 12-14-1934   Medicare Important Message Given:  Yes     Shelda Altes 04/21/2021, 9:57 AM

## 2021-04-21 NOTE — Plan of Care (Signed)

## 2021-04-21 NOTE — Progress Notes (Signed)
Pt agitated and keeps pulling off her cardiac monitor.

## 2021-04-22 LAB — CULTURE, BLOOD (ROUTINE X 2)
Culture: NO GROWTH
Special Requests: ADEQUATE

## 2021-04-24 DIAGNOSIS — L89890 Pressure ulcer of other site, unstageable: Secondary | ICD-10-CM | POA: Diagnosis not present

## 2021-04-24 DIAGNOSIS — L89210 Pressure ulcer of right hip, unstageable: Secondary | ICD-10-CM | POA: Diagnosis not present

## 2021-04-24 DIAGNOSIS — L89152 Pressure ulcer of sacral region, stage 2: Secondary | ICD-10-CM | POA: Diagnosis not present

## 2021-04-24 LAB — CULTURE, BLOOD (ROUTINE X 2)
Culture: NO GROWTH
Culture: NO GROWTH
Special Requests: ADEQUATE

## 2021-04-28 DIAGNOSIS — G9341 Metabolic encephalopathy: Secondary | ICD-10-CM | POA: Diagnosis not present

## 2021-04-28 DIAGNOSIS — N1832 Chronic kidney disease, stage 3b: Secondary | ICD-10-CM | POA: Diagnosis not present

## 2021-04-28 DIAGNOSIS — E1122 Type 2 diabetes mellitus with diabetic chronic kidney disease: Secondary | ICD-10-CM | POA: Diagnosis not present

## 2021-05-01 DIAGNOSIS — L89210 Pressure ulcer of right hip, unstageable: Secondary | ICD-10-CM | POA: Diagnosis not present

## 2021-05-01 DIAGNOSIS — L89890 Pressure ulcer of other site, unstageable: Secondary | ICD-10-CM | POA: Diagnosis not present

## 2021-05-01 DIAGNOSIS — L89152 Pressure ulcer of sacral region, stage 2: Secondary | ICD-10-CM | POA: Diagnosis not present

## 2021-05-08 ENCOUNTER — Inpatient Hospital Stay (HOSPITAL_COMMUNITY)
Admission: EM | Admit: 2021-05-08 | Discharge: 2021-05-16 | DRG: 579 | Disposition: A | Discharge status: Skilled Nursing Facility | Payer: Medicare Other | Attending: Family Medicine | Admitting: Family Medicine

## 2021-05-08 ENCOUNTER — Encounter (HOSPITAL_COMMUNITY): Payer: Self-pay | Admitting: Internal Medicine

## 2021-05-08 ENCOUNTER — Inpatient Hospital Stay (HOSPITAL_COMMUNITY): Payer: Medicare Other

## 2021-05-08 ENCOUNTER — Emergency Department (HOSPITAL_COMMUNITY): Payer: Medicare Other

## 2021-05-08 ENCOUNTER — Ambulatory Visit: Payer: Medicare Other | Admitting: Podiatry

## 2021-05-08 ENCOUNTER — Other Ambulatory Visit: Payer: Self-pay

## 2021-05-08 DIAGNOSIS — R509 Fever, unspecified: Secondary | ICD-10-CM | POA: Diagnosis not present

## 2021-05-08 DIAGNOSIS — R001 Bradycardia, unspecified: Secondary | ICD-10-CM | POA: Diagnosis present

## 2021-05-08 DIAGNOSIS — D649 Anemia, unspecified: Secondary | ICD-10-CM | POA: Diagnosis not present

## 2021-05-08 DIAGNOSIS — E1169 Type 2 diabetes mellitus with other specified complication: Secondary | ICD-10-CM | POA: Diagnosis not present

## 2021-05-08 DIAGNOSIS — N1831 Chronic kidney disease, stage 3a: Secondary | ICD-10-CM | POA: Diagnosis not present

## 2021-05-08 DIAGNOSIS — L03115 Cellulitis of right lower limb: Secondary | ICD-10-CM | POA: Diagnosis not present

## 2021-05-08 DIAGNOSIS — Z7401 Bed confinement status: Secondary | ICD-10-CM | POA: Diagnosis not present

## 2021-05-08 DIAGNOSIS — F0390 Unspecified dementia without behavioral disturbance: Secondary | ICD-10-CM | POA: Diagnosis present

## 2021-05-08 DIAGNOSIS — D509 Iron deficiency anemia, unspecified: Secondary | ICD-10-CM | POA: Diagnosis not present

## 2021-05-08 DIAGNOSIS — Z66 Do not resuscitate: Secondary | ICD-10-CM | POA: Diagnosis not present

## 2021-05-08 DIAGNOSIS — N183 Chronic kidney disease, stage 3 unspecified: Secondary | ICD-10-CM | POA: Diagnosis not present

## 2021-05-08 DIAGNOSIS — R7989 Other specified abnormal findings of blood chemistry: Secondary | ICD-10-CM | POA: Diagnosis present

## 2021-05-08 DIAGNOSIS — D631 Anemia in chronic kidney disease: Secondary | ICD-10-CM | POA: Diagnosis present

## 2021-05-08 DIAGNOSIS — L0231 Cutaneous abscess of buttock: Secondary | ICD-10-CM | POA: Diagnosis not present

## 2021-05-08 DIAGNOSIS — D696 Thrombocytopenia, unspecified: Secondary | ICD-10-CM | POA: Diagnosis not present

## 2021-05-08 DIAGNOSIS — N19 Unspecified kidney failure: Secondary | ICD-10-CM | POA: Diagnosis present

## 2021-05-08 DIAGNOSIS — E1122 Type 2 diabetes mellitus with diabetic chronic kidney disease: Secondary | ICD-10-CM | POA: Diagnosis present

## 2021-05-08 DIAGNOSIS — E860 Dehydration: Secondary | ICD-10-CM | POA: Diagnosis not present

## 2021-05-08 DIAGNOSIS — I129 Hypertensive chronic kidney disease with stage 1 through stage 4 chronic kidney disease, or unspecified chronic kidney disease: Secondary | ICD-10-CM | POA: Diagnosis not present

## 2021-05-08 DIAGNOSIS — M6281 Muscle weakness (generalized): Secondary | ICD-10-CM | POA: Diagnosis not present

## 2021-05-08 DIAGNOSIS — L089 Local infection of the skin and subcutaneous tissue, unspecified: Secondary | ICD-10-CM | POA: Diagnosis not present

## 2021-05-08 DIAGNOSIS — E1159 Type 2 diabetes mellitus with other circulatory complications: Secondary | ICD-10-CM | POA: Diagnosis not present

## 2021-05-08 DIAGNOSIS — R410 Disorientation, unspecified: Secondary | ICD-10-CM | POA: Diagnosis not present

## 2021-05-08 DIAGNOSIS — M4802 Spinal stenosis, cervical region: Secondary | ICD-10-CM | POA: Diagnosis present

## 2021-05-08 DIAGNOSIS — Z7984 Long term (current) use of oral hypoglycemic drugs: Secondary | ICD-10-CM

## 2021-05-08 DIAGNOSIS — L8995 Pressure ulcer of unspecified site, unstageable: Secondary | ICD-10-CM | POA: Diagnosis not present

## 2021-05-08 DIAGNOSIS — L98412 Non-pressure chronic ulcer of buttock with fat layer exposed: Secondary | ICD-10-CM | POA: Diagnosis not present

## 2021-05-08 DIAGNOSIS — S43021S Posterior subluxation of right humerus, sequela: Secondary | ICD-10-CM | POA: Diagnosis not present

## 2021-05-08 DIAGNOSIS — E43 Unspecified severe protein-calorie malnutrition: Secondary | ICD-10-CM | POA: Diagnosis not present

## 2021-05-08 DIAGNOSIS — L299 Pruritus, unspecified: Secondary | ICD-10-CM | POA: Diagnosis not present

## 2021-05-08 DIAGNOSIS — L89152 Pressure ulcer of sacral region, stage 2: Secondary | ICD-10-CM | POA: Diagnosis not present

## 2021-05-08 DIAGNOSIS — R404 Transient alteration of awareness: Secondary | ICD-10-CM | POA: Diagnosis not present

## 2021-05-08 DIAGNOSIS — L0390 Cellulitis, unspecified: Secondary | ICD-10-CM | POA: Diagnosis not present

## 2021-05-08 DIAGNOSIS — L89214 Pressure ulcer of right hip, stage 4: Secondary | ICD-10-CM | POA: Diagnosis not present

## 2021-05-08 DIAGNOSIS — Z79899 Other long term (current) drug therapy: Secondary | ICD-10-CM

## 2021-05-08 DIAGNOSIS — B9561 Methicillin susceptible Staphylococcus aureus infection as the cause of diseases classified elsewhere: Secondary | ICD-10-CM | POA: Diagnosis not present

## 2021-05-08 DIAGNOSIS — R600 Localized edema: Secondary | ICD-10-CM | POA: Diagnosis not present

## 2021-05-08 DIAGNOSIS — B966 Bacteroides fragilis [B. fragilis] as the cause of diseases classified elsewhere: Secondary | ICD-10-CM | POA: Diagnosis not present

## 2021-05-08 DIAGNOSIS — Z9181 History of falling: Secondary | ICD-10-CM | POA: Diagnosis not present

## 2021-05-08 DIAGNOSIS — D508 Other iron deficiency anemias: Secondary | ICD-10-CM | POA: Diagnosis not present

## 2021-05-08 DIAGNOSIS — R109 Unspecified abdominal pain: Secondary | ICD-10-CM | POA: Diagnosis not present

## 2021-05-08 DIAGNOSIS — I10 Essential (primary) hypertension: Secondary | ICD-10-CM | POA: Diagnosis not present

## 2021-05-08 DIAGNOSIS — S71001A Unspecified open wound, right hip, initial encounter: Secondary | ICD-10-CM | POA: Diagnosis present

## 2021-05-08 DIAGNOSIS — D539 Nutritional anemia, unspecified: Secondary | ICD-10-CM | POA: Diagnosis not present

## 2021-05-08 DIAGNOSIS — N179 Acute kidney failure, unspecified: Secondary | ICD-10-CM | POA: Diagnosis present

## 2021-05-08 DIAGNOSIS — M160 Bilateral primary osteoarthritis of hip: Secondary | ICD-10-CM | POA: Diagnosis not present

## 2021-05-08 DIAGNOSIS — Z6823 Body mass index (BMI) 23.0-23.9, adult: Secondary | ICD-10-CM

## 2021-05-08 DIAGNOSIS — S31000A Unspecified open wound of lower back and pelvis without penetration into retroperitoneum, initial encounter: Secondary | ICD-10-CM | POA: Diagnosis not present

## 2021-05-08 DIAGNOSIS — M7989 Other specified soft tissue disorders: Secondary | ICD-10-CM | POA: Diagnosis not present

## 2021-05-08 DIAGNOSIS — Z1159 Encounter for screening for other viral diseases: Secondary | ICD-10-CM | POA: Diagnosis not present

## 2021-05-08 DIAGNOSIS — S31809A Unspecified open wound of unspecified buttock, initial encounter: Secondary | ICD-10-CM | POA: Diagnosis not present

## 2021-05-08 DIAGNOSIS — Z5189 Encounter for other specified aftercare: Secondary | ICD-10-CM

## 2021-05-08 DIAGNOSIS — Z87891 Personal history of nicotine dependence: Secondary | ICD-10-CM

## 2021-05-08 DIAGNOSIS — L02415 Cutaneous abscess of right lower limb: Secondary | ICD-10-CM | POA: Diagnosis not present

## 2021-05-08 DIAGNOSIS — Z20822 Contact with and (suspected) exposure to covid-19: Secondary | ICD-10-CM | POA: Diagnosis present

## 2021-05-08 DIAGNOSIS — B952 Enterococcus as the cause of diseases classified elsewhere: Secondary | ICD-10-CM | POA: Diagnosis not present

## 2021-05-08 DIAGNOSIS — Z8673 Personal history of transient ischemic attack (TIA), and cerebral infarction without residual deficits: Secondary | ICD-10-CM | POA: Diagnosis not present

## 2021-05-08 DIAGNOSIS — Z7982 Long term (current) use of aspirin: Secondary | ICD-10-CM

## 2021-05-08 DIAGNOSIS — R140 Abdominal distension (gaseous): Secondary | ICD-10-CM

## 2021-05-08 DIAGNOSIS — L89890 Pressure ulcer of other site, unstageable: Secondary | ICD-10-CM | POA: Diagnosis not present

## 2021-05-08 DIAGNOSIS — L89323 Pressure ulcer of left buttock, stage 3: Secondary | ICD-10-CM | POA: Diagnosis not present

## 2021-05-08 DIAGNOSIS — K3189 Other diseases of stomach and duodenum: Secondary | ICD-10-CM | POA: Diagnosis not present

## 2021-05-08 DIAGNOSIS — L97919 Non-pressure chronic ulcer of unspecified part of right lower leg with unspecified severity: Secondary | ICD-10-CM | POA: Diagnosis not present

## 2021-05-08 DIAGNOSIS — Z794 Long term (current) use of insulin: Secondary | ICD-10-CM | POA: Diagnosis not present

## 2021-05-08 DIAGNOSIS — S71001D Unspecified open wound, right hip, subsequent encounter: Secondary | ICD-10-CM | POA: Diagnosis not present

## 2021-05-08 DIAGNOSIS — Z888 Allergy status to other drugs, medicaments and biological substances status: Secondary | ICD-10-CM

## 2021-05-08 DIAGNOSIS — R4189 Other symptoms and signs involving cognitive functions and awareness: Secondary | ICD-10-CM | POA: Diagnosis present

## 2021-05-08 DIAGNOSIS — Z743 Need for continuous supervision: Secondary | ICD-10-CM | POA: Diagnosis not present

## 2021-05-08 DIAGNOSIS — M170 Bilateral primary osteoarthritis of knee: Secondary | ICD-10-CM | POA: Diagnosis not present

## 2021-05-08 DIAGNOSIS — L98499 Non-pressure chronic ulcer of skin of other sites with unspecified severity: Secondary | ICD-10-CM | POA: Diagnosis not present

## 2021-05-08 DIAGNOSIS — M19011 Primary osteoarthritis, right shoulder: Secondary | ICD-10-CM | POA: Diagnosis not present

## 2021-05-08 DIAGNOSIS — I9581 Postprocedural hypotension: Secondary | ICD-10-CM | POA: Diagnosis not present

## 2021-05-08 DIAGNOSIS — L89210 Pressure ulcer of right hip, unstageable: Secondary | ICD-10-CM | POA: Diagnosis not present

## 2021-05-08 DIAGNOSIS — M5134 Other intervertebral disc degeneration, thoracic region: Secondary | ICD-10-CM | POA: Diagnosis not present

## 2021-05-08 DIAGNOSIS — S71001S Unspecified open wound, right hip, sequela: Secondary | ICD-10-CM | POA: Diagnosis not present

## 2021-05-08 LAB — CBC WITH DIFFERENTIAL/PLATELET
Abs Immature Granulocytes: 0.03 10*3/uL (ref 0.00–0.07)
Basophils Absolute: 0 10*3/uL (ref 0.0–0.1)
Basophils Relative: 0 %
Eosinophils Absolute: 0.2 10*3/uL (ref 0.0–0.5)
Eosinophils Relative: 3 %
HCT: 29.6 % — ABNORMAL LOW (ref 36.0–46.0)
Hemoglobin: 9.2 g/dL — ABNORMAL LOW (ref 12.0–15.0)
Immature Granulocytes: 0 %
Lymphocytes Relative: 6 %
Lymphs Abs: 0.4 10*3/uL — ABNORMAL LOW (ref 0.7–4.0)
MCH: 31.1 pg (ref 26.0–34.0)
MCHC: 31.1 g/dL (ref 30.0–36.0)
MCV: 100 fL (ref 80.0–100.0)
Monocytes Absolute: 0.6 10*3/uL (ref 0.1–1.0)
Monocytes Relative: 8 %
Neutro Abs: 5.9 10*3/uL (ref 1.7–7.7)
Neutrophils Relative %: 83 %
Platelets: 286 10*3/uL (ref 150–400)
RBC: 2.96 MIL/uL — ABNORMAL LOW (ref 3.87–5.11)
RDW: 13.3 % (ref 11.5–15.5)
WBC: 7.2 10*3/uL (ref 4.0–10.5)
nRBC: 0 % (ref 0.0–0.2)

## 2021-05-08 LAB — C-REACTIVE PROTEIN: CRP: 13.8 mg/dL — ABNORMAL HIGH (ref <1.0)

## 2021-05-08 LAB — COMPREHENSIVE METABOLIC PANEL
ALT: 17 U/L (ref 0–44)
AST: 18 U/L (ref 15–41)
Albumin: 2.4 g/dL — ABNORMAL LOW (ref 3.5–5.0)
Alkaline Phosphatase: 54 U/L (ref 38–126)
Anion gap: 7 (ref 5–15)
BUN: 37 mg/dL — ABNORMAL HIGH (ref 8–23)
CO2: 27 mmol/L (ref 22–32)
Calcium: 8.5 mg/dL — ABNORMAL LOW (ref 8.9–10.3)
Chloride: 102 mmol/L (ref 98–111)
Creatinine, Ser: 1.04 mg/dL — ABNORMAL HIGH (ref 0.44–1.00)
GFR, Estimated: 53 mL/min — ABNORMAL LOW (ref >60)
Glucose, Bld: 185 mg/dL — ABNORMAL HIGH (ref 70–99)
Potassium: 4.2 mmol/L (ref 3.5–5.1)
Sodium: 136 mmol/L (ref 135–145)
Total Bilirubin: 0.5 mg/dL (ref 0.3–1.2)
Total Protein: 7.2 g/dL (ref 6.5–8.1)

## 2021-05-08 LAB — RESP PANEL BY RT-PCR (FLU A&B, COVID) ARPGX2
Influenza A by PCR: NEGATIVE
Influenza B by PCR: NEGATIVE
SARS Coronavirus 2 by RT PCR: NEGATIVE

## 2021-05-08 LAB — GLUCOSE, CAPILLARY: Glucose-Capillary: 181 mg/dL — ABNORMAL HIGH (ref 70–99)

## 2021-05-08 LAB — SEDIMENTATION RATE: Sed Rate: 112 mm/hr — ABNORMAL HIGH (ref 0–22)

## 2021-05-08 LAB — MAGNESIUM: Magnesium: 2 mg/dL (ref 1.7–2.4)

## 2021-05-08 IMAGING — MR MR HIP*R* W/O CM
3 series · 35 of 40 positions shown · non-contrast
Comparison: Radiograph [DATE]

CLINICAL DATA: Chronic ulcer to right hip

EXAM:
MR OF THE RIGHT HIP WITHOUT CONTRAST
TECHNIQUE: Multiplanar, multisequence MR imaging was performed. No intravenous
contrast was administered. Examination was terminated after only 3
series were obtained secondary to patient pain.

[Series 4: T1 · coronal · right · 3.0mm · 0.78mm/px · 9 of 26 slices shown]
[im 1/26]
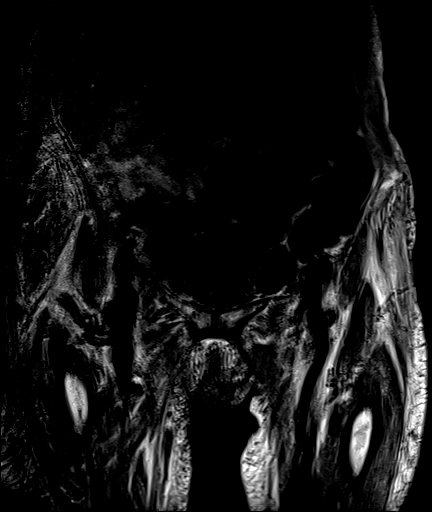
[im 5/26]
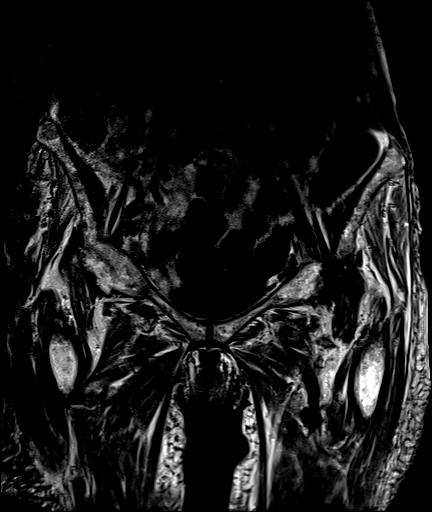
[im 7/26]
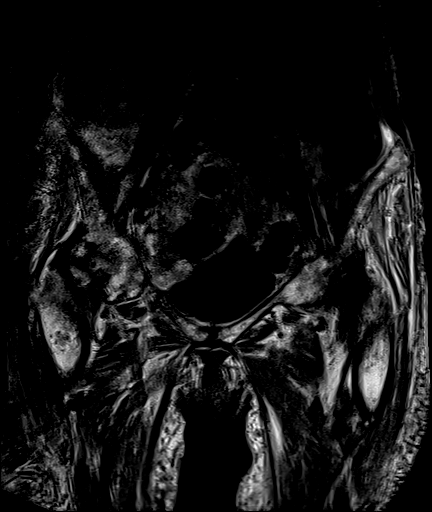
[im 12/26]
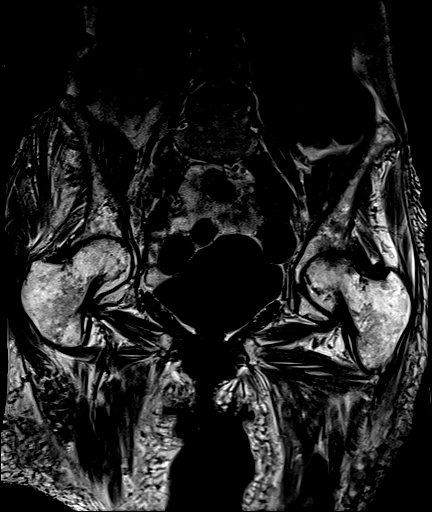
[im 14/26]
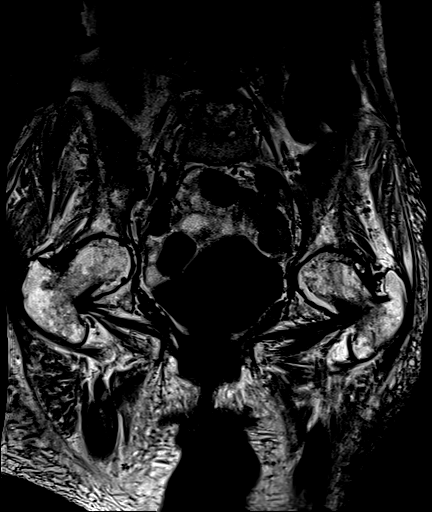
[im 19/26]
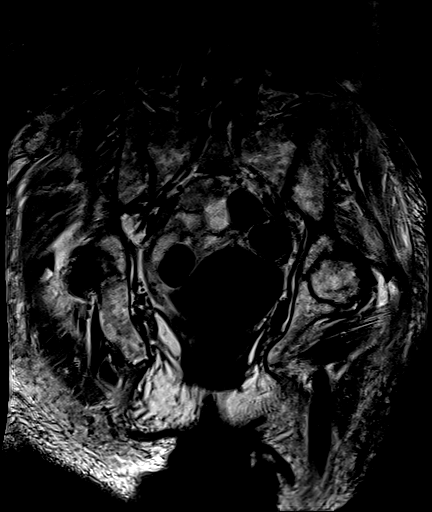
[im 21/26]
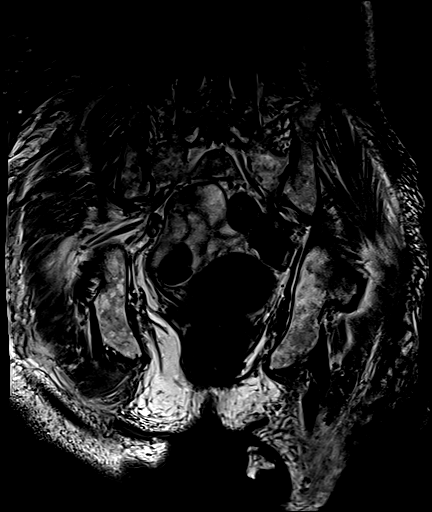
[im 23/26]
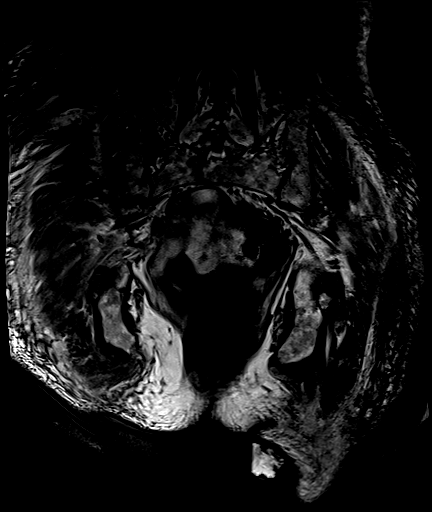
[im 26/26]
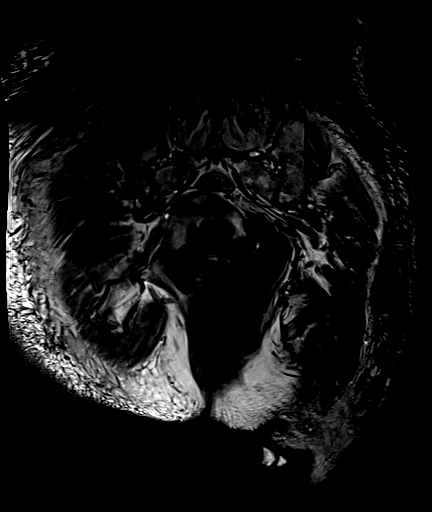

[Series 5: T2 fat-sat · coronal · right · 3.0mm · 1.25mm/px · 11 of 26 slices shown (1 of 2)]
[im 1/26]
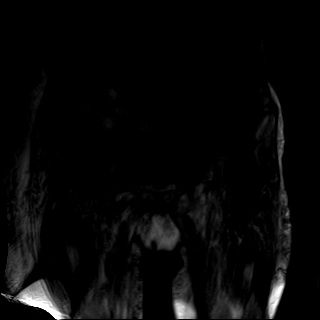
[im 3/26]
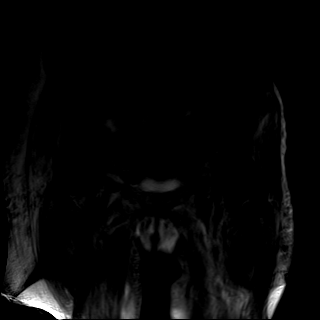
[im 6/26]
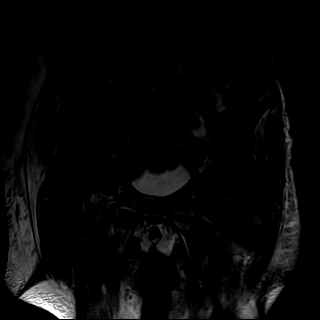
[im 8/26]
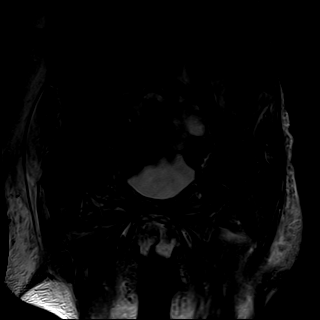
[im 11/26]
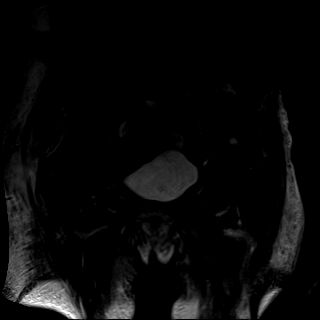
[im 13/26]
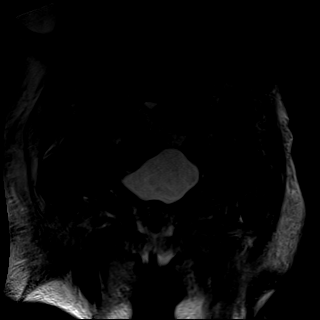
[im 16/26]
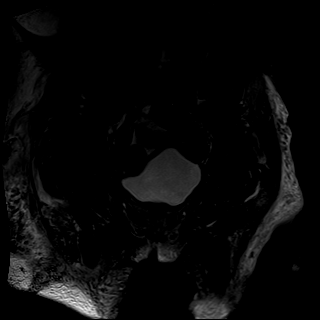
[im 18/26]
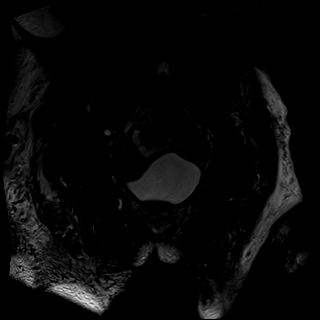
[im 21/26]
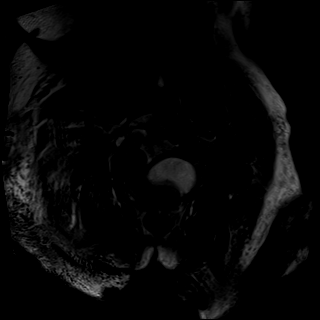
[im 23/26]
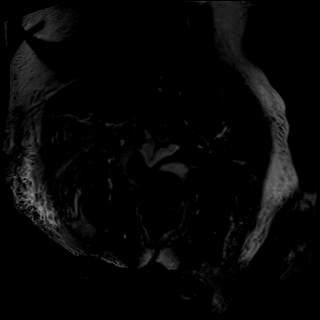
[im 26/26]
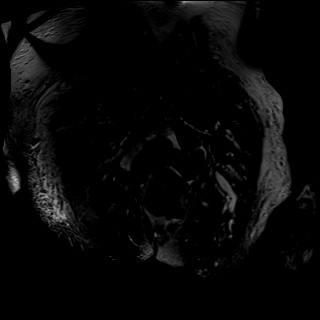

[Series 6: T2 fat-sat · axial · right · 4.0mm · 1.12mm/px · z∈[-102,+77]mm · 15 of 40 slices shown (2 of 2)]
[im 1/40]
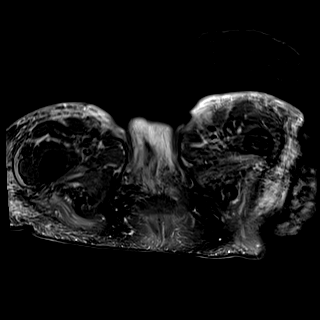
[im 3/40]
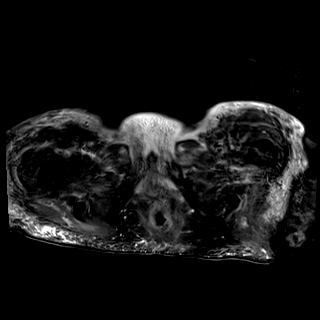
[im 5/40]
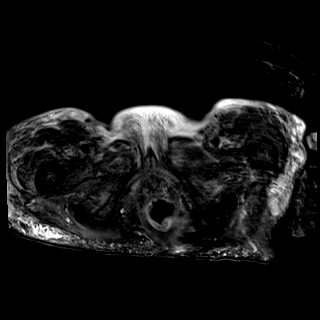
[im 8/40]
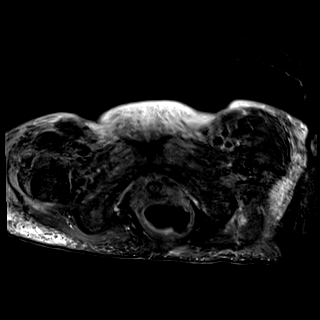
[im 10/40]
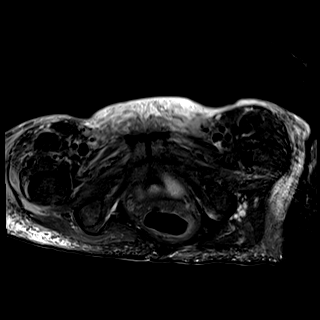
[im 13/40]
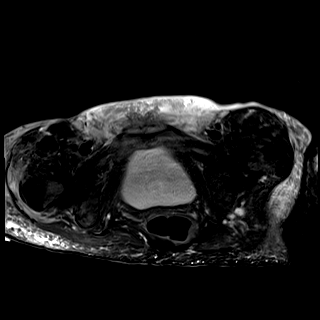
[im 15/40]
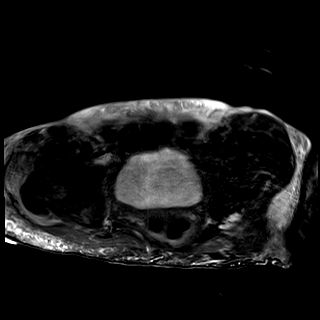
[im 18/40]
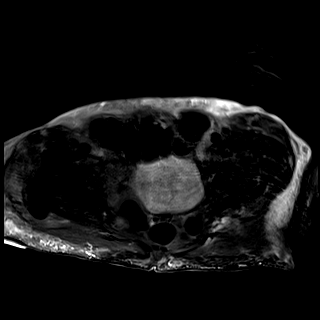
[im 20/40]
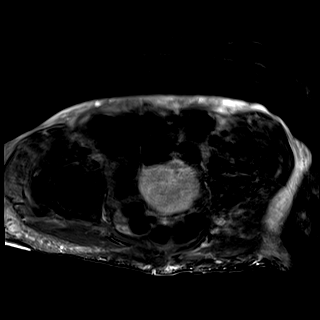
[im 22/40]
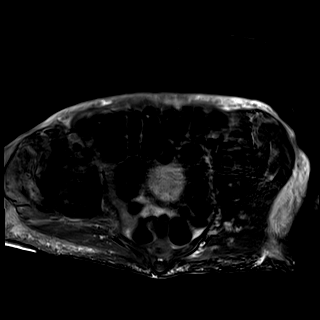
[im 25/40]
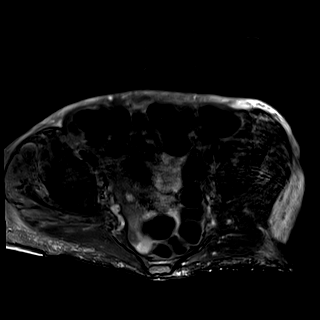
[im 27/40]
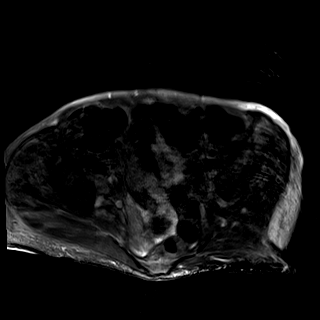
[im 32/40]
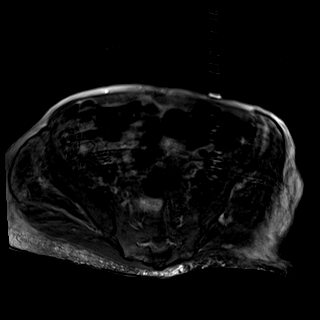
[im 35/40]
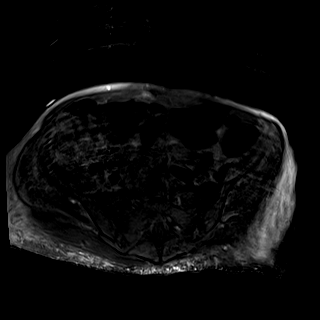
[im 37/40]
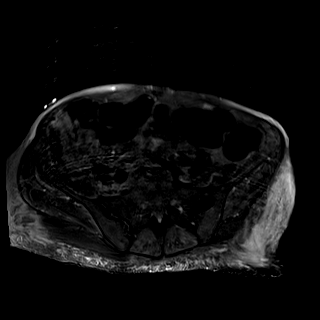

[35 of 40 positions shown; findings below may reference images not displayed]

FINDINGS: All 3 sequences are significantly degraded by patient motion.

Bones: Heterogeneous marrow signal. There may be edema within the
sacrococcygeal region on limited axial views. Small focal area of
edema within the right superior pubic ramus complete loss of the hip
joint space with bone on bone appearance and remodeling of the
femoral heads. Probable left supra-acetabular cyst.

Articular cartilage and labrum

Articular cartilage:  Nondiagnostic

Labrum:  Nondiagnostic

Joint or bursal effusion

Joint effusion:  No gross hip effusions.

Bursae: No gross bursal distension

Muscles and tendons

Muscles and tendons:  Generalized atrophy

Other findings

Miscellaneous: The patient's right hip soft tissue ulcer is only
partially imaged on coronal views. There is a ulcer within the right
lateral hip soft tissues. Abundant subcutaneous edema within the
partially visualized right hip soft tissues laterally. There is
probable edema within the lateral gluteus muscles as well. No gross
focal fluid collection.
IMPRESSION: 1. Nearly nondiagnostic study as only limited sequences were
obtained, all significantly degraded by patient motion. In addition,
the patient's right hip ulcer is incompletely included in the field
of view.
2. Generalized soft tissue edema within the lateral hip soft tissues
with suspected edema in the gluteus muscles. No gross marrow edema
within the underlying femur allowing for excessive motion.
3. Nonspecific foci of suspected edema within the sacrococcygeal
region and right superior pubic ramus.
4. Severe arthritis of the bilateral hips without gross hip
effusion.

## 2021-05-08 IMAGING — CR DG HIP (WITH OR WITHOUT PELVIS) 2-3V*R*
4 series · 4 of 4 positions shown · non-contrast
Comparison: [DATE]

CLINICAL DATA: Pressure ulcer right hip and buttock

EXAM:
DG HIP (WITH OR WITHOUT PELVIS) 2-3V RIGHT

[x pelvis]
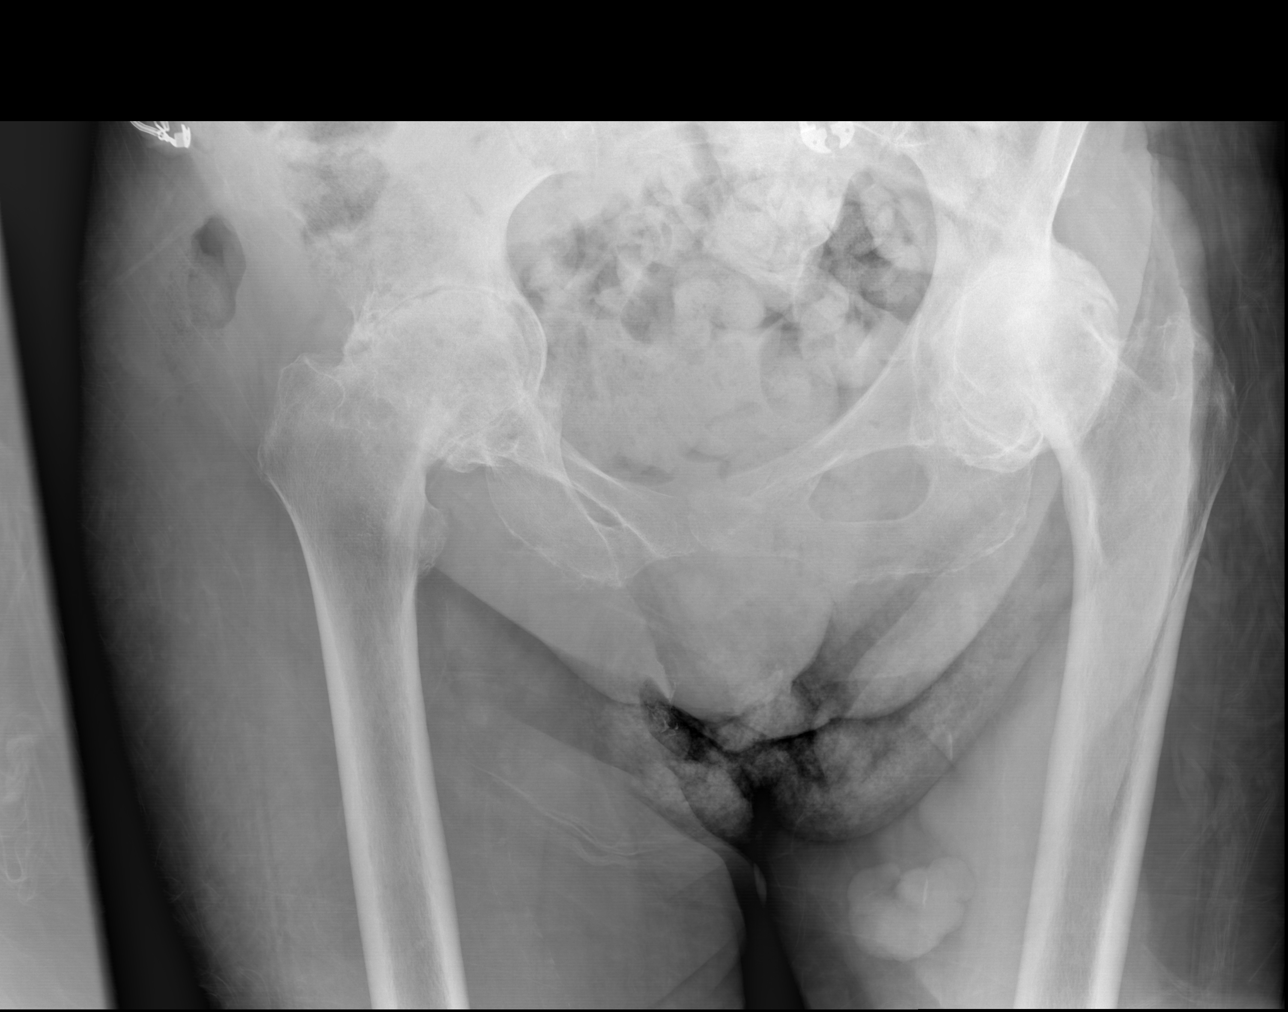

[x hip ap right (1 of 3)]
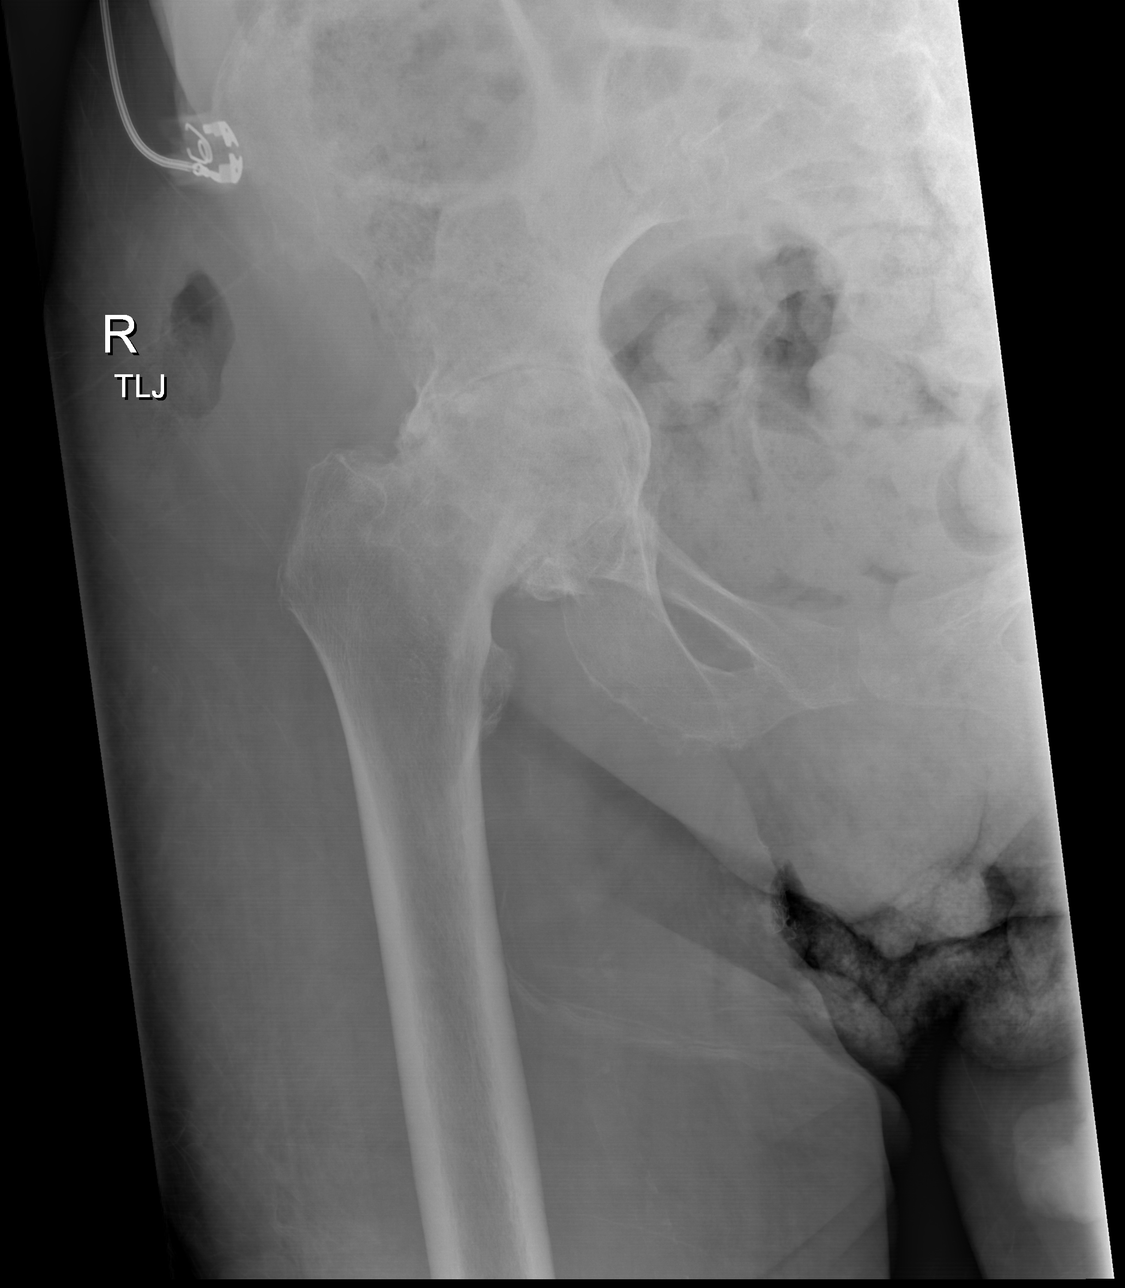

[x hip ap right (2 of 3)]
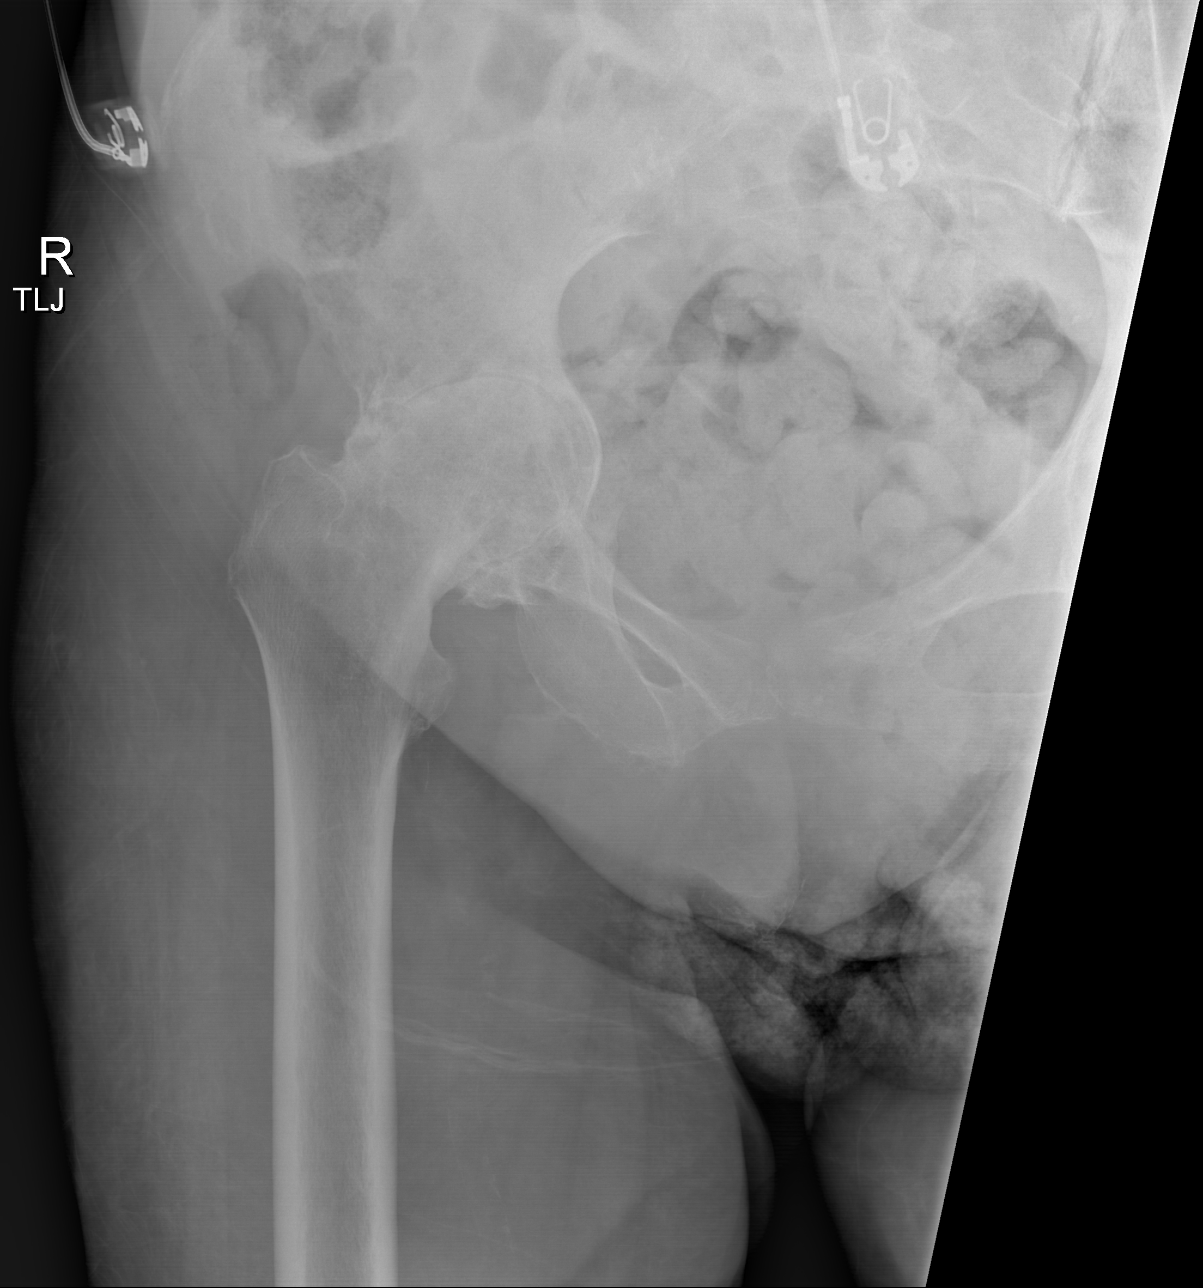

[x hip ap right (3 of 3)]
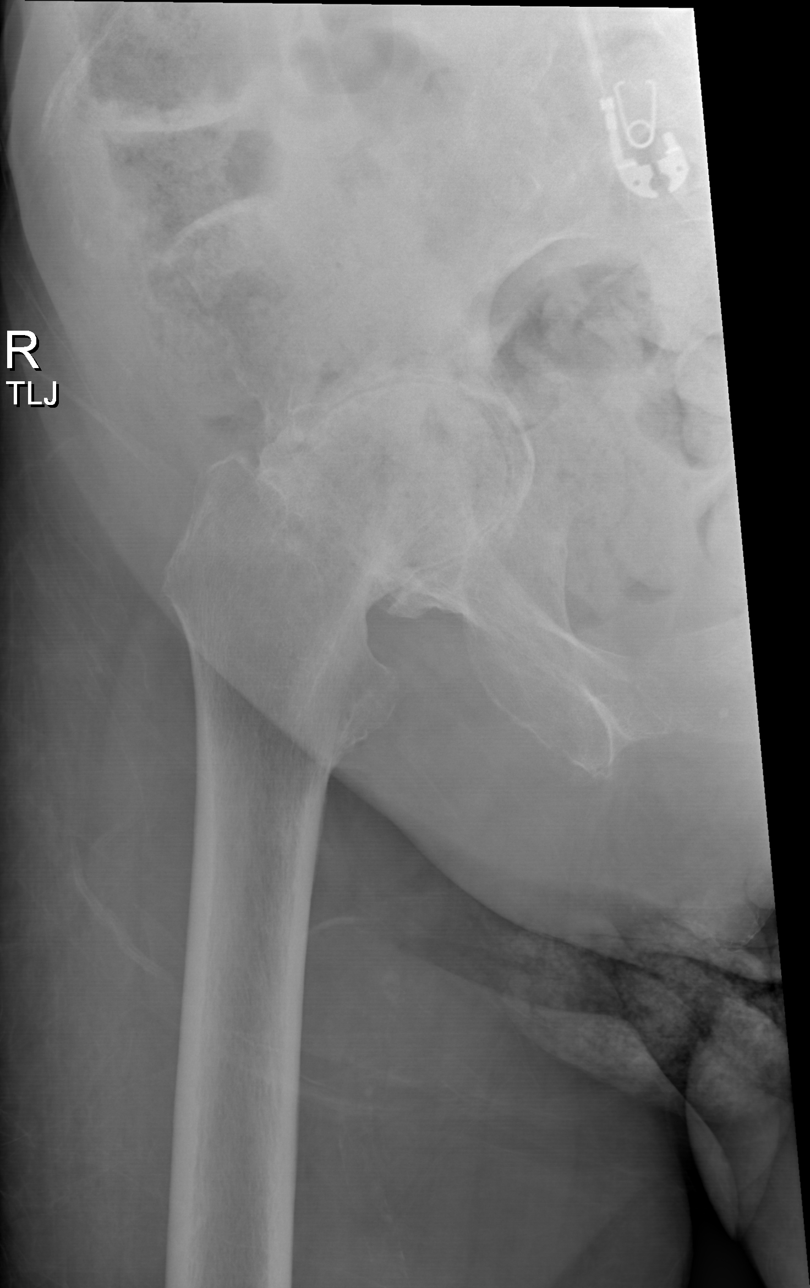

[4 of 4 positions shown; findings below may reference images not displayed]

FINDINGS: No fracture or malalignment. Severe arthritis of the bilateral hips
with protrusio deformity on the right. Bone on bone appearance with
sclerosis. Gas collection within the right hip soft tissues
presumably related to the history of wound or ulcer. Pubic symphysis
and rami appear intact.
IMPRESSION: 1. Gas collection within the right hip soft tissues presumably
related to the history of wound or ulcer. No definite acute osseous
abnormality but would correlate with CT or MRI if continued concern.
2. Severe arthritis of the bilateral hips.

## 2021-05-08 MED ORDER — GADOBUTROL 1 MMOL/ML IV SOLN
7.0000 mL | Freq: Once | INTRAVENOUS | Status: AC | PRN
  Administered 2021-05-08: 7 mL via INTRAVENOUS

## 2021-05-08 MED ORDER — VANCOMYCIN HCL 750 MG/150ML IV SOLN
750.0000 mg | INTRAVENOUS | Status: DC
  Administered 2021-05-09 – 2021-05-10 (×2): 750 mg via INTRAVENOUS
  Filled 2021-05-08 (×3): qty 150

## 2021-05-08 MED ORDER — VANCOMYCIN HCL IN DEXTROSE 1-5 GM/200ML-% IV SOLN
1000.0000 mg | Freq: Once | INTRAVENOUS | Status: AC
  Administered 2021-05-08: 1000 mg via INTRAVENOUS
  Filled 2021-05-08 (×2): qty 200

## 2021-05-08 MED ORDER — PIPERACILLIN-TAZOBACTAM 3.375 G IVPB 30 MIN
3.3750 g | Freq: Once | INTRAVENOUS | Status: DC
  Filled 2021-05-08: qty 50

## 2021-05-08 MED ORDER — INSULIN ASPART 100 UNIT/ML IJ SOLN
0.0000 [IU] | Freq: Three times a day (TID) | INTRAMUSCULAR | Status: DC
  Administered 2021-05-09: 1 [IU] via SUBCUTANEOUS
  Administered 2021-05-09 – 2021-05-10 (×3): 2 [IU] via SUBCUTANEOUS
  Administered 2021-05-11 – 2021-05-13 (×3): 1 [IU] via SUBCUTANEOUS
  Administered 2021-05-13: 2 [IU] via SUBCUTANEOUS
  Administered 2021-05-14: 3 [IU] via SUBCUTANEOUS
  Administered 2021-05-15: 2 [IU] via SUBCUTANEOUS
  Administered 2021-05-15: 1 [IU] via SUBCUTANEOUS
  Administered 2021-05-16: 2 [IU] via SUBCUTANEOUS
  Administered 2021-05-16: 1 [IU] via SUBCUTANEOUS
  Filled 2021-05-08: qty 0.09

## 2021-05-08 MED ORDER — ACETAMINOPHEN 325 MG PO TABS
650.0000 mg | ORAL_TABLET | Freq: Four times a day (QID) | ORAL | Status: DC | PRN
  Administered 2021-05-10 – 2021-05-15 (×5): 650 mg via ORAL
  Filled 2021-05-08 (×5): qty 2

## 2021-05-08 MED ORDER — ACETAMINOPHEN 650 MG RE SUPP: 650.0000 mg | Freq: Four times a day (QID) | RECTAL | Status: DC | PRN

## 2021-05-08 MED ORDER — SODIUM CHLORIDE 0.9 % IV SOLN
2.0000 g | Freq: Two times a day (BID) | INTRAVENOUS | Status: DC
  Administered 2021-05-08 – 2021-05-10 (×5): 2 g via INTRAVENOUS
  Filled 2021-05-08 (×7): qty 2

## 2021-05-08 NOTE — ED Triage Notes (Signed)
Pt. BIB [facility] from snf. Wound care nurse noted a stage 4 pressure injury per EMS and said that she needed to go to the hospital.   EMS VS  CBG:162 BP:116/78 HR:70 RR:14

## 2021-05-08 NOTE — Progress Notes (Signed)
Pharmacy Antibiotic Note  Penny [name] is a 86 y.o. female admitted on 05/08/2021 with R hip wound infection.  Pharmacy has been consulted for vancomycin and cefepime dosing.  Plan: Cefepime 2g IV q12h Vancomycin 1g IV x 1, then 750mg  IV q24h for estimated AUC 475 using SCr 1.04 Check vancomycin levels as needed, goal AUC 400-550 Follow up renal function & cultures  Height: 5\' 5"  (165.1 cm) Weight: 63.6 kg (140 lb 3.2 oz) IBW/kg (Calculated) : 57  Temp (24hrs), Avg:98.2 F (36.8 C), Min:98.2 F (36.8 C), Max:98.2 F (36.8 C)  Recent Labs  Lab 05/08/21 1625  WBC 7.2  CREATININE 1.04*    Estimated Creatinine Clearance: 36.2 mL/min (A) (by C-G formula based on SCr of 1.04 mg/dL (H)).    Allergies  Allergen Reactions   Glipizide Rash    Antimicrobials this admission:  10/24 Vanc >> 10/24 Cefepime >>   Dose adjustments this admission:    Microbiology results:   Thank you for allowing pharmacy to be a part of this patient's care.  Peggyann Juba, PharmD, BCPS Pharmacy: [phone] 05/08/2021 8:07 PM

## 2021-05-08 NOTE — ED Provider Notes (Signed)
[facility] DEPT Provider Note   CSN: [id number] Arrival date & time: 05/08/21  1458     History Chief Complaint  Patient presents with   Wound Check    Penny [name] is a 86 y.o. female.  HPI Patient is an 86 year old female with a history of stage III CKD, pressure ulcer, type 2 diabetes mellitus, who presents to the emergency department from [facility] due to a worsening pressure ulcer to the right hip.  Per EMS, wound care nurse noted that patient has developed a stage IV pressure ulcer in the region and sent her to the emergency department for further evaluation.  Patient reports pain in the region as well as mild drainage from the wound.  No fevers, chills, nausea, or vomiting.    Past Medical History:  Diagnosis Date   CKD (chronic kidney disease) stage 3, GFR 30-59 ml/min (HCC) 04/17/2021    Patient Active Problem List   Diagnosis Date Noted   Malnutrition of moderate degree 26/94/8546   Acute metabolic encephalopathy 27/09/5007   Hypothermia due to exposure 04/17/2021   Pressure ulcer, shoulder blades, stage II ([place]) - right posterior shoulder 04/17/2021   Pressure ulcer, hip, right, unstageable ([place]) 04/17/2021   CKD (chronic kidney disease) stage 3, GFR 30-59 ml/min (HCC) 04/17/2021   Pain due to onychomycosis of toenails of both feet 02/01/2021   Type 2 diabetes mellitus with vascular disease ([place]) 02/01/2021    No past surgical history on file.   OB History   No obstetric history on file.     No family history on file.  Social History   Tobacco Use   Smoking status: Never   Smokeless tobacco: Never  Substance Use Topics   Alcohol use: No   Drug use: No    Home Medications Prior to Admission medications   Medication Sig Start Date End Date Taking? Authorizing Provider  aspirin 81 MG tablet Take 81 mg by mouth daily.    [provider]  diltiazem (CARDIZEM CD) 240 MG 24 hr capsule Take 1  capsule (240 mg total) by mouth daily. 04/21/21   Hosie Poisson, MD  feeding supplement (ENSURE ENLIVE / ENSURE PLUS) LIQD Take 237 mLs by mouth 2 (two) times daily between meals. 04/21/21 07/20/21  Hosie Poisson, MD  hydrALAZINE (APRESOLINE) 25 MG tablet Take 1 tablet (25 mg total) by mouth every 8 (eight) hours. 04/21/21   Hosie Poisson, MD  hydrochlorothiazide (HYDRODIURIL) 25 MG tablet Take 25 mg by mouth daily. 05/15/19   [provider]  metFORMIN (GLUCOPHAGE) 500 MG tablet Take 500 mg by mouth 2 (two) times daily with a meal. 10/01/15   [provider]  Multiple Vitamins-Minerals (MULTIVITAMIN ADULTS) TABS Take 1 tablet by mouth daily.    [provider]  senna (SENOKOT) 8.6 MG TABS tablet Take 1 tablet (8.6 mg total) by mouth 2 (two) times daily. 04/21/21   Hosie Poisson, MD    Allergies    Glipizide  Review of Systems   Review of Systems  All other systems reviewed and are negative. Ten systems reviewed and are negative for acute change, except as noted in the HPI.   Physical Exam Updated Vital Signs BP 135/72   Pulse 67   Temp 98.2 F (36.8 C) (Oral)   Resp 19   Ht _0  (1.651 m)   Wt 12.7 kg   LMP  (LMP Unknown)   SpO2 96%   BMI 4.67 kg/m  Physical Exam Vitals and nursing note reviewed.  Constitutional:      General: She is not in acute distress.    Appearance: Normal appearance. She is not ill-appearing, toxic-appearing or diaphoretic.  HENT:     Head: Normocephalic and atraumatic.     Right Ear: External ear normal.     Left Ear: External ear normal.     Nose: Nose normal.     Mouth/Throat:     Mouth: Mucous membranes are moist.     Pharynx: Oropharynx is clear. No oropharyngeal exudate or posterior oropharyngeal erythema.  Eyes:     General: No scleral icterus.       Right eye: No discharge.        Left eye: No discharge.     Extraocular Movements: Extraocular movements intact.     Conjunctiva/sclera: Conjunctivae normal.   Cardiovascular:     Rate and Rhythm: Normal rate and regular rhythm.     Pulses: Normal pulses.     Heart sounds: Normal heart sounds. No murmur heard.   No friction rub. No gallop.  Pulmonary:     Effort: Pulmonary effort is normal. No respiratory distress.     Breath sounds: Normal breath sounds. No stridor. No wheezing, rhonchi or rales.  Abdominal:     General: Abdomen is flat.     Palpations: Abdomen is soft.     Tenderness: There is no abdominal tenderness.  Musculoskeletal:        General: Normal range of motion.     Cervical back: Normal range of motion and neck supple. No tenderness.  Skin:    General: Skin is warm and dry.     Comments: Please see images below of the right hip/thigh.  Neurological:     General: No focal deficit present.     Mental Status: She is alert and oriented to person, place, and time.  Psychiatric:        Mood and Affect: Mood normal.        Behavior: Behavior normal.    ED Results / Procedures / Treatments   Labs (all labs ordered are listed, but only abnormal results are displayed) Labs Reviewed  COMPREHENSIVE METABOLIC PANEL - Abnormal; Notable for the following components:      Result Value   Glucose, Bld 185 (*)    BUN 37 (*)    Creatinine, Ser 1.04 (*)    Calcium 8.5 (*)    Albumin 2.4 (*)    GFR, Estimated 53 (*)    All other components within normal limits  CBC WITH DIFFERENTIAL/PLATELET - Abnormal; Notable for the following components:   RBC 2.96 (*)    Hemoglobin 9.2 (*)    HCT 29.6 (*)    Lymphs Abs 0.4 (*)    All other components within normal limits  SEDIMENTATION RATE - Abnormal; Notable for the following components:   Sed Rate 112 (*)    All other components within normal limits  C-REACTIVE PROTEIN - Abnormal; Notable for the following components:   CRP 13.8 (*)    All other components within normal limits   EKG None  Radiology DG Hip Unilat W or Wo Pelvis 2-3 Views Right  Result Date: 05/08/2021 CLINICAL  DATA:  Pressure ulcer right hip and buttock EXAM: DG HIP (WITH OR WITHOUT PELVIS) 2-3V RIGHT COMPARISON:  04/17/2021 FINDINGS: No fracture or malalignment. 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 Bone on bone appearance with sclerosis. Gas collection within the  right hip soft tissues presumably related to the history of wound or ulcer. Pubic symphysis and rami appear intact. IMPRESSION: 1. Gas collection within the right hip soft tissues presumably related to the history of wound or ulcer. No definite acute osseous abnormality but would correlate with CT or MRI if continued concern. 2. Severe arthritis of the bilateral hips. Electronically Signed   By: Donavan Foil M.D.   On: 05/08/2021 17:52    Procedures Procedures   Medications Ordered in ED Medications - No data to display  ED Course  I have reviewed the triage vital signs and the nursing notes.  Pertinent labs & imaging results that were available during my care of the patient were reviewed by me and considered in my medical decision making (see chart for details).  Clinical Course as of 05/08/21 1833  Mon May 08, 2021  1828 CRP(!): 13.8 [LJ]  1828 Sed Rate(!): 112 [LJ]    Clinical Course User Index [LJ] Rayna Sexton, PA-C   MDM Rules/Calculators/A&P                          Pt is a 86 y.o. female who presents to the emergency department due to a worsening ulcer to the right hip.  Please see images above.  Patient sent to the emergency department from [facility] health care center.  Per paperwork, wound nurse practitioner drained about 150 to 200 cc of pus from the wound.  They noted bony involvement.  Labs: CBC with RBCs of 2.96 hemoglobin of 9.2, hematocrit of 29.6, lymphocytes of 0.4. CMP with a glucose of 185, BUN of 37, creatinine of 1.04, calcium of 8.5, albumin of 2.4, GFR 53. CRP of 13.8 pain Sed rate of 112.  Imaging: X-ray of the right hip shows IMPRESSION: 1. Gas collection  within the right hip soft tissues presumably related to the history of wound or ulcer. No definite acute osseous abnormality but would correlate with CT or MRI if continued concern. 2. Severe arthritis of the bilateral hips.  MRI of the right hip is pending.  I, Rayna Sexton, PA-C, personally reviewed and evaluated these images and lab results as part of my medical decision-making.  Patient with a chronic, likely stage IV wound to the right hip. Increased warmth with purulent drainage noted from the site.  Elevated ESR and CRP.  MRI pending.  We will start patient on vancomycin as well as Zosyn.  Given the extent of the patient's wound feel that she will likely require admission to medicine.  We will discuss with the medicine team.  Note: Portions of this report may have been transcribed using voice recognition software. Every effort was made to ensure accuracy; however, inadvertent computerized transcription errors may be present.   Final Clinical Impression(s) / ED Diagnoses Final diagnoses:  Pressure injury of right hip, stage 4 [facility])   Rx / DC Orders ED Discharge Orders     None        Rayna Sexton, PA-C 05/08/21 1836    Gareth Morgan, MD 05/08/21 2043

## 2021-05-08 NOTE — Progress Notes (Signed)
A consult was received from an ED physician for vancomycin and zosyn per pharmacy dosing.  The patient's profile has been reviewed for ht/wt/allergies/indication/available labs.   A one time order has been placed for vancomycin 1g and zosyn 3.375g.  Further antibiotics/pharmacy consults should be ordered by admitting physician if indicated.                       Thank you, Peggyann Juba, PharmD, BCPS 05/08/2021  6:43 PM

## 2021-05-08 NOTE — ED Notes (Signed)
Pt transported to MRI 

## 2021-05-08 NOTE — H&P (Signed)
History and Physical    PLEASE NOTE THAT DRAGON DICTATION SOFTWARE WAS USED IN THE CONSTRUCTION OF THIS NOTE.   [name] HQI:[id number] DOB: [birth year] DOA: 05/08/2021  PCP: Kelton Pillar, MD Patient coming from: home   I have personally briefly reviewed patient's old medical records in [place]  Chief Complaint: Purulent drainage from chronic right hip wound  HPI: Penny [name] is a 86 y.o. female with medical history significant for chronic right hip wound, type 2 diabetes mellitus, anemia of chronic disease with baseline hemoglobin 10-12,  who is [facility] to [facility]08/2021 with infected stage IV pressure wound associated with the right hip after presenting to [facility] ED for evaluation purulent drainage from chronic right hip wound.   The patient was undergoing outpatient wound care related to her chronic right hip wound and wound care personnel noted the interval development of purulent drainage from this wound as well as associated new onset surrounding erythema.  The patient reports potential very mild pain associated with this lesion.  Denies any associated subjective fever, chills, rigors, or generalized myalgias.  Denies any new erythema at any other location.Denies any recent headache, neck stiffness, rhinitis, rhinorrhea, sore throat, sob, wheezing, cough, nausea, vomiting, abdominal pain, diarrhea. Denies dysuria, gross hematuria, or change in urinary urgency/frequency.  Denies any recent chest pain, diaphoresis, or palpitations.  Medical history notable for type 2 diabetes mellitus with most recent hemoglobin A1c found to be 5.6% on 04/18/2021.  Non-insulin-dependent, on metformin as result patient oral hypoglycemic agent.    ED Course:  Vital signs in the ED were notable for the following: Temperature max 98.7, heart rate 65-88, blood pressure 135/72 -155/86; respiratory rate 16-24, oxygen saturation 97 to 100% on room air.   Labs were notable for the  following: CMP notable for the following: Bicarbonate 27, BUN 37, creatinine 1.04 relative to most recent prior creatinine data point of 0.93 on 04/21/2021, glucose 185, calcium adjusted for mild hypoalbuminemia 9.7, albumin 2.4, otherwise liver enzymes were found to be within normal limits.  CBC notable for white cell count 7200, hemoglobin 9.2 associated with normocytic/normochromic findings as well as nonelevated RDW and compared to most recent prior hemoglobin data point of 10.3 on 04/19/2021, platelets 286.  CRP 13.8.  ESR 112.  Screening COVID-19/[place] PCR checked in the ED today and found to be negative.  Imaging and additional notable ED work-up: Plain films of the right hip showed no overt evidence of acute osseous abnormality.  EDP has ordered MRI of the right hip to further evaluate the patient's right hip wound, with the scan pending at the present time.  While in the ED, the following were administered: IV vancomycin, cefepime, subsequently, the patient was admitted to the [place] unit for further evaluation and management of infected stage IV pressure wound associated the right hip.     Review of Systems: As per HPI otherwise 10 point review of systems negative.   Past Medical History:  Diagnosis Date   CKD (chronic kidney disease) stage 3, GFR 30-59 ml/min (HCC) 04/17/2021    History reviewed. No pertinent surgical history.  Social History:  reports that she has never smoked. She quit smokeless tobacco use about 65 years ago.  Her smokeless tobacco use included snuff and chew. She reports that she does not drink alcohol and does not use drugs.   Allergies  Allergen Reactions   Glipizide Rash    History reviewed. No pertinent family history.   Prior  to Admission medications   Medication Sig Start Date End Date Taking? Authorizing Provider  aspirin 81 MG tablet Take 81 mg by mouth daily.    [provider]  diltiazem (CARDIZEM CD) 240 MG 24 hr capsule Take 1  capsule (240 mg total) by mouth daily. 04/21/21   Hosie Poisson, MD  feeding supplement (ENSURE ENLIVE / ENSURE PLUS) LIQD Take 237 mLs by mouth 2 (two) times daily between meals. 04/21/21 07/20/21  Hosie Poisson, MD  hydrALAZINE (APRESOLINE) 25 MG tablet Take 1 tablet (25 mg total) by mouth every 8 (eight) hours. 04/21/21   Hosie Poisson, MD  hydrochlorothiazide (HYDRODIURIL) 25 MG tablet Take 25 mg by mouth daily. 05/15/19   [provider]  metFORMIN (GLUCOPHAGE) 500 MG tablet Take 500 mg by mouth 2 (two) times daily with a meal. 10/01/15   [provider]  Multiple Vitamins-Minerals (MULTIVITAMIN ADULTS) TABS Take 1 tablet by mouth daily.    [provider]  senna (SENOKOT) 8.6 MG TABS tablet Take 1 tablet (8.6 mg total) by mouth 2 (two) times daily. 04/21/21   Hosie Poisson, MD     Objective    Physical Exam: Vitals:   05/08/21 1521 05/08/21 1615 05/08/21 1715 05/08/21 1857  BP: (!) 155/86 (!) 145/76 135/72 140/76  Pulse: 70 64 67 78  Resp: _0 (!) 24  Temp: 98.2 F (36.8 C) 98.2 F (36.8 C)    TempSrc: Oral Oral    SpO2: 99% 97% 96% 100%  Weight:  12.7 kg    Height:  5' 5" (1.651 m)      General: appears to be stated age; alert, oriented Skin: warm, dry, no rash Head:  AT/ Mouth:  Oral mucosa membranes appear dry, normal dentition Neck: supple; trachea midline Heart:  RRR; did not appreciate any M/R/G Lungs: CTAB, did not appreciate any wheezes, rales, or rhonchi Abdomen: + BS; soft, ND, NT Vascular: 2+ pedal pulses b/l; 2+ radial pulses b/l Extremities: Stage IV pressure ulcer associated with right hip with associated surrounding erythema, increased warmth to touch, in the absence of crepitus; no peripheral edema,  Neuro: strength and sensation intact in upper and lower extremities b/l     Labs on Admission: I have personally reviewed following labs and imaging studies  CBC: Recent Labs  Lab 05/08/21 1625  WBC 7.2  NEUTROABS 5.9   HGB 9.2*  HCT 29.6*  MCV 100.0  PLT 790   Basic Metabolic Panel: Recent Labs  Lab 05/08/21 1625  NA 136  K 4.2  CL 102  CO2 27  GLUCOSE 185*  BUN 37*  CREATININE 1.04*  CALCIUM 8.5*   GFR: Estimated Creatinine Clearance: 8.1 mL/min (A) (by C-G formula based on SCr of 1.04 mg/dL (H)). Liver Function Tests: Recent Labs  Lab 05/08/21 1625  AST 18  ALT 17  ALKPHOS 54  BILITOT 0.5  PROT 7.2  ALBUMIN 2.4*   No results for input(s): LIPASE, AMYLASE in the last 168 hours. No results for input(s): AMMONIA in the last 168 hours. Coagulation Profile: No results for input(s): INR, PROTIME in the last 168 hours. Cardiac Enzymes: No results for input(s): CKTOTAL, CKMB, CKMBINDEX, TROPONINI in the last 168 hours. BNP (last 3 results) No results for input(s): PROBNP in the last 8760 hours. HbA1C: No results for input(s): HGBA1C in the last 72 hours. CBG: No results for input(s): GLUCAP in the last 168 hours. Lipid Profile: No results for input(s): CHOL, HDL, LDLCALC, TRIG, CHOLHDL, LDLDIRECT in the  last 72 hours. Thyroid Function Tests: No results for input(s): TSH, T4TOTAL, FREET4, T3FREE, THYROIDAB in the last 72 hours. Anemia Panel: No results for input(s): VITAMINB12, FOLATE, FERRITIN, TIBC, IRON, RETICCTPCT in the last 72 hours. Urine analysis:    Component Value Date/Time   COLORURINE YELLOW 04/18/2021 1342   APPEARANCEUR HAZY (A) 04/18/2021 1342   LABSPEC 1.024 04/18/2021 1342   PHURINE 5.0 04/18/2021 1342   GLUCOSEU NEGATIVE 04/18/2021 1342   HGBUR NEGATIVE 04/18/2021 1342   BILIRUBINUR NEGATIVE 04/18/2021 1342   KETONESUR 5 (A) 04/18/2021 1342   PROTEINUR 100 (A) 04/18/2021 1342   NITRITE NEGATIVE 04/18/2021 1342   LEUKOCYTESUR NEGATIVE 04/18/2021 1342    Radiological Exams on Admission: DG Hip Unilat W or Wo Pelvis 2-3 Views Right  Result Date: 05/08/2021 CLINICAL DATA:  Pressure ulcer right hip and buttock EXAM: DG HIP (WITH OR WITHOUT PELVIS) 2-3V  RIGHT COMPARISON:  04/17/2021 FINDINGS: No fracture or malalignment. 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 Bone on bone appearance with sclerosis. Gas collection within the right hip soft tissues presumably related to the history of wound or ulcer. Pubic symphysis and rami appear intact. IMPRESSION: 1. Gas collection within the right hip soft tissues presumably related to the history of wound or ulcer. No definite acute osseous abnormality but would correlate with CT or MRI if continued concern. 2. Severe arthritis of the bilateral hips. Electronically Signed   By: Donavan Foil M.D.   On: 05/08/2021 17:52      Assessment/Plan    Principal Problem:   Complicated open wound of right hip Active Problems:   Type 2 diabetes mellitus with vascular disease (HCC)   Acute prerenal azotemia   Acute on chronic anemia    #) Infected stage IV pressure wound associated with right hip: In the context of a known history of chronic right hip wound, patient presents with interval development of purulent drainage and increase in erythema associated with surrounding tissue concerning for wound infection with cellulitis, and warranting further evaluation for underlying osteomyelitis, particularly in the context of elevated general inflammatory markers in the form of CRP and ESR.  Not associate with any crepitus to increase concern for underlying necrotizing fasciitis.  SIRS criteria not met at this time, therefore presentation does not meet criteria for sepsis at the present time.  No evidence of associated hypotension.  In the context of reported purulent drainage with evaluation for underlying osteomyelitis pending at this time, will proceed with broad-spectrum antibiotics in the form of IV vancomycin and cefepime.  Of note, these antibiotics were administered in the ED this evening prior to collection of any blood cultures.  Of note, MRI of the right hip has been ordered by  EDP, with results currently pending.  Plan: IV vancomycin and cefepime, as above.  Repeat CBC with differential in the morning.  Follow-up for results of MRI right hip, as above.  Wound care consult.  As needed acetaminophen.      #) Acute prerenal azotemia: Presenting labs reflect acute prerenal azotemia without associated AKI.  Suspect mild intravascular depletion as a consequence of presenting acute infection relating to right hip wound, as further detailed above.   Plan: Continuous normal saline at 50 cc/h x 10 hours.  Monitor strict I's and O's and daily weights.  BMP in the morning.      #) Acute on chronic anemia: In the setting of suspected anemia of chronic disease with baseline hemoglobin range noted to  be 10-12, presenting labs reflect hemoglobin slightly low relative to this baseline range, while maintaining normocytic/normochromic findings as well as nonelevated RDW.  No clinical evidence to suggest active bleed at this time.  Suspect that this slight decline relative to baseline is from anemia acute disease superimposed on that of chronic, but will perform further laboratory analysis to further evaluate as conveyed below.  Patient is on daily baby aspirin at home.  Plan: Add on the following: Total iron, TIBC, ferritin, reticulocyte count, MMA, folic acid.  Check INR.  Repeat CBC in the morning.      #) Type 2 diabetes mellitus: Documented history of such, with most recent hemoglobin A1c found to be 5.6% on 04/18/2021.  Non-insulin-dependent, on metformin as result patient oral hypoglycemic agent.  Presenting blood sugar noted to be 185.   Plan: Hold home metformin during this hospitalization.  Accu-Cheks before every meal and at bedtime with low-dose sliding scale insulin.      DVT prophylaxis: scd's   Code Status: DNR  Family Communication: none Disposition Plan: Per Rounding Team Consults called: none;  Admission status: inpatient; med-tele.     Of note, this  patient was added by me to the following Admit List/Treatment Team: [email or web address].    Of note, the Adult Admission Order Set (Multimorbid order set) was used by me in the admission process for this patient.   PLEASE NOTE THAT DRAGON DICTATION SOFTWARE WAS USED IN THE CONSTRUCTION OF THIS NOTE.   Rhetta Mura DO [facility] Pager [phone] From [place]   05/08/2021, 7:36 PM

## 2021-05-09 ENCOUNTER — Encounter (HOSPITAL_COMMUNITY): Payer: Self-pay | Admitting: Internal Medicine

## 2021-05-09 ENCOUNTER — Inpatient Hospital Stay (HOSPITAL_COMMUNITY): Payer: Medicare Other

## 2021-05-09 DIAGNOSIS — F0390 Unspecified dementia without behavioral disturbance: Secondary | ICD-10-CM | POA: Diagnosis not present

## 2021-05-09 DIAGNOSIS — N19 Unspecified kidney failure: Secondary | ICD-10-CM | POA: Diagnosis not present

## 2021-05-09 DIAGNOSIS — S71001S Unspecified open wound, right hip, sequela: Secondary | ICD-10-CM

## 2021-05-09 DIAGNOSIS — L89214 Pressure ulcer of right hip, stage 4: Secondary | ICD-10-CM | POA: Diagnosis not present

## 2021-05-09 DIAGNOSIS — D649 Anemia, unspecified: Secondary | ICD-10-CM | POA: Diagnosis present

## 2021-05-09 LAB — COMPREHENSIVE METABOLIC PANEL
ALT: 15 U/L (ref 0–44)
AST: 14 U/L — ABNORMAL LOW (ref 15–41)
Albumin: 2.1 g/dL — ABNORMAL LOW (ref 3.5–5.0)
Alkaline Phosphatase: 38 U/L (ref 38–126)
Anion gap: 4 — ABNORMAL LOW (ref 5–15)
BUN: 31 mg/dL — ABNORMAL HIGH (ref 8–23)
CO2: 27 mmol/L (ref 22–32)
Calcium: 8.2 mg/dL — ABNORMAL LOW (ref 8.9–10.3)
Chloride: 106 mmol/L (ref 98–111)
Creatinine, Ser: 0.97 mg/dL (ref 0.44–1.00)
GFR, Estimated: 58 mL/min — ABNORMAL LOW (ref >60)
Glucose, Bld: 152 mg/dL — ABNORMAL HIGH (ref 70–99)
Potassium: 3.9 mmol/L (ref 3.5–5.1)
Sodium: 137 mmol/L (ref 135–145)
Total Bilirubin: 0.4 mg/dL (ref 0.3–1.2)
Total Protein: 6.2 g/dL — ABNORMAL LOW (ref 6.5–8.1)

## 2021-05-09 LAB — IRON AND TIBC
Iron: 17 ug/dL — ABNORMAL LOW (ref 28–170)
Saturation Ratios: 9 % — ABNORMAL LOW (ref 10.4–31.8)
TIBC: 188 ug/dL — ABNORMAL LOW (ref 250–450)
UIBC: 171 ug/dL

## 2021-05-09 LAB — CBC WITH DIFFERENTIAL/PLATELET
Abs Immature Granulocytes: 0.01 10*3/uL (ref 0.00–0.07)
Basophils Absolute: 0 10*3/uL (ref 0.0–0.1)
Basophils Relative: 0 %
Eosinophils Absolute: 0.3 10*3/uL (ref 0.0–0.5)
Eosinophils Relative: 6 %
HCT: 25.5 % — ABNORMAL LOW (ref 36.0–46.0)
Hemoglobin: 7.9 g/dL — ABNORMAL LOW (ref 12.0–15.0)
Immature Granulocytes: 0 %
Lymphocytes Relative: 12 %
Lymphs Abs: 0.5 10*3/uL — ABNORMAL LOW (ref 0.7–4.0)
MCH: 31.2 pg (ref 26.0–34.0)
MCHC: 31 g/dL (ref 30.0–36.0)
MCV: 100.8 fL — ABNORMAL HIGH (ref 80.0–100.0)
Monocytes Absolute: 0.5 10*3/uL (ref 0.1–1.0)
Monocytes Relative: 10 %
Neutro Abs: 3.2 10*3/uL (ref 1.7–7.7)
Neutrophils Relative %: 72 %
Platelets: 234 10*3/uL (ref 150–400)
RBC: 2.53 MIL/uL — ABNORMAL LOW (ref 3.87–5.11)
RDW: 13.2 % (ref 11.5–15.5)
WBC: 4.5 10*3/uL (ref 4.0–10.5)
nRBC: 0 % (ref 0.0–0.2)

## 2021-05-09 LAB — GLUCOSE, CAPILLARY
Glucose-Capillary: 96 mg/dL (ref 70–99)
Glucose-Capillary: 139 mg/dL — ABNORMAL HIGH (ref 70–99)
Glucose-Capillary: 167 mg/dL — ABNORMAL HIGH (ref 70–99)
Glucose-Capillary: 165 mg/dL — ABNORMAL HIGH (ref 70–99)

## 2021-05-09 LAB — MAGNESIUM: Magnesium: 1.8 mg/dL (ref 1.7–2.4)

## 2021-05-09 LAB — FERRITIN: Ferritin: 51 ng/mL (ref 11–307)

## 2021-05-09 LAB — PROTIME-INR
INR: 1.2 (ref 0.8–1.2)
Prothrombin Time: 14.7 seconds (ref 11.4–15.2)

## 2021-05-09 LAB — RETICULOCYTES
Immature Retic Fract: 14.3 % (ref 2.3–15.9)
RBC.: 2.49 MIL/uL — ABNORMAL LOW (ref 3.87–5.11)
Retic Count, Absolute: 37.4 10*3/uL (ref 19.0–186.0)
Retic Ct Pct: 1.5 % (ref 0.4–3.1)

## 2021-05-09 LAB — FOLATE: Folate: 10.6 ng/mL (ref >5.9)

## 2021-05-09 IMAGING — CT CT FEMUR *R* W/O CM
3 of 7 series · 11 of 33 positions shown, 12 images · non-contrast
Comparison: Radiographs dated [DATE]

CLINICAL DATA: Suspected osteomyelitis

EXAM:
CT OF THE LOWER RIGHT EXTREMITY WITHOUT CONTRAST
TECHNIQUE: Multidetector CT imaging of the right lower extremity was performed
according to the standard protocol.

[Series 4: rt femur wo 1.0 br60 · axial · 0.53mm/px · z∈[-858,-510]mm · 4 of 746 slices shown, 5 images]
[im 166/746  soft-tissue]
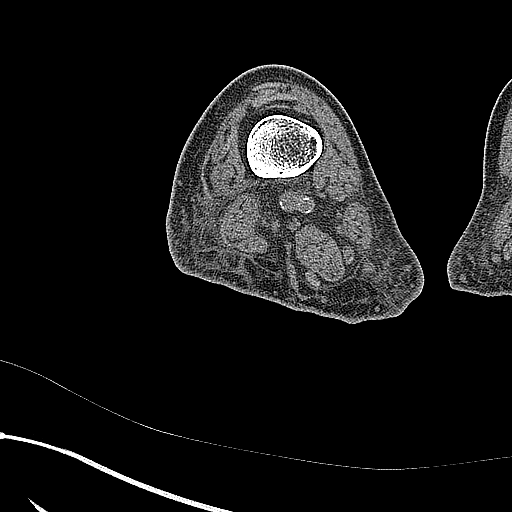
[im 166/746  bone]
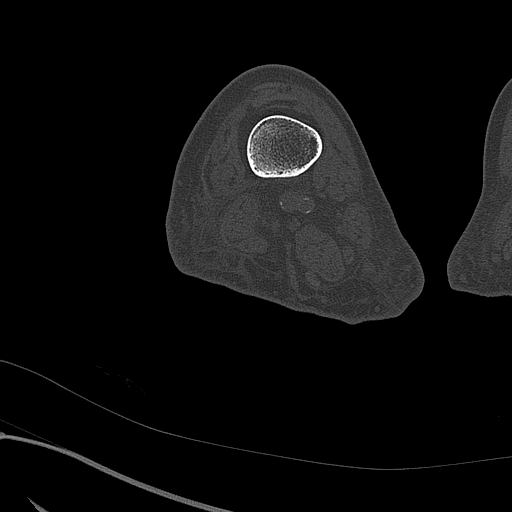
[im 332/746  bone]
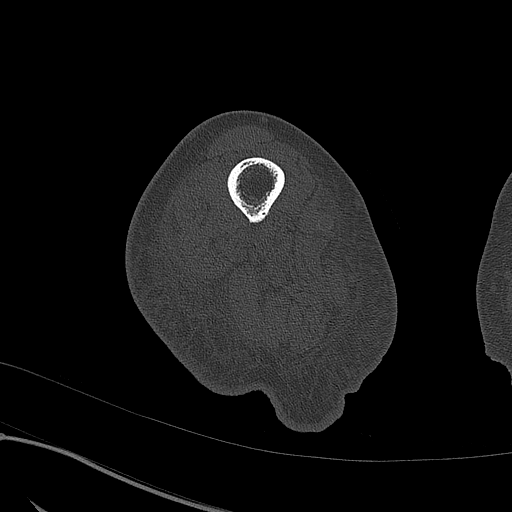
[im 497/746  bone]
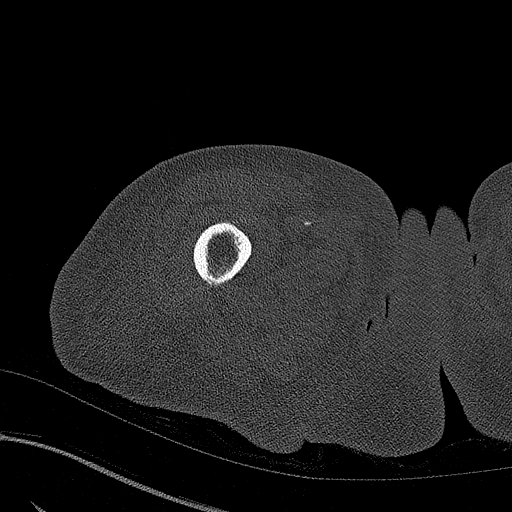
[im 663/746  bone]
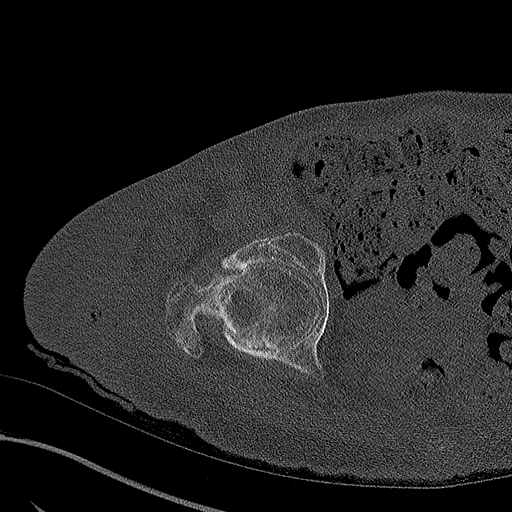

[Series 6: rt femur wo 4.0 mpr cor · coronal · 0.61mm/px · 2 of 68 slices shown]
[im 34/68  bone]
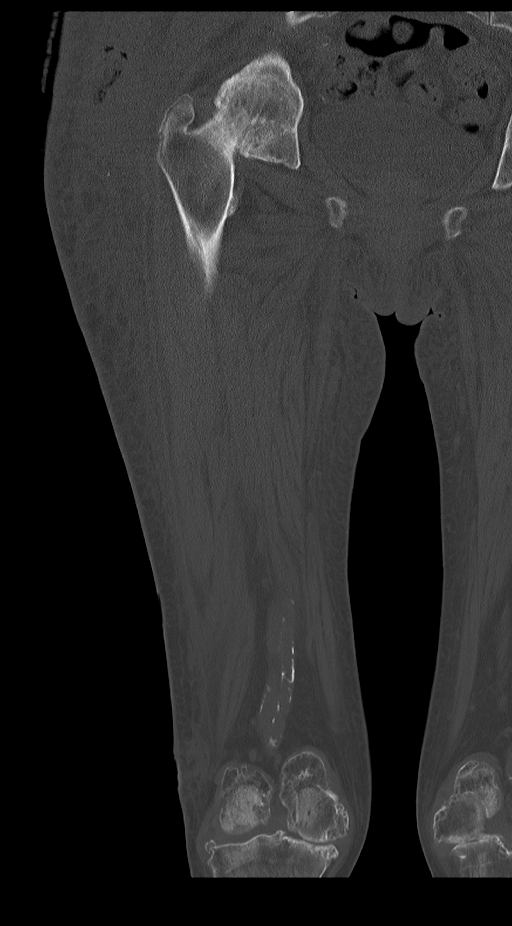
[im 67/68  bone]
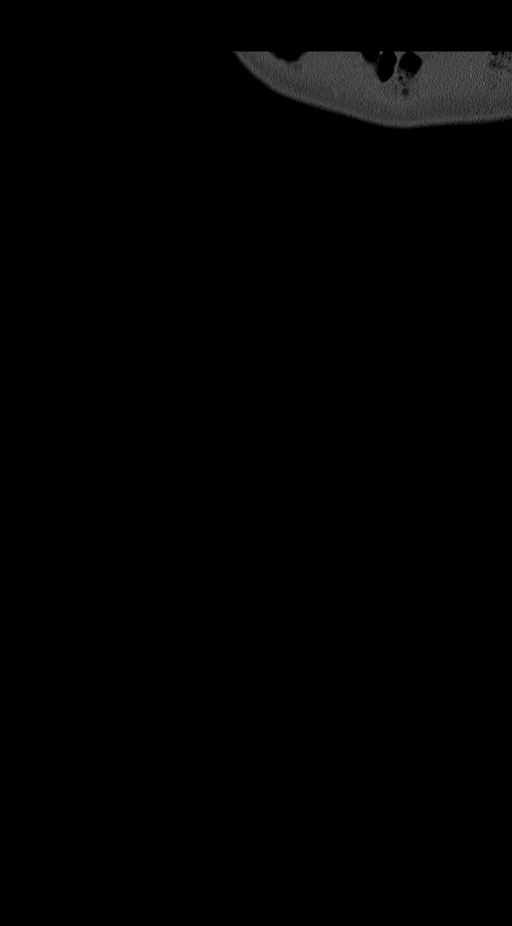

[Series 7: rt femur wo 4.0 mpr sag · sagittal · 0.53mm/px · 5 of 79 slices shown]
[im 14/79  bone]
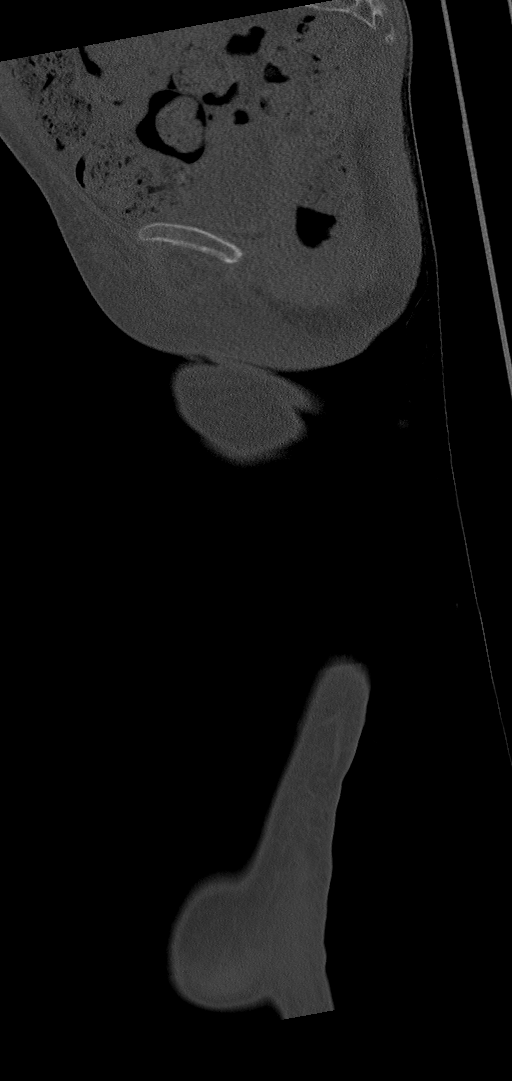
[im 27/79  bone]
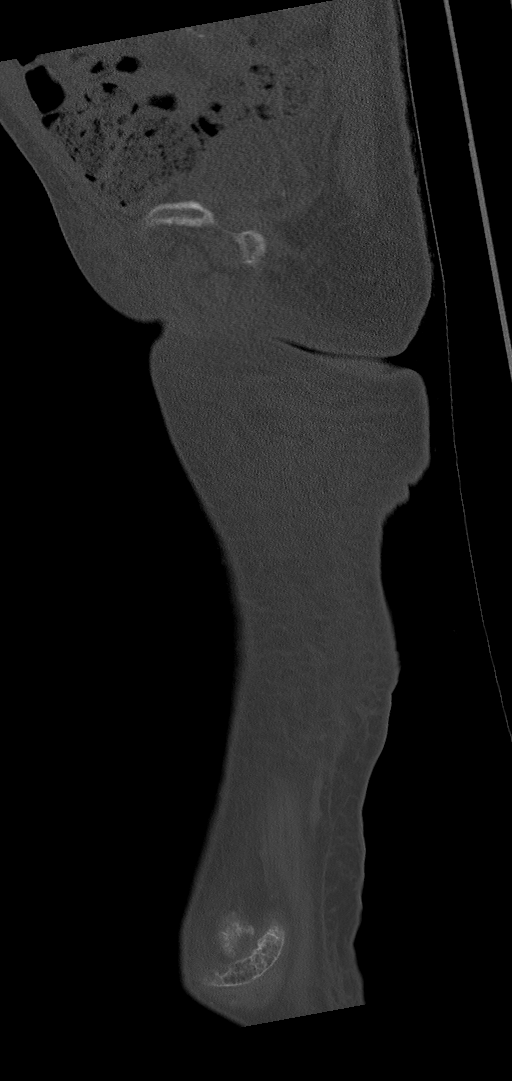
[im 40/79  bone]
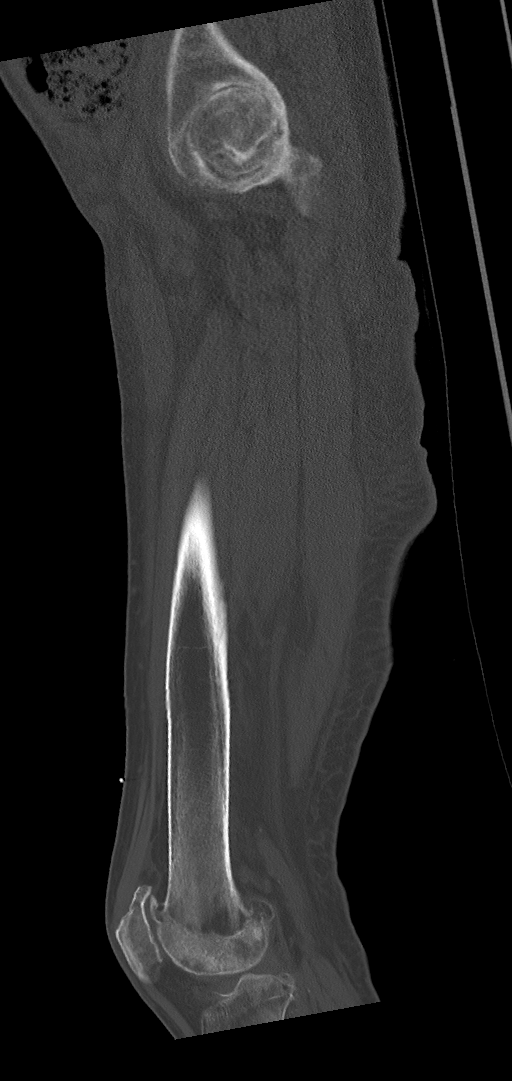
[im 53/79  bone]
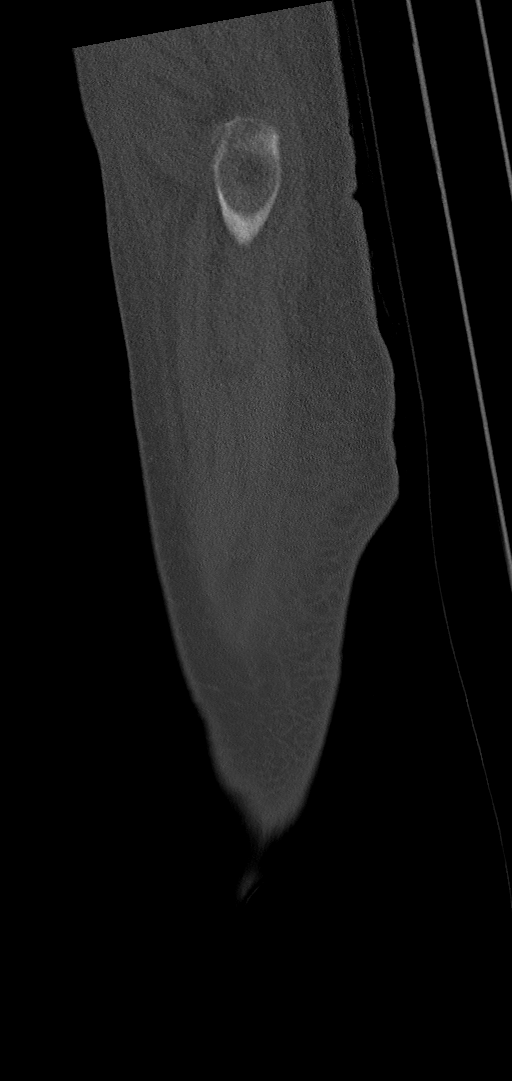
[im 66/79  bone]
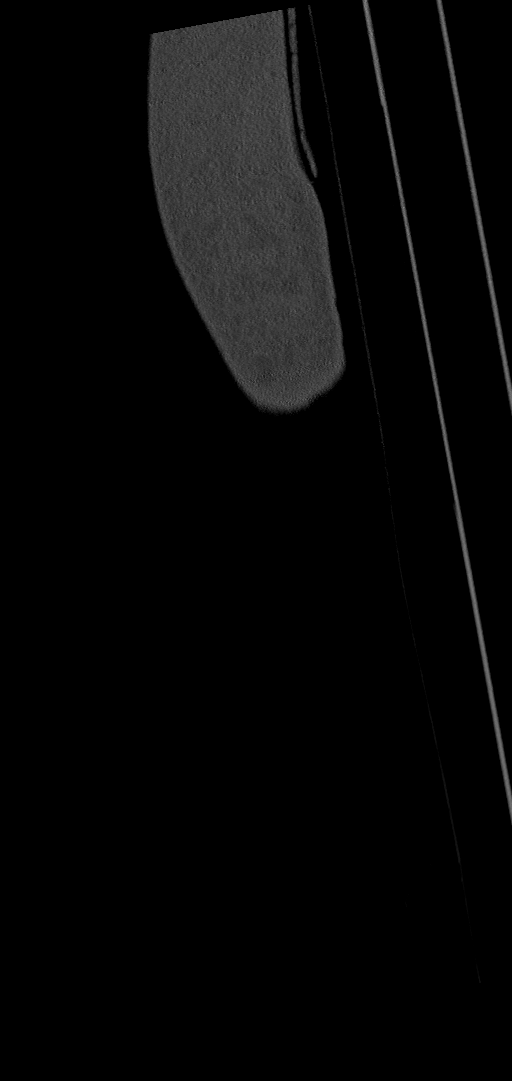

[11 of 33 positions shown; findings below may reference images not displayed]

FINDINGS: Bones/Joint/Cartilage

No cortical erosion or periosteal reaction concerning for
osteomyelitis. Advanced right hip osteoarthritis with bone on bone
articulation and prominent osteophytes. Advanced right knee
osteoarthritis. No acute fracture or suspicious osseous lesion

Ligaments

Suboptimally assessed by CT.

Muscles and Tendons

No acute abnormality.

Soft tissues

Deep skin wound in the posterolateral gluteal region with a gas
containing soft tissue collection measuring approximately 3.5 x
x 8.3 cm (AP x TR x CC). Generalized subcutaneous soft tissue edema
of the right lower extremity.
IMPRESSION: 1. Gas containing soft tissue collection in the posterolateral
gluteal region measuring at least 3.5 x 2.0 x 8.3 cm (AP x TR x CC).
Surgical consultation for further management could be helpful.

2.  No CT evidence of acute osteomyelitis.

3.  Advanced right hip and right knee osteoarthritis.

## 2021-05-09 IMAGING — DX DG FEMUR 2+V PORT*R*
4 series · 4 of 4 positions shown · non-contrast
Comparison: MRI hip and right hip x-ray [DATE]

CLINICAL DATA: Osteomyelitis of femur.

EXAM:
RIGHT FEMUR PORTABLE 2 VIEW

[femur lat (1 of 2)]
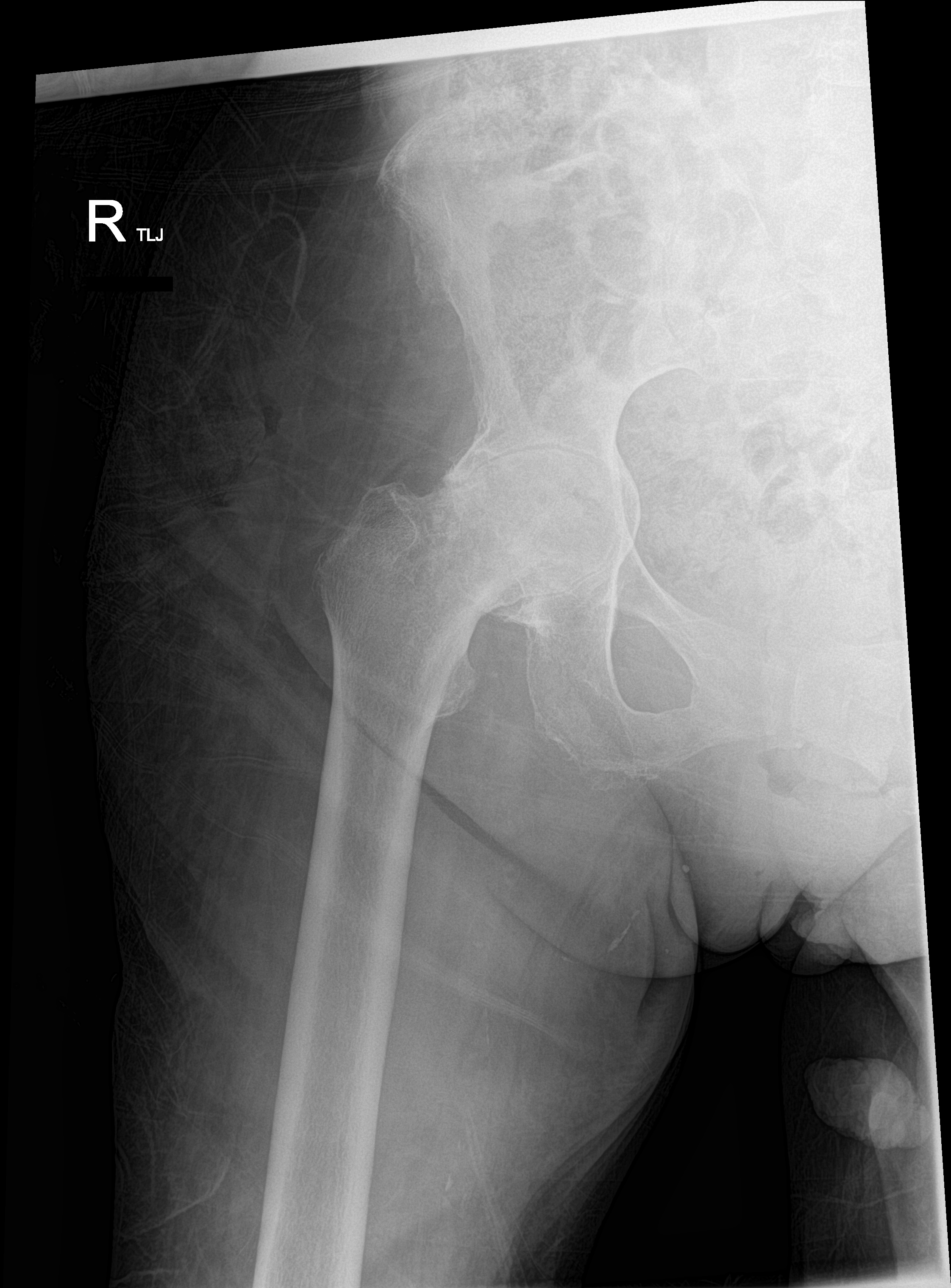

[femur ap (1 of 2)]
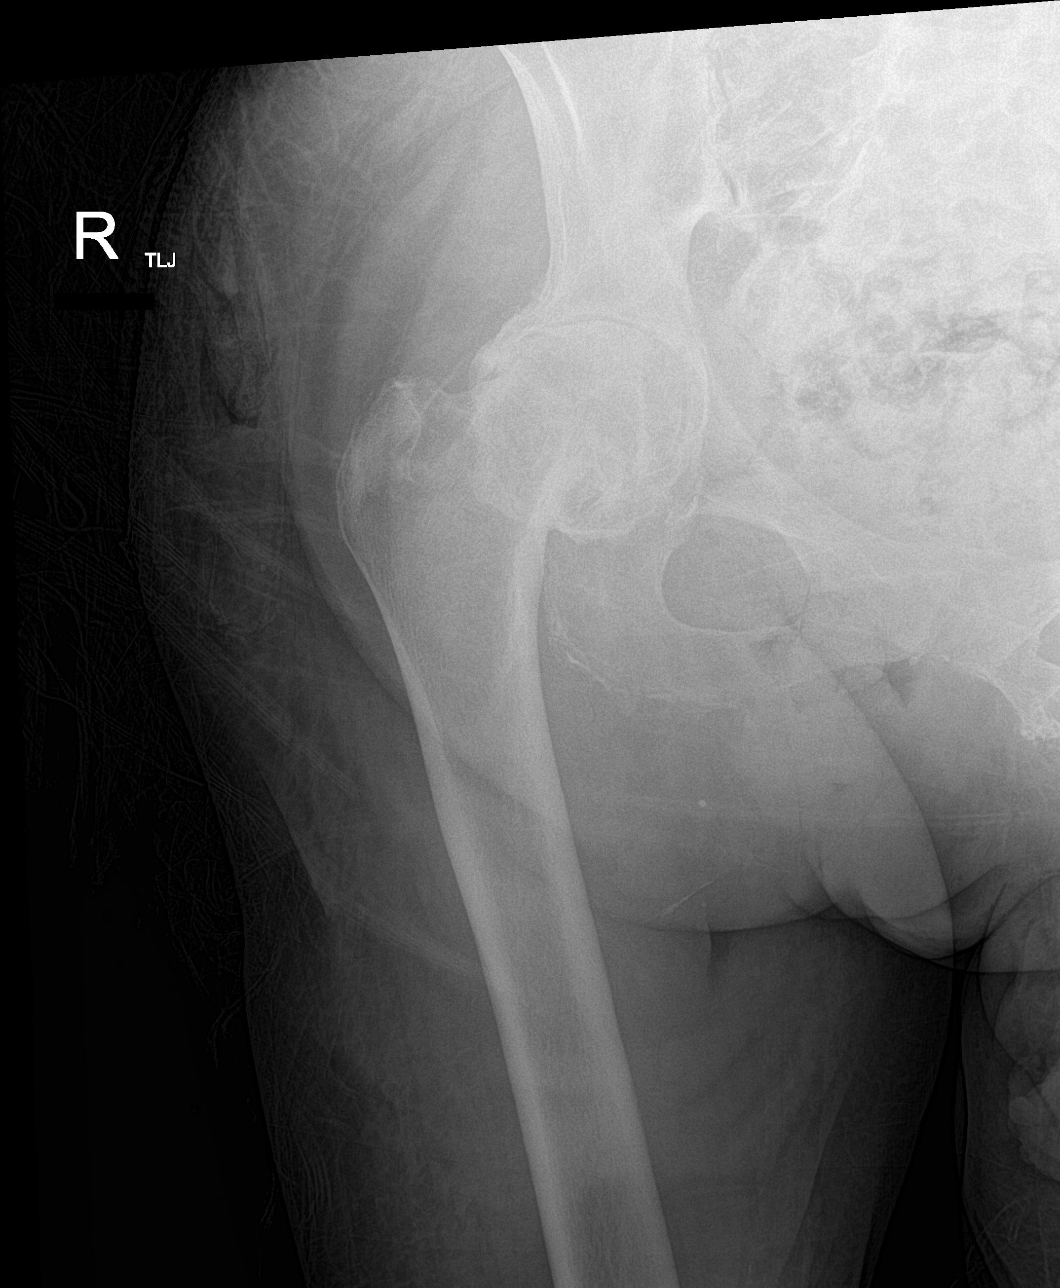

[femur lat (2 of 2)]
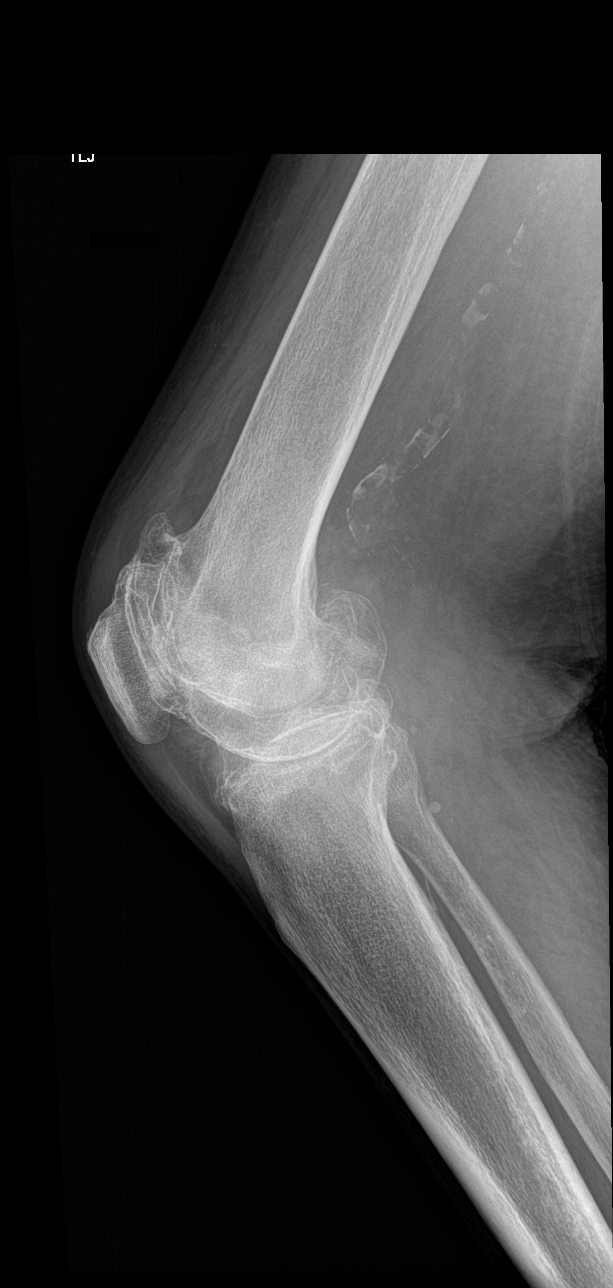

[femur ap (2 of 2)]
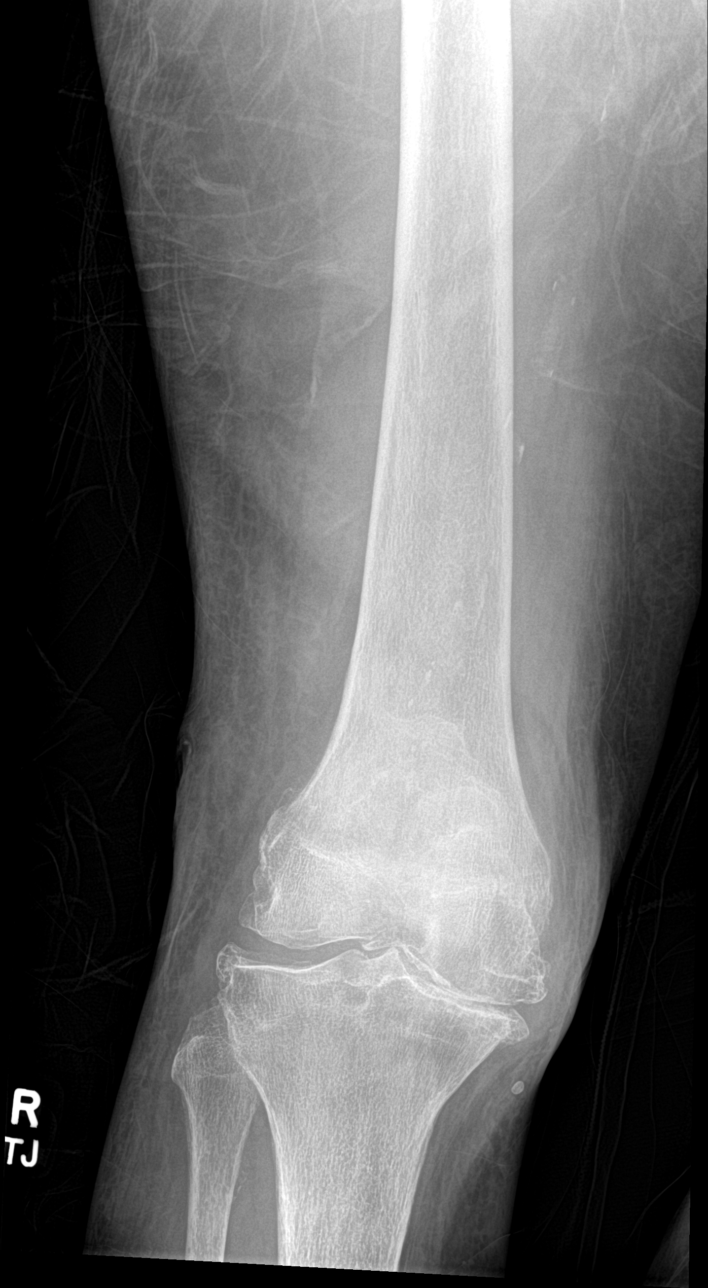

[4 of 4 positions shown; findings below may reference images not displayed]

FINDINGS: No evidence for acute fracture. No cortical erosions are identified.

Again seen are severe/end-stage degenerative changes of the right
hip with joint space narrowing and osteophyte formation. There also
moderate severe degenerative changes of the right knee with severe
medial compartment and patellofemoral compartment joint space
narrowing and tricompartmental osteophyte formation.

Soft tissue swelling and air lateral to the right hip has slightly
decreased. There are no new areas of soft tissue air. Vascular
calcifications are seen the soft tissues.
IMPRESSION: 1. No acute bony abnormality.
2. Soft tissue swelling and air in the right lateral hip have
decreased. No new areas of soft tissue air.
3. Degenerative changes of the right knee and right hip appear
stable.

## 2021-05-09 MED ORDER — HYDRALAZINE HCL 25 MG PO TABS
25.0000 mg | ORAL_TABLET | Freq: Three times a day (TID) | ORAL | Status: DC
  Administered 2021-05-09 – 2021-05-16 (×17): 25 mg via ORAL
  Filled 2021-05-09 (×19): qty 1

## 2021-05-09 MED ORDER — SODIUM CHLORIDE 0.9 % IV SOLN
250.0000 mg | Freq: Every day | INTRAVENOUS | Status: AC
  Administered 2021-05-09 – 2021-05-10 (×2): 250 mg via INTRAVENOUS
  Filled 2021-05-09 (×2): qty 20

## 2021-05-09 MED ORDER — DILTIAZEM HCL ER COATED BEADS 240 MG PO CP24
240.0000 mg | ORAL_CAPSULE | Freq: Every day | ORAL | Status: DC
  Administered 2021-05-09 – 2021-05-15 (×6): 240 mg via ORAL
  Filled 2021-05-09 (×6): qty 1

## 2021-05-09 MED ORDER — SODIUM CHLORIDE 0.9 % IV SOLN: INTRAVENOUS | Status: AC

## 2021-05-09 MED ORDER — ADULT MULTIVITAMIN W/MINERALS CH
1.0000 | ORAL_TABLET | Freq: Every day | ORAL | Status: DC
  Administered 2021-05-09 – 2021-05-16 (×7): 1 via ORAL
  Filled 2021-05-09 (×7): qty 1

## 2021-05-09 MED ORDER — ENSURE ENLIVE PO LIQD
237.0000 mL | Freq: Two times a day (BID) | ORAL | Status: DC
  Administered 2021-05-09 – 2021-05-16 (×12): 237 mL via ORAL

## 2021-05-09 MED ORDER — JUVEN PO PACK
1.0000 | PACK | Freq: Two times a day (BID) | ORAL | Status: DC
  Administered 2021-05-09 – 2021-05-16 (×15): 1 via ORAL
  Filled 2021-05-09 (×16): qty 1

## 2021-05-09 NOTE — Progress Notes (Signed)
PROGRESS NOTE    Penny STRENG  PZW:[id number] DOB: 05/13/1935 DOA: 05/08/2021 PCP: Kelton Pillar, MD   Chief Complaint  Patient presents with   Wound Check    Brief Narrative: [name] was admitted to the hospital with a working diagnosis of pressure wound of the right hip.   Penny [name] is a 86 y.o. female with medical history significant for chronic right hip wound, type 2 diabetes mellitus, anemia of chronic disease with baseline hemoglobin 10-12,  who is admitted to on 05/08/2021 with infected stage IV pressure wound. Patient was at a rehab facility to recover from recent fall.  She was undergoing outpatient treatment for chronic right hip wound.   Wound became erythematous was drained at the rehab facility.  Patient's niece reports that the discharge was purulent.   Denies any associated subjective fever, chills, rigors, or generalized myalgias.  Denies any new erythema at any other location.Denies any recent headache, neck stiffness, rhinitis, rhinorrhea, sore throat, sob, wheezing, cough, nausea, vomiting, abdominal pain, diarrhea.   Patient does endorse 90 pound weight loss since the start of 2022.  Patient states that now that she has to only cook for herself she has not been eating as much.  Patient states that she mostly needs easy to prepare meals like peanut butter sandwiches.  Sodium 136, potassium 4.2, chloride 102, bicarb 27, glucose 185, BUN 37, creatinine 1.04, calcium 8.5, anion gap 7, magnesium 2, albumin 2.4, AST 18, ALT 17, total protein 7.2, total bilirubin 0.5,  WBC 7.2  Initial hemoglobin of 9.2 with MCV of 100; now 7.9 with MCV of 100.8.  Patient has received IV fluids, drop in hemoglobin likely due to rehydration.  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  There was also some concern about osteomyelitis.  While in the ED, MRI was ordered; images were limited due to patient movement during MRI.  Patient has been afebrile and normotensive.  Blood cultures were collected, but after administration of antibiotics.  Patient will likely have difficulty tolerating repeat MRI; will get x-ray and CT to rule out osteomyelitis assessment. -X-ray and CT of right femur. -Continue IV vancomycin and cefepime. -Repeat MRI right hip. -Wound care has seen the patient. -Blood culture pending -PT and OT assessment needed   2) Chronic anemia: Patient's baseline hemoglobin range noted to be 10-12. Hemoglobin at presentation of 9.2; currently 7.9.  Patient's MCV currently elevated at 100.8. Iron 17, TIBC 171, TIBC 188, folate 10.6.  Patient does report eating less and that her diet is mainly composed of peanut butter sandwiches.  Patient currently has a macrocytic anemia likely due to her diet. No clinical evidence to suggest active bleed at this time -B12 level in the a.m. -IV iron supplementation   3) Type 2 diabetes mellitus: Non-insulin-dependent: with most recent hemoglobin A1c found to be 5.6% on 04/18/2021. Presenting blood sugar noted to be 185.  -Hold home metformin.  4) Acute prerenal azotemia: Presentation labs consistent with acute prerenal azotemia without associated AKI.  This is likely due to  intravascular depletion. -Patient status post administration of normal saline 50 cc/hr for 10 hours.  Creatinine downtrending, initially 1.04; currently 0.97.  4) Pruritus: Patient reports itchiness of her skin.  Patient's skin appears very dry.  Patient can benefit from a lotion.  If this does not improve, further  work-up may be needed.  5) Concern for dementia: Patient's niece expressed concerns about dementia and patient. She will likely need outpatient follow-up with neurology to address these concerns.     DVT prophylaxis: SCDs  Code Status: DNR  Family Communication: Patient's niece is at bedside.  Disposition:   Status is: Inpatient  Remains inpatient appropriate because: Wound care        Consultants:  Wound care  Procedures:  Not applicable  Antimicrobials:   IV vancomycin and cefepime.   Subjective: Patient's niece at bedside.  Patient is oriented to self, but not place and time.  Prior to hospital admission, patient was living assisted living facility.  Patient's niece stated that that the hip bone appeared after her hospitalization on 04/15/2021 for fall.  She states that the other wounds associated with her fall have healed but this was not has gotten progressively worse.  Patient's niece also states her aunt's memory has worsened; she expressed some concerns about dementia her aunt.  patient did endorse pain of 8/10.  Patient reports itchiness of her skin.  She states that she was previously using Aquaphor with little improvement.    Objective: Vitals:   05/09/21 0025 05/09/21 0135 05/09/21 0322 05/09/21 1024  BP: 134/79  128/69 136/77  Pulse: 65  67 62  Resp: 20  18 16   Temp: 97.6 F (36.4 C)  98.6 F (37 C) 98.2 F (36.8 C)  TempSrc: Axillary  Oral Oral  SpO2: 98%  100% 100%  Weight:  64.8 kg    Height:        Intake/Output Summary (Last 24 hours) at 05/09/2021 1133 Last data filed at 05/09/2021 9201 Gross per 24 hour  Intake 364.19 ml  Output 350 ml  Net 14.19 ml   Filed Weights   05/08/21 2004 05/08/21 2119 05/09/21 0135  Weight: 63.6 kg 64.8 kg 64.8 kg    Examination:  General exam: Appears calm and comfortable  Respiratory system: Clear to auscultation. Respiratory effort normal. Cardiovascular system: S1 & S2 heard, RRR. No JVD, murmurs, rubs,  gallops or clicks. No pedal edema. Gastrointestinal system: Abdomen is nondistended, soft and nontender. No organomegaly or masses felt. Normal bowel sounds heard. Central nervous system: Patient oriented to self, but not place and time. No focal neurological deficits. Extremities: Symmetric 5 x 5 power. Skin: Pressure ulcer on right thigh located posteriorly.  Otherwise, patient skin appears dry on extremities. Psychiatry: Judgement and insight appear impaired. mood & affect appropriate.      Data Reviewed: I have personally reviewed following labs and imaging studies  CBC: Recent Labs  Lab 05/08/21 1625 05/09/21 0544  WBC 7.2 4.5  NEUTROABS 5.9 3.2  HGB 9.2* 7.9*  HCT 29.6* 25.5*  MCV 100.0 100.8*  PLT 286 007    Basic Metabolic Panel: Recent Labs  Lab 05/08/21 1625 05/09/21 0544  NA 136 137  K 4.2 3.9  CL 102 106  CO2 27 27  GLUCOSE 185* 152*  BUN 37* 31*  CREATININE 1.04* 0.97  CALCIUM 8.5* 8.2*  MG 2.0 1.8    GFR: Estimated Creatinine Clearance: 38.8 mL/min (by C-G formula based on SCr of 0.97 mg/dL).  Liver Function Tests: Recent  Labs  Lab 05/08/21 1625 05/09/21 0544  AST 18 14*  ALT 17 15  ALKPHOS 54 38  BILITOT 0.5 0.4  PROT 7.2 6.2*  ALBUMIN 2.4* 2.1*    CBG: Recent Labs  Lab 05/08/21 2122 05/09/21 0759  GLUCAP 181* 96     Recent Results (from the past 240 hour(s))  Resp Panel by RT-PCR (Flu A&B, Covid) Nasopharyngeal Swab     Status: None   Collection Time: 05/08/21  8:50 PM   Specimen: Nasopharyngeal Swab; Nasopharyngeal(NP) swabs in vial transport medium  Result Value Ref Range Status   SARS Coronavirus 2 by RT PCR NEGATIVE NEGATIVE Final    Comment: (NOTE) SARS-CoV-2 target nucleic acids are NOT DETECTED.  The SARS-CoV-2 RNA is generally detectable in upper respiratory specimens during the acute phase of infection. The lowest concentration of SARS-CoV-2 viral copies this assay can detect is 138 copies/mL. A negative result  does not preclude SARS-Cov-2 infection and should not be used as the sole basis for treatment or other patient management decisions. A negative result may occur with  improper specimen collection/handling, submission of specimen other than nasopharyngeal swab, presence of viral mutation(s) within the areas targeted by this assay, and inadequate number of viral copies(<138 copies/mL). A negative result must be combined with clinical observations, patient history, and epidemiological information. The expected result is Negative.  Fact Sheet for Patients:  EntrepreneurPulse.com.au  Fact Sheet for Healthcare Providers:  IncredibleEmployment.be  This test is no t yet approved or cleared by the [place] FDA and  has been authorized for detection and/or diagnosis of SARS-CoV-2 by FDA under an Emergency Use Authorization (EUA). This EUA will remain  in effect (meaning this test can be used) for the duration of the COVID-19 declaration under Section 564(b)(1) of the Act, 21 U.S.C.section 360bbb-3(b)(1), unless the authorization is terminated  or revoked sooner.       Influenza A by PCR NEGATIVE NEGATIVE Final   Influenza B by PCR NEGATIVE NEGATIVE Final    Comment: (NOTE) The Xpert Xpress SARS-CoV-2/FLU/RSV plus assay is intended as an aid in the diagnosis of influenza from Nasopharyngeal swab specimens and should not be used as a sole basis for treatment. Nasal washings and aspirates are unacceptable for Xpert Xpress SARS-CoV-2/FLU/RSV testing.  Fact Sheet for Patients: EntrepreneurPulse.com.au  Fact Sheet for Healthcare Providers: IncredibleEmployment.be  This test is not yet approved or cleared by the [place] FDA and has been authorized for detection and/or diagnosis of SARS-CoV-2 by FDA under an Emergency Use Authorization (EUA). This EUA will remain in effect (meaning this test can be used) for  the duration of the COVID-19 declaration under Section 564(b)(1) of the Act, 21 U.S.C. section 360bbb-3(b)(1), unless the authorization is terminated or revoked.  Performed at [facility], [place] [address], [place], [place] [address]          Radiology Studies: MR HIP RIGHT WO CONTRAST  Result Date: 05/08/2021 CLINICAL DATA:  Chronic ulcer to right hip EXAM: MR OF THE RIGHT HIP WITHOUT CONTRAST TECHNIQUE: Multiplanar, multisequence MR imaging was performed. No intravenous contrast was administered. Examination was terminated after only 3 series were obtained secondary to patient pain. COMPARISON:  Radiograph 05/08/2021 FINDINGS: All 3 sequences are significantly degraded by patient motion. Bones: Heterogeneous marrow signal. There may be edema within the sacrococcygeal region on limited axial views. Small focal area of edema within the right superior pubic ramus complete loss of the hip joint space with bone on bone appearance and remodeling  of the femoral heads. Probable left supra-acetabular cyst. Articular cartilage and labrum Articular cartilage:  Nondiagnostic Labrum:  Nondiagnostic Joint or bursal effusion Joint effusion:  No gross hip effusions. Bursae: No gross bursal distension Muscles and tendons Muscles and tendons:  Generalized atrophy Other findings Miscellaneous: The patient's right hip soft tissue ulcer is only partially imaged on coronal views. There is a ulcer within the right lateral hip soft tissues. Abundant subcutaneous edema within the partially visualized right hip soft tissues laterally. There is probable edema within the lateral gluteus muscles as well. No gross focal fluid collection. IMPRESSION: 1. Nearly nondiagnostic study as only limited sequences were obtained, all significantly degraded by patient motion. In addition, the patient's right hip ulcer is incompletely included in the field of view. 2. Generalized soft tissue edema within the lateral hip  soft tissues with suspected edema in the gluteus muscles. No gross marrow edema within the underlying femur allowing for excessive motion. 3. Nonspecific foci of suspected edema within the sacrococcygeal region and right superior pubic ramus. 4. Severe arthritis of the bilateral hips without gross hip effusion. Electronically Signed   By: Donavan Foil M.D.   On: 05/08/2021 21:40   DG Hip Unilat W or Wo Pelvis 2-3 Views Right  Result Date: 05/08/2021 CLINICAL DATA:  Pressure ulcer right hip and buttock EXAM: DG HIP (WITH OR WITHOUT PELVIS) 2-3V RIGHT COMPARISON:  04/17/2021 FINDINGS: No fracture or malalignment. 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 Bone on bone appearance with sclerosis. Gas collection within the right hip soft tissues presumably related to the history of wound or ulcer. Pubic symphysis and rami appear intact. IMPRESSION: 1. Gas collection within the right hip soft tissues presumably related to the history of wound or ulcer. No definite acute osseous abnormality but would correlate with CT or MRI if continued concern. 2. Severe arthritis of the bilateral hips. Electronically Signed   By: Donavan Foil M.D.   On: 05/08/2021 17:52        Scheduled Meds:  diltiazem  240 mg Oral Daily   feeding supplement  237 mL Oral BID BM   hydrALAZINE  25 mg Oral Q8H   insulin aspart  0-9 Units Subcutaneous TID WC   multivitamin with minerals  1 tablet Oral Daily   nutrition supplement (JUVEN)  1 packet Oral BID BM   Continuous Infusions:  sodium chloride 50 mL/hr at 05/09/21 0604   ceFEPime (MAXIPIME) IV 2 g (05/09/21 0832)   vancomycin       LOS: 1 day    Time spent: [address]    Gerrit Halls, MS4  05/09/2021, 11:33 AM

## 2021-05-09 NOTE — TOC Initial Note (Signed)
Transition of Care [facility]) - Initial/Assessment Note    Patient Details  Name: Penny [name] MRN: [id number] Date of Birth: 18-Jul-1934  [facility]) CM/SW Contact:    Trish Mage, LCSW Phone Number: 05/09/2021, 3:52 PM  Clinical Narrative:  Patient who is from [facility] for short term rehab will likely return there, but niece [name] is also looking for LTC.  She had made initial inquiry to [facility], and I was contacted by Lexine Baton from same asking me to post patient on [facility] as they are looking into expanding their LTC MCD beds.  Niece had informed me that she had already applied for MCD, so patient is "MCD pending."  TOC will continue to follow during the course of hospitalization.                  Expected Discharge Plan: Skilled Nursing Facility Barriers to Discharge: Continued Medical Work up   Patient Goals and CMS Choice        Expected Discharge Plan and Services Expected Discharge Plan: [place]                                              Prior Living Arrangements/Services                       Activities of Daily Living Home Assistive Devices/Equipment: Environmental consultant (specify type), Bedside commode/3-in-1, Shower chair with back, Eyeglasses ADL Screening (condition at time of admission) Patient's cognitive ability adequate to safely complete daily activities?: Yes Is the patient deaf or have difficulty hearing?: No Does the patient have difficulty seeing, even when wearing glasses/contacts?: No Does the patient have difficulty concentrating, remembering, or making decisions?: No Patient able to express need for assistance with ADLs?: Yes Does the patient have difficulty dressing or bathing?: Yes Independently performs ADLs?: No Communication: Independent Dressing (OT): Independent Grooming: Independent Feeding: Independent Bathing: Needs assistance Is this a change from baseline?: Pre-admission baseline Toileting: Needs  assistance Is this a change from baseline?: Pre-admission baseline In/Out Bed: Needs assistance Is this a change from baseline?: Pre-admission baseline Walks in Home: Needs assistance Is this a change from baseline?: Pre-admission baseline Does the patient have difficulty walking or climbing stairs?: Yes Weakness of Legs: Both Weakness of Arms/Hands: Both  Permission Sought/Granted                  Emotional Assessment              Admission diagnosis:  Complicated open wound of right hip [S71.001A] Pressure injury of right hip, stage 4 ([place]) [H06.237] Patient Active Problem List   Diagnosis Date Noted   Acute prerenal azotemia 05/09/2021   Acute on chronic anemia 62/83/1517   Complicated open wound of right hip 05/08/2021   Malnutrition of moderate degree 61/60/7371   Acute metabolic encephalopathy 01/09/9484   Hypothermia due to exposure 04/17/2021   Pressure ulcer, shoulder blades, stage II ([place]) - right posterior shoulder 04/17/2021   Pressure ulcer, hip, right, unstageable ([place]) 04/17/2021   CKD (chronic kidney disease) stage 3, GFR 30-59 ml/min ([place]) 04/17/2021   Pain due to onychomycosis of toenails of both feet 02/01/2021   Type 2 diabetes mellitus with vascular disease ([place]) 02/01/2021   PCP:  Kelton Pillar, MD Pharmacy:   [facility] [place], [place] -  [place] [address] Phone: [phone] Fax: [phone]     Social Determinants of Health (SDOH) Interventions    Readmission Risk Interventions No flowsheet data found.

## 2021-05-09 NOTE — Consult Note (Signed)
[facility] Nurse Consult Note: Reason for Consult: Consult requested for right hip.  Performed remotely after review of photos in the EMR and progress notes. Wound type: Chronic Stage 4 pressure injury to right hip; 10% yellow, 90% red, mod amt tan drainage Pressure Injury POA: Yes Dressing procedure/placement/frequency: Topical treatment orders provided for bedside nurses to perform as follows to provide antimicrobial benefits and absorb drainage: Pack right hip with Aquacel Kellie Simmering # [phone]) Q day, using swab to fill, then cover with foam dressing.  (Change foam dressing Q 3 days or PRN soiling.) Please re-consult if further assistance is needed.  Thank-you,  Julien Girt MSN, [place], [place], [place], [place]

## 2021-05-10 ENCOUNTER — Encounter (HOSPITAL_COMMUNITY): Payer: Self-pay | Admitting: Internal Medicine

## 2021-05-10 DIAGNOSIS — N19 Unspecified kidney failure: Secondary | ICD-10-CM | POA: Diagnosis not present

## 2021-05-10 DIAGNOSIS — S71001S Unspecified open wound, right hip, sequela: Secondary | ICD-10-CM | POA: Diagnosis not present

## 2021-05-10 DIAGNOSIS — D649 Anemia, unspecified: Secondary | ICD-10-CM | POA: Diagnosis not present

## 2021-05-10 LAB — BASIC METABOLIC PANEL
Anion gap: 6 (ref 5–15)
BUN: 36 mg/dL — ABNORMAL HIGH (ref 8–23)
CO2: 22 mmol/L (ref 22–32)
Calcium: 7.9 mg/dL — ABNORMAL LOW (ref 8.9–10.3)
Chloride: 106 mmol/L (ref 98–111)
Creatinine, Ser: 1.11 mg/dL — ABNORMAL HIGH (ref 0.44–1.00)
GFR, Estimated: 49 mL/min — ABNORMAL LOW (ref >60)
Glucose, Bld: 113 mg/dL — ABNORMAL HIGH (ref 70–99)
Potassium: 4.3 mmol/L (ref 3.5–5.1)
Sodium: 134 mmol/L — ABNORMAL LOW (ref 135–145)

## 2021-05-10 LAB — CBC WITH DIFFERENTIAL/PLATELET
Abs Immature Granulocytes: 0.02 10*3/uL (ref 0.00–0.07)
Basophils Absolute: 0 10*3/uL (ref 0.0–0.1)
Basophils Relative: 0 %
Eosinophils Absolute: 0.3 10*3/uL (ref 0.0–0.5)
Eosinophils Relative: 6 %
HCT: 25.7 % — ABNORMAL LOW (ref 36.0–46.0)
Hemoglobin: 7.7 g/dL — ABNORMAL LOW (ref 12.0–15.0)
Immature Granulocytes: 0 %
Lymphocytes Relative: 13 %
Lymphs Abs: 0.6 10*3/uL — ABNORMAL LOW (ref 0.7–4.0)
MCH: 30.8 pg (ref 26.0–34.0)
MCHC: 30 g/dL (ref 30.0–36.0)
MCV: 102.8 fL — ABNORMAL HIGH (ref 80.0–100.0)
Monocytes Absolute: 0.6 10*3/uL (ref 0.1–1.0)
Monocytes Relative: 13 %
Neutro Abs: 3.2 10*3/uL (ref 1.7–7.7)
Neutrophils Relative %: 68 %
Platelets: 218 10*3/uL (ref 150–400)
RBC: 2.5 MIL/uL — ABNORMAL LOW (ref 3.87–5.11)
RDW: 13.2 % (ref 11.5–15.5)
WBC: 4.6 10*3/uL (ref 4.0–10.5)
nRBC: 0 % (ref 0.0–0.2)

## 2021-05-10 LAB — GLUCOSE, CAPILLARY
Glucose-Capillary: 103 mg/dL — ABNORMAL HIGH (ref 70–99)
Glucose-Capillary: 141 mg/dL — ABNORMAL HIGH (ref 70–99)
Glucose-Capillary: 169 mg/dL — ABNORMAL HIGH (ref 70–99)
Glucose-Capillary: 189 mg/dL — ABNORMAL HIGH (ref 70–99)

## 2021-05-10 LAB — VITAMIN B12: Vitamin B-12: 495 pg/mL (ref 180–914)

## 2021-05-10 MED ORDER — POLYETHYLENE GLYCOL 3350 17 G PO PACK
17.0000 g | PACK | Freq: Every day | ORAL | Status: DC
  Administered 2021-05-10 – 2021-05-12 (×2): 17 g via ORAL
  Filled 2021-05-10 (×4): qty 1

## 2021-05-10 MED ORDER — CEFAZOLIN SODIUM-DEXTROSE 2-4 GM/100ML-% IV SOLN
2.0000 g | INTRAVENOUS | Status: AC
  Administered 2021-05-11: 2 g via INTRAVENOUS
  Filled 2021-05-10: qty 100

## 2021-05-10 MED ORDER — HYDROCERIN EX CREA
TOPICAL_CREAM | Freq: Two times a day (BID) | CUTANEOUS | Status: DC
  Administered 2021-05-11 – 2021-05-16 (×5): 1 via TOPICAL
  Filled 2021-05-10: qty 113

## 2021-05-10 NOTE — Progress Notes (Signed)
Initial Nutrition Assessment  DOCUMENTATION CODES:   Severe malnutrition in context of chronic illness  INTERVENTION:   -Ensure Enlive po BID, each supplement provides 350 kcal and 20 grams of protein   -Juven Fruit Punch BID, each serving provides 95kcal and 2.5g of protein (amino acids glutamine and arginine)  -Multivitamin with minerals daily  NUTRITION DIAGNOSIS:   Severe Malnutrition related to chronic illness (infected rt hip wound) as evidenced by moderate fat depletion, severe muscle depletion, energy intake < or equal to 75% for > or equal to 1 month.  GOAL:   Patient will meet greater than or equal to 90% of their needs  MONITOR:   PO intake, Supplement acceptance, Labs, Weight trends, I & O's, Skin  REASON FOR ASSESSMENT:   Malnutrition Screening Tool, Consult Assessment of nutrition requirement/status  ASSESSMENT:   86 y.o. female with medical history significant for chronic right hip wound, type 2 diabetes mellitus, anemia of chronic disease with baseline hemoglobin 10-12,  who is admitted to [facility] on 05/08/2021 with infected stage IV pressure wound associated with the right hip after presenting to [facility] ED for evaluation purulent drainage from chronic right hip wound.  Patient in room with niece at bedside. Pt states she doesn't eat that well at home, doesn't cook like she used to. Pt eats oatmeal most days for breakfast and states she doesn't eat a lot of meat or dairy so protein is limited. Snacks a little throughout the day.   Doesn't particularly like Ensure but will drink it as well as the Juven for wound healing. Denies issues with swallowing or chewing.   Per weight records, pt's weight has increased since 10/7.  I/Os: +806 ml since admit UOP: 650 ml x 24 hrs  Medications: Multivitamin with minerals daily , Miralax, Ferric gluconate  Labs reviewed:  CBGs: 103-165 Low Na Vit B-12 WNL  NUTRITION - FOCUSED PHYSICAL EXAM:  Flowsheet  Row Most Recent Value  Orbital Region Moderate depletion  Upper Arm Region Severe depletion  Thoracic and Lumbar Region Moderate depletion  Buccal Region Moderate depletion  Temple Region Moderate depletion  Clavicle Bone Region Severe depletion  Clavicle and Acromion Bone Region Severe depletion  Scapular Bone Region Severe depletion  Dorsal Hand Severe depletion  Patellar Region Severe depletion  Anterior Thigh Region Severe depletion  Posterior Calf Region Moderate depletion  Edema (RD Assessment) Mild  Hair Reviewed  Eyes Reviewed  [reports worsening vision] pale conjunctiva  Mouth Reviewed  Skin Reviewed  [dry]  Nails Reviewed       Diet Order:   Diet Order             Diet regular Room service appropriate? Yes; Fluid consistency: Thin  Diet effective now                   EDUCATION NEEDS:   No education needs have been identified at this time  Skin:  Skin Assessment: Skin Integrity Issues: Skin Integrity Issues:: Stage IV Stage IV: right hip  Last BM:  PTA  Height:   Ht Readings from Last 1 Encounters:  05/08/21 5\' 5"  (1.651 m)    Weight:   Wt Readings from Last 1 Encounters:  05/10/21 65.2 kg    BMI:  Body mass index is 23.92 kg/m.  Estimated Nutritional Needs:   Kcal:  1800-2000  Protein:  95-110g  Fluid:  2L/day  Clayton Bibles, MS, RD, LDN Inpatient Clinical Dietitian Contact information available via Amion

## 2021-05-10 NOTE — Consult Note (Signed)
Reason for Consult:right hip wound Referring Physician: Leamon Arnt D Swigart is an 86 y.o. female.  HPI: 86 year old female with approximately one-month history of right hip wound.  She has had some local wound care.  Sounds like things worsened recently and a CT scan shows some air in the wound and possibly some fluid.  I was consult for for possible surgical management  Past Medical History:  Diagnosis Date   CKD (chronic kidney disease) stage 3, GFR 30-59 ml/min ([place]) 04/17/2021    History reviewed. No pertinent surgical history.  History reviewed. No pertinent family history.  Social History:  reports that she has never smoked. She quit smokeless tobacco use about 65 years ago.  Her smokeless tobacco use included snuff and chew. She reports that she does not drink alcohol and does not use drugs.  Allergies:  Allergies  Allergen Reactions   Glipizide Rash    Medications: I have reviewed the patient's current medications.  Results for orders placed or performed during the hospital encounter of 05/08/21 (from the past 48 hour(s))  Comprehensive metabolic panel     Status: Abnormal   Collection Time: 05/08/21  4:25 PM  Result Value Ref Range   Sodium 136 135 - 145 mmol/L   Potassium 4.2 3.5 - 5.1 mmol/L   Chloride 102 98 - 111 mmol/L   CO2 27 22 - 32 mmol/L   Glucose, Bld 185 (H) 70 - 99 mg/dL    Comment: Glucose reference range applies only to samples taken after fasting for at least 8 hours.   BUN 37 (H) 8 - 23 mg/dL   Creatinine, Ser 1.04 (H) 0.44 - 1.00 mg/dL   Calcium 8.5 (L) 8.9 - 10.3 mg/dL   Total Protein 7.2 6.5 - 8.1 g/dL   Albumin 2.4 (L) 3.5 - 5.0 g/dL   AST 18 15 - 41 U/L   ALT 17 0 - 44 U/L   Alkaline Phosphatase 54 38 - 126 U/L   Total Bilirubin 0.5 0.3 - 1.2 mg/dL   GFR, Estimated 53 (L) >60 mL/min    Comment: (NOTE) Calculated using the CKD-EPI Creatinine Equation (2021)    Anion gap 7 5 - 15    Comment: Performed at [facility],  [place] [address] [place], [place] [address]  CBC with Differential     Status: Abnormal   Collection Time: 05/08/21  4:25 PM  Result Value Ref Range   WBC 7.2 4.0 - 10.5 K/uL   RBC 2.96 (L) 3.87 - 5.11 MIL/uL   Hemoglobin 9.2 (L) 12.0 - 15.0 g/dL   HCT 29.6 (L) 36.0 - 46.0 %   MCV 100.0 80.0 - 100.0 fL   MCH 31.1 26.0 - 34.0 pg   MCHC 31.1 30.0 - 36.0 g/dL   RDW 13.3 11.5 - 15.5 %   Platelets 286 150 - 400 K/uL   nRBC 0.0 0.0 - 0.2 %   Neutrophils Relative % 83 %   Neutro Abs 5.9 1.7 - 7.7 K/uL   Lymphocytes Relative 6 %   Lymphs Abs 0.4 (L) 0.7 - 4.0 K/uL   Monocytes Relative 8 %   Monocytes Absolute 0.6 0.1 - 1.0 K/uL   Eosinophils Relative 3 %   Eosinophils Absolute 0.2 0.0 - 0.5 K/uL   Basophils Relative 0 %   Basophils Absolute 0.0 0.0 - 0.1 K/uL   Immature Granulocytes 0 %   Abs Immature Granulocytes 0.03 0.00 - 0.07 K/uL    Comment: Performed at [facility]  [place] [address], [place], [place] [address]  Sedimentation rate     Status: Abnormal   Collection Time: 05/08/21  4:25 PM  Result Value Ref Range   Sed Rate 112 (H) 0 - 22 mm/hr    Comment: Performed at [facility], [place] [address] [place], [place] [address]  C-reactive protein     Status: Abnormal   Collection Time: 05/08/21  4:25 PM  Result Value Ref Range   CRP 13.8 (H) <1.0 mg/dL    Comment: Performed at [facility], [place] [address] [place], [place] [address]  Magnesium     Status: None   Collection Time: 05/08/21  4:25 PM  Result Value Ref Range   Magnesium 2.0 1.7 - 2.4 mg/dL    Comment: Performed at [facility], [place] [address], [place], [place] [address]  Resp Panel by RT-PCR (Flu A&B, Covid) Nasopharyngeal Swab     Status: None   Collection Time: 05/08/21  8:50 PM   Specimen: Nasopharyngeal Swab; Nasopharyngeal(NP) swabs in vial transport medium  Result Value Ref Range   SARS Coronavirus 2 by RT PCR NEGATIVE NEGATIVE     Comment: (NOTE) SARS-CoV-2 target nucleic acids are NOT DETECTED.  The SARS-CoV-2 RNA is generally detectable in upper respiratory specimens during the acute phase of infection. The lowest concentration of SARS-CoV-2 viral copies this assay can detect is 138 copies/mL. A negative result does not preclude SARS-Cov-2 infection and should not be used as the sole basis for treatment or other patient management decisions. A negative result may occur with  improper specimen collection/handling, submission of specimen other than nasopharyngeal swab, presence of viral mutation(s) within the areas targeted by this assay, and inadequate number of viral copies(<138 copies/mL). A negative result must be combined with clinical observations, patient history, and epidemiological information. The expected result is Negative.  Fact Sheet for Patients:  EntrepreneurPulse.com.au  Fact Sheet for Healthcare Providers:  IncredibleEmployment.be  This test is no t yet approved or cleared by the [place] FDA and  has been authorized for detection and/or diagnosis of SARS-CoV-2 by FDA under an Emergency Use Authorization (EUA). This EUA will remain  in effect (meaning this test can be used) for the duration of the COVID-19 declaration under Section 564(b)(1) of the Act, 21 U.S.C.section 360bbb-3(b)(1), unless the authorization is terminated  or revoked sooner.       Influenza A by PCR NEGATIVE NEGATIVE   Influenza B by PCR NEGATIVE NEGATIVE    Comment: (NOTE) The Xpert Xpress SARS-CoV-2/FLU/RSV plus assay is intended as an aid in the diagnosis of influenza from Nasopharyngeal swab specimens and should not be used as a sole basis for treatment. Nasal washings and aspirates are unacceptable for Xpert Xpress SARS-CoV-2/FLU/RSV testing.  Fact Sheet for Patients: EntrepreneurPulse.com.au  Fact Sheet for Healthcare  Providers: IncredibleEmployment.be  This test is not yet approved or cleared by the [place] FDA and has been authorized for detection and/or diagnosis of SARS-CoV-2 by FDA under an Emergency Use Authorization (EUA). This EUA will remain in effect (meaning this test can be used) for the duration of the COVID-19 declaration under Section 564(b)(1) of the Act, 21 U.S.C. section 360bbb-3(b)(1), unless the authorization is terminated or revoked.  Performed at [facility], [place] [address] [place], [place] [address]   Glucose, capillary     Status: Abnormal   Collection Time: 05/08/21  9:22 PM  Result Value Ref Range   Glucose-Capillary 181 (  H) 70 - 99 mg/dL    Comment: Glucose reference range applies only to samples taken after fasting for at least 8 hours.  Magnesium     Status: None   Collection Time: 05/09/21  5:44 AM  Result Value Ref Range   Magnesium 1.8 1.7 - 2.4 mg/dL    Comment: Performed at [facility], [place] [address], [place], [place] [address]  Comprehensive metabolic panel     Status: Abnormal   Collection Time: 05/09/21  5:44 AM  Result Value Ref Range   Sodium 137 135 - 145 mmol/L   Potassium 3.9 3.5 - 5.1 mmol/L   Chloride 106 98 - 111 mmol/L   CO2 27 22 - 32 mmol/L   Glucose, Bld 152 (H) 70 - 99 mg/dL    Comment: Glucose reference range applies only to samples taken after fasting for at least 8 hours.   BUN 31 (H) 8 - 23 mg/dL   Creatinine, Ser 0.97 0.44 - 1.00 mg/dL   Calcium 8.2 (L) 8.9 - 10.3 mg/dL   Total Protein 6.2 (L) 6.5 - 8.1 g/dL   Albumin 2.1 (L) 3.5 - 5.0 g/dL   AST 14 (L) 15 - 41 U/L   ALT 15 0 - 44 U/L   Alkaline Phosphatase 38 38 - 126 U/L   Total Bilirubin 0.4 0.3 - 1.2 mg/dL   GFR, Estimated 58 (L) >60 mL/min    Comment: (NOTE) Calculated using the CKD-EPI Creatinine Equation (2021)    Anion gap 4 (L) 5 - 15    Comment: Performed at [facility], [place]  [address], [place], [place] [address]  CBC with Differential/Platelet     Status: Abnormal   Collection Time: 05/09/21  5:44 AM  Result Value Ref Range   WBC 4.5 4.0 - 10.5 K/uL   RBC 2.53 (L) 3.87 - 5.11 MIL/uL   Hemoglobin 7.9 (L) 12.0 - 15.0 g/dL   HCT 25.5 (L) 36.0 - 46.0 %   MCV 100.8 (H) 80.0 - 100.0 fL   MCH 31.2 26.0 - 34.0 pg   MCHC 31.0 30.0 - 36.0 g/dL   RDW 13.2 11.5 - 15.5 %   Platelets 234 150 - 400 K/uL   nRBC 0.0 0.0 - 0.2 %   Neutrophils Relative % 72 %   Neutro Abs 3.2 1.7 - 7.7 K/uL   Lymphocytes Relative 12 %   Lymphs Abs 0.5 (L) 0.7 - 4.0 K/uL   Monocytes Relative 10 %   Monocytes Absolute 0.5 0.1 - 1.0 K/uL   Eosinophils Relative 6 %   Eosinophils Absolute 0.3 0.0 - 0.5 K/uL   Basophils Relative 0 %   Basophils Absolute 0.0 0.0 - 0.1 K/uL   Immature Granulocytes 0 %   Abs Immature Granulocytes 0.01 0.00 - 0.07 K/uL    Comment: Performed at [facility], [place] [address] [place], [place] [address]  Protime-INR     Status: None   Collection Time: 05/09/21  5:44 AM  Result Value Ref Range   Prothrombin Time 14.7 11.4 - 15.2 seconds   INR 1.2 0.8 - 1.2    Comment: (NOTE) INR goal varies based on device and disease states. Performed at [facility], [place] [address], [place], [place] [address]   Iron and TIBC     Status: Abnormal   Collection Time: 05/09/21  5:45 AM  Result Value Ref Range   Iron 17 (L) 28 - 170 ug/dL   TIBC  188 (L) 250 - 450 ug/dL   Saturation Ratios 9 (L) 10.4 - 31.8 %   UIBC 171 ug/dL    Comment: Performed at [facility], [place] [address], [place], [place] [address]  Reticulocytes     Status: Abnormal   Collection Time: 05/09/21  5:45 AM  Result Value Ref Range   Retic Ct Pct 1.5 0.4 - 3.1 %   RBC. 2.49 (L) 3.87 - 5.11 MIL/uL   Retic Count, Absolute 37.4 19.0 - 186.0 K/uL   Immature Retic Fract 14.3 2.3 - 15.9 %    Comment: Performed at [facility], [place]  [address] [place], [place] [address]  Folate     Status: None   Collection Time: 05/09/21  5:45 AM  Result Value Ref Range   Folate 10.6 >5.9 ng/mL    Comment: Performed at [facility], [place] [address], [place], [place] [address]  Ferritin     Status: None   Collection Time: 05/09/21  5:45 AM  Result Value Ref Range   Ferritin 51 11 - 307 ng/mL    Comment: Performed at [facility], [place] [address], [place], [place] [address]  Glucose, capillary     Status: None   Collection Time: 05/09/21  7:59 AM  Result Value Ref Range   Glucose-Capillary 96 70 - 99 mg/dL    Comment: Glucose reference range applies only to samples taken after fasting for at least 8 hours.  Glucose, capillary     Status: Abnormal   Collection Time: 05/09/21 11:56 AM  Result Value Ref Range   Glucose-Capillary 139 (H) 70 - 99 mg/dL    Comment: Glucose reference range applies only to samples taken after fasting for at least 8 hours.  Glucose, capillary     Status: Abnormal   Collection Time: 05/09/21  4:15 PM  Result Value Ref Range   Glucose-Capillary 167 (H) 70 - 99 mg/dL    Comment: Glucose reference range applies only to samples taken after fasting for at least 8 hours.  Glucose, capillary     Status: Abnormal   Collection Time: 05/09/21  8:17 PM  Result Value Ref Range   Glucose-Capillary 165 (H) 70 - 99 mg/dL    Comment: Glucose reference range applies only to samples taken after fasting for at least 8 hours.  Vitamin B12     Status: None   Collection Time: 05/10/21  4:58 AM  Result Value Ref Range   Vitamin B-12 495 180 - 914 pg/mL    Comment: (NOTE) This assay is not validated for testing neonatal or myeloproliferative syndrome specimens for Vitamin B12 levels. Performed at [facility], [place] [address], [place], [place] [address]   CBC with Differential/Platelet     Status: Abnormal   Collection Time: 05/10/21  4:58 AM  Result Value Ref Range    WBC 4.6 4.0 - 10.5 K/uL   RBC 2.50 (L) 3.87 - 5.11 MIL/uL   Hemoglobin 7.7 (L) 12.0 - 15.0 g/dL   HCT 25.7 (L) 36.0 - 46.0 %   MCV 102.8 (H) 80.0 - 100.0 fL   MCH 30.8 26.0 - 34.0 pg   MCHC 30.0 30.0 - 36.0 g/dL   RDW 13.2 11.5 - 15.5 %   Platelets 218 150 - 400 K/uL   nRBC 0.0 0.0 - 0.2 %   Neutrophils Relative % 68 %   Neutro Abs 3.2 1.7 - 7.7 K/uL   Lymphocytes Relative 13 %  Lymphs Abs 0.6 (L) 0.7 - 4.0 K/uL   Monocytes Relative 13 %   Monocytes Absolute 0.6 0.1 - 1.0 K/uL   Eosinophils Relative 6 %   Eosinophils Absolute 0.3 0.0 - 0.5 K/uL   Basophils Relative 0 %   Basophils Absolute 0.0 0.0 - 0.1 K/uL   Immature Granulocytes 0 %   Abs Immature Granulocytes 0.02 0.00 - 0.07 K/uL    Comment: Performed at [facility], [place] [address] [place], [place] [address]  Basic metabolic panel     Status: Abnormal   Collection Time: 05/10/21  4:58 AM  Result Value Ref Range   Sodium 134 (L) 135 - 145 mmol/L   Potassium 4.3 3.5 - 5.1 mmol/L   Chloride 106 98 - 111 mmol/L   CO2 22 22 - 32 mmol/L   Glucose, Bld 113 (H) 70 - 99 mg/dL    Comment: Glucose reference range applies only to samples taken after fasting for at least 8 hours.   BUN 36 (H) 8 - 23 mg/dL   Creatinine, Ser 1.11 (H) 0.44 - 1.00 mg/dL   Calcium 7.9 (L) 8.9 - 10.3 mg/dL   GFR, Estimated 49 (L) >60 mL/min    Comment: (NOTE) Calculated using the CKD-EPI Creatinine Equation (2021)    Anion gap 6 5 - 15    Comment: Performed at [facility], [place] [address] [place],  [address]  Glucose, capillary     Status: Abnormal   Collection Time: 05/10/21  7:57 AM  Result Value Ref Range   Glucose-Capillary 103 (H) 70 - 99 mg/dL    Comment: Glucose reference range applies only to samples taken after fasting for at least 8 hours.   Comment 1 Notify RN   Glucose, capillary     Status: Abnormal   Collection Time: 05/10/21 12:16 PM  Result Value Ref Range   Glucose-Capillary 141 (H)  70 - 99 mg/dL    Comment: Glucose reference range applies only to samples taken after fasting for at least 8 hours.   Comment 1 Notify RN     CT FEMUR RIGHT WO CONTRAST  Result Date: 05/10/2021 CLINICAL DATA:  Suspected osteomyelitis EXAM: CT OF THE LOWER RIGHT EXTREMITY WITHOUT CONTRAST TECHNIQUE: Multidetector CT imaging of the right lower extremity was performed according to the standard protocol. COMPARISON:  Radiographs dated May 08, 2021 FINDINGS: Bones/Joint/Cartilage No cortical erosion or periosteal reaction concerning for osteomyelitis. Advanced right hip osteoarthritis with bone on bone articulation and prominent osteophytes. Advanced right knee osteoarthritis. No acute fracture or suspicious osseous lesion Ligaments Suboptimally assessed by CT. Muscles and Tendons No acute abnormality. Soft tissues Deep skin wound in the posterolateral gluteal region with a gas containing soft tissue collection measuring approximately 3.5 x 2.0 x 8.3 cm (AP x TR x CC). Generalized subcutaneous soft tissue edema of the right lower extremity. IMPRESSION: 1. Gas containing soft tissue collection in the posterolateral gluteal region measuring at least 3.5 x 2.0 x 8.3 cm (AP x TR x CC). Surgical consultation for further management could be helpful. 2.  No CT evidence of acute osteomyelitis. 3.  Advanced right hip and right knee osteoarthritis. Electronically Signed   By: Keane Police D.O.   On: 05/10/2021 07:42   MR HIP RIGHT WO CONTRAST  Result Date: 05/08/2021 CLINICAL DATA:  Chronic ulcer to right hip EXAM: MR OF THE RIGHT HIP WITHOUT CONTRAST TECHNIQUE: Multiplanar, multisequence MR imaging was performed. No intravenous contrast was administered. Examination was terminated  after only 3 series were obtained secondary to patient pain. COMPARISON:  Radiograph 05/08/2021 FINDINGS: All 3 sequences are significantly degraded by patient motion. Bones: Heterogeneous marrow signal. There may be edema within the  sacrococcygeal region on limited axial views. Small focal area of edema within the right superior pubic ramus complete loss of the hip joint space with bone on bone appearance and remodeling of the femoral heads. Probable left supra-acetabular cyst. Articular cartilage and labrum Articular cartilage:  Nondiagnostic Labrum:  Nondiagnostic Joint or bursal effusion Joint effusion:  No gross hip effusions. Bursae: No gross bursal distension Muscles and tendons Muscles and tendons:  Generalized atrophy Other findings Miscellaneous: The patient's right hip soft tissue ulcer is only partially imaged on coronal views. There is a ulcer within the right lateral hip soft tissues. Abundant subcutaneous edema within the partially visualized right hip soft tissues laterally. There is probable edema within the lateral gluteus muscles as well. No gross focal fluid collection. IMPRESSION: 1. Nearly nondiagnostic study as only limited sequences were obtained, all significantly degraded by patient motion. In addition, the patient's right hip ulcer is incompletely included in the field of view. 2. Generalized soft tissue edema within the lateral hip soft tissues with suspected edema in the gluteus muscles. No gross marrow edema within the underlying femur allowing for excessive motion. 3. Nonspecific foci of suspected edema within the sacrococcygeal region and right superior pubic ramus. 4. Severe arthritis of the bilateral hips without gross hip effusion. Electronically Signed   By: Donavan Foil M.D.   On: 05/08/2021 21:40   DG Hip Unilat W or Wo Pelvis 2-3 Views Right  Result Date: 05/08/2021 CLINICAL DATA:  Pressure ulcer right hip and buttock EXAM: DG HIP (WITH OR WITHOUT PELVIS) 2-3V RIGHT COMPARISON:  04/17/2021 FINDINGS: No fracture or malalignment. 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 Bone on bone appearance with sclerosis. Gas collection within the right hip soft tissues  presumably related to the history of wound or ulcer. Pubic symphysis and rami appear intact. IMPRESSION: 1. Gas collection within the right hip soft tissues presumably related to the history of wound or ulcer. No definite acute osseous abnormality but would correlate with CT or MRI if continued concern. 2. Severe arthritis of the bilateral hips. Electronically Signed   By: Donavan Foil M.D.   On: 05/08/2021 17:52   DG FEMUR PORT, MIN 2 VIEWS RIGHT  Result Date: 05/09/2021 CLINICAL DATA:  Osteomyelitis of femur. EXAM: RIGHT FEMUR PORTABLE 2 VIEW COMPARISON:  MRI hip and right hip x-ray 05/08/2021 FINDINGS: No evidence for acute fracture. No cortical erosions are identified. Again seen are severe/end-stage degenerative changes of the right hip with joint space narrowing and osteophyte formation. There also moderate severe degenerative changes of the right knee with severe medial compartment and patellofemoral compartment joint space narrowing and tricompartmental osteophyte formation. Soft tissue swelling and air lateral to the right hip has slightly decreased. There are no new areas of soft tissue air. Vascular calcifications are seen the soft tissues. IMPRESSION: 1. No acute bony abnormality. 2. Soft tissue swelling and air in the right lateral hip have decreased. No new areas of soft tissue air. 3. Degenerative changes of the right knee and right hip appear stable. Electronically Signed   By: Ronney Asters M.D.   On: 05/09/2021 19:29    Review of Systems  All other systems reviewed and are negative. Blood pressure 135/75, pulse 61, temperature 97.9 F (36.6 C), temperature source  Oral, resp. rate 20, height 5\' 5"  (1.651 m), weight 65.2 kg, SpO2 100 %. Physical Exam HENT:     Head: Atraumatic.  Eyes:     Extraocular Movements: Extraocular movements intact.  Cardiovascular:     Pulses: Normal pulses.  Pulmonary:     Effort: Pulmonary effort is normal.  Musculoskeletal:     Comments: Right  posterior hip with approximately 4 x 3 cm open wound with exposed underlying muscle.  There is some packing in the wound which I removed.  The wound does probe deeply.  There is no current drainage.  The surrounding skin is healthy appearing.  There is no significant surrounding cellulitis.  Skin:    General: Skin is warm.  Neurological:     Mental Status: She is alert.  Psychiatric:        Mood and Affect: Mood normal.    Assessment/Plan: Deep right hip wound She would likely benefit from surgical debridement with placement of a wound VAC.  Following the initial surgical debridement she should be able to simply be followed with serial wound VAC changes and local wound care by the wound team.   I will plan on taking her to the operating room tomorrow morning for debridement and wound VAC placement. She should remain n.p.o. after midnight.  I will speak to her niece via telephone.  Rhae Hammock 05/10/2021, 12:47 PM

## 2021-05-10 NOTE — Evaluation (Signed)
Occupational Therapy Evaluation Patient Details Name: Penny [name] MRN: [id number] DOB: April 23, 1935 Today's Date: 05/10/2021   History of Present Illness Patient is a 86 year old female who presented with worsening right thigh wound from SNF where patient was getting short term rehab. patient was also found to be dehydrated. PMH: type II DM, cog impariment, anemia, 10/3-10/7 hypothermia.   Clinical Impression   Patient is a 86 year old female who was admitted for above. Patient was previously living at home alone with RW for ADLs. Patient was noted to have continued decreased safety awareness, decreased endurance, decreased standing balance and sitting balance, decreased hip flexion, and increased itchiness impacting participation in ADLs. Patient would continue to benefit from skilled OT services at this time while admitted and after d/c to address noted deficits in order to improve overall safety and independence in ADLs.        Recommendations for follow up therapy are one component of a multi-disciplinary discharge planning process, led by the attending physician.  Recommendations may be updated based on patient status, additional functional criteria and insurance authorization.   Follow Up Recommendations  Skilled nursing-short term rehab (<3 hours/day)    Assistance Recommended at Discharge Frequent or constant Supervision/Assistance  Functional Status Assessment     Equipment Recommendations  None recommended by OT    Recommendations for Other Services       Precautions / Restrictions Precautions Precautions: Fall Restrictions Weight Bearing Restrictions: No      Mobility Bed Mobility               General bed mobility comments: patient was up in recliner at start of session    Transfers Overall transfer level: Needs assistance Equipment used: Rolling walker (2 wheels) Transfers: Sit to/from Stand Sit to Stand: Min assist                  Balance  Overall balance assessment: Needs assistance     Sitting balance - Comments: patient was noted to have diffiuclt with maintaining sitting balance in recliner. patient was noted to need UE support to maitnain BLE on floor. when UE support was removed patients BLE would come off the floor and trunk would tip backwards.   Standing balance support: During functional activity;Single extremity supported Standing balance-Leahy Scale: Fair                             ADL either performed or assessed with clinical judgement   ADL Overall ADL's : Needs assistance/impaired Eating/Feeding: Set up;Sitting   Grooming: Wash/dry hands;Wash/dry face;Supervision/safety;Cueing for sequencing;Sitting   Upper Body Bathing: Sitting;Minimal assistance Upper Body Bathing Details (indicate cue type and reason): patient reported getting washed up with nursing this AM. Lower Body Bathing: Sit to/from stand;Minimal assistance;Set up Lower Body Bathing Details (indicate cue type and reason): patient was noted to have min guard while standing with one UE support with patietn able to simlulate washing thights and reaching to prefomr hygiene with min A Upper Body Dressing : Minimal assistance;Sitting   Lower Body Dressing: Moderate assistance;Sit to/from stand Lower Body Dressing Details (indicate cue type and reason): patient is max A for donning/doffing socks. patient reported using AE at facility. Toilet Transfer: Minimal assistance;Rolling walker (2 wheels);Ambulation Toilet Transfer Details (indicate cue type and reason): patient was noted to have limited hip flexion impacting ability to transition sit to stand in typical sequence. patient is able to make sit to stand  happen with own compensatory strategies. Toileting- Clothing Manipulation and Hygiene: Sit to/from stand;Minimal assistance       Functional mobility during ADLs: Minimal assistance;Rolling walker (2 wheels)       Vision Baseline  Vision/History: 1 Wears glasses Patient Visual Report: No change from baseline       Perception     Praxis      Pertinent Vitals/Pain Pain Assessment: Faces Faces Pain Scale: Hurts a little bit Pain Location: "scratched area" wounds Pain Intervention(s): Monitored during session;Repositioned     Hand Dominance Right   Extremity/Trunk Assessment Upper Extremity Assessment Upper Extremity Assessment: Overall WFL for tasks assessed (patient was noted to have 5/5 strength in BUE with good grasp and ROM of BUE.)   Lower Extremity Assessment Lower Extremity Assessment: Defer to PT evaluation   Cervical / Trunk Assessment Cervical / Trunk Assessment: Kyphotic   Communication Communication Communication: No difficulties   Cognition Arousal/Alertness: Awake/alert Behavior During Therapy: WFL for tasks assessed/performed Overall Cognitive Status: Impaired/Different from baseline Area of Impairment: Orientation;Attention;Problem solving                 Orientation Level: Person;Place Current Attention Level: Focused;Sustained Memory: Decreased short-term memory;Decreased recall of precautions Following Commands: Follows one step commands inconsistently;Follows one step commands with increased time Safety/Judgement: Decreased awareness of safety;Decreased awareness of deficits Awareness: Intellectual Problem Solving: Slow processing;Difficulty sequencing;Requires verbal cues;Requires tactile cues       General Comments       Exercises     Shoulder Instructions      Home Living Family/patient expects to be discharged to:: Skilled nursing facility Living Arrangements: Alone Available Help at Discharge: Family;Friend(s);Available PRN/intermittently Type of Home: Apartment Home Access: Elevator     Home Layout: One level     Bathroom Shower/Tub: Teacher, early years/pre: Standard     Home Equipment: Conservation officer, nature (2 wheels);Wheelchair Programmer, applications (4 wheels);Shower seat;Grab bars - toilet;Grab bars - tub/shower   Additional Comments: Pt lives in [place] [address] senior citizens apartments      Prior Functioning/Environment Prior Level of Function : Independent/Modified Independent             Mobility Comments: patient was using RW for mobility. friends take patient to groceries and MD appointments. ADLs Comments: patient reported being independent in ADLs at [facility] level.        OT Problem List: Decreased strength;Decreased activity tolerance;Impaired balance (sitting and/or standing);Decreased cognition;Decreased safety awareness;Decreased knowledge of use of DME or AE      OT Treatment/Interventions: Self-care/ADL training;Therapeutic exercise;Neuromuscular education;DME and/or AE instruction;Therapeutic activities;Cognitive remediation/compensation;Patient/family education;Balance training    OT Goals(Current goals can be found in the care plan section) Acute Rehab OT Goals Patient Stated Goal: to stop itching OT Goal Formulation: With patient Time For Goal Achievement: 05/24/21 Potential to Achieve Goals: Good  OT Frequency: Min 2X/week   Barriers to D/C: Decreased caregiver support  patient lives in senior apartments alone. patient does not have apartment anymore per patient report?       Co-evaluation PT/OT/SLP Co-Evaluation/Treatment: Yes Reason for Co-Treatment: To address functional/ADL transfers PT goals addressed during session: Mobility/safety with mobility OT goals addressed during session: ADL's and self-care      AM-PAC OT "6 Clicks" Daily Activity     Outcome Measure Help from another person eating meals?: A Little Help from another person taking care of personal grooming?: A Little Help from another person toileting, which includes using toliet, bedpan, or urinal?: A Little  Help from another person bathing (including washing, rinsing, drying)?: A Lot Help from another person  to put on and taking off regular upper body clothing?: A Little Help from another person to put on and taking off regular lower body clothing?: A Lot 6 Click Score: 16   End of Session Equipment Utilized During Treatment: Gait belt;Rolling walker (2 wheels)  Activity Tolerance: Patient tolerated treatment well Patient left: in bed;with call bell/phone within reach;with bed alarm set  OT Visit Diagnosis: Unsteadiness on feet (R26.81);Cognitive communication deficit (R41.841)                Time: 1364-3837 OT Time Calculation (min): 14 min Charges:  OT General Charges $OT Visit: 1 Visit OT Evaluation $OT Eval Low Complexity: 1 Low  Jackelyn Poling OTR/L, MS Acute Rehabilitation Department Office# [phone] Pager# [phone]   [place] 05/10/2021, 1:42 PM

## 2021-05-10 NOTE — Evaluation (Signed)
Physical Therapy Evaluation Patient Details Name: Penny [name] MRN: [id number] DOB: 24-Jul-1934 Today's Date: 05/10/2021  History of Present Illness  Patient is a 86 year old female who presented with worsening right thigh wound from SNF where patient was getting short term rehab. patient was also found to be dehydrated. PMH: type II DM, cog impariment, anemia, 10/3-10/7 hypothermia.  Clinical Impression   The patient  able to ambulate in room x 30' with Rw and Min guard.  Patient has limitations in Knee and hip ROM but able to  work to stand, pushing /with UE's. Patient  will benfit from return to SNF. Pt admitted with above diagnosis.  Pt currently with functional limitations due to the deficits listed below (see PT Problem List). Pt will benefit from skilled PT to increase their independence and safety with mobility to allow discharge to the venue listed below.  '        Recommendations for follow up therapy are one component of a multi-disciplinary discharge planning process, led by the attending physician.  Recommendations may be updated based on patient status, additional functional criteria and insurance authorization.  Follow Up Recommendations Skilled nursing-short term rehab (<3 hours/day)    Assistance Recommended at Discharge None  Functional Status Assessment Patient has not had a recent decline in their functional status  Equipment Recommendations  None recommended by PT    Recommendations for Other Services       Precautions / Restrictions Precautions Precautions: Fall Restrictions Weight Bearing Restrictions: No      Mobility  Bed Mobility               General bed mobility comments: patient was up in recliner at start of session    Transfers Overall transfer level: Needs assistance Equipment used: Rolling walker (2 wheels) Transfers: Sit to/from Stand Sit to Stand: Min assist           General transfer comment: patient able to walk arms on  armrests and get to standing with decreased knees flexed . decreased descent to sitting down.    Ambulation/Gait Ambulation/Gait assistance: Min guard Gait Distance (Feet): 30 Feet Assistive device: Rolling walker (2 wheels) Gait Pattern/deviations: Step-through pattern Gait velocity: decr   General Gait Details: Patient manages RW ambulation around room.  Stairs            Wheelchair Mobility    Modified Rankin (Stroke Patients Only)       Balance Overall balance assessment: Needs assistance Sitting-balance support: Feet unsupported;Single extremity supported Sitting balance-Leahy Scale: Poor Sitting balance - Comments: difficulty leaning forward when sitting. Postural control: Posterior lean Standing balance support: During functional activity;Single extremity supported Standing balance-Leahy Scale: Fair Standing balance comment: with RW                             Pertinent Vitals/Pain Faces Pain Scale: Hurts a little bit Pain Location: "scratched area" wounds Pain Descriptors / Indicators: Aching Pain Intervention(s): Monitored during session    Home Living Family/patient expects to be discharged to:: Skilled nursing facility                   Additional Comments: Looking into  LT care at a facility. comes from [facility].    Prior Function               Mobility Comments: patient was using RW for mobility at SNF.  Hand Dominance        Extremity/Trunk Assessment   Upper Extremity Assessment Upper Extremity Assessment:  (both knees are tight to flex to stand)    Lower Extremity Assessment Lower Extremity Assessment: Defer to PT evaluation RLE Deficits / Details: attained approx 50* knee flexion bilaterally actively assisting,  bil 90* actively sitting EOB LLE Deficits / Details: similar to R LE    Cervical / Trunk Assessment Cervical / Trunk Assessment: Kyphotic  Communication      Cognition  Arousal/Alertness: Awake/alert Behavior During Therapy: WFL for tasks assessed/performed Overall Cognitive Status: Impaired/Different from baseline Area of Impairment: Orientation;Attention;Problem solving                     Memory: Decreased short-term memory;Decreased recall of precautions Following Commands: Follows one step commands inconsistently;Follows one step commands with increased time                General Comments      Exercises     Assessment/Plan    PT Assessment Patient needs continued PT services  PT Problem List Decreased strength       PT Treatment Interventions Gait training;Functional mobility training;Therapeutic activities;Therapeutic exercise;Balance training;Patient/family education;DME instruction    PT Goals (Current goals can be found in the Care Plan section)  Acute Rehab PT Goals Patient Stated Goal: to get up PT Goal Formulation: With patient Time For Goal Achievement: 05/24/21 Potential to Achieve Goals: Good    Frequency Min 2X/week   Barriers to discharge        Co-evaluation PT/OT/SLP Co-Evaluation/Treatment: Yes Reason for Co-Treatment: To address functional/ADL transfers PT goals addressed during session: Mobility/safety with mobility OT goals addressed during session: ADL's and self-care       AM-PAC PT "6 Clicks" Mobility  Outcome Measure Help needed turning from your back to your side while in a flat bed without using bedrails?: A Lot Help needed moving from lying on your back to sitting on the side of a flat bed without using bedrails?: A Lot Help needed moving to and from a bed to a chair (including a wheelchair)?: A Lot Help needed standing up from a chair using your arms (e.g., wheelchair or bedside chair)?: A Lot Help needed to walk in hospital room?: A Lot Help needed climbing 3-5 steps with a railing? : Total 6 Click Score: 11    End of Session Equipment Utilized During Treatment: Gait  belt Activity Tolerance: Patient tolerated treatment well Patient left: in chair Nurse Communication: Mobility status PT Visit Diagnosis: Other abnormalities of gait and mobility (R26.89);Muscle weakness (generalized) (M62.81);Difficulty in walking, not elsewhere classified (R26.2);Pain Pain - Right/Left: Right Pain - part of body: Shoulder;Hip    Time: 7793-9030 PT Time Calculation (min) (ACUTE ONLY): 14 min   Charges:   PT Evaluation $PT Eval Low Complexity: [facility] Acute Rehabilitation Services Pager [phone] Office [phone]   Claretha Cooper 05/10/2021, 5:17 PM

## 2021-05-10 NOTE — H&P (View-Only) (Signed)
Reason for Consult:right hip wound Referring Physician: [name] is an 86 y.o. female.  HPI: 86 year old female with approximately one-month history of right hip wound.  She has had some local wound care.  Sounds like things worsened recently and a CT scan shows some air in the wound and possibly some fluid.  I was consult for for possible surgical management  Past Medical History:  Diagnosis Date   CKD (chronic kidney disease) stage 3, GFR 30-59 ml/min ([place]) 04/17/2021    History reviewed. No pertinent surgical history.  History reviewed. No pertinent family history.  Social History:  reports that she has never smoked. She quit smokeless tobacco use about 65 years ago.  Her smokeless tobacco use included snuff and chew. She reports that she does not drink alcohol and does not use drugs.  Allergies:  Allergies  Allergen Reactions   Glipizide Rash    Medications: I have reviewed the patient's current medications.  Results for orders placed or performed during the hospital encounter of 05/08/21 (from the past 48 hour(s))  Comprehensive metabolic panel     Status: Abnormal   Collection Time: 05/08/21  4:25 PM  Result Value Ref Range   Sodium 136 135 - 145 mmol/L   Potassium 4.2 3.5 - 5.1 mmol/L   Chloride 102 98 - 111 mmol/L   CO2 27 22 - 32 mmol/L   Glucose, Bld 185 (H) 70 - 99 mg/dL    Comment: Glucose reference range applies only to samples taken after fasting for at least 8 hours.   BUN 37 (H) 8 - 23 mg/dL   Creatinine, Ser 1.04 (H) 0.44 - 1.00 mg/dL   Calcium 8.5 (L) 8.9 - 10.3 mg/dL   Total Protein 7.2 6.5 - 8.1 g/dL   Albumin 2.4 (L) 3.5 - 5.0 g/dL   AST 18 15 - 41 U/L   ALT 17 0 - 44 U/L   Alkaline Phosphatase 54 38 - 126 U/L   Total Bilirubin 0.5 0.3 - 1.2 mg/dL   GFR, Estimated 53 (L) >60 mL/min    Comment: (NOTE) Calculated using the CKD-EPI Creatinine Equation (2021)    Anion gap 7 5 - 15    Comment: Performed at [facility],  [place] [address], [place], [place] [address]  CBC with Differential     Status: Abnormal   Collection Time: 05/08/21  4:25 PM  Result Value Ref Range   WBC 7.2 4.0 - 10.5 K/uL   RBC 2.96 (L) 3.87 - 5.11 MIL/uL   Hemoglobin 9.2 (L) 12.0 - 15.0 g/dL   HCT 29.6 (L) 36.0 - 46.0 %   MCV 100.0 80.0 - 100.0 fL   MCH 31.1 26.0 - 34.0 pg   MCHC 31.1 30.0 - 36.0 g/dL   RDW 13.3 11.5 - 15.5 %   Platelets 286 150 - 400 K/uL   nRBC 0.0 0.0 - 0.2 %   Neutrophils Relative % 83 %   Neutro Abs 5.9 1.7 - 7.7 K/uL   Lymphocytes Relative 6 %   Lymphs Abs 0.4 (L) 0.7 - 4.0 K/uL   Monocytes Relative 8 %   Monocytes Absolute 0.6 0.1 - 1.0 K/uL   Eosinophils Relative 3 %   Eosinophils Absolute 0.2 0.0 - 0.5 K/uL   Basophils Relative 0 %   Basophils Absolute 0.0 0.0 - 0.1 K/uL   Immature Granulocytes 0 %   Abs Immature Granulocytes 0.03 0.00 - 0.07 K/uL    Comment: Performed at [facility]  [place] [address], [place], [place] [address]  Sedimentation rate     Status: Abnormal   Collection Time: 05/08/21  4:25 PM  Result Value Ref Range   Sed Rate 112 (H) 0 - 22 mm/hr    Comment: Performed at [facility], [place] [address], [place], [place] [address]  C-reactive protein     Status: Abnormal   Collection Time: 05/08/21  4:25 PM  Result Value Ref Range   CRP 13.8 (H) <1.0 mg/dL    Comment: Performed at [facility], [place] [address] [place], [place] [address]  Magnesium     Status: None   Collection Time: 05/08/21  4:25 PM  Result Value Ref Range   Magnesium 2.0 1.7 - 2.4 mg/dL    Comment: Performed at [facility], [place] [address], [place], [place] [address]  Resp Panel by RT-PCR (Flu A&B, Covid) Nasopharyngeal Swab     Status: None   Collection Time: 05/08/21  8:50 PM   Specimen: Nasopharyngeal Swab; Nasopharyngeal(NP) swabs in vial transport medium  Result Value Ref Range   SARS Coronavirus 2 by RT PCR NEGATIVE NEGATIVE     Comment: (NOTE) SARS-CoV-2 target nucleic acids are NOT DETECTED.  The SARS-CoV-2 RNA is generally detectable in upper respiratory specimens during the acute phase of infection. The lowest concentration of SARS-CoV-2 viral copies this assay can detect is 138 copies/mL. A negative result does not preclude SARS-Cov-2 infection and should not be used as the sole basis for treatment or other patient management decisions. A negative result may occur with  improper specimen collection/handling, submission of specimen other than nasopharyngeal swab, presence of viral mutation(s) within the areas targeted by this assay, and inadequate number of viral copies(<138 copies/mL). A negative result must be combined with clinical observations, patient history, and epidemiological information. The expected result is Negative.  Fact Sheet for Patients:  EntrepreneurPulse.com.au  Fact Sheet for Healthcare Providers:  IncredibleEmployment.be  This test is no t yet approved or cleared by the [place] FDA and  has been authorized for detection and/or diagnosis of SARS-CoV-2 by FDA under an Emergency Use Authorization (EUA). This EUA will remain  in effect (meaning this test can be used) for the duration of the COVID-19 declaration under Section 564(b)(1) of the Act, 21 U.S.C.section 360bbb-3(b)(1), unless the authorization is terminated  or revoked sooner.       Influenza A by PCR NEGATIVE NEGATIVE   Influenza B by PCR NEGATIVE NEGATIVE    Comment: (NOTE) The Xpert Xpress SARS-CoV-2/FLU/RSV plus assay is intended as an aid in the diagnosis of influenza from Nasopharyngeal swab specimens and should not be used as a sole basis for treatment. Nasal washings and aspirates are unacceptable for Xpert Xpress SARS-CoV-2/FLU/RSV testing.  Fact Sheet for Patients: EntrepreneurPulse.com.au  Fact Sheet for Healthcare  Providers: IncredibleEmployment.be  This test is not yet approved or cleared by the [place] FDA and has been authorized for detection and/or diagnosis of SARS-CoV-2 by FDA under an Emergency Use Authorization (EUA). This EUA will remain in effect (meaning this test can be used) for the duration of the COVID-19 declaration under Section 564(b)(1) of the Act, 21 U.S.C. section 360bbb-3(b)(1), unless the authorization is terminated or revoked.  Performed at [facility], [place] [address], [place], [place] [address]   Glucose, capillary     Status: Abnormal   Collection Time: 05/08/21  9:22 PM  Result Value Ref Range   Glucose-Capillary 181 (  H) 70 - 99 mg/dL    Comment: Glucose reference range applies only to samples taken after fasting for at least 8 hours.  Magnesium     Status: None   Collection Time: 05/09/21  5:44 AM  Result Value Ref Range   Magnesium 1.8 1.7 - 2.4 mg/dL    Comment: Performed at [facility], [place] [address], [place], [place] [address]  Comprehensive metabolic panel     Status: Abnormal   Collection Time: 05/09/21  5:44 AM  Result Value Ref Range   Sodium 137 135 - 145 mmol/L   Potassium 3.9 3.5 - 5.1 mmol/L   Chloride 106 98 - 111 mmol/L   CO2 27 22 - 32 mmol/L   Glucose, Bld 152 (H) 70 - 99 mg/dL    Comment: Glucose reference range applies only to samples taken after fasting for at least 8 hours.   BUN 31 (H) 8 - 23 mg/dL   Creatinine, Ser 0.97 0.44 - 1.00 mg/dL   Calcium 8.2 (L) 8.9 - 10.3 mg/dL   Total Protein 6.2 (L) 6.5 - 8.1 g/dL   Albumin 2.1 (L) 3.5 - 5.0 g/dL   AST 14 (L) 15 - 41 U/L   ALT 15 0 - 44 U/L   Alkaline Phosphatase 38 38 - 126 U/L   Total Bilirubin 0.4 0.3 - 1.2 mg/dL   GFR, Estimated 58 (L) >60 mL/min    Comment: (NOTE) Calculated using the CKD-EPI Creatinine Equation (2021)    Anion gap 4 (L) 5 - 15    Comment: Performed at [facility], [place]  [address] [place], [place] [address]  CBC with Differential/Platelet     Status: Abnormal   Collection Time: 05/09/21  5:44 AM  Result Value Ref Range   WBC 4.5 4.0 - 10.5 K/uL   RBC 2.53 (L) 3.87 - 5.11 MIL/uL   Hemoglobin 7.9 (L) 12.0 - 15.0 g/dL   HCT 25.5 (L) 36.0 - 46.0 %   MCV 100.8 (H) 80.0 - 100.0 fL   MCH 31.2 26.0 - 34.0 pg   MCHC 31.0 30.0 - 36.0 g/dL   RDW 13.2 11.5 - 15.5 %   Platelets 234 150 - 400 K/uL   nRBC 0.0 0.0 - 0.2 %   Neutrophils Relative % 72 %   Neutro Abs 3.2 1.7 - 7.7 K/uL   Lymphocytes Relative 12 %   Lymphs Abs 0.5 (L) 0.7 - 4.0 K/uL   Monocytes Relative 10 %   Monocytes Absolute 0.5 0.1 - 1.0 K/uL   Eosinophils Relative 6 %   Eosinophils Absolute 0.3 0.0 - 0.5 K/uL   Basophils Relative 0 %   Basophils Absolute 0.0 0.0 - 0.1 K/uL   Immature Granulocytes 0 %   Abs Immature Granulocytes 0.01 0.00 - 0.07 K/uL    Comment: Performed at [facility], [place] [address], [place], [place] [address]  Protime-INR     Status: None   Collection Time: 05/09/21  5:44 AM  Result Value Ref Range   Prothrombin Time 14.7 11.4 - 15.2 seconds   INR 1.2 0.8 - 1.2    Comment: (NOTE) INR goal varies based on device and disease states. Performed at [facility], [place] [address] [place], [place] [address]   Iron and TIBC     Status: Abnormal   Collection Time: 05/09/21  5:45 AM  Result Value Ref Range   Iron 17 (L) 28 - 170 ug/dL   TIBC  188 (L) 250 - 450 ug/dL   Saturation Ratios 9 (L) 10.4 - 31.8 %   UIBC 171 ug/dL    Comment: Performed at [facility], [place] [address], [place], [place] [address]  Reticulocytes     Status: Abnormal   Collection Time: 05/09/21  5:45 AM  Result Value Ref Range   Retic Ct Pct 1.5 0.4 - 3.1 %   RBC. 2.49 (L) 3.87 - 5.11 MIL/uL   Retic Count, Absolute 37.4 19.0 - 186.0 K/uL   Immature Retic Fract 14.3 2.3 - 15.9 %    Comment: Performed at [facility], [place]  [address] [place], [place] [address]  Folate     Status: None   Collection Time: 05/09/21  5:45 AM  Result Value Ref Range   Folate 10.6 >5.9 ng/mL    Comment: Performed at [facility], [place] [address], [place], [place] [address]  Ferritin     Status: None   Collection Time: 05/09/21  5:45 AM  Result Value Ref Range   Ferritin 51 11 - 307 ng/mL    Comment: Performed at [facility], [place] [address], [place], [place] [address]  Glucose, capillary     Status: None   Collection Time: 05/09/21  7:59 AM  Result Value Ref Range   Glucose-Capillary 96 70 - 99 mg/dL    Comment: Glucose reference range applies only to samples taken after fasting for at least 8 hours.  Glucose, capillary     Status: Abnormal   Collection Time: 05/09/21 11:56 AM  Result Value Ref Range   Glucose-Capillary 139 (H) 70 - 99 mg/dL    Comment: Glucose reference range applies only to samples taken after fasting for at least 8 hours.  Glucose, capillary     Status: Abnormal   Collection Time: 05/09/21  4:15 PM  Result Value Ref Range   Glucose-Capillary 167 (H) 70 - 99 mg/dL    Comment: Glucose reference range applies only to samples taken after fasting for at least 8 hours.  Glucose, capillary     Status: Abnormal   Collection Time: 05/09/21  8:17 PM  Result Value Ref Range   Glucose-Capillary 165 (H) 70 - 99 mg/dL    Comment: Glucose reference range applies only to samples taken after fasting for at least 8 hours.  Vitamin B12     Status: None   Collection Time: 05/10/21  4:58 AM  Result Value Ref Range   Vitamin B-12 495 180 - 914 pg/mL    Comment: (NOTE) This assay is not validated for testing neonatal or myeloproliferative syndrome specimens for Vitamin B12 levels. Performed at [facility], [place] [address] [place], [place] [address]   CBC with Differential/Platelet     Status: Abnormal   Collection Time: 05/10/21  4:58 AM  Result Value Ref Range    WBC 4.6 4.0 - 10.5 K/uL   RBC 2.50 (L) 3.87 - 5.11 MIL/uL   Hemoglobin 7.7 (L) 12.0 - 15.0 g/dL   HCT 25.7 (L) 36.0 - 46.0 %   MCV 102.8 (H) 80.0 - 100.0 fL   MCH 30.8 26.0 - 34.0 pg   MCHC 30.0 30.0 - 36.0 g/dL   RDW 13.2 11.5 - 15.5 %   Platelets 218 150 - 400 K/uL   nRBC 0.0 0.0 - 0.2 %   Neutrophils Relative % 68 %   Neutro Abs 3.2 1.7 - 7.7 K/uL   Lymphocytes Relative 13 %  Lymphs Abs 0.6 (L) 0.7 - 4.0 K/uL   Monocytes Relative 13 %   Monocytes Absolute 0.6 0.1 - 1.0 K/uL   Eosinophils Relative 6 %   Eosinophils Absolute 0.3 0.0 - 0.5 K/uL   Basophils Relative 0 %   Basophils Absolute 0.0 0.0 - 0.1 K/uL   Immature Granulocytes 0 %   Abs Immature Granulocytes 0.02 0.00 - 0.07 K/uL    Comment: Performed at [facility], [place] [address], [place], [place] [address]  Basic metabolic panel     Status: Abnormal   Collection Time: 05/10/21  4:58 AM  Result Value Ref Range   Sodium 134 (L) 135 - 145 mmol/L   Potassium 4.3 3.5 - 5.1 mmol/L   Chloride 106 98 - 111 mmol/L   CO2 22 22 - 32 mmol/L   Glucose, Bld 113 (H) 70 - 99 mg/dL    Comment: Glucose reference range applies only to samples taken after fasting for at least 8 hours.   BUN 36 (H) 8 - 23 mg/dL   Creatinine, Ser 1.11 (H) 0.44 - 1.00 mg/dL   Calcium 7.9 (L) 8.9 - 10.3 mg/dL   GFR, Estimated 49 (L) >60 mL/min    Comment: (NOTE) Calculated using the CKD-EPI Creatinine Equation (2021)    Anion gap 6 5 - 15    Comment: Performed at [facility], [place] [address] [place], [name] [address]  Glucose, capillary     Status: Abnormal   Collection Time: 05/10/21  7:57 AM  Result Value Ref Range   Glucose-Capillary 103 (H) 70 - 99 mg/dL    Comment: Glucose reference range applies only to samples taken after fasting for at least 8 hours.   Comment 1 Notify RN   Glucose, capillary     Status: Abnormal   Collection Time: 05/10/21 12:16 PM  Result Value Ref Range   Glucose-Capillary 141 (H)  70 - 99 mg/dL    Comment: Glucose reference range applies only to samples taken after fasting for at least 8 hours.   Comment 1 Notify RN     CT FEMUR RIGHT WO CONTRAST  Result Date: 05/10/2021 CLINICAL DATA:  Suspected osteomyelitis EXAM: CT OF THE LOWER RIGHT EXTREMITY WITHOUT CONTRAST TECHNIQUE: Multidetector CT imaging of the right lower extremity was performed according to the standard protocol. COMPARISON:  Radiographs dated May 08, 2021 FINDINGS: Bones/Joint/Cartilage No cortical erosion or periosteal reaction concerning for osteomyelitis. Advanced right hip osteoarthritis with bone on bone articulation and prominent osteophytes. Advanced right knee osteoarthritis. No acute fracture or suspicious osseous lesion Ligaments Suboptimally assessed by CT. Muscles and Tendons No acute abnormality. Soft tissues Deep skin wound in the posterolateral gluteal region with a gas containing soft tissue collection measuring approximately 3.5 x 2.0 x 8.3 cm (AP x TR x CC). Generalized subcutaneous soft tissue edema of the right lower extremity. IMPRESSION: 1. Gas containing soft tissue collection in the posterolateral gluteal region measuring at least 3.5 x 2.0 x 8.3 cm (AP x TR x CC). Surgical consultation for further management could be helpful. 2.  No CT evidence of acute osteomyelitis. 3.  Advanced right hip and right knee osteoarthritis. Electronically Signed   By: Keane Police D.O.   On: 05/10/2021 07:42   MR HIP RIGHT WO CONTRAST  Result Date: 05/08/2021 CLINICAL DATA:  Chronic ulcer to right hip EXAM: MR OF THE RIGHT HIP WITHOUT CONTRAST TECHNIQUE: Multiplanar, multisequence MR imaging was performed. No intravenous contrast was administered. Examination was terminated  after only 3 series were obtained secondary to patient pain. COMPARISON:  Radiograph 05/08/2021 FINDINGS: All 3 sequences are significantly degraded by patient motion. Bones: Heterogeneous marrow signal. There may be edema within the  sacrococcygeal region on limited axial views. Small focal area of edema within the right superior pubic ramus complete loss of the hip joint space with bone on bone appearance and remodeling of the femoral heads. Probable left supra-acetabular cyst. Articular cartilage and labrum Articular cartilage:  Nondiagnostic Labrum:  Nondiagnostic Joint or bursal effusion Joint effusion:  No gross hip effusions. Bursae: No gross bursal distension Muscles and tendons Muscles and tendons:  Generalized atrophy Other findings Miscellaneous: The patient's right hip soft tissue ulcer is only partially imaged on coronal views. There is a ulcer within the right lateral hip soft tissues. Abundant subcutaneous edema within the partially visualized right hip soft tissues laterally. There is probable edema within the lateral gluteus muscles as well. No gross focal fluid collection. IMPRESSION: 1. Nearly nondiagnostic study as only limited sequences were obtained, all significantly degraded by patient motion. In addition, the patient's right hip ulcer is incompletely included in the field of view. 2. Generalized soft tissue edema within the lateral hip soft tissues with suspected edema in the gluteus muscles. No gross marrow edema within the underlying femur allowing for excessive motion. 3. Nonspecific foci of suspected edema within the sacrococcygeal region and right superior pubic ramus. 4. Severe arthritis of the bilateral hips without gross hip effusion. Electronically Signed   By: Donavan Foil M.D.   On: 05/08/2021 21:40   DG Hip Unilat W or Wo Pelvis 2-3 Views Right  Result Date: 05/08/2021 CLINICAL DATA:  Pressure ulcer right hip and buttock EXAM: DG HIP (WITH OR WITHOUT PELVIS) 2-3V RIGHT COMPARISON:  04/17/2021 FINDINGS: No fracture or malalignment. 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 Bone on bone appearance with sclerosis. Gas collection within the right hip soft tissues  presumably related to the history of wound or ulcer. Pubic symphysis and rami appear intact. IMPRESSION: 1. Gas collection within the right hip soft tissues presumably related to the history of wound or ulcer. No definite acute osseous abnormality but would correlate with CT or MRI if continued concern. 2. Severe arthritis of the bilateral hips. Electronically Signed   By: Donavan Foil M.D.   On: 05/08/2021 17:52   DG FEMUR PORT, MIN 2 VIEWS RIGHT  Result Date: 05/09/2021 CLINICAL DATA:  Osteomyelitis of femur. EXAM: RIGHT FEMUR PORTABLE 2 VIEW COMPARISON:  MRI hip and right hip x-ray 05/08/2021 FINDINGS: No evidence for acute fracture. No cortical erosions are identified. Again seen are severe/end-stage degenerative changes of the right hip with joint space narrowing and osteophyte formation. There also moderate severe degenerative changes of the right knee with severe medial compartment and patellofemoral compartment joint space narrowing and tricompartmental osteophyte formation. Soft tissue swelling and air lateral to the right hip has slightly decreased. There are no new areas of soft tissue air. Vascular calcifications are seen the soft tissues. IMPRESSION: 1. No acute bony abnormality. 2. Soft tissue swelling and air in the right lateral hip have decreased. No new areas of soft tissue air. 3. Degenerative changes of the right knee and right hip appear stable. Electronically Signed   By: Ronney Asters M.D.   On: 05/09/2021 19:29    Review of Systems  All other systems reviewed and are negative. Blood pressure 135/75, pulse 61, temperature 97.9 F (36.6 C), temperature source  Oral, resp. rate 20, height 5\' 5"  (1.651 m), weight 65.2 kg, SpO2 100 %. Physical Exam HENT:     Head: Atraumatic.  Eyes:     Extraocular Movements: Extraocular movements intact.  Cardiovascular:     Pulses: Normal pulses.  Pulmonary:     Effort: Pulmonary effort is normal.  Musculoskeletal:     Comments: Right  posterior hip with approximately 4 x 3 cm open wound with exposed underlying muscle.  There is some packing in the wound which I removed.  The wound does probe deeply.  There is no current drainage.  The surrounding skin is healthy appearing.  There is no significant surrounding cellulitis.  Skin:    General: Skin is warm.  Neurological:     Mental Status: She is alert.  Psychiatric:        Mood and Affect: Mood normal.    Assessment/Plan: Deep right hip wound She would likely benefit from surgical debridement with placement of a wound VAC.  Following the initial surgical debridement she should be able to simply be followed with serial wound VAC changes and local wound care by the wound team.   I will plan on taking her to the operating room tomorrow morning for debridement and wound VAC placement. She should remain n.p.o. after midnight.  I will speak to her niece via telephone.  Penny [name] 05/10/2021, 12:47 PM

## 2021-05-10 NOTE — Progress Notes (Signed)
PROGRESS NOTE    Penny [name]  EYC:[id number] DOB: [birth year] DOA: 05/08/2021 PCP: Kelton Pillar, MD   Brief Narrative: Penny [name] is a 86 y.o. female with a history of chronic right hip wound, diabetes mellitus type 2, anemia of chronic disease. Patient presented secondary to worsening right hip wound with concern for abscess and cellulitis. IV antibiotics started.   Assessment & Plan:   Principal Problem:   Complicated open wound of right hip Active Problems:   Type 2 diabetes mellitus with vascular disease (HCC)   Acute prerenal azotemia   Acute on chronic anemia   Dementia (HCC)   Pressure injury of right hip, stage 4 (HCC)   Right hip wound/cellulitis Right hip/lateral gluteal abscess Chronic wound with evidence of abscess on CT imaging. Started empirically on Vancomycin and Cefepime IV. -Orthopedic surgery consult  Dehydration Treated with IV fluids. Improved.  Diabetes mellitus, type 2 Patient is on metformin as an outpatient. Hemoglobin A1C of 5.6%.  Iron deficiency anemia Iron panel suggests iron deficiency component. Ferric gluconate given.  Primary hypertension -Continue diltiazem and hydralazine  Cognitive impairment Would benefit from outpatient neurology/neuropsychology  Pressure injury Stage 4, right hip. POA   DVT prophylaxis: SCDs Code Status:   Code Status: DNR Family Communication: Niece at bedside Disposition Plan: Discharge pending orthopedic surgery recommendations/management, transition to oral antibiotics   Consultants:  Orthopedic surgery  Procedures:  None  Antimicrobials: Vancomycin Cefepime    Subjective: No concerns today.  Objective: Vitals:   05/09/21 2015 05/09/21 2326 05/10/21 0109 05/10/21 0341  BP: 123/60 (!) 113/56  117/65  Pulse: 65 (!) 58  64  Resp: 18 16  20   Temp: 98.1 F (36.7 C)   98.8 F (37.1 C)  TempSrc: Oral   Oral  SpO2: 100% 94%  97%  Weight: 65.2 kg  65.2 kg   Height:         Intake/Output Summary (Last 24 hours) at 05/10/2021 0902 Last data filed at 05/10/2021 0504 Gross per 24 hour  Intake 1442.09 ml  Output --  Net 1442.09 ml   Filed Weights   05/09/21 0135 05/09/21 2015 05/10/21 0109  Weight: 64.8 kg 65.2 kg 65.2 kg    Examination:  General exam: Appears calm and comfortable Respiratory system: Clear to auscultation. Respiratory effort normal. Cardiovascular system: S1 & S2 heard, RRR. No murmurs, rubs, gallops or clicks. Gastrointestinal system: Abdomen is nondistended, soft and nontender. No organomegaly or masses felt. Normal bowel sounds heard. Central nervous system: Alert and oriented. No focal neurological deficits. Musculoskeletal: No edema. No calf tenderness right hip wound with overlying dressing. Skin: No cyanosis. No rashes Psychiatry: Judgement and insight appear normal. Mood & affect appropriate.     Data Reviewed: I have personally reviewed following labs and imaging studies  CBC Lab Results  Component Value Date   WBC 4.6 05/10/2021   RBC 2.50 (L) 05/10/2021   HGB 7.7 (L) 05/10/2021   HCT 25.7 (L) 05/10/2021   MCV 102.8 (H) 05/10/2021   MCH 30.8 05/10/2021   PLT 218 05/10/2021   MCHC 30.0 05/10/2021   RDW 13.2 05/10/2021   LYMPHSABS 0.6 (L) 05/10/2021   MONOABS 0.6 05/10/2021   EOSABS 0.3 05/10/2021   BASOSABS 0.0 14/97/0263     Last metabolic panel Lab Results  Component Value Date   NA 134 (L) 05/10/2021   K 4.3 05/10/2021   CL 106 05/10/2021   CO2 22 05/10/2021   BUN 36 (H) 05/10/2021   CREATININE  1.11 (H) 05/10/2021   GLUCOSE 113 (H) 05/10/2021   GFRNONAA 49 (L) 05/10/2021   CALCIUM 7.9 (L) 05/10/2021   PHOS 3.5 04/18/2021   PROT 6.2 (L) 05/09/2021   ALBUMIN 2.1 (L) 05/09/2021   BILITOT 0.4 05/09/2021   ALKPHOS 38 05/09/2021   AST 14 (L) 05/09/2021   ALT 15 05/09/2021   ANIONGAP 6 05/10/2021    CBG (last 3)  Recent Labs    05/09/21 1615 05/09/21 2017 05/10/21 0757  GLUCAP 167* 165*  103*     GFR: Estimated Creatinine Clearance: 33.9 mL/min (A) (by C-G formula based on SCr of 1.11 mg/dL (H)).  Coagulation Profile: Recent Labs  Lab 05/09/21 0544  INR 1.2    Recent Results (from the past 240 hour(s))  Resp Panel by RT-PCR (Flu A&B, Covid) Nasopharyngeal Swab     Status: None   Collection Time: 05/08/21  8:50 PM   Specimen: Nasopharyngeal Swab; Nasopharyngeal(NP) swabs in vial transport medium  Result Value Ref Range Status   SARS Coronavirus 2 by RT PCR NEGATIVE NEGATIVE Final    Comment: (NOTE) SARS-CoV-2 target nucleic acids are NOT DETECTED.  The SARS-CoV-2 RNA is generally detectable in upper respiratory specimens during the acute phase of infection. The lowest concentration of SARS-CoV-2 viral copies this assay can detect is 138 copies/mL. A negative result does not preclude SARS-Cov-2 infection and should not be used as the sole basis for treatment or other patient management decisions. A negative result may occur with  improper specimen collection/handling, submission of specimen other than nasopharyngeal swab, presence of viral mutation(s) within the areas targeted by this assay, and inadequate number of viral copies(<138 copies/mL). A negative result must be combined with clinical observations, patient history, and epidemiological information. The expected result is Negative.  Fact Sheet for Patients:  EntrepreneurPulse.com.au  Fact Sheet for Healthcare Providers:  IncredibleEmployment.be  This test is no t yet approved or cleared by the [place] FDA and  has been authorized for detection and/or diagnosis of SARS-CoV-2 by FDA under an Emergency Use Authorization (EUA). This EUA will remain  in effect (meaning this test can be used) for the duration of the COVID-19 declaration under Section 564(b)(1) of the Act, 21 U.S.C.section 360bbb-3(b)(1), unless the authorization is terminated  or revoked sooner.        Influenza A by PCR NEGATIVE NEGATIVE Final   Influenza B by PCR NEGATIVE NEGATIVE Final    Comment: (NOTE) The Xpert Xpress SARS-CoV-2/FLU/RSV plus assay is intended as an aid in the diagnosis of influenza from Nasopharyngeal swab specimens and should not be used as a sole basis for treatment. Nasal washings and aspirates are unacceptable for Xpert Xpress SARS-CoV-2/FLU/RSV testing.  Fact Sheet for Patients: EntrepreneurPulse.com.au  Fact Sheet for Healthcare Providers: IncredibleEmployment.be  This test is not yet approved or cleared by the [place] FDA and has been authorized for detection and/or diagnosis of SARS-CoV-2 by FDA under an Emergency Use Authorization (EUA). This EUA will remain in effect (meaning this test can be used) for the duration of the COVID-19 declaration under Section 564(b)(1) of the Act, 21 U.S.C. section 360bbb-3(b)(1), unless the authorization is terminated or revoked.  Performed at [facility], [place] [address], [place], [place] [address]         Radiology Studies: CT FEMUR RIGHT WO CONTRAST  Result Date: 05/10/2021 CLINICAL DATA:  Suspected osteomyelitis EXAM: CT OF THE LOWER RIGHT EXTREMITY WITHOUT CONTRAST TECHNIQUE: Multidetector CT imaging of the right lower extremity was performed  according to the standard protocol. COMPARISON:  Radiographs dated May 08, 2021 FINDINGS: Bones/Joint/Cartilage No cortical erosion or periosteal reaction concerning for osteomyelitis. Advanced right hip osteoarthritis with bone on bone articulation and prominent osteophytes. Advanced right knee osteoarthritis. No acute fracture or suspicious osseous lesion Ligaments Suboptimally assessed by CT. Muscles and Tendons No acute abnormality. Soft tissues Deep skin wound in the posterolateral gluteal region with a gas containing soft tissue collection measuring approximately 3.5 x 2.0 x 8.3 cm (AP x TR x  CC). Generalized subcutaneous soft tissue edema of the right lower extremity. IMPRESSION: 1. Gas containing soft tissue collection in the posterolateral gluteal region measuring at least 3.5 x 2.0 x 8.3 cm (AP x TR x CC). Surgical consultation for further management could be helpful. 2.  No CT evidence of acute osteomyelitis. 3.  Advanced right hip and right knee osteoarthritis. Electronically Signed   By: Keane Police D.O.   On: 05/10/2021 07:42   MR HIP RIGHT WO CONTRAST  Result Date: 05/08/2021 CLINICAL DATA:  Chronic ulcer to right hip EXAM: MR OF THE RIGHT HIP WITHOUT CONTRAST TECHNIQUE: Multiplanar, multisequence MR imaging was performed. No intravenous contrast was administered. Examination was terminated after only 3 series were obtained secondary to patient pain. COMPARISON:  Radiograph 05/08/2021 FINDINGS: All 3 sequences are significantly degraded by patient motion. Bones: Heterogeneous marrow signal. There may be edema within the sacrococcygeal region on limited axial views. Small focal area of edema within the right superior pubic ramus complete loss of the hip joint space with bone on bone appearance and remodeling of the femoral heads. Probable left supra-acetabular cyst. Articular cartilage and labrum Articular cartilage:  Nondiagnostic Labrum:  Nondiagnostic Joint or bursal effusion Joint effusion:  No gross hip effusions. Bursae: No gross bursal distension Muscles and tendons Muscles and tendons:  Generalized atrophy Other findings Miscellaneous: The patient's right hip soft tissue ulcer is only partially imaged on coronal views. There is a ulcer within the right lateral hip soft tissues. Abundant subcutaneous edema within the partially visualized right hip soft tissues laterally. There is probable edema within the lateral gluteus muscles as well. No gross focal fluid collection. IMPRESSION: 1. Nearly nondiagnostic study as only limited sequences were obtained, all significantly degraded by  patient motion. In addition, the patient's right hip ulcer is incompletely included in the field of view. 2. Generalized soft tissue edema within the lateral hip soft tissues with suspected edema in the gluteus muscles. No gross marrow edema within the underlying femur allowing for excessive motion. 3. Nonspecific foci of suspected edema within the sacrococcygeal region and right superior pubic ramus. 4. Severe arthritis of the bilateral hips without gross hip effusion. Electronically Signed   By: Donavan Foil M.D.   On: 05/08/2021 21:40   DG Hip Unilat W or Wo Pelvis 2-3 Views Right  Result Date: 05/08/2021 CLINICAL DATA:  Pressure ulcer right hip and buttock EXAM: DG HIP (WITH OR WITHOUT PELVIS) 2-3V RIGHT COMPARISON:  04/17/2021 FINDINGS: No fracture or malalignment. 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 Bone on bone appearance with sclerosis. Gas collection within the right hip soft tissues presumably related to the history of wound or ulcer. Pubic symphysis and rami appear intact. IMPRESSION: 1. Gas collection within the right hip soft tissues presumably related to the history of wound or ulcer. No definite acute osseous abnormality but would correlate with CT or MRI if continued concern. 2. Severe arthritis of the bilateral hips. Electronically Signed  By: Donavan Foil M.D.   On: 05/08/2021 17:52   DG FEMUR PORT, MIN 2 VIEWS RIGHT  Result Date: 05/09/2021 CLINICAL DATA:  Osteomyelitis of femur. EXAM: RIGHT FEMUR PORTABLE 2 VIEW COMPARISON:  MRI hip and right hip x-ray 05/08/2021 FINDINGS: No evidence for acute fracture. No cortical erosions are identified. Again seen are severe/end-stage degenerative changes of the right hip with joint space narrowing and osteophyte formation. There also moderate severe degenerative changes of the right knee with severe medial compartment and patellofemoral compartment joint space narrowing and tricompartmental osteophyte  formation. Soft tissue swelling and air lateral to the right hip has slightly decreased. There are no new areas of soft tissue air. Vascular calcifications are seen the soft tissues. IMPRESSION: 1. No acute bony abnormality. 2. Soft tissue swelling and air in the right lateral hip have decreased. No new areas of soft tissue air. 3. Degenerative changes of the right knee and right hip appear stable. Electronically Signed   By: Ronney Asters M.D.   On: 05/09/2021 19:29        Scheduled Meds:  diltiazem  240 mg Oral Daily   feeding supplement  237 mL Oral BID BM   hydrALAZINE  25 mg Oral Q8H   insulin aspart  0-9 Units Subcutaneous TID WC   multivitamin with minerals  1 tablet Oral Daily   nutrition supplement (JUVEN)  1 packet Oral BID BM   Continuous Infusions:  ceFEPime (MAXIPIME) IV 2 g (05/09/21 2042)   ferric gluconate (FERRLECIT) IVPB 135 mL/hr at 05/09/21 2205   vancomycin Stopped (05/10/21 0025)     LOS: 2 days     Cordelia Poche, MD [facility] 05/10/2021, 9:02 AM  If 7PM-7AM, please contact night-coverage [email or web address]

## 2021-05-11 ENCOUNTER — Inpatient Hospital Stay (HOSPITAL_COMMUNITY): Payer: Medicare Other | Admitting: Anesthesiology

## 2021-05-11 ENCOUNTER — Encounter (HOSPITAL_COMMUNITY): Payer: Self-pay | Admitting: Internal Medicine

## 2021-05-11 ENCOUNTER — Inpatient Hospital Stay (HOSPITAL_COMMUNITY): Payer: Medicare Other

## 2021-05-11 ENCOUNTER — Other Ambulatory Visit: Payer: Self-pay

## 2021-05-11 ENCOUNTER — Encounter (HOSPITAL_COMMUNITY)
Admission: EM | Disposition: A | Discharge status: Skilled Nursing Facility | Payer: Self-pay | Source: Home / Self Care | Attending: Family Medicine

## 2021-05-11 DIAGNOSIS — D649 Anemia, unspecified: Secondary | ICD-10-CM | POA: Diagnosis not present

## 2021-05-11 DIAGNOSIS — N19 Unspecified kidney failure: Secondary | ICD-10-CM | POA: Diagnosis not present

## 2021-05-11 DIAGNOSIS — E43 Unspecified severe protein-calorie malnutrition: Secondary | ICD-10-CM | POA: Insufficient documentation

## 2021-05-11 DIAGNOSIS — S71001S Unspecified open wound, right hip, sequela: Secondary | ICD-10-CM | POA: Diagnosis not present

## 2021-05-11 DIAGNOSIS — I9581 Postprocedural hypotension: Secondary | ICD-10-CM | POA: Diagnosis not present

## 2021-05-11 HISTORY — PX: INCISION AND DRAINAGE HIP: SHX1801

## 2021-05-11 LAB — BASIC METABOLIC PANEL
Anion gap: 5 (ref 5–15)
BUN: 46 mg/dL — ABNORMAL HIGH (ref 8–23)
CO2: 24 mmol/L (ref 22–32)
Calcium: 8.4 mg/dL — ABNORMAL LOW (ref 8.9–10.3)
Chloride: 110 mmol/L (ref 98–111)
Creatinine, Ser: 1.47 mg/dL — ABNORMAL HIGH (ref 0.44–1.00)
GFR, Estimated: 35 mL/min — ABNORMAL LOW (ref >60)
Glucose, Bld: 120 mg/dL — ABNORMAL HIGH (ref 70–99)
Potassium: 5.6 mmol/L — ABNORMAL HIGH (ref 3.5–5.1)
Sodium: 139 mmol/L (ref 135–145)

## 2021-05-11 LAB — CBC
HCT: 23.7 % — ABNORMAL LOW (ref 36.0–46.0)
Hemoglobin: 7.9 g/dL — ABNORMAL LOW (ref 12.0–15.0)
MCH: 32 pg (ref 26.0–34.0)
MCHC: 33.3 g/dL (ref 30.0–36.0)
MCV: 96 fL (ref 80.0–100.0)
Platelets: 238 10*3/uL (ref 150–400)
RBC: 2.47 MIL/uL — ABNORMAL LOW (ref 3.87–5.11)
RDW: 13.2 % (ref 11.5–15.5)
WBC: 7.6 10*3/uL (ref 4.0–10.5)
nRBC: 0 % (ref 0.0–0.2)

## 2021-05-11 LAB — GLUCOSE, CAPILLARY
Glucose-Capillary: 109 mg/dL — ABNORMAL HIGH (ref 70–99)
Glucose-Capillary: 124 mg/dL — ABNORMAL HIGH (ref 70–99)
Glucose-Capillary: 124 mg/dL — ABNORMAL HIGH (ref 70–99)
Glucose-Capillary: 126 mg/dL — ABNORMAL HIGH (ref 70–99)
Glucose-Capillary: 127 mg/dL — ABNORMAL HIGH (ref 70–99)

## 2021-05-11 LAB — MRSA NEXT GEN BY PCR, NASAL: MRSA by PCR Next Gen: DETECTED — AB

## 2021-05-11 LAB — TYPE AND SCREEN
ABO/RH(D): B POS
Antibody Screen: NEGATIVE

## 2021-05-11 LAB — ABO/RH: ABO/RH(D): B POS

## 2021-05-11 IMAGING — DX DG ABD PORTABLE 1V
1 series · 1 of 1 positions shown · non-contrast
Comparison: CT abdomen and pelvis report only [DATE].

CLINICAL DATA: Abdominal distension.

EXAM:
PORTABLE ABDOMEN - 1 VIEW

[abdomen kub]
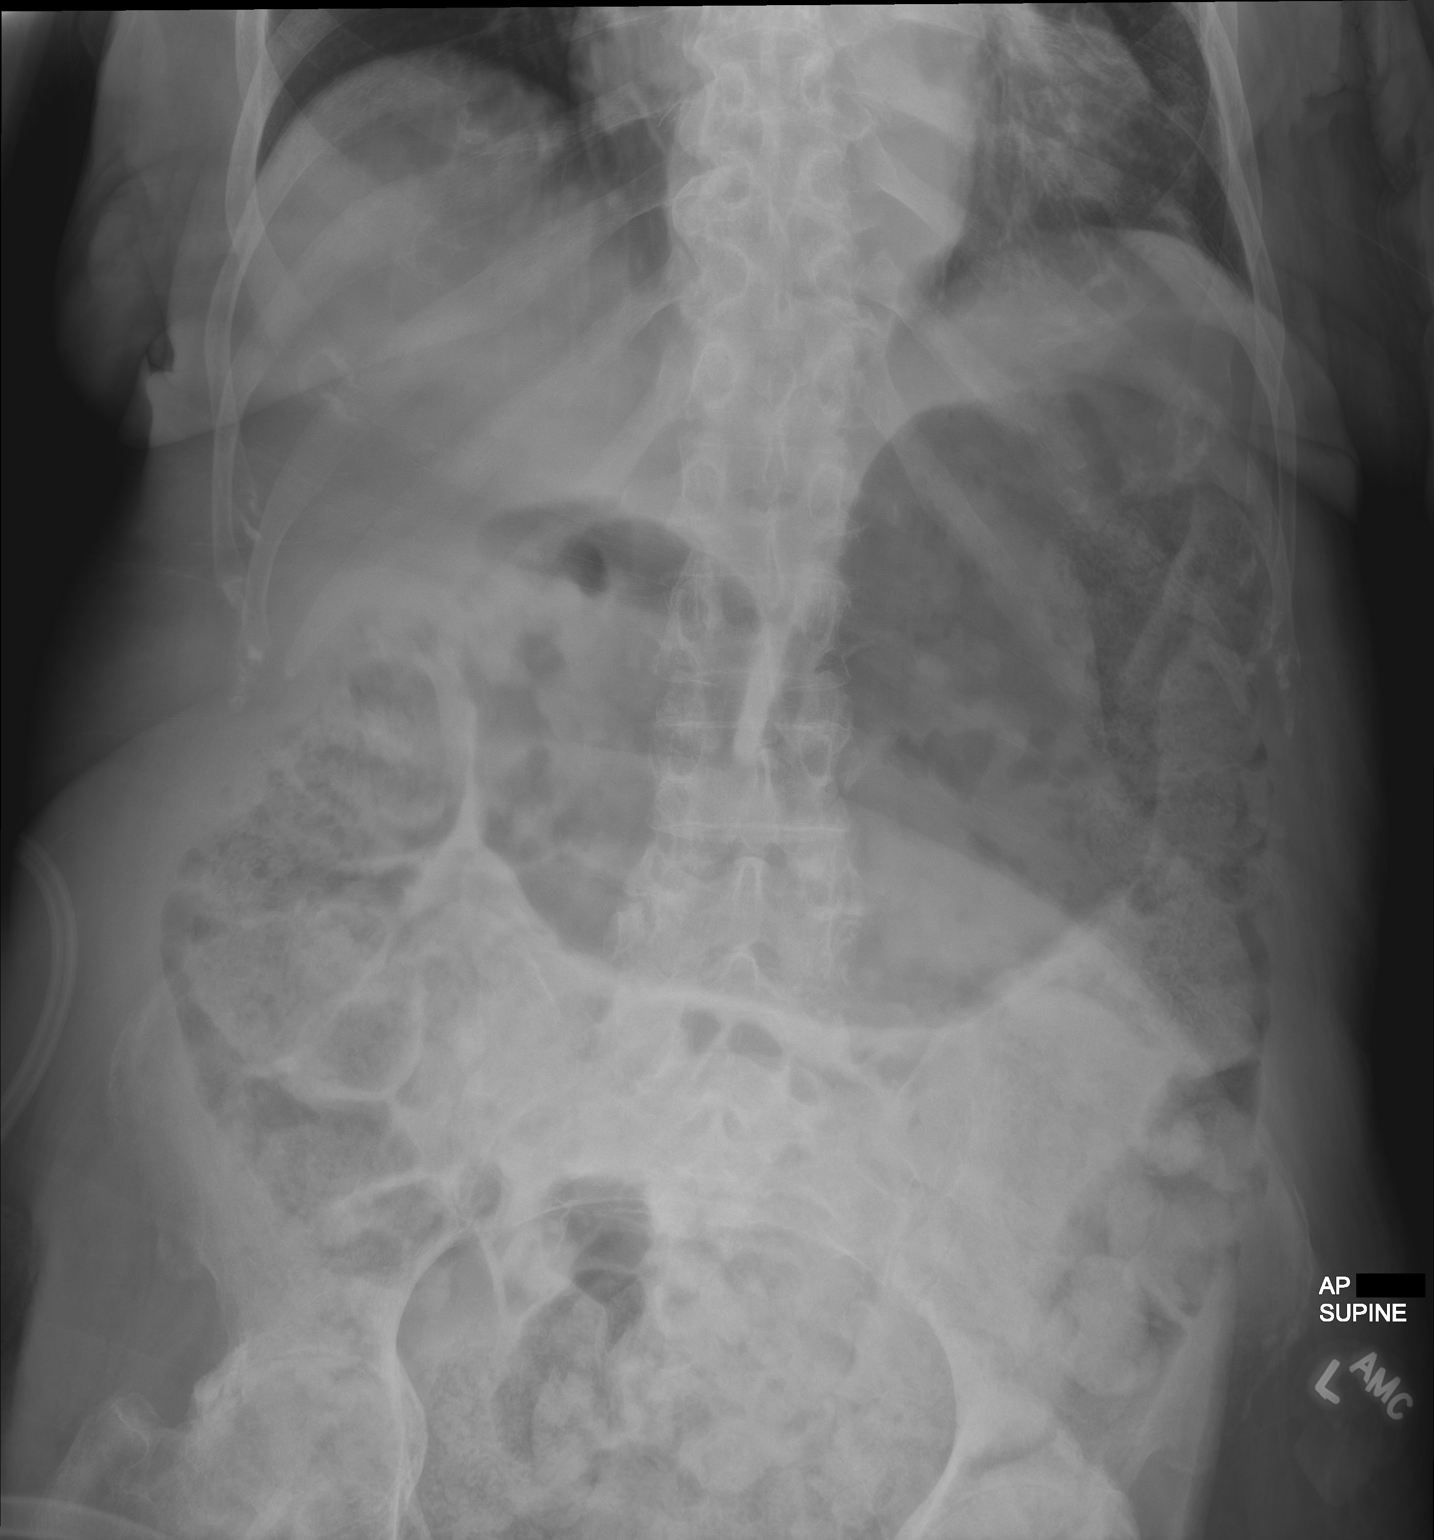

[1 of 1 positions shown; findings below may reference images not displayed]

FINDINGS: There is gaseous distention of the stomach. Large and small bowel
loops are nondilated. There is a very large amount of stool
throughout the colon. No suspicious calcifications are identified.
Visualized lung bases are clear. No acute fractures are seen.
Degenerative changes affect the hips.
IMPRESSION: 1. Gaseous distention of the stomach, nonspecific.
2. Large amount of stool throughout the colon.

## 2021-05-11 SURGERY — IRRIGATION AND DEBRIDEMENT HIP
Anesthesia: General | Site: Hip | Laterality: Right

## 2021-05-11 MED ORDER — MORPHINE SULFATE (PF) 2 MG/ML IV SOLN: 0.5000 mg | INTRAVENOUS | Status: DC | PRN

## 2021-05-11 MED ORDER — LACTATED RINGERS IV SOLN: INTRAVENOUS | Status: DC

## 2021-05-11 MED ORDER — ONDANSETRON HCL 4 MG/2ML IJ SOLN: 4.0000 mg | Freq: Once | INTRAMUSCULAR | Status: DC | PRN

## 2021-05-11 MED ORDER — ALBUMIN HUMAN 5 % IV SOLN
INTRAVENOUS | Status: AC
  Administered 2021-05-11: 12.5 g via INTRAVENOUS
  Filled 2021-05-11: qty 250

## 2021-05-11 MED ORDER — 0.9 % SODIUM CHLORIDE (POUR BTL) OPTIME
TOPICAL | Status: DC | PRN
  Administered 2021-05-11: 1000 mL

## 2021-05-11 MED ORDER — METHOCARBAMOL 500 MG PO TABS
500.0000 mg | ORAL_TABLET | Freq: Four times a day (QID) | ORAL | Status: DC | PRN
  Administered 2021-05-13 – 2021-05-15 (×4): 500 mg via ORAL
  Filled 2021-05-11 (×5): qty 1

## 2021-05-11 MED ORDER — GLYCOPYRROLATE 0.2 MG/ML IJ SOLN
INTRAMUSCULAR | Status: DC | PRN
  Administered 2021-05-11: .5 mg via INTRAVENOUS

## 2021-05-11 MED ORDER — FENTANYL CITRATE PF 50 MCG/ML IJ SOSY: 25.0000 ug | PREFILLED_SYRINGE | INTRAMUSCULAR | Status: DC | PRN

## 2021-05-11 MED ORDER — EPHEDRINE SULFATE 50 MG/ML IJ SOLN
INTRAMUSCULAR | Status: DC | PRN
  Administered 2021-05-11: 10 mg via INTRAVENOUS
  Administered 2021-05-11 (×2): 5 mg via INTRAVENOUS

## 2021-05-11 MED ORDER — LACTATED RINGERS IV BOLUS
250.0000 mL | Freq: Once | INTRAVENOUS | Status: AC
  Administered 2021-05-11: 250 mL via INTRAVENOUS

## 2021-05-11 MED ORDER — VANCOMYCIN VARIABLE DOSE PER UNSTABLE RENAL FUNCTION (PHARMACIST DOSING): Status: DC

## 2021-05-11 MED ORDER — DOCUSATE SODIUM 100 MG PO CAPS
100.0000 mg | ORAL_CAPSULE | Freq: Two times a day (BID) | ORAL | Status: DC
  Administered 2021-05-11 – 2021-05-16 (×10): 100 mg via ORAL
  Filled 2021-05-11 (×10): qty 1

## 2021-05-11 MED ORDER — HYDROCODONE-ACETAMINOPHEN 5-325 MG PO TABS
1.0000 | ORAL_TABLET | ORAL | Status: DC | PRN
  Administered 2021-05-13 – 2021-05-15 (×2): 1 via ORAL
  Filled 2021-05-11: qty 1
  Filled 2021-05-11: qty 2

## 2021-05-11 MED ORDER — ACETAMINOPHEN 500 MG PO TABS
500.0000 mg | ORAL_TABLET | Freq: Four times a day (QID) | ORAL | Status: AC
  Administered 2021-05-11 – 2021-05-12 (×4): 500 mg via ORAL
  Filled 2021-05-11 (×4): qty 1

## 2021-05-11 MED ORDER — ROCURONIUM BROMIDE 10 MG/ML (PF) SYRINGE
PREFILLED_SYRINGE | INTRAVENOUS | Status: DC | PRN
  Administered 2021-05-11: 40 mg via INTRAVENOUS

## 2021-05-11 MED ORDER — SODIUM CHLORIDE 0.9 % IR SOLN
Status: DC | PRN
  Administered 2021-05-11: 3000 mL

## 2021-05-11 MED ORDER — ALBUMIN HUMAN 5 % IV SOLN: 12.5000 g | Freq: Once | INTRAVENOUS | Status: AC

## 2021-05-11 MED ORDER — PHENYLEPHRINE HCL-NACL 20-0.9 MG/250ML-% IV SOLN
INTRAVENOUS | Status: DC | PRN
  Administered 2021-05-11: 40 ug/min via INTRAVENOUS

## 2021-05-11 MED ORDER — ONDANSETRON HCL 4 MG PO TABS: 4.0000 mg | ORAL_TABLET | Freq: Four times a day (QID) | ORAL | Status: DC | PRN

## 2021-05-11 MED ORDER — METHOCARBAMOL 1000 MG/10ML IJ SOLN
500.0000 mg | Freq: Four times a day (QID) | INTRAVENOUS | Status: DC | PRN
  Filled 2021-05-11: qty 5

## 2021-05-11 MED ORDER — SODIUM CHLORIDE 0.9 % IV SOLN
2.0000 g | INTRAVENOUS | Status: DC
  Administered 2021-05-11 – 2021-05-12 (×2): 2 g via INTRAVENOUS
  Filled 2021-05-11 (×2): qty 2

## 2021-05-11 MED ORDER — FENTANYL CITRATE (PF) 100 MCG/2ML IJ SOLN
INTRAMUSCULAR | Status: AC
  Filled 2021-05-11: qty 2

## 2021-05-11 MED ORDER — SUGAMMADEX SODIUM 200 MG/2ML IV SOLN
INTRAVENOUS | Status: DC | PRN
  Administered 2021-05-11: 200 mg via INTRAVENOUS

## 2021-05-11 MED ORDER — SODIUM CHLORIDE 0.9 % IV SOLN: INTRAVENOUS | Status: DC

## 2021-05-11 MED ORDER — PHENYLEPHRINE HCL-NACL 20-0.9 MG/250ML-% IV SOLN
25.0000 ug/min | INTRAVENOUS | Status: DC
  Administered 2021-05-11: 20 ug/min via INTRAVENOUS

## 2021-05-11 MED ORDER — PHENYLEPHRINE 40 MCG/ML (10ML) SYRINGE FOR IV PUSH (FOR BLOOD PRESSURE SUPPORT)
PREFILLED_SYRINGE | INTRAVENOUS | Status: DC | PRN
  Administered 2021-05-11 (×2): 80 ug via INTRAVENOUS

## 2021-05-11 MED ORDER — PROPOFOL 10 MG/ML IV BOLUS
INTRAVENOUS | Status: DC | PRN
  Administered 2021-05-11: 70 mg via INTRAVENOUS

## 2021-05-11 MED ORDER — LIDOCAINE 2% (20 MG/ML) 5 ML SYRINGE
INTRAMUSCULAR | Status: DC | PRN
  Administered 2021-05-11: 40 mg via INTRAVENOUS

## 2021-05-11 MED ORDER — FENTANYL CITRATE (PF) 100 MCG/2ML IJ SOLN
INTRAMUSCULAR | Status: DC | PRN
  Administered 2021-05-11 (×2): 25 ug via INTRAVENOUS

## 2021-05-11 MED ORDER — METOCLOPRAMIDE HCL 5 MG/ML IJ SOLN: 5.0000 mg | Freq: Three times a day (TID) | INTRAMUSCULAR | Status: DC | PRN

## 2021-05-11 MED ORDER — METOCLOPRAMIDE HCL 5 MG PO TABS: 5.0000 mg | ORAL_TABLET | Freq: Three times a day (TID) | ORAL | Status: DC | PRN

## 2021-05-11 MED ORDER — ONDANSETRON HCL 4 MG/2ML IJ SOLN: 4.0000 mg | Freq: Four times a day (QID) | INTRAMUSCULAR | Status: DC | PRN

## 2021-05-11 SURGICAL SUPPLY — 57 items
BAG COUNTER SPONGE SURGICOUNT (BAG) ×2 IMPLANT
BAG SPNG CNTER NS LX DISP (BAG) ×1
BANDAGE ESMARK 6X9 LF (GAUZE/BANDAGES/DRESSINGS) IMPLANT
BLADE SURG SZ10 CARB STEEL (BLADE) ×2 IMPLANT
BNDG CMPR 9X4 STRL LF SNTH (GAUZE/BANDAGES/DRESSINGS)
BNDG CMPR 9X6 STRL LF SNTH (GAUZE/BANDAGES/DRESSINGS)
BNDG COHESIVE 4X5 TAN ST LF (GAUZE/BANDAGES/DRESSINGS) ×2 IMPLANT
BNDG ELASTIC 4X5.8 VLCR STR LF (GAUZE/BANDAGES/DRESSINGS) ×2 IMPLANT
BNDG ELASTIC 6X5.8 VLCR STR LF (GAUZE/BANDAGES/DRESSINGS) ×2 IMPLANT
BNDG ESMARK 4X9 LF (GAUZE/BANDAGES/DRESSINGS) IMPLANT
BNDG ESMARK 6X9 LF (GAUZE/BANDAGES/DRESSINGS)
BNDG GAUZE ELAST 4 BULKY (GAUZE/BANDAGES/DRESSINGS) ×2 IMPLANT
CNTNR URN SCR LID CUP LEK RST (MISCELLANEOUS) IMPLANT
CONT SPEC 4OZ STRL OR WHT (MISCELLANEOUS)
COVER SURGICAL LIGHT HANDLE (MISCELLANEOUS) ×2 IMPLANT
CUFF TOURN SGL QUICK 34 (TOURNIQUET CUFF)
CUFF TRNQT CYL 34X4.125X (TOURNIQUET CUFF) IMPLANT
DRAPE SURG 17X23 STRL (DRAPES) IMPLANT
DRAPE U-SHAPE 47X51 STRL (DRAPES) IMPLANT
DRSG PAD ABDOMINAL 8X10 ST (GAUZE/BANDAGES/DRESSINGS) ×2 IMPLANT
DRSG VAC ATS SM SENSATRAC (GAUZE/BANDAGES/DRESSINGS) ×1 IMPLANT
DURAPREP 26ML APPLICATOR (WOUND CARE) ×2 IMPLANT
ELECT REM PT RETURN 15FT ADLT (MISCELLANEOUS) IMPLANT
EVACUATOR 1/8 PVC DRAIN (DRAIN) IMPLANT
FACESHIELD WRAPAROUND (MASK) ×2 IMPLANT
FACESHIELD WRAPAROUND OR TEAM (MASK) ×1 IMPLANT
GAUZE SPONGE 4X4 12PLY STRL (GAUZE/BANDAGES/DRESSINGS) ×2 IMPLANT
GAUZE XEROFORM 1X8 LF (GAUZE/BANDAGES/DRESSINGS) ×2 IMPLANT
GLOVE SRG 8 PF TXTR STRL LF DI (GLOVE) ×1 IMPLANT
GLOVE SURG ENC MOIS LTX SZ7.5 (GLOVE) ×4 IMPLANT
GLOVE SURG UNDER POLY LF SZ7.5 (GLOVE) ×2 IMPLANT
GLOVE SURG UNDER POLY LF SZ8 (GLOVE) ×2
GOWN STRL REUS W/ TWL LRG LVL3 (GOWN DISPOSABLE) ×1 IMPLANT
GOWN STRL REUS W/TWL LRG LVL3 (GOWN DISPOSABLE) ×2
GOWN STRL REUS W/TWL XL LVL3 (GOWN DISPOSABLE) ×2 IMPLANT
HANDPIECE INTERPULSE COAX TIP (DISPOSABLE)
KIT BASIN OR (CUSTOM PROCEDURE TRAY) ×2 IMPLANT
KIT TURNOVER KIT A (KITS) IMPLANT
MANIFOLD NEPTUNE II (INSTRUMENTS) ×2 IMPLANT
NDL HYPO 25X1 1.5 SAFETY (NEEDLE) IMPLANT
NEEDLE HYPO 25X1 1.5 SAFETY (NEEDLE) IMPLANT
NS IRRIG 1000ML POUR BTL (IV SOLUTION) ×2 IMPLANT
PACK ORTHO EXTREMITY (CUSTOM PROCEDURE TRAY) ×2 IMPLANT
PAD ARMBOARD 7.5X6 YLW CONV (MISCELLANEOUS) ×4 IMPLANT
SET HNDPC FAN SPRY TIP SCT (DISPOSABLE) IMPLANT
SET IRRIG Y TYPE TUR BLADDER L (SET/KITS/TRAYS/PACK) IMPLANT
SPONGE T-LAP 18X18 ~~LOC~~+RFID (SPONGE) IMPLANT
STOCKINETTE 6  STRL (DRAPES) ×2
STOCKINETTE 6 STRL (DRAPES) ×1 IMPLANT
SUT ETHILON 3 0 PS 1 (SUTURE) IMPLANT
SUT PDS AB 2-0 CT2 27 (SUTURE) IMPLANT
SWAB CULTURE ESWAB REG 1ML (MISCELLANEOUS) IMPLANT
SYR CONTROL 10ML LL (SYRINGE) IMPLANT
TOWEL OR 17X26 10 PK STRL BLUE (TOWEL DISPOSABLE) ×4 IMPLANT
TUBING CONNECTING 10 (TUBING) ×2 IMPLANT
UNDERPAD 30X36 HEAVY ABSORB (UNDERPADS AND DIAPERS) ×2 IMPLANT
YANKAUER SUCT BULB TIP NO VENT (SUCTIONS) ×2 IMPLANT

## 2021-05-11 NOTE — Consult Note (Signed)
[place] nursing team to assume VAC dressing to the gluteal wound beginning 05/15/21.  Surgeon or PA will perform first post op change on Saturday 05/13/21.   Supplies ordered 3 VAC dressing kits to the room.  [place], [place], [place]

## 2021-05-11 NOTE — Progress Notes (Addendum)
PROGRESS NOTE    Penny [name]  TFT:[id number] DOB: 08-12-1934 DOA: 05/08/2021 PCP: Kelton Pillar, MD   Brief Narrative: Penny [name] is a 86 y.o. female with a history of chronic right hip wound, diabetes mellitus type 2, anemia of chronic disease. Patient presented secondary to worsening right hip wound with concern for abscess and cellulitis. IV antibiotics started.   Assessment & Plan:   Principal Problem:   Complicated open wound of right hip Active Problems:   Type 2 diabetes mellitus with vascular disease (HCC)   Acute prerenal azotemia   Acute on chronic anemia   Dementia (HCC)   Pressure injury of right hip, stage 4 (HCC)   Right hip wound/cellulitis Right hip/lateral gluteal abscess Chronic wound with evidence of abscess on CT imaging. Started empirically on Vancomycin and Cefepime IV. No blood cultures obtained. Patient underwent I&D with wound vac placement by orthopedic surgery on 10/27. Wound cultures obtained and are pending -Continue Vancomycin and Cefepime pending wound culture results  Dehydration Treated with IV fluids. Improved.  Diabetes mellitus, type 2 Patient is on metformin as an outpatient. Hemoglobin A1C of 5.6%.  Iron deficiency anemia Iron panel suggests iron deficiency component. Ferric gluconate given. Hemoglobin trended down. -CBC today  Primary hypertension -Continue diltiazem and hydralazine  Cognitive impairment Would benefit from outpatient neurology/neuropsychology  Pressure injury Stage 4, right hip. POA  Hypotension Post-op. BP down to 57/32 necessitating initiation of Levophed. Levophed weaned off after 30 minutes. -Vitals q4 hours for the next 24 hours  Abdominal distension Mild abdominal pain. Possible an aspect of constipation. -Abdominal x-ray  CKD stage IIIa  DVT prophylaxis: SCDs Code Status:   Code Status: DNR Family Communication: Niece at bedside Disposition Plan: Discharge to SNF pending transition to  oral antibiotics   Consultants:  Orthopedic surgery  Procedures:  None  Antimicrobials: Vancomycin Cefepime    Subjective: Patient reports no concerns  Objective: Vitals:   05/11/21 1100 05/11/21 1105 05/11/21 1115 05/11/21 1152  BP: (!) 104/55 (!) 106/51 (!) 109/52 (!) 115/54  Pulse: (!) 51 (!) 54 (!) 55 (!) 55  Resp: 14 16 15 14   Temp:    98.2 F (36.8 C)  TempSrc:    Oral  SpO2: 95% 95% 96% 100%  Weight:      Height:        Intake/Output Summary (Last 24 hours) at 05/11/2021 1207 Last data filed at 05/11/2021 1100 Gross per 24 hour  Intake 2330.11 ml  Output 0 ml  Net 2330.11 ml    Filed Weights   05/09/21 2015 05/10/21 0109 05/11/21 0451  Weight: 65.2 kg 65.2 kg 64.2 kg    Examination:  General exam: Appears calm and comfortable Respiratory system: Clear to auscultation. Respiratory effort normal. Cardiovascular system: S1 & S2 heard, RRR. No murmurs, rubs, gallops or clicks. Gastrointestinal system: Abdomen is distended, soft and mildly tender. No organomegaly or masses felt. Normal bowel sounds heard. Central nervous system: Alert and oriented. No focal neurological deficits. Musculoskeletal: No edema. No calf tenderness. Wound vac of right hip noted Skin: No cyanosis. No rashes Psychiatry: Judgement and insight appear normal. Mood & affect appropriate.    Data Reviewed: I have personally reviewed following labs and imaging studies  CBC Lab Results  Component Value Date   WBC 4.6 05/10/2021   RBC 2.50 (L) 05/10/2021   HGB 7.7 (L) 05/10/2021   HCT 25.7 (L) 05/10/2021   MCV 102.8 (H) 05/10/2021   MCH 30.8 05/10/2021   PLT  218 05/10/2021   MCHC 30.0 05/10/2021   RDW 13.2 05/10/2021   LYMPHSABS 0.6 (L) 05/10/2021   MONOABS 0.6 05/10/2021   EOSABS 0.3 05/10/2021   BASOSABS 0.0 53/64/6803     Last metabolic panel Lab Results  Component Value Date   NA 134 (L) 05/10/2021   K 4.3 05/10/2021   CL 106 05/10/2021   CO2 22 05/10/2021   BUN  36 (H) 05/10/2021   CREATININE 1.11 (H) 05/10/2021   GLUCOSE 113 (H) 05/10/2021   GFRNONAA 49 (L) 05/10/2021   CALCIUM 7.9 (L) 05/10/2021   PHOS 3.5 04/18/2021   PROT 6.2 (L) 05/09/2021   ALBUMIN 2.1 (L) 05/09/2021   BILITOT 0.4 05/09/2021   ALKPHOS 38 05/09/2021   AST 14 (L) 05/09/2021   ALT 15 05/09/2021   ANIONGAP 6 05/10/2021    CBG (last 3)  Recent Labs    05/10/21 2107 05/11/21 0616 05/11/21 0849  GLUCAP 189* 109* 124*      GFR: Estimated Creatinine Clearance: 33.9 mL/min (A) (by C-G formula based on SCr of 1.11 mg/dL (H)).  Coagulation Profile: Recent Labs  Lab 05/09/21 0544  INR 1.2     Recent Results (from the past 240 hour(s))  Resp Panel by RT-PCR (Flu A&B, Covid) Nasopharyngeal Swab     Status: None   Collection Time: 05/08/21  8:50 PM   Specimen: Nasopharyngeal Swab; Nasopharyngeal(NP) swabs in vial transport medium  Result Value Ref Range Status   SARS Coronavirus 2 by RT PCR NEGATIVE NEGATIVE Final    Comment: (NOTE) SARS-CoV-2 target nucleic acids are NOT DETECTED.  The SARS-CoV-2 RNA is generally detectable in upper respiratory specimens during the acute phase of infection. The lowest concentration of SARS-CoV-2 viral copies this assay can detect is 138 copies/mL. A negative result does not preclude SARS-Cov-2 infection and should not be used as the sole basis for treatment or other patient management decisions. A negative result may occur with  improper specimen collection/handling, submission of specimen other than nasopharyngeal swab, presence of viral mutation(s) within the areas targeted by this assay, and inadequate number of viral copies(<138 copies/mL). A negative result must be combined with clinical observations, patient history, and epidemiological information. The expected result is Negative.  Fact Sheet for Patients:  EntrepreneurPulse.com.au  Fact Sheet for Healthcare Providers:   IncredibleEmployment.be  This test is no t yet approved or cleared by the [place] FDA and  has been authorized for detection and/or diagnosis of SARS-CoV-2 by FDA under an Emergency Use Authorization (EUA). This EUA will remain  in effect (meaning this test can be used) for the duration of the COVID-19 declaration under Section 564(b)(1) of the Act, 21 U.S.C.section 360bbb-3(b)(1), unless the authorization is terminated  or revoked sooner.       Influenza A by PCR NEGATIVE NEGATIVE Final   Influenza B by PCR NEGATIVE NEGATIVE Final    Comment: (NOTE) The Xpert Xpress SARS-CoV-2/FLU/RSV plus assay is intended as an aid in the diagnosis of influenza from Nasopharyngeal swab specimens and should not be used as a sole basis for treatment. Nasal washings and aspirates are unacceptable for Xpert Xpress SARS-CoV-2/FLU/RSV testing.  Fact Sheet for Patients: EntrepreneurPulse.com.au  Fact Sheet for Healthcare Providers: IncredibleEmployment.be  This test is not yet approved or cleared by the [place] FDA and has been authorized for detection and/or diagnosis of SARS-CoV-2 by FDA under an Emergency Use Authorization (EUA). This EUA will remain in effect (meaning this test can be used) for the duration of  the COVID-19 declaration under Section 564(b)(1) of the Act, 21 U.S.C. section 360bbb-3(b)(1), unless the authorization is terminated or revoked.  Performed at [facility], [place] [address] [place], [place] [address]   MRSA Next Gen by PCR, Nasal     Status: Abnormal   Collection Time: 05/11/21  4:00 AM   Specimen: Nasal Mucosa; Nasal Swab  Result Value Ref Range Status   MRSA by PCR Next Gen DETECTED (A) NOT DETECTED Final    Comment: RESULT CALLED TO, READ BACK BY AND VERIFIED WITH: YUNT,H. RN @1135  ON 10.27.2022 BY NMCCOY (NOTE) The GeneXpert MRSA Assay (FDA approved for NASAL specimens  only), is one component of a comprehensive MRSA colonization surveillance program. It is not intended to diagnose MRSA infection nor to guide or monitor treatment for MRSA infections. Test performance is not FDA approved in patients less than 17 years old. Performed at [facility], [place] [address], [place], [place] [address]          Radiology Studies: CT FEMUR RIGHT WO CONTRAST  Result Date: 05/10/2021 CLINICAL DATA:  Suspected osteomyelitis EXAM: CT OF THE LOWER RIGHT EXTREMITY WITHOUT CONTRAST TECHNIQUE: Multidetector CT imaging of the right lower extremity was performed according to the standard protocol. COMPARISON:  Radiographs dated May 08, 2021 FINDINGS: Bones/Joint/Cartilage No cortical erosion or periosteal reaction concerning for osteomyelitis. Advanced right hip osteoarthritis with bone on bone articulation and prominent osteophytes. Advanced right knee osteoarthritis. No acute fracture or suspicious osseous lesion Ligaments Suboptimally assessed by CT. Muscles and Tendons No acute abnormality. Soft tissues Deep skin wound in the posterolateral gluteal region with a gas containing soft tissue collection measuring approximately 3.5 x 2.0 x 8.3 cm (AP x TR x CC). Generalized subcutaneous soft tissue edema of the right lower extremity. IMPRESSION: 1. Gas containing soft tissue collection in the posterolateral gluteal region measuring at least 3.5 x 2.0 x 8.3 cm (AP x TR x CC). Surgical consultation for further management could be helpful. 2.  No CT evidence of acute osteomyelitis. 3.  Advanced right hip and right knee osteoarthritis. Electronically Signed   By: Keane Police D.O.   On: 05/10/2021 07:42   DG FEMUR PORT, MIN 2 VIEWS RIGHT  Result Date: 05/09/2021 CLINICAL DATA:  Osteomyelitis of femur. EXAM: RIGHT FEMUR PORTABLE 2 VIEW COMPARISON:  MRI hip and right hip x-ray 05/08/2021 FINDINGS: No evidence for acute fracture. No cortical erosions are identified.  Again seen are severe/end-stage degenerative changes of the right hip with joint space narrowing and osteophyte formation. There also moderate severe degenerative changes of the right knee with severe medial compartment and patellofemoral compartment joint space narrowing and tricompartmental osteophyte formation. Soft tissue swelling and air lateral to the right hip has slightly decreased. There are no new areas of soft tissue air. Vascular calcifications are seen the soft tissues. IMPRESSION: 1. No acute bony abnormality. 2. Soft tissue swelling and air in the right lateral hip have decreased. No new areas of soft tissue air. 3. Degenerative changes of the right knee and right hip appear stable. Electronically Signed   By: Ronney Asters M.D.   On: 05/09/2021 19:29        Scheduled Meds:  acetaminophen  500 mg Oral Q6H   diltiazem  240 mg Oral Daily   docusate sodium  100 mg Oral BID   feeding supplement  237 mL Oral BID BM   hydrALAZINE  25 mg Oral Q8H   hydrocerin   Topical BID  insulin aspart  0-9 Units Subcutaneous TID WC   multivitamin with minerals  1 tablet Oral Daily   nutrition supplement (JUVEN)  1 packet Oral BID BM   polyethylene glycol  17 g Oral Daily   Continuous Infusions:  sodium chloride     ceFEPime (MAXIPIME) IV 2 g (05/10/21 2030)   lactated ringers 50 mL/hr at 05/11/21 0731   methocarbamol (ROBAXIN) IV     vancomycin 750 mg (05/10/21 2309)     LOS: 3 days     Cordelia Poche, MD [facility] 05/11/2021, 12:07 PM  If 7PM-7AM, please contact night-coverage [email or web address]

## 2021-05-11 NOTE — Progress Notes (Addendum)
Pharmacy Antibiotic Note  Penny [name] is a 86 y.o. female admitted on 05/08/2021 with R hip wound infection.  Pharmacy has been consulted for vancomycin and cefepime dosing. Patient was taken for I&D of wound 10/27. Per Op note, there was purulence in the wound but appeared clean after debridement.  PM Update: Scr increased up to 1.47 today, CrCl ~25 mL/min. Baseline Scr <1  Plan: Adjust cefepime to 2g q24 hours Discontinue scheduled vancomycin order  Check vancomycin random 10/28 @ 0500, re-dose when VR < 20 Check vancomycin levels as needed, goal AUC 400-550 Monitor renal function F/u wound culture  Height: 5\' 5"  (165.1 cm) Weight: 64.2 kg (141 lb 8.6 oz) IBW/kg (Calculated) : 57  Temp (24hrs), Avg:98.3 F (36.8 C), Min:97.8 F (36.6 C), Max:99.1 F (37.3 C)  Recent Labs  Lab 05/08/21 1625 05/09/21 0544 05/10/21 0458  WBC 7.2 4.5 4.6  CREATININE 1.04* 0.97 1.11*     Estimated Creatinine Clearance: 33.9 mL/min (A) (by C-G formula based on SCr of 1.11 mg/dL (H)).    Allergies  Allergen Reactions   Glipizide Rash    Antimicrobials this admission:  10/24 Vanc >> 10/24 Cefepime >>   Dose adjustments this admission:    Microbiology results:  10/27 L hip tissue: moderate gram negative rods, few gram positive cocci  Thank you for allowing pharmacy to be a part of this patient's care.  Dimple Nanas, PharmD 05/11/2021 12:30 PM

## 2021-05-11 NOTE — Op Note (Signed)
Procedure(s): IRRIGATION AND DEBRIDEMENT HIP Procedure Note  Penny [name] female 86 y.o. 05/11/2021   Preoperative diagnosis: Right lateral hip deep wound with abscess  Postoperative diagnosis same  Procedure performed: Irrigation debridement right hip deep lateral wound   Surgeon(s) and Role:    Tania Ade, MD - Primary   Indications:  86 y.o. female with chronic right posterior hip wound with recent worsening of symptoms and drainage.  Indicated for surgical debridement to try and allow more effective wound care and wound healing.     Surgeon: Rhae Hammock   Assistants: Sheryle Hail PA-C [name] was present and scrubbed throughout the procedure and was essential in positioning, retraction, exposure, and closure)  Anesthesia: General endotracheal anesthesia    Procedure Detail  IRRIGATION AND DEBRIDEMENT HIP  Findings: There was purulence in phlegmonous material deep in the wound.  After debridement the wound appeared clean.  Cultures were taken.  Estimated Blood Loss:  Minimal         Drains: none  Blood Given: none          Specimens: none        Complications:  * No complications entered in OR log *         Disposition: PACU - hemodynamically stable.         Condition: stable    Procedure:  The patient was identified in the preoperative  holding area where I personally marked the operative site after  verifying site side and procedure with the patient. She was taken back  to the operating room where general anesthesia was induced without  Complication. The patient was placed in lateral decubitus position with the  right side up.  The right posterior lateral hip was prepped and draped in standard sterile fashion.  The wound measured approximately 5 x 4 cm.  The rondure was used to remove several large areas of phlegmonous material.  Cultures were taken deeply.  The Cobb elevator was then used to scrape the edges of the wound and remove any  nonviable and unhealthy appearing tissue.  The pulse irrigator was then used to copiously irrigate the wound.  At this point the edges were felt to be clean and back to healthy bleeding tissue.  The skin edges were also sharply resected to get to a clean healing surface.  The wound probed approximately 6 to 8 cm deep and there were no other palpable tracts deep in the wound.  At this point a small sponge from the [facility] dressing set was placed deep in the wound and the VAC dressing was applied successfully.  Patient was then allowed to awaken from anesthesia transferred to the stretcher and taken to the recovery room in stable condition.   POSTOPERATIVE PLAN: The patient will be Readmitted to the hospitalist service for further management.  The VAC dressing should be changed in the next 2 to 3 days and then wound care can be again resumed by the wound care team.  Further follow-up with orthopedics is not necessary.

## 2021-05-11 NOTE — Anesthesia Procedure Notes (Signed)
Procedure Name: Intubation Date/Time: 05/11/2021 7:39 AM Performed by: Deliah Boston, CRNA Pre-anesthesia Checklist: Patient identified, Emergency Drugs available, Suction available and Patient being monitored Patient Re-evaluated:Patient Re-evaluated prior to induction Oxygen Delivery Method: Circle system utilized Preoxygenation: Pre-oxygenation with 100% oxygen Induction Type: IV induction Ventilation: Mask ventilation without difficulty Laryngoscope Size: Mac and 3 Grade View: Grade I Tube type: Oral Tube size: 7.0 mm Number of attempts: 1 Airway Equipment and Method: Stylet Placement Confirmation: ETT inserted through vocal cords under direct vision, positive ETCO2 and breath sounds checked- equal and bilateral Secured at: 21 cm Tube secured with: Tape Dental Injury: Teeth and Oropharynx as per pre-operative assessment

## 2021-05-11 NOTE — Transfer of Care (Signed)
Immediate Anesthesia Transfer of Care Note  Patient: Penny [name]  Procedure(s) Performed: Procedure(s): IRRIGATION AND DEBRIDEMENT HIP PLACEMENT OF WOUND VAC (Right)  Patient Location: PACU  Anesthesia Type:General  Level of Consciousness: Patient easily awoken, sedated, comfortable, cooperative, following commands, responds to stimulation.   Airway & Oxygen Therapy: Patient spontaneously breathing, ventilating well, oxygen via simple oxygen mask.  Post-op Assessment: Report given to PACU RN, vital signs reviewed and stable, moving all extremities.   Post vital signs: Reviewed and stable.  Complications: No apparent anesthesia complications Last Vitals:  Vitals Value Taken Time  BP 98/36 05/11/21 0830  Temp    Pulse 56 05/11/21 0832  Resp 20 05/11/21 0832  SpO2 98 % 05/11/21 0832  Vitals shown include unvalidated device data.  Last Pain:  Vitals:   05/10/21 2242  TempSrc:   PainSc: 0-No pain         Complications: No notable events documented.

## 2021-05-11 NOTE — Anesthesia Postprocedure Evaluation (Signed)
Anesthesia Post Note  Patient: Penny [name]  Procedure(s) Performed: IRRIGATION AND DEBRIDEMENT HIP PLACEMENT OF WOUND VAC (Right: Hip)     Patient location during evaluation: PACU Anesthesia Type: General Level of consciousness: awake and alert, oriented and patient cooperative Pain management: pain level controlled Vital Signs Assessment: post-procedure vital signs reviewed and stable Respiratory status: spontaneous breathing, nonlabored ventilation and respiratory function stable Cardiovascular status: blood pressure returned to baseline and stable Postop Assessment: no apparent nausea or vomiting Anesthetic complications: no Comments: Difficult to normalize BP despite adequate fluid resuscitation, on phenylephrine for a brief period of time and titrated off.    No notable events documented.  Last Vitals:  Vitals:   05/11/21 1115 05/11/21 1152  BP: (!) 109/52 (!) 115/54  Pulse: (!) 55 (!) 55  Resp: 15 14  Temp:  36.8 C  SpO2: 96% 100%    Last Pain:  Vitals:   05/11/21 1152  TempSrc: Oral  PainSc:                  Pervis Hocking

## 2021-05-11 NOTE — Anesthesia Preprocedure Evaluation (Addendum)
Anesthesia Evaluation  Patient identified by MRN, date of birth, ID band Patient awake    Reviewed: Allergy & Precautions, NPO status , Patient's Chart, lab work & pertinent test results  Airway Mallampati: II  TM Distance: >3 FB Neck ROM: Full    Dental no notable dental hx. (+) Teeth Intact, Dental Advisory Given   Pulmonary neg pulmonary ROS,    Pulmonary exam normal breath sounds clear to auscultation       Cardiovascular negative cardio ROS Normal cardiovascular exam Rhythm:Regular Rate:Normal     Neuro/Psych PSYCHIATRIC DISORDERS Dementia negative neurological ROS     GI/Hepatic negative GI ROS, Neg liver ROS,   Endo/Other  diabetes, Well Controlled, Type 2, Insulin Dependent, Oral Hypoglycemic Agents  Renal/GU Renal InsufficiencyRenal diseaseCr 1.11, CKD 3  negative genitourinary   Musculoskeletal Right hip wound   Abdominal   Peds negative pediatric ROS (+)  Hematology  (+) Blood dyscrasia, anemia , 7.7/25.7, plt 218   Anesthesia Other Findings Malnutrition Chronic pressure wounds  Reproductive/Obstetrics negative OB ROS                            Anesthesia Physical Anesthesia Plan  ASA: 3  Anesthesia Plan: General   Post-op Pain Management:    Induction: Intravenous  PONV Risk Score and Plan: 3 and Ondansetron and Dexamethasone  Airway Management Planned: Oral ETT  Additional Equipment: None  Intra-op Plan:   Post-operative Plan: Extubation in OR  Informed Consent: I have reviewed the patients History and Physical, chart, labs and discussed the procedure including the risks, benefits and alternatives for the proposed anesthesia with the patient or authorized representative who has indicated his/her understanding and acceptance.   Patient has DNR.  Discussed DNR with power of attorney and Suspend DNR.   Dental advisory given and Consent reviewed with POA  Plan  Discussed with: CRNA  Anesthesia Plan Comments: (Type and screen)       Anesthesia Quick Evaluation

## 2021-05-11 NOTE — Interval H&P Note (Signed)
History and Physical Interval Note:  05/11/2021 7:18 AM  Penny [name]  has presented today for surgery, with the diagnosis of right hip wound.  The various methods of treatment have been discussed with the patient and family. After consideration of risks, benefits and other options for treatment, the patient has consented to  Procedure(s): IRRIGATION AND DEBRIDEMENT HIP (Right) as a surgical intervention.  The patient's history has been reviewed, patient examined, no change in status, stable for surgery.  I have reviewed the patient's chart and labs.  Questions were answered to the patient's satisfaction.     Rhae Hammock

## 2021-05-12 ENCOUNTER — Encounter (HOSPITAL_COMMUNITY): Payer: Self-pay | Admitting: Orthopedic Surgery

## 2021-05-12 DIAGNOSIS — S71001S Unspecified open wound, right hip, sequela: Secondary | ICD-10-CM | POA: Diagnosis not present

## 2021-05-12 DIAGNOSIS — D649 Anemia, unspecified: Secondary | ICD-10-CM | POA: Diagnosis not present

## 2021-05-12 DIAGNOSIS — E1159 Type 2 diabetes mellitus with other circulatory complications: Secondary | ICD-10-CM | POA: Diagnosis not present

## 2021-05-12 DIAGNOSIS — E43 Unspecified severe protein-calorie malnutrition: Secondary | ICD-10-CM | POA: Diagnosis not present

## 2021-05-12 LAB — BASIC METABOLIC PANEL
BUN: 48 mg/dL — ABNORMAL HIGH (ref 8–23)
CO2: 27 mmol/L (ref 22–32)
Calcium: 8.3 mg/dL — ABNORMAL LOW (ref 8.9–10.3)
Chloride: 107 mmol/L (ref 98–111)
Creatinine, Ser: 1.44 mg/dL — ABNORMAL HIGH (ref 0.44–1.00)
GFR, Estimated: 36 mL/min — ABNORMAL LOW (ref >60)
Glucose, Bld: 119 mg/dL — ABNORMAL HIGH (ref 70–99)
Potassium: 4.5 mmol/L (ref 3.5–5.1)
Sodium: 136 mmol/L (ref 135–145)

## 2021-05-12 LAB — METHYLMALONIC ACID, SERUM: Methylmalonic Acid, Quantitative: 385 nmol/L — ABNORMAL HIGH (ref 0–378)

## 2021-05-12 LAB — CBC
HCT: 23.8 % — ABNORMAL LOW (ref 36.0–46.0)
Hemoglobin: 7.2 g/dL — ABNORMAL LOW (ref 12.0–15.0)
MCH: 30.9 pg (ref 26.0–34.0)
MCHC: 30.3 g/dL (ref 30.0–36.0)
MCV: 102.1 fL — ABNORMAL HIGH (ref 80.0–100.0)
Platelets: 201 10*3/uL (ref 150–400)
RBC: 2.33 MIL/uL — ABNORMAL LOW (ref 3.87–5.11)
RDW: 13.3 % (ref 11.5–15.5)
WBC: 5.9 10*3/uL (ref 4.0–10.5)
nRBC: 0 % (ref 0.0–0.2)

## 2021-05-12 LAB — GLUCOSE, CAPILLARY
Glucose-Capillary: 104 mg/dL — ABNORMAL HIGH (ref 70–99)
Glucose-Capillary: 90 mg/dL (ref 70–99)
Glucose-Capillary: 141 mg/dL — ABNORMAL HIGH (ref 70–99)
Glucose-Capillary: 160 mg/dL — ABNORMAL HIGH (ref 70–99)

## 2021-05-12 LAB — VANCOMYCIN, RANDOM: Vancomycin Rm: 11

## 2021-05-12 MED ORDER — VANCOMYCIN HCL 1250 MG/250ML IV SOLN
1250.0000 mg | INTRAVENOUS | Status: DC
  Administered 2021-05-12: 1250 mg via INTRAVENOUS
  Filled 2021-05-12: qty 250

## 2021-05-12 NOTE — Progress Notes (Signed)
   05/12/21 1243  Mobility  Range of Motion/Exercises Right leg;Left leg  Mobility Response Tolerated well  Mobility performed by Mobility specialist  $Mobility charge 1 Mobility   Pt agreeable to LE exercises this morning. Conducted bilateral leg abductions, heel slides and lateral leg raises. All exercises x5 on each side. Pt tolerated well, but stated she was feeling stiff while doing exercises which led to some pain. Otherwise no complaints. RN notified of session.   [name] Specialist Acute Rehab Services Office: [phone]

## 2021-05-12 NOTE — Progress Notes (Signed)
Pharmacy Antibiotic Note  Penny [name] is a 86 y.o. female admitted on 05/08/2021 with R hip wound infection.  Pharmacy has been consulted for vancomycin and cefepime dosing. Patient was taken for I&D of wound 10/27. Per Op note, there was purulence in the wound but appeared clean after debridement.  Scr stable at 1.44 today (down from 1.47), baseline appears <1 to 1.  Vancomycin random this AM was 11.   Plan: Continue cefepime to 2g q24 hours Start vancomycin 1250mg  IV q48 hours (eAUC 517, Cmin 11, Scr 1.44) Check vancomycin levels as needed, goal AUC 400-550 Monitor renal function F/u wound culture  Height: 5\' 5"  (165.1 cm) Weight: 64.2 kg (141 lb 8.6 oz) IBW/kg (Calculated) : 57  Temp (24hrs), Avg:98.1 F (36.7 C), Min:97.8 F (36.6 C), Max:98.5 F (36.9 C)  Recent Labs  Lab 05/08/21 1625 05/09/21 0544 05/10/21 0458 05/11/21 1305 05/12/21 0439  WBC 7.2 4.5 4.6 7.6 5.9  CREATININE 1.04* 0.97 1.11* 1.47* 1.44*  VANCORANDOM  --   --   --   --  11     Estimated Creatinine Clearance: 26.2 mL/min (A) (by C-G formula based on SCr of 1.44 mg/dL (H)).    Allergies  Allergen Reactions   Glipizide Rash    Antimicrobials this admission:  10/24 Vanc >> 10/24 Cefepime >>   Dose adjustments this admission:    Microbiology results:  10/27 L hip tissue: moderate gram negative rods, few gram positive cocci  Thank you for allowing pharmacy to be a part of this patient's care.  Dimple Nanas, PharmD 05/12/2021 7:10 AM

## 2021-05-12 NOTE — Progress Notes (Signed)
PROGRESS NOTE    Penny [name]  ZSW:[id number] DOB: [birth year] DOA: 05/08/2021 PCP: Kelton Pillar, MD   Brief Narrative: Penny [name] is a 86 y.o. female with a history of chronic right hip wound, diabetes mellitus type 2, anemia of chronic disease. Patient presented secondary to worsening right hip wound with concern for abscess and cellulitis. IV antibiotics started. Patient underwent I&D on 10/27. Cultures pending.   Assessment & Plan:   Principal Problem:   Complicated open wound of right hip Active Problems:   Type 2 diabetes mellitus with vascular disease (HCC)   Acute prerenal azotemia   Acute on chronic anemia   Dementia (HCC)   Pressure injury of right hip, stage 4 (HCC)   Protein-calorie malnutrition, severe   Right hip wound/cellulitis Right hip/lateral gluteal abscess Chronic wound with evidence of abscess on CT imaging. Started empirically on Vancomycin and Cefepime IV. No blood cultures obtained. Patient underwent I&D with wound vac placement by orthopedic surgery on 10/27. Wound cultures obtained and are pending -Continue Vancomycin and Cefepime pending wound culture results  Dehydration Treated with IV fluids. Improved.  Diabetes mellitus, type 2 Patient is on metformin as an outpatient. Hemoglobin A1C of 5.6%.  Iron deficiency anemia Iron panel suggests iron deficiency component. Ferric gluconate given. Hemoglobin trended down. -CBC in AM; transfuse for hemoglobin <7  Primary hypertension -Continue diltiazem and hydralazine  Cognitive impairment Would benefit from outpatient neurology/neuropsychology as an outpatient.  Pressure injury Stage 4, right hip. POA  Hypotension Post-op. BP down to 57/32 necessitating initiation of Levophed. Levophed weaned off after 30 minutes. Resolved.  Abdominal distension Mild abdominal pain. Possible an aspect of constipation. Confirmed large stool burden on x-ray -Bowel regimen  AKI on CKD stage IIIa Mild.  Creatinine stabilized -BMP in AM -Continue LR IV fluids  DVT prophylaxis: SCDs Code Status:   Code Status: DNR Family Communication: Niece at bedside Disposition Plan: Discharge to SNF pending transition to oral antibiotics   Consultants:  Orthopedic surgery  Procedures:  None  Antimicrobials: Vancomycin Cefepime    Subjective: No issues overnight. Some leg pain  Objective: Vitals:   05/11/21 1152 05/11/21 2029 05/12/21 0305 05/12/21 1413  BP: (!) 115/54 (!) 122/59 117/70 138/70  Pulse: (!) 55 68 66 76  Resp: 14 14 20 20   Temp: 98.2 F (36.8 C) 98.5 F (36.9 C) 97.9 F (36.6 C) 97.7 F (36.5 C)  TempSrc: Oral Oral Oral Oral  SpO2: 100% 94% 98% 92%  Weight:      Height:        Intake/Output Summary (Last 24 hours) at 05/12/2021 1503 Last data filed at 05/12/2021 1400 Gross per 24 hour  Intake 810.75 ml  Output 676 ml  Net 134.75 ml    Filed Weights   05/09/21 2015 05/10/21 0109 05/11/21 0451  Weight: 65.2 kg 65.2 kg 64.2 kg    Examination:  General exam: Appears calm and comfortable Respiratory system: Clear to auscultation. Respiratory effort normal. Cardiovascular system: S1 & S2 heard, RRR. Gastrointestinal system: Abdomen is distended, soft and nontender. No organomegaly or masses felt. Normal bowel sounds heard. Central nervous system: Alert and oriented. No focal neurological deficits. Musculoskeletal: No edema. Mild calf tenderness without welling, warmth, or erythema. Right hip wound with overlying wound vac dressing Skin: No cyanosis. No rashes Psychiatry: Judgement and insight appear normal. Mood & affect appropriate.    Data Reviewed: I have personally reviewed following labs and imaging studies  CBC Lab Results  Component Value Date  WBC 5.9 05/12/2021   RBC 2.33 (L) 05/12/2021   HGB 7.2 (L) 05/12/2021   HCT 23.8 (L) 05/12/2021   MCV 102.1 (H) 05/12/2021   MCH 30.9 05/12/2021   PLT 201 05/12/2021   MCHC 30.3 05/12/2021   RDW  13.3 05/12/2021   LYMPHSABS 0.6 (L) 05/10/2021   MONOABS 0.6 05/10/2021   EOSABS 0.3 05/10/2021   BASOSABS 0.0 86/76/7209     Last metabolic panel Lab Results  Component Value Date   NA 136 05/12/2021   K 4.5 05/12/2021   CL 107 05/12/2021   CO2 27 05/12/2021   BUN 48 (H) 05/12/2021   CREATININE 1.44 (H) 05/12/2021   GLUCOSE 119 (H) 05/12/2021   GFRNONAA 36 (L) 05/12/2021   CALCIUM 8.3 (L) 05/12/2021   PHOS 3.5 04/18/2021   PROT 6.2 (L) 05/09/2021   ALBUMIN 2.1 (L) 05/09/2021   BILITOT 0.4 05/09/2021   ALKPHOS 38 05/09/2021   AST 14 (L) 05/09/2021   ALT 15 05/09/2021   ANIONGAP 5 05/11/2021    CBG (last 3)  Recent Labs    05/11/21 2031 05/12/21 0816 05/12/21 1139  GLUCAP 127* 104* 90      GFR: Estimated Creatinine Clearance: 26.2 mL/min (A) (by C-G formula based on SCr of 1.44 mg/dL (H)).  Coagulation Profile: Recent Labs  Lab 05/09/21 0544  INR 1.2     Recent Results (from the past 240 hour(s))  Resp Panel by RT-PCR (Flu A&B, Covid) Nasopharyngeal Swab     Status: None   Collection Time: 05/08/21  8:50 PM   Specimen: Nasopharyngeal Swab; Nasopharyngeal(NP) swabs in vial transport medium  Result Value Ref Range Status   SARS Coronavirus 2 by RT PCR NEGATIVE NEGATIVE Final    Comment: (NOTE) SARS-CoV-2 target nucleic acids are NOT DETECTED.  The SARS-CoV-2 RNA is generally detectable in upper respiratory specimens during the acute phase of infection. The lowest concentration of SARS-CoV-2 viral copies this assay can detect is 138 copies/mL. A negative result does not preclude SARS-Cov-2 infection and should not be used as the sole basis for treatment or other patient management decisions. A negative result may occur with  improper specimen collection/handling, submission of specimen other than nasopharyngeal swab, presence of viral mutation(s) within the areas targeted by this assay, and inadequate number of viral copies(<138 copies/mL). A negative  result must be combined with clinical observations, patient history, and epidemiological information. The expected result is Negative.  Fact Sheet for Patients:  EntrepreneurPulse.com.au  Fact Sheet for Healthcare Providers:  IncredibleEmployment.be  This test is no t yet approved or cleared by the [place] FDA and  has been authorized for detection and/or diagnosis of SARS-CoV-2 by FDA under an Emergency Use Authorization (EUA). This EUA will remain  in effect (meaning this test can be used) for the duration of the COVID-19 declaration under Section 564(b)(1) of the Act, 21 U.S.C.section 360bbb-3(b)(1), unless the authorization is terminated  or revoked sooner.       Influenza A by PCR NEGATIVE NEGATIVE Final   Influenza B by PCR NEGATIVE NEGATIVE Final    Comment: (NOTE) The Xpert Xpress SARS-CoV-2/FLU/RSV plus assay is intended as an aid in the diagnosis of influenza from Nasopharyngeal swab specimens and should not be used as a sole basis for treatment. Nasal washings and aspirates are unacceptable for Xpert Xpress SARS-CoV-2/FLU/RSV testing.  Fact Sheet for Patients: EntrepreneurPulse.com.au  Fact Sheet for Healthcare Providers: IncredibleEmployment.be  This test is not yet approved or cleared by the [place] FDA  and has been authorized for detection and/or diagnosis of SARS-CoV-2 by FDA under an Emergency Use Authorization (EUA). This EUA will remain in effect (meaning this test can be used) for the duration of the COVID-19 declaration under Section 564(b)(1) of the Act, 21 U.S.C. section 360bbb-3(b)(1), unless the authorization is terminated or revoked.  Performed at [facility], [place] [address], [place], [place] [address]   MRSA Next Gen by PCR, Nasal     Status: Abnormal   Collection Time: 05/11/21  4:00 AM   Specimen: Nasal Mucosa; Nasal Swab  Result Value Ref  Range Status   MRSA by PCR Next Gen DETECTED (A) NOT DETECTED Final    Comment: RESULT CALLED TO, READ BACK BY AND VERIFIED WITH: YUNT,H. RN @1135  ON 10.27.2022 BY NMCCOY (NOTE) The GeneXpert MRSA Assay (FDA approved for NASAL specimens only), is one component of a comprehensive MRSA colonization surveillance program. It is not intended to diagnose MRSA infection nor to guide or monitor treatment for MRSA infections. Test performance is not FDA approved in patients less than 52 years old. Performed at [facility], [place] [address], [place], [place] [address]   Aerobic/Anaerobic Culture w Gram Stain (surgical/deep wound)     Status: None (Preliminary result)   Collection Time: 05/11/21  8:03 AM   Specimen: PATH Other; Tissue  Result Value Ref Range Status   Specimen Description   Final    TISSUE RIGHT HIP Performed at [facility] [address] [place], [place] [address]    Special Requests   Final    NONE Performed at [facility], [place] [address], [place], [place] [address]    Gram Stain   Final    MODERATE WBC PRESENT, PREDOMINANTLY PMN MODERATE GRAM NEGATIVE RODS FEW GRAM POSITIVE COCCI    Culture   Final    CULTURE REINCUBATED FOR BETTER GROWTH Performed at [facility], [place] [address], [place], [place] [address]    Report Status PENDING  Incomplete         Radiology Studies: DG Abd Portable 1V  Result Date: 05/11/2021 CLINICAL DATA:  Abdominal distension. EXAM: PORTABLE ABDOMEN - 1 VIEW COMPARISON:  CT abdomen and pelvis report only 01/13/2000. FINDINGS: There is gaseous distention of the stomach. Large and small bowel loops are nondilated. There is a very large amount of stool throughout the colon. No suspicious calcifications are identified. Visualized lung bases are clear. No acute fractures are seen. Degenerative changes affect the hips. IMPRESSION: 1. Gaseous distention of the stomach, nonspecific.  2. Large amount of stool throughout the colon. Electronically Signed   By: Ronney Asters M.D.   On: 05/11/2021 19:14        Scheduled Meds:  diltiazem  240 mg Oral Daily   docusate sodium  100 mg Oral BID   feeding supplement  237 mL Oral BID BM   hydrALAZINE  25 mg Oral Q8H   hydrocerin   Topical BID   insulin aspart  0-9 Units Subcutaneous TID WC   multivitamin with minerals  1 tablet Oral Daily   nutrition supplement (JUVEN)  1 packet Oral BID BM   polyethylene glycol  17 g Oral Daily   Continuous Infusions:  ceFEPime (MAXIPIME) IV Stopped (05/11/21 2206)   lactated ringers 50 mL/hr at 05/11/21 0731   methocarbamol (ROBAXIN) IV     vancomycin 1,250 mg (05/12/21 0830)     LOS: 4 days     Cordelia Poche,  MD [facility] 05/12/2021, 3:03 PM  If 7PM-7AM, please contact night-coverage [email or web address]

## 2021-05-12 NOTE — Care Management Important Message (Signed)
Medicare IM printed for W/L Social Work to give to the patient. 

## 2021-05-12 NOTE — Progress Notes (Signed)
PATIENT ID: Penny [name]  MRN: [id number]  DOB/AGE:  09/23/1934 / 86 y.o.  1 Day Post-Op Procedure(s) (LRB): IRRIGATION AND DEBRIDEMENT HIP PLACEMENT OF WOUND VAC (Right)  Subjective: Patient reports minimal discomfort in the right glute area. Laying comfortable in bed during rounds.   Objective: Vital signs in last 24 hours: Temp:  [97.8 F (36.6 C)-98.5 F (36.9 C)] 97.9 F (36.6 C) (10/28 0305) Pulse Rate:  [50-68] 66 (10/28 0305) Resp:  [13-26] 20 (10/28 0305) BP: (57-122)/(31-71) 117/70 (10/28 0305) SpO2:  [88 %-100 %] 98 % (10/28 0305)  Intake/Output from previous day: 10/27 0701 - 10/28 0700 In: 1360.8 [I.V.:1160.8; IV Piggyback:200] Out: 75 [Drains:75]   Recent Labs    05/10/21 0458 05/11/21 1305 05/12/21 0439  HGB 7.7* 7.9* 7.2*   Recent Labs    05/11/21 1305 05/12/21 0439  WBC 7.6 5.9  RBC 2.47* 2.33*  HCT 23.7* 23.8*  PLT 238 201   Recent Labs    05/11/21 1305 05/12/21 0439  NA 139 136  K 5.6* 4.5  CL 110 107  CO2 24 27  BUN 46* 48*  CREATININE 1.47* 1.44*  GLUCOSE 120* 119*  CALCIUM 8.4* 8.3*     Physical Exam: Neurovascular intact Dorsiflexion/Plantar flexion intact Compartment soft Wound vac in place over right glute wound  Assessment/Plan: 1 Day Post-Op Procedure(s) (LRB): IRRIGATION AND DEBRIDEMENT HIP PLACEMENT OF WOUND VAC (Right)   Advance diet Up with therapy Weight Bearing as Tolerated (WBAT)  Wound vac functioning well. Will change wound vac tomorrow then wound care RN will take over MWF changes. Patient will likely need to be discharged with wound vac and therefore arrangements will need to be made for facility that has ability to do outpatient wound care as Dr Tamera Punt will not be managing the wound on an outpatient basis.    [name], PA-C 05/12/2021, 7:46 AM

## 2021-05-13 DIAGNOSIS — E1159 Type 2 diabetes mellitus with other circulatory complications: Secondary | ICD-10-CM | POA: Diagnosis not present

## 2021-05-13 DIAGNOSIS — D649 Anemia, unspecified: Secondary | ICD-10-CM | POA: Diagnosis not present

## 2021-05-13 DIAGNOSIS — S71001S Unspecified open wound, right hip, sequela: Secondary | ICD-10-CM | POA: Diagnosis not present

## 2021-05-13 DIAGNOSIS — E43 Unspecified severe protein-calorie malnutrition: Secondary | ICD-10-CM | POA: Diagnosis not present

## 2021-05-13 LAB — CBC
HCT: 25.6 % — ABNORMAL LOW (ref 36.0–46.0)
Hemoglobin: 7.9 g/dL — ABNORMAL LOW (ref 12.0–15.0)
MCH: 31.5 pg (ref 26.0–34.0)
MCHC: 30.9 g/dL (ref 30.0–36.0)
MCV: 102 fL — ABNORMAL HIGH (ref 80.0–100.0)
Platelets: 212 10*3/uL (ref 150–400)
RBC: 2.51 MIL/uL — ABNORMAL LOW (ref 3.87–5.11)
RDW: 13.3 % (ref 11.5–15.5)
WBC: 6.3 10*3/uL (ref 4.0–10.5)
nRBC: 0 % (ref 0.0–0.2)

## 2021-05-13 LAB — BASIC METABOLIC PANEL
Anion gap: <3 — ABNORMAL LOW (ref 5–15)
BUN: 59 mg/dL — ABNORMAL HIGH (ref 8–23)
CO2: 29 mmol/L (ref 22–32)
Calcium: 8.5 mg/dL — ABNORMAL LOW (ref 8.9–10.3)
Chloride: 104 mmol/L (ref 98–111)
Creatinine, Ser: 1.16 mg/dL — ABNORMAL HIGH (ref 0.44–1.00)
GFR, Estimated: 46 mL/min — ABNORMAL LOW (ref >60)
Glucose, Bld: 122 mg/dL — ABNORMAL HIGH (ref 70–99)
Potassium: 4.4 mmol/L (ref 3.5–5.1)
Sodium: 135 mmol/L (ref 135–145)

## 2021-05-13 LAB — GLUCOSE, CAPILLARY
Glucose-Capillary: 125 mg/dL — ABNORMAL HIGH (ref 70–99)
Glucose-Capillary: 156 mg/dL — ABNORMAL HIGH (ref 70–99)
Glucose-Capillary: 142 mg/dL — ABNORMAL HIGH (ref 70–99)
Glucose-Capillary: 188 mg/dL — ABNORMAL HIGH (ref 70–99)

## 2021-05-13 MED ORDER — SODIUM CHLORIDE 0.9 % IV SOLN
2.0000 g | Freq: Two times a day (BID) | INTRAVENOUS | Status: DC
  Administered 2021-05-13 – 2021-05-14 (×4): 2 g via INTRAVENOUS
  Filled 2021-05-13 (×5): qty 2

## 2021-05-13 MED ORDER — DIPHENHYDRAMINE HCL 12.5 MG/5ML PO ELIX
12.5000 mg | ORAL_SOLUTION | Freq: Four times a day (QID) | ORAL | Status: DC | PRN
  Administered 2021-05-13: 12.5 mg via ORAL
  Filled 2021-05-13: qty 5

## 2021-05-13 MED ORDER — VANCOMYCIN HCL 1500 MG/300ML IV SOLN
1500.0000 mg | INTRAVENOUS | Status: DC
  Administered 2021-05-14: 1500 mg via INTRAVENOUS
  Filled 2021-05-13: qty 300

## 2021-05-13 NOTE — Procedures (Signed)
PROCEDURAL NOTE  PREPROCEDURAL DIAGNOSIS: Right lateral deep gluteal wound s/p operative debridment  POSTPROCEDURAL DIAGNOSIS: Penny [name]  PROCEDURE: Application of negative-pressure wound therapy, including total application, wound assessment, and instructions for ongoing care; total area < 50 cm2, [address]  SURGEON: Georgeanna Harrison, M.D.  FINDINGS: Deep gluteal wound without purulence or exudate. Healthy appearing granulation tissue at wound borders with early healthy epithelialization superficially.  Well maintained VAC seal after application.  INDICATIONS: Patient is a 86 y.o. female now POD 2 s/p IRRIGATION AND DEBRIDEMENT HIP PLACEMENT OF WOUND VAC.  She is due for VAC removal, wound assessment, and application of new negative pressure wound therapy.  PROCEDURE: Suction tubing sealed and VAC suction paused. Plastic cover and sponge removed. Wound appeared healthy with granulation tissue and no findings concerning for infection. New negative pressure wound therapy device applied. Suction resumed same settings. Patient tolerated this well without complication.   INSTRUCTIONS FOR ONGOING CARE: Subsequent VAC exchanges and wound care per wound care team No further orthopaedic plans No further follow up with orthopaedics upon discharge WBAT, OOB, PT/OT Care per primary team  Georgeanna Harrison M.D. Orthopaedic Surgery [facility]

## 2021-05-13 NOTE — Progress Notes (Signed)
PATIENT ID: Penny [name]  MRN: [id number]  DOB/AGE:  11-14-1934 / 86 y.o.  2 Days Post-Op Procedure(s) (LRB): IRRIGATION AND DEBRIDEMENT HIP PLACEMENT OF WOUND VAC (Right)  Subjective: Patient reports minimal discomfort in the right glute area. Laying comfortable in bed during rounds.   Objective: Vital signs in last 24 hours: Temp:  [97.7 F (36.5 C)-98.5 F (36.9 C)] 98.2 F (36.8 C) (10/29 0431) Pulse Rate:  [51-76] 51 (10/29 0431) Resp:  [20-24] 20 (10/29 0431) BP: (117-138)/(58-70) 124/61 (10/29 0431) SpO2:  [92 %-97 %] 97 % (10/29 0431) Weight:  [68.9 kg] 68.9 kg (10/29 0442)  Intake/Output from previous day: 10/28 0701 - 10/29 0700 In: 700 [P.O.:450; IV Piggyback:250] Out: 1051 [Urine:1050; Stool:1]   Recent Labs    05/11/21 1305 05/12/21 0439 05/13/21 0526  HGB 7.9* 7.2* 7.9*    Recent Labs    05/12/21 0439 05/13/21 0526  WBC 5.9 6.3  RBC 2.33* 2.51*  HCT 23.8* 25.6*  PLT 201 212    Recent Labs    05/12/21 0439 05/13/21 0526  NA 136 135  K 4.5 4.4  CL 107 104  CO2 27 29  BUN 48* 59*  CREATININE 1.44* 1.16*  GLUCOSE 119* 122*  CALCIUM 8.3* 8.5*      Physical Exam: RLE: VAC c/d/I, maintaining suction + DF/PF/EHL SILT SP/DP/T WWP distally  Assessment/Plan: 2 Days Post-Op Procedure(s) (LRB): IRRIGATION AND DEBRIDEMENT HIP PLACEMENT OF WOUND VAC (Right)  Weight Bearing as Tolerated (WBAT) OOB/PT Care per primary team  Wound vac functioning well. Will change wound vac tomorrow then wound care RN will take over MWF changes. Patient will likely need to be discharged with wound vac and therefore arrangements will need to be made for facility that has ability to do outpatient wound care as Dr Tamera Punt will not be managing the wound on an outpatient basis.    Penny [name] M.D. Orthopaedic Surgery [facility]

## 2021-05-13 NOTE — Progress Notes (Signed)
Pharmacy Antibiotic Note  Penny [name] is a 86 y.o. female admitted on 05/08/2021 with R hip wound infection.  Pharmacy has been consulted for vancomycin and cefepime dosing. Patient was taken for I&D of wound 10/27. Per Op note, there was purulence in the wound but appeared clean after debridement.  Scr improved 1.16 today (down from 1.47), baseline appears <1 to 1.  Vancomycin random this AM was 11.   Plan: Increase cefepime to 2g q12 hours Adjust vancomycin 1500mg  IV q48 hours (eAUC 482, Scr 1.16) Check vancomycin levels as needed, goal AUC 400-550 Monitor renal function F/u wound culture  Height: 5\' 5"  (165.1 cm) Weight: 68.9 kg (151 lb 14.4 oz) IBW/kg (Calculated) : 57  Temp (24hrs), Avg:98.1 F (36.7 C), Min:97.7 F (36.5 C), Max:98.5 F (36.9 C)  Recent Labs  Lab 05/09/21 0544 05/10/21 0458 05/11/21 1305 05/12/21 0439 05/13/21 0526  WBC 4.5 4.6 7.6 5.9 6.3  CREATININE 0.97 1.11* 1.47* 1.44* 1.16*  VANCORANDOM  --   --   --  11  --      Estimated Creatinine Clearance: 35.2 mL/min (A) (by C-G formula based on SCr of 1.16 mg/dL (H)).    Allergies  Allergen Reactions   Glipizide Rash    Antimicrobials this admission:  10/24 Vanc >> 10/24 Cefepime >>   Dose adjustments this admission:  10/18: Vancomycin random = 11   Microbiology results:  10/27 L hip tissue: moderate gram negative rods, few gram positive cocci (reincubating)  Thank you for allowing pharmacy to be a part of this patient's care.  Peggyann Juba, PharmD, BCPS Pharmacy: [phone] 05/13/2021 11:44 AM

## 2021-05-13 NOTE — Progress Notes (Signed)
Pt stable with no needs on arrival to floor. Pt wound vac wnl. Assessment performed. Rn will continue to monitor.

## 2021-05-13 NOTE — Progress Notes (Signed)
PROGRESS NOTE    KINZEY SHERIFF  GUR:[id number] DOB: [birth year] DOA: 05/08/2021 PCP: Kelton Pillar, MD   Brief Narrative: Penny [name] is a 86 y.o. female with a history of chronic right hip wound, diabetes mellitus type 2, anemia of chronic disease. Patient presented secondary to worsening right hip wound with concern for abscess and cellulitis. IV antibiotics started. Patient underwent I&D on 10/27. Cultures pending.   Assessment & Plan:   Principal Problem:   Complicated open wound of right hip Active Problems:   Type 2 diabetes mellitus with vascular disease (HCC)   Acute prerenal azotemia   Acute on chronic anemia   Dementia (HCC)   Pressure injury of right hip, stage 4 (HCC)   Protein-calorie malnutrition, severe   Right hip wound/cellulitis Right hip/lateral gluteal abscess Chronic wound with evidence of abscess on CT imaging. Started empirically on Vancomycin and Cefepime IV. No blood cultures obtained. Patient underwent I&D with wound vac placement by orthopedic surgery on 10/27; exchanged on 10/29. Wound cultures obtained and are pending -Continue Vancomycin and Cefepime pending wound culture results  Dehydration Treated with IV fluids. Improved.  Diabetes mellitus, type 2 Patient is on metformin as an outpatient. Hemoglobin A1C of 5.6%.  Iron deficiency anemia Iron panel suggests iron deficiency component. Ferric gluconate given. Hemoglobin stable.  Primary hypertension -Continue diltiazem and hydralazine  Cognitive impairment Would benefit from outpatient neurology/neuropsychology as an outpatient.  Pressure injury Stage 4, right hip. POA  Hypotension Post-op. BP down to 57/32 necessitating initiation of Levophed. Levophed weaned off after 30 minutes. Resolved.  Abdominal distension Mild abdominal pain. Possible an aspect of constipation. Confirmed large stool burden on x-ray -Bowel regimen  AKI on CKD stage IIIa Mild. AKI resolved.   DVT  prophylaxis: SCDs Code Status:   Code Status: DNR Family Communication: Niece at bedside Disposition Plan: Discharge to SNF pending transition to oral antibiotics   Consultants:  Orthopedic surgery  Procedures:  None  Antimicrobials: Vancomycin Cefepime    Subjective: No concerns overnight. Unhappy about not getting her breakfast and some pain of her right hip.  Objective: Vitals:   05/12/21 1413 05/12/21 2020 05/13/21 0431 05/13/21 0442  BP: 138/70 (!) 117/58 124/61   Pulse: 76 61 (!) 51   Resp: 20 (!) 24 20   Temp: 97.7 F (36.5 C) 98.5 F (36.9 C) 98.2 F (36.8 C)   TempSrc: Oral Oral Oral   SpO2: 92% 97% 97%   Weight:    68.9 kg  Height:        Intake/Output Summary (Last 24 hours) at 05/13/2021 1353 Last data filed at 05/13/2021 0500 Gross per 24 hour  Intake 250 ml  Output 451 ml  Net -201 ml    Filed Weights   05/10/21 0109 05/11/21 0451 05/13/21 0442  Weight: 65.2 kg 64.2 kg 68.9 kg    Examination:  General exam: Appears calm and comfortable Respiratory system: Clear to auscultation. Respiratory effort normal. Cardiovascular system: S1 & S2 heard, RRR. Gastrointestinal system: Abdomen is nondistended, soft and nontender. No organomegaly or masses felt. Normal bowel sounds heard. Central nervous system: Alert and oriented. No focal neurological deficits. Musculoskeletal: No edema. No calf tenderness Skin: No cyanosis. No rashes. Right lateral hip with wound vac in place Psychiatry: Judgement and insight appear normal. Mood & affect appropriate.    Data Reviewed: I have personally reviewed following labs and imaging studies  CBC Lab Results  Component Value Date   WBC 6.3 05/13/2021   RBC  2.51 (L) 05/13/2021   HGB 7.9 (L) 05/13/2021   HCT 25.6 (L) 05/13/2021   MCV 102.0 (H) 05/13/2021   MCH 31.5 05/13/2021   PLT 212 05/13/2021   MCHC 30.9 05/13/2021   RDW 13.3 05/13/2021   LYMPHSABS 0.6 (L) 05/10/2021   MONOABS 0.6 05/10/2021   EOSABS  0.3 05/10/2021   BASOSABS 0.0 91/63/8466     Last metabolic panel Lab Results  Component Value Date   NA 135 05/13/2021   K 4.4 05/13/2021   CL 104 05/13/2021   CO2 29 05/13/2021   BUN 59 (H) 05/13/2021   CREATININE 1.16 (H) 05/13/2021   GLUCOSE 122 (H) 05/13/2021   GFRNONAA 46 (L) 05/13/2021   CALCIUM 8.5 (L) 05/13/2021   PHOS 3.5 04/18/2021   PROT 6.2 (L) 05/09/2021   ALBUMIN 2.1 (L) 05/09/2021   BILITOT 0.4 05/09/2021   ALKPHOS 38 05/09/2021   AST 14 (L) 05/09/2021   ALT 15 05/09/2021   ANIONGAP <3 (L) 05/13/2021    CBG (last 3)  Recent Labs    05/12/21 2026 05/13/21 0730 05/13/21 1151  GLUCAP 160* 125* 156*      GFR: Estimated Creatinine Clearance: 35.2 mL/min (A) (by C-G formula based on SCr of 1.16 mg/dL (H)).  Coagulation Profile: Recent Labs  Lab 05/09/21 0544  INR 1.2     Recent Results (from the past 240 hour(s))  Resp Panel by RT-PCR (Flu A&B, Covid) Nasopharyngeal Swab     Status: None   Collection Time: 05/08/21  8:50 PM   Specimen: Nasopharyngeal Swab; Nasopharyngeal(NP) swabs in vial transport medium  Result Value Ref Range Status   SARS Coronavirus 2 by RT PCR NEGATIVE NEGATIVE Final    Comment: (NOTE) SARS-CoV-2 target nucleic acids are NOT DETECTED.  The SARS-CoV-2 RNA is generally detectable in upper respiratory specimens during the acute phase of infection. The lowest concentration of SARS-CoV-2 viral copies this assay can detect is 138 copies/mL. A negative result does not preclude SARS-Cov-2 infection and should not be used as the sole basis for treatment or other patient management decisions. A negative result may occur with  improper specimen collection/handling, submission of specimen other than nasopharyngeal swab, presence of viral mutation(s) within the areas targeted by this assay, and inadequate number of viral copies(<138 copies/mL). A negative result must be combined with clinical observations, patient history, and  epidemiological information. The expected result is Negative.  Fact Sheet for Patients:  EntrepreneurPulse.com.au  Fact Sheet for Healthcare Providers:  IncredibleEmployment.be  This test is no t yet approved or cleared by the [place] FDA and  has been authorized for detection and/or diagnosis of SARS-CoV-2 by FDA under an Emergency Use Authorization (EUA). This EUA will remain  in effect (meaning this test can be used) for the duration of the COVID-19 declaration under Section 564(b)(1) of the Act, 21 U.S.C.section 360bbb-3(b)(1), unless the authorization is terminated  or revoked sooner.       Influenza A by PCR NEGATIVE NEGATIVE Final   Influenza B by PCR NEGATIVE NEGATIVE Final    Comment: (NOTE) The Xpert Xpress SARS-CoV-2/FLU/RSV plus assay is intended as an aid in the diagnosis of influenza from Nasopharyngeal swab specimens and should not be used as a sole basis for treatment. Nasal washings and aspirates are unacceptable for Xpert Xpress SARS-CoV-2/FLU/RSV testing.  Fact Sheet for Patients: EntrepreneurPulse.com.au  Fact Sheet for Healthcare Providers: IncredibleEmployment.be  This test is not yet approved or cleared by the [place] and has been authorized for  detection and/or diagnosis of SARS-CoV-2 by FDA under an Emergency Use Authorization (EUA). This EUA will remain in effect (meaning this test can be used) for the duration of the COVID-19 declaration under Section 564(b)(1) of the Act, 21 U.S.C. section 360bbb-3(b)(1), unless the authorization is terminated or revoked.  Performed at [facility], [place] [address], [place], [place] [address]   MRSA Next Gen by PCR, Nasal     Status: Abnormal   Collection Time: 05/11/21  4:00 AM   Specimen: Nasal Mucosa; Nasal Swab  Result Value Ref Range Status   MRSA by PCR Next Gen DETECTED (A) NOT DETECTED Final     Comment: RESULT CALLED TO, READ BACK BY AND VERIFIED WITH: YUNT,H. RN @1135  ON 10.27.2022 BY NMCCOY (NOTE) The GeneXpert MRSA Assay (FDA approved for NASAL specimens only), is one component of a comprehensive MRSA colonization surveillance program. It is not intended to diagnose MRSA infection nor to guide or monitor treatment for MRSA infections. Test performance is not FDA approved in patients less than 35 years old. Performed at [facility], [place] [address], [place], [place] [address]   Aerobic/Anaerobic Culture w Gram Stain (surgical/deep wound)     Status: None (Preliminary result)   Collection Time: 05/11/21  8:03 AM   Specimen: PATH Other; Tissue  Result Value Ref Range Status   Specimen Description   Final    TISSUE RIGHT HIP Performed at [facility] [address], [place], [place] [address]    Special Requests   Final    NONE Performed at [facility], [place] [address] [place], [place] [address]    Gram Stain   Final    MODERATE WBC PRESENT, PREDOMINANTLY PMN MODERATE GRAM NEGATIVE RODS FEW GRAM POSITIVE COCCI    Culture   Final    CULTURE REINCUBATED FOR BETTER GROWTH Performed at [facility], [place] [address], [place],  [address]    Report Status PENDING  Incomplete         Radiology Studies: DG Abd Portable 1V  Result Date: 05/11/2021 CLINICAL DATA:  Abdominal distension. EXAM: PORTABLE ABDOMEN - 1 VIEW COMPARISON:  CT abdomen and pelvis report only 01/13/2000. FINDINGS: There is gaseous distention of the stomach. Large and small bowel loops are nondilated. There is a very large amount of stool throughout the colon. No suspicious calcifications are identified. Visualized lung bases are clear. No acute fractures are seen. Degenerative changes affect the hips. IMPRESSION: 1. Gaseous distention of the stomach, nonspecific. 2. Large amount of stool throughout the colon. Electronically Signed    By: Ronney Asters M.D.   On: 05/11/2021 19:14        Scheduled Meds:  diltiazem  240 mg Oral Daily   docusate sodium  100 mg Oral BID   feeding supplement  237 mL Oral BID BM   hydrALAZINE  25 mg Oral Q8H   hydrocerin   Topical BID   insulin aspart  0-9 Units Subcutaneous TID WC   multivitamin with minerals  1 tablet Oral Daily   nutrition supplement (JUVEN)  1 packet Oral BID BM   polyethylene glycol  17 g Oral Daily   Continuous Infusions:  ceFEPime (MAXIPIME) IV 2 g (05/13/21 1247)   lactated ringers 50 mL/hr at 05/11/21 0731   methocarbamol (ROBAXIN) IV     [START ON 05/14/2021] vancomycin       LOS: 5 days     Cordelia Poche, MD [facility]  05/13/2021, 1:53 PM  If 7PM-7AM, please contact night-coverage [email or web address]

## 2021-05-14 DIAGNOSIS — E43 Unspecified severe protein-calorie malnutrition: Secondary | ICD-10-CM | POA: Diagnosis not present

## 2021-05-14 DIAGNOSIS — D649 Anemia, unspecified: Secondary | ICD-10-CM | POA: Diagnosis not present

## 2021-05-14 DIAGNOSIS — S71001S Unspecified open wound, right hip, sequela: Secondary | ICD-10-CM | POA: Diagnosis not present

## 2021-05-14 DIAGNOSIS — E1159 Type 2 diabetes mellitus with other circulatory complications: Secondary | ICD-10-CM | POA: Diagnosis not present

## 2021-05-14 LAB — AEROBIC/ANAEROBIC CULTURE W GRAM STAIN (SURGICAL/DEEP WOUND)

## 2021-05-14 LAB — GLUCOSE, CAPILLARY
Glucose-Capillary: 248 mg/dL — ABNORMAL HIGH (ref 70–99)
Glucose-Capillary: 148 mg/dL — ABNORMAL HIGH (ref 70–99)
Glucose-Capillary: 128 mg/dL — ABNORMAL HIGH (ref 70–99)
Glucose-Capillary: 183 mg/dL — ABNORMAL HIGH (ref 70–99)

## 2021-05-14 LAB — CBC
HCT: 27 % — ABNORMAL LOW (ref 36.0–46.0)
Hemoglobin: 8.2 g/dL — ABNORMAL LOW (ref 12.0–15.0)
MCH: 31.7 pg (ref 26.0–34.0)
MCHC: 30.4 g/dL (ref 30.0–36.0)
MCV: 104.2 fL — ABNORMAL HIGH (ref 80.0–100.0)
Platelets: 126 10*3/uL — ABNORMAL LOW (ref 150–400)
RBC: 2.59 MIL/uL — ABNORMAL LOW (ref 3.87–5.11)
RDW: 13.4 % (ref 11.5–15.5)
WBC: 4.9 10*3/uL (ref 4.0–10.5)
nRBC: 0 % (ref 0.0–0.2)

## 2021-05-14 MED ORDER — CHLORHEXIDINE GLUCONATE CLOTH 2 % EX PADS
6.0000 | MEDICATED_PAD | Freq: Every day | CUTANEOUS | Status: DC
  Administered 2021-05-15 – 2021-05-16 (×2): 6 via TOPICAL

## 2021-05-14 NOTE — Plan of Care (Signed)
  Problem: Clinical Measurements: Goal: Respiratory complications will improve Outcome: Progressing   Problem: Clinical Measurements: Goal: Cardiovascular complication will be avoided Outcome: Progressing   

## 2021-05-14 NOTE — Progress Notes (Signed)
PROGRESS NOTE    Penny [name]  DGL:[id number] DOB: 09-09-1934 DOA: 05/08/2021 PCP: Kelton Pillar, MD   Brief Narrative: Penny [name] is a 86 y.o. female with a history of chronic right hip wound, diabetes mellitus type 2, anemia of chronic disease. Patient presented secondary to worsening right hip wound with concern for abscess and cellulitis. IV antibiotics started. Patient underwent I&D on 10/27. Cultures pending.   Assessment & Plan:   Principal Problem:   Complicated open wound of right hip Active Problems:   Type 2 diabetes mellitus with vascular disease (HCC)   Acute prerenal azotemia   Acute on chronic anemia   Dementia (HCC)   Pressure injury of right hip, stage 4 (HCC)   Protein-calorie malnutrition, severe   Right hip wound/cellulitis Right hip/lateral gluteal abscess Chronic wound with evidence of abscess on CT imaging. Started empirically on Vancomycin and Cefepime IV. No blood cultures obtained. Patient underwent I&D with wound vac placement by orthopedic surgery on 10/27; exchanged on 10/29. Wound cultures obtained and are pending -Continue Vancomycin and Cefepime pending wound culture results  Dehydration Treated with IV fluids. Improved.  Diabetes mellitus, type 2 Patient is on metformin as an outpatient. Hemoglobin A1C of 5.6%.  Iron deficiency anemia Iron panel suggests iron deficiency component. Ferric gluconate given. Hemoglobin stable.  Primary hypertension -Continue diltiazem and hydralazine  Cognitive impairment Would benefit from outpatient neurology/neuropsychology as an outpatient.  Pressure injury Stage 4, right hip. POA  Hypotension Post-op. BP down to 57/32 necessitating initiation of Levophed. Levophed weaned off after 30 minutes. Resolved.  Abdominal distension Mild abdominal pain. Possible an aspect of constipation. Confirmed large stool burden on x-ray -Bowel regimen  AKI on CKD stage IIIa Mild. AKI  resolved.  Thrombocytopenia Mild. Likely reactive. Patient is not on heparin nor Lovenox.   DVT prophylaxis: SCDs Code Status:   Code Status: DNR Family Communication: None at bedside Disposition Plan: Discharge to SNF pending transition to oral antibiotics   Consultants:  Orthopedic surgery  Procedures:  None  Antimicrobials: Vancomycin Cefepime    Subjective: No issues noted overnight. Afebrile. Itching managed with benadryl. Pain managed sparingly with Norco and Tylenol.  Objective: Vitals:   05/13/21 0431 05/13/21 0442 05/13/21 1419 05/13/21 2204  BP: 124/61  124/63 (!) 146/68  Pulse: (!) 51  (!) 43 (!) 50  Resp: 20  19 20   Temp: 98.2 F (36.8 C)  97.8 F (36.6 C) 98.2 F (36.8 C)  TempSrc: Oral  Oral Oral  SpO2: 97%  97% 98%  Weight:  68.9 kg    Height:        Intake/Output Summary (Last 24 hours) at 05/14/2021 0945 Last data filed at 05/14/2021 0724 Gross per 24 hour  Intake 880 ml  Output 1301 ml  Net -421 ml    Filed Weights   05/10/21 0109 05/11/21 0451 05/13/21 0442  Weight: 65.2 kg 64.2 kg 68.9 kg    Examination:  General: Well appearing, no distress Respiratory: Unlabored work of breathing.    Data Reviewed: I have personally reviewed following labs and imaging studies  CBC Lab Results  Component Value Date   WBC 4.9 05/14/2021   RBC 2.59 (L) 05/14/2021   HGB 8.2 (L) 05/14/2021   HCT 27.0 (L) 05/14/2021   MCV 104.2 (H) 05/14/2021   MCH 31.7 05/14/2021   PLT 126 (L) 05/14/2021   MCHC 30.4 05/14/2021   RDW 13.4 05/14/2021   LYMPHSABS 0.6 (L) 05/10/2021   MONOABS 0.6 05/10/2021  EOSABS 0.3 05/10/2021   BASOSABS 0.0 28/78/6767     Last metabolic panel Lab Results  Component Value Date   NA 135 05/13/2021   K 4.4 05/13/2021   CL 104 05/13/2021   CO2 29 05/13/2021   BUN 59 (H) 05/13/2021   CREATININE 1.16 (H) 05/13/2021   GLUCOSE 122 (H) 05/13/2021   GFRNONAA 46 (L) 05/13/2021   CALCIUM 8.5 (L) 05/13/2021   PHOS 3.5  04/18/2021   PROT 6.2 (L) 05/09/2021   ALBUMIN 2.1 (L) 05/09/2021   BILITOT 0.4 05/09/2021   ALKPHOS 38 05/09/2021   AST 14 (L) 05/09/2021   ALT 15 05/09/2021   ANIONGAP <3 (L) 05/13/2021    CBG (last 3)  Recent Labs    05/13/21 1659 05/13/21 2202 05/14/21 0742  GLUCAP 142* 188* 248*      GFR: Estimated Creatinine Clearance: 35.2 mL/min (A) (by C-G formula based on SCr of 1.16 mg/dL (H)).  Coagulation Profile: Recent Labs  Lab 05/09/21 0544  INR 1.2     Recent Results (from the past 240 hour(s))  Resp Panel by RT-PCR (Flu A&B, Covid) Nasopharyngeal Swab     Status: None   Collection Time: 05/08/21  8:50 PM   Specimen: Nasopharyngeal Swab; Nasopharyngeal(NP) swabs in vial transport medium  Result Value Ref Range Status   SARS Coronavirus 2 by RT PCR NEGATIVE NEGATIVE Final    Comment: (NOTE) SARS-CoV-2 target nucleic acids are NOT DETECTED.  The SARS-CoV-2 RNA is generally detectable in upper respiratory specimens during the acute phase of infection. The lowest concentration of SARS-CoV-2 viral copies this assay can detect is 138 copies/mL. A negative result does not preclude SARS-Cov-2 infection and should not be used as the sole basis for treatment or other patient management decisions. A negative result may occur with  improper specimen collection/handling, submission of specimen other than nasopharyngeal swab, presence of viral mutation(s) within the areas targeted by this assay, and inadequate number of viral copies(<138 copies/mL). A negative result must be combined with clinical observations, patient history, and epidemiological information. The expected result is Negative.  Fact Sheet for Patients:  EntrepreneurPulse.com.au  Fact Sheet for Healthcare Providers:  IncredibleEmployment.be  This test is no t yet approved or cleared by the [place] FDA and  has been authorized for detection and/or diagnosis of  SARS-CoV-2 by FDA under an Emergency Use Authorization (EUA). This EUA will remain  in effect (meaning this test can be used) for the duration of the COVID-19 declaration under Section 564(b)(1) of the Act, 21 U.S.C.section 360bbb-3(b)(1), unless the authorization is terminated  or revoked sooner.       Influenza A by PCR NEGATIVE NEGATIVE Final   Influenza B by PCR NEGATIVE NEGATIVE Final    Comment: (NOTE) The Xpert Xpress SARS-CoV-2/FLU/RSV plus assay is intended as an aid in the diagnosis of influenza from Nasopharyngeal swab specimens and should not be used as a sole basis for treatment. Nasal washings and aspirates are unacceptable for Xpert Xpress SARS-CoV-2/FLU/RSV testing.  Fact Sheet for Patients: EntrepreneurPulse.com.au  Fact Sheet for Healthcare Providers: IncredibleEmployment.be  This test is not yet approved or cleared by the [place] FDA and has been authorized for detection and/or diagnosis of SARS-CoV-2 by FDA under an Emergency Use Authorization (EUA). This EUA will remain in effect (meaning this test can be used) for the duration of the COVID-19 declaration under Section 564(b)(1) of the Act, 21 U.S.C. section 360bbb-3(b)(1), unless the authorization is terminated or revoked.  Performed at [facility], [place] [address], [place], [place] [address]   MRSA Next Gen by PCR, Nasal     Status: Abnormal   Collection Time: 05/11/21  4:00 AM   Specimen: Nasal Mucosa; Nasal Swab  Result Value Ref Range Status   MRSA by PCR Next Gen DETECTED (A) NOT DETECTED Final    Comment: RESULT CALLED TO, READ BACK BY AND VERIFIED WITH: YUNT,H. RN @1135  ON 10.27.2022 BY NMCCOY (NOTE) The GeneXpert MRSA Assay (FDA approved for NASAL specimens only), is one component of a comprehensive MRSA colonization surveillance program. It is not intended to diagnose MRSA infection nor to guide or monitor treatment for MRSA  infections. Test performance is not FDA approved in patients less than 75 years old. Performed at [facility], [place] [address] [place], [place] [address]   Aerobic/Anaerobic Culture w Gram Stain (surgical/deep wound)     Status: None (Preliminary result)   Collection Time: 05/11/21  8:03 AM   Specimen: PATH Other; Tissue  Result Value Ref Range Status   Specimen Description   Final    TISSUE RIGHT HIP Performed at [facility] [address], [place], [place] [address]    Special Requests   Final    NONE Performed at [facility], [place] [address] [place], [place] [address]    Gram Stain   Final    MODERATE WBC PRESENT, PREDOMINANTLY PMN MODERATE GRAM NEGATIVE RODS FEW GRAM POSITIVE COCCI    Culture   Final    RARE STAPHYLOCOCCUS AUREUS RARE ENTEROCOCCUS FAECALIS SUSCEPTIBILITIES TO FOLLOW MODERATE DIPHTHEROIDS(CORYNEBACTERIUM SPECIES) Standardized susceptibility testing for this organism is not available. Performed at [facility], [place] [address] [place], [place] [address]    Report Status PENDING  Incomplete         Radiology Studies: No results found.      Scheduled Meds:  diltiazem  240 mg Oral Daily   docusate sodium  100 mg Oral BID   feeding supplement  237 mL Oral BID BM   hydrALAZINE  25 mg Oral Q8H   hydrocerin   Topical BID   insulin aspart  0-9 Units Subcutaneous TID WC   multivitamin with minerals  1 tablet Oral Daily   nutrition supplement (JUVEN)  1 packet Oral BID BM   polyethylene glycol  17 g Oral Daily   Continuous Infusions:  ceFEPime (MAXIPIME) IV Stopped (05/14/21 0119)   lactated ringers 50 mL/hr at 05/13/21 1603   methocarbamol (ROBAXIN) IV     vancomycin       LOS: 6 days     Cordelia Poche, MD [facility] 05/14/2021, 9:45 AM  If 7PM-7AM, please contact night-coverage [email or web address]

## 2021-05-15 ENCOUNTER — Encounter (HOSPITAL_COMMUNITY): Payer: Self-pay | Admitting: Internal Medicine

## 2021-05-15 DIAGNOSIS — E1159 Type 2 diabetes mellitus with other circulatory complications: Secondary | ICD-10-CM | POA: Diagnosis not present

## 2021-05-15 DIAGNOSIS — E43 Unspecified severe protein-calorie malnutrition: Secondary | ICD-10-CM | POA: Diagnosis not present

## 2021-05-15 DIAGNOSIS — D649 Anemia, unspecified: Secondary | ICD-10-CM | POA: Diagnosis not present

## 2021-05-15 DIAGNOSIS — S71001S Unspecified open wound, right hip, sequela: Secondary | ICD-10-CM | POA: Diagnosis not present

## 2021-05-15 LAB — CBC
HCT: 26.2 % — ABNORMAL LOW (ref 36.0–46.0)
Hemoglobin: 8.3 g/dL — ABNORMAL LOW (ref 12.0–15.0)
MCH: 31.8 pg (ref 26.0–34.0)
MCHC: 31.7 g/dL (ref 30.0–36.0)
MCV: 100.4 fL — ABNORMAL HIGH (ref 80.0–100.0)
Platelets: 224 10*3/uL (ref 150–400)
RBC: 2.61 MIL/uL — ABNORMAL LOW (ref 3.87–5.11)
RDW: 13.5 % (ref 11.5–15.5)
WBC: 4.4 10*3/uL (ref 4.0–10.5)
nRBC: 0 % (ref 0.0–0.2)

## 2021-05-15 LAB — GLUCOSE, CAPILLARY
Glucose-Capillary: 120 mg/dL — ABNORMAL HIGH (ref 70–99)
Glucose-Capillary: 166 mg/dL — ABNORMAL HIGH (ref 70–99)
Glucose-Capillary: 127 mg/dL — ABNORMAL HIGH (ref 70–99)
Glucose-Capillary: 161 mg/dL — ABNORMAL HIGH (ref 70–99)

## 2021-05-15 LAB — CREATININE, SERUM
Creatinine, Ser: 1.12 mg/dL — ABNORMAL HIGH (ref 0.44–1.00)
GFR, Estimated: 48 mL/min — ABNORMAL LOW (ref >60)

## 2021-05-15 MED ORDER — BISACODYL 10 MG RE SUPP
10.0000 mg | Freq: Once | RECTAL | Status: DC
  Filled 2021-05-15: qty 1

## 2021-05-15 MED ORDER — MUPIROCIN 2 % EX OINT
1.0000 "application " | TOPICAL_OINTMENT | Freq: Two times a day (BID) | CUTANEOUS | Status: DC
  Administered 2021-05-15 – 2021-05-16 (×3): 1 via NASAL
  Filled 2021-05-15: qty 22

## 2021-05-15 MED ORDER — AMOXICILLIN 250 MG/5ML PO SUSR
500.0000 mg | Freq: Three times a day (TID) | ORAL | Status: DC
  Administered 2021-05-15: 500 mg via ORAL
  Filled 2021-05-15 (×3): qty 10

## 2021-05-15 MED ORDER — AMOXICILLIN-POT CLAVULANATE 400-57 MG/5ML PO SUSR
875.0000 mg | Freq: Two times a day (BID) | ORAL | Status: DC
  Administered 2021-05-15 – 2021-05-16 (×3): 875 mg via ORAL
  Filled 2021-05-15 (×5): qty 10.9

## 2021-05-15 NOTE — Progress Notes (Signed)
PATIENT ID: Penny [name]  MRN: [id number]  DOB/AGE:  13-Feb-1935 / 86 y.o.  4 Days Post-Op Procedure(s) (LRB): IRRIGATION AND DEBRIDEMENT HIP PLACEMENT OF WOUND VAC (Right)  Subjective: Pain is minimal in the right hip/glute.  No c/o chest pain or SOB. Patient reports that she is a "little uncomfortable cause of the bed" but overall ok.    Objective: Vital signs in last 24 hours: Temp:  [98.2 F (36.8 C)-98.6 F (37 C)] 98.6 F (37 C) (10/31 0455) Pulse Rate:  [50-60] 50 (10/31 0455) Resp:  [16] 16 (10/31 0455) BP: (118-136)/(63-64) 118/63 (10/31 0455) SpO2:  [95 %-98 %] 95 % (10/31 0455) Weight:  [68 kg] 68 kg (10/31 0500)  Intake/Output from previous day: 10/30 0701 - 10/31 0700 In: 4042.5 [P.O.:1620; I.V.:1822.5; IV Piggyback:600] Out: 2950 [Urine:2500; Drains:450]   Recent Labs    05/13/21 0526 05/14/21 0304 05/15/21 0341  HGB 7.9* 8.2* 8.3*   Recent Labs    05/14/21 0304 05/15/21 0341  WBC 4.9 4.4  RBC 2.59* 2.61*  HCT 27.0* 26.2*  PLT 126* 224   Recent Labs    05/13/21 0526 05/15/21 0341  NA 135  --   K 4.4  --   CL 104  --   CO2 29  --   BUN 59*  --   CREATININE 1.16* 1.12*  GLUCOSE 122*  --   CALCIUM 8.5*  --    Physical Exam: Sensation intact distally Intact pulses distally Dorsiflexion/Plantar flexion intact No cellulitis present Compartment soft Wound vac in place over right hip/glute  Assessment/Plan: 4 Days Post-Op Procedure(s) (LRB): IRRIGATION AND DEBRIDEMENT HIP PLACEMENT OF WOUND VAC (Right)   Advance diet as tolerated Discharge to SNF when medically stable Weight Bearing as Tolerated (WBAT)  Plan for wound vac change today by wound care RN team. Changes planned for MWF. Patient will be set up with facility that can manage outpatient wound care upon discharge. Awaiting decision from medicine to determine antibiotics for discharge. Patient will have wound managed by wound care team at facility/outpatient. No planned f/u with Dr  Tamera Punt.    Darshawn Boateng L. [place], PA-C 05/15/2021, 7:51 AM

## 2021-05-15 NOTE — TOC Progression Note (Signed)
Transition of Care [facility]) - Progression Note    Patient Details  Name: Penny [name] MRN: [id number] Date of Birth: 31-Oct-1934  [facility] CM/SW Contact  Lennart Pall, LCSW Phone Number: 05/15/2021, 3:22 PM  Clinical Narrative:    Have spoken with pt's niece, [name], today to update on SNF bed placement.  Confirming with MD/WOC RN this morning that we should anticipate pt will continue to require wound VAC at SNF.  [facility] has confirmed that they ARE able to order and maintain VAC at their facility and can be ready for patient's return tomorrow. Niece aware and is agreed for pt to return to [facility].  Have alerted MD to above and need for COVID test.  I have begun insurance authorization.   Expected Discharge Plan: Skilled Nursing Facility Barriers to Discharge: Continued Medical Work up  Expected Discharge Plan and Services Expected Discharge Plan: [place]                                               Social Determinants of Health (SDOH) Interventions    Readmission Risk Interventions No flowsheet data found.

## 2021-05-15 NOTE — Plan of Care (Signed)

## 2021-05-15 NOTE — Progress Notes (Signed)
Physical Therapy Treatment Patient Details Name: Penny [name] MRN: [id number] DOB: 25-Dec-1934 Today's Date: 05/15/2021   History of Present Illness Patient is a 86 year old female who presented with worsening right thigh wound from SNF where patient was getting short term rehab. patient was also found to be dehydrated. PMH: type II DM, cog impariment, anemia, 10/3-10/7 hypothermia.    PT Comments    Pt very pleasant and wanting to get better and work with PT. Pt presented with great stiffness in B LEs with limited knee flexion and any hip abduction/ adduction or hip flexion. Tries to compensate by using UEs a lot and core , however makes stand to sit and sit to stand more difficult with limited ROM in hips and knees. Worked on ambulation with RW in room and toileted. Encouraged pt to push call bell for restroom needs to have assistance come help her to the [facility] so she can continue to work on mobility throughout the day with staff. PT still requires assistance and help and recommend post acute rehab for further strengthening and mobility advancements.    Recommendations for follow up therapy are one component of a multi-disciplinary discharge planning process, led by the attending physician.  Recommendations may be updated based on patient status, additional functional criteria and insurance authorization.  Follow Up Recommendations  Skilled nursing-short term rehab (<3 hours/day)     Assistance Recommended at Discharge Frequent or constant Supervision/Assistance  Equipment Recommendations  None recommended by PT    Recommendations for Other Services       Precautions / Restrictions Precautions Precautions: Fall     Mobility  Bed Mobility Overal bed mobility: Needs Assistance Bed Mobility: Supine to Sit;Sit to Supine     Supine to sit: Mod assist Sit to supine: Min assist   General bed mobility comments: pt is very stiff iwht BLEs, very limited knee flexion or hip adduction or  Adduction. used a lot of UE strength and core to comign to sitting edge of bed.    Transfers Overall transfer level: Needs assistance Equipment used: Rolling walker (2 wheels) Transfers: Sit to/from Stand Sit to Stand: Mod assist           General transfer comment: Mod A from low surfaces, Very difficult stand ot sit and sit ot stand from low surfaces due to limited ability with B knee flexion and hip flexion. Very rigid, or resistant (? )    Ambulation/Gait Ambulation/Gait assistance: Min guard Gait Distance (Feet): 25 Feet (in room to bathroom and around the bed and then back to bed.) Assistive device: Rolling walker (2 wheels) Gait Pattern/deviations: Step-through pattern Gait velocity: decr   General Gait Details: does fairly well with ambulation in flat controlled area, small steps very narrow base of support. , relies on RW , but states she used a rollator ( 4 wjeeled walker) for a while now.   Stairs             Wheelchair Mobility    Modified Rankin (Stroke Patients Only)       Balance   Sitting-balance support: Feet supported;Bilateral upper extremity supported;Feet unsupported (at times had to cue and phuscially help patient to place feet on floor due to tight knee flexion with 90 degree flexion to sit upright with feel flat on floor.) Sitting balance-Leahy Scale: Poor Sitting balance - Comments: difficulty leaning forward when sitting. Postural control: Posterior lean Standing balance support: During functional activity;Bilateral upper extremity supported Standing balance-Leahy Scale: Fair Standing balance  comment: with RW                            Cognition Arousal/Alertness: Awake/alert Behavior During Therapy: WFL for tasks assessed/performed Overall Cognitive Status: Impaired/Different from baseline Area of Impairment: Orientation;Attention;Problem solving                 Orientation Level: Person;Place   Memory: Decreased  short-term memory;Decreased recall of precautions Following Commands: Follows one step commands consistently                Exercises General Exercises - Lower Extremity Ankle Circles/Pumps: AROM;Both;10 reps;Supine Quad Sets: AROM;Both;10 reps;Supine Heel Slides: AAROM;Both;10 reps;Supine (very limited ROM) Hip ABduction/ADduction: AAROM;Supine;Both;10 reps (very limited ROM)    General Comments        Pertinent Vitals/Pain Faces Pain Scale: Hurts a little bit Pain Location: Her R hip and knees, but mostly complaiing of itching all over. And she was scratching a lot. Not bites, or bumps, just dry skin. Applied cream ( eucerin ) in room to help Pain Intervention(s): Monitored during session    Home Living                          Prior Function            PT Goals (current goals can now be found in the care plan section) Acute Rehab PT Goals Patient Stated Goal: to get up PT Goal Formulation: With patient Time For Goal Achievement: 05/24/21 Potential to Achieve Goals: Good Progress towards PT goals: Progressing toward goals    Frequency    Min 2X/week      PT Plan Current plan remains appropriate    Co-evaluation              AM-PAC PT "6 Clicks" Mobility   Outcome Measure  Help needed turning from your back to your side while in a flat bed without using bedrails?: A Lot Help needed moving from lying on your back to sitting on the side of a flat bed without using bedrails?: A Lot Help needed moving to and from a bed to a chair (including a wheelchair)?: A Lot Help needed standing up from a chair using your arms (e.g., wheelchair or bedside chair)?: A Lot Help needed to walk in hospital room?: A Lot Help needed climbing 3-5 steps with a railing? : Total 6 Click Score: 11    End of Session Equipment Utilized During Treatment: Gait belt Activity Tolerance: Patient tolerated treatment well Patient left: in bed;with bed alarm set   PT  Visit Diagnosis: Other abnormalities of gait and mobility (R26.89);Muscle weakness (generalized) (M62.81);Difficulty in walking, not elsewhere classified (R26.2)     Time: 3267-1245 PT Time Calculation (min) (ACUTE ONLY): 28 min  Charges:  $Gait Training: 8-22 mins $Therapeutic Activity: 8-22 mins                     Helaman Mecca, PT, MPT Acute Rehabilitation Services Office: [phone] Pager: [phone] 05/15/2021    Clide Dales 05/15/2021, 5:35 PM

## 2021-05-15 NOTE — Progress Notes (Addendum)
PROGRESS NOTE    Penny [name]  QJF:[id number] DOB: 27-Sep-1934 DOA: 05/08/2021 PCP: Kelton Pillar, MD   Brief Narrative: Penny [name] is a 86 y.o. female with a history of chronic right hip wound, diabetes mellitus type 2, anemia of chronic disease. Patient presented secondary to worsening right hip wound with concern for abscess and cellulitis. IV antibiotics started. Patient underwent I&D on 10/27. Cultures significant for MSSA and enterococcus faecalis.   Assessment & Plan:   Principal Problem:   Complicated open wound of right hip Active Problems:   Type 2 diabetes mellitus with vascular disease (HCC)   Acute prerenal azotemia   Acute on chronic anemia   Dementia (HCC)   Pressure injury of right hip, stage 4 (HCC)   Protein-calorie malnutrition, severe   Right hip wound/cellulitis Right hip/lateral gluteal abscess Chronic wound with evidence of abscess on CT imaging. Started empirically on Vancomycin and Cefepime IV. No blood cultures obtained. Patient underwent I&D with wound vac placement by orthopedic surgery on 10/27; exchanged on 10/29. Wound cultures obtained and are significant for MSSA, enterococcus faecalis, moderate diphtheroids, bacteroides fragalis, -Discontinue Vancomycin/Cefepime and transition to Augmentin; plan for total 10 day antibiotic course  Dehydration Treated with IV fluids. Improved.  Diabetes mellitus, type 2 Patient is on metformin as an outpatient. Hemoglobin A1C of 5.6%.  Iron deficiency anemia Iron panel suggests iron deficiency component. Ferric gluconate given. Hemoglobin stable.  Primary hypertension -Continue diltiazem and hydralazine  Cognitive impairment Would benefit from outpatient neurology/neuropsychology as an outpatient.  Pressure injury Stage 4, right hip. POA  Hypotension Post-op. BP down to 57/32 necessitating initiation of Levophed. Levophed weaned off after 30 minutes. Resolved.  Abdominal distension Mild  abdominal pain. Possible an aspect of constipation. Confirmed large stool burden on x-ray -Bowel regimen -Dulcolax suppository x1 today  AKI on CKD stage IIIa Mild. AKI resolved.  Thrombocytopenia Mild. Likely reactive. Patient is not on heparin nor Lovenox. Transient and now resolved.   DVT prophylaxis: SCDs Code Status:   Code Status: DNR Family Communication: None at bedside Disposition Plan: Discharge to SNF pending transition to oral antibiotics   Consultants:  Orthopedic surgery  Procedures:  None  Antimicrobials: Vancomycin Cefepime    Subjective: No issues. Skin is feeling less dry/itchy.  Objective: Vitals:   05/14/21 2247 05/15/21 0455 05/15/21 0500 05/15/21 0945  BP: 136/64 118/63  134/76  Pulse: 60 (!) 50  61  Resp: 16 16  16   Temp: 98.2 F (36.8 C) 98.6 F (37 C)  98.6 F (37 C)  TempSrc: Oral Oral  Oral  SpO2: 98% 95%  100%  Weight:   68 kg   Height:        Intake/Output Summary (Last 24 hours) at 05/15/2021 1234 Last data filed at 05/15/2021 1100 Gross per 24 hour  Intake 3782.83 ml  Output 3450 ml  Net 332.83 ml    Filed Weights   05/11/21 0451 05/13/21 0442 05/15/21 0500  Weight: 64.2 kg 68.9 kg 68 kg    Examination:  General exam: Appears calm and comfortable Respiratory system: Clear to auscultation. Respiratory effort normal. Cardiovascular system: S1 & S2 heard, RRR. No murmurs, rubs, gallops or clicks. Gastrointestinal system: Abdomen is distended, soft and nontender. No organomegaly or masses felt. Normal bowel sounds heard. Central nervous system: Alert and oriented. No focal neurological deficits. Musculoskeletal: No edema. No calf tenderness Skin: No cyanosis. No rashes Psychiatry: Judgement and insight appear normal. Mood & affect appropriate.    Data Reviewed:  I have personally reviewed following labs and imaging studies  CBC Lab Results  Component Value Date   WBC 4.4 05/15/2021   RBC 2.61 (L) 05/15/2021   HGB  8.3 (L) 05/15/2021   HCT 26.2 (L) 05/15/2021   MCV 100.4 (H) 05/15/2021   MCH 31.8 05/15/2021   PLT 224 05/15/2021   MCHC 31.7 05/15/2021   RDW 13.5 05/15/2021   LYMPHSABS 0.6 (L) 05/10/2021   MONOABS 0.6 05/10/2021   EOSABS 0.3 05/10/2021   BASOSABS 0.0 87/86/7672     Last metabolic panel Lab Results  Component Value Date   NA 135 05/13/2021   K 4.4 05/13/2021   CL 104 05/13/2021   CO2 29 05/13/2021   BUN 59 (H) 05/13/2021   CREATININE 1.12 (H) 05/15/2021   GLUCOSE 122 (H) 05/13/2021   GFRNONAA 48 (L) 05/15/2021   CALCIUM 8.5 (L) 05/13/2021   PHOS 3.5 04/18/2021   PROT 6.2 (L) 05/09/2021   ALBUMIN 2.1 (L) 05/09/2021   BILITOT 0.4 05/09/2021   ALKPHOS 38 05/09/2021   AST 14 (L) 05/09/2021   ALT 15 05/09/2021   ANIONGAP <3 (L) 05/13/2021    CBG (last 3)  Recent Labs    05/14/21 2249 05/15/21 0741 05/15/21 1209  GLUCAP 183* 120* 166*      GFR: Estimated Creatinine Clearance: 33.6 mL/min (A) (by C-G formula based on SCr of 1.12 mg/dL (H)).  Coagulation Profile: Recent Labs  Lab 05/09/21 0544  INR 1.2     Recent Results (from the past 240 hour(s))  Resp Panel by RT-PCR (Flu A&B, Covid) Nasopharyngeal Swab     Status: None   Collection Time: 05/08/21  8:50 PM   Specimen: Nasopharyngeal Swab; Nasopharyngeal(NP) swabs in vial transport medium  Result Value Ref Range Status   SARS Coronavirus 2 by RT PCR NEGATIVE NEGATIVE Final    Comment: (NOTE) SARS-CoV-2 target nucleic acids are NOT DETECTED.  The SARS-CoV-2 RNA is generally detectable in upper respiratory specimens during the acute phase of infection. The lowest concentration of SARS-CoV-2 viral copies this assay can detect is 138 copies/mL. A negative result does not preclude SARS-Cov-2 infection and should not be used as the sole basis for treatment or other patient management decisions. A negative result may occur with  improper specimen collection/handling, submission of specimen other than  nasopharyngeal swab, presence of viral mutation(s) within the areas targeted by this assay, and inadequate number of viral copies(<138 copies/mL). A negative result must be combined with clinical observations, patient history, and epidemiological information. The expected result is Negative.  Fact Sheet for Patients:  EntrepreneurPulse.com.au  Fact Sheet for Healthcare Providers:  IncredibleEmployment.be  This test is no t yet approved or cleared by the [place] FDA and  has been authorized for detection and/or diagnosis of SARS-CoV-2 by FDA under an Emergency Use Authorization (EUA). This EUA will remain  in effect (meaning this test can be used) for the duration of the COVID-19 declaration under Section 564(b)(1) of the Act, 21 U.S.C.section 360bbb-3(b)(1), unless the authorization is terminated  or revoked sooner.       Influenza A by PCR NEGATIVE NEGATIVE Final   Influenza B by PCR NEGATIVE NEGATIVE Final    Comment: (NOTE) The Xpert Xpress SARS-CoV-2/FLU/RSV plus assay is intended as an aid in the diagnosis of influenza from Nasopharyngeal swab specimens and should not be used as a sole basis for treatment. Nasal washings and aspirates are unacceptable for Xpert Xpress SARS-CoV-2/FLU/RSV testing.  Fact Sheet for Patients: EntrepreneurPulse.com.au  Fact Sheet for Healthcare Providers: IncredibleEmployment.be  This test is not yet approved or cleared by the [place] FDA and has been authorized for detection and/or diagnosis of SARS-CoV-2 by FDA under an Emergency Use Authorization (EUA). This EUA will remain in effect (meaning this test can be used) for the duration of the COVID-19 declaration under Section 564(b)(1) of the Act, 21 U.S.C. section 360bbb-3(b)(1), unless the authorization is terminated or revoked.  Performed at [facility], [place] [address] [place], [place] [address]   MRSA Next Gen by PCR, Nasal     Status: Abnormal   Collection Time: 05/11/21  4:00 AM   Specimen: Nasal Mucosa; Nasal Swab  Result Value Ref Range Status   MRSA by PCR Next Gen DETECTED (A) NOT DETECTED Final    Comment: RESULT CALLED TO, READ BACK BY AND VERIFIED WITH: YUNT,H. RN @1135  ON 10.27.2022 BY NMCCOY (NOTE) The GeneXpert MRSA Assay (FDA approved for NASAL specimens only), is one component of a comprehensive MRSA colonization surveillance program. It is not intended to diagnose MRSA infection nor to guide or monitor treatment for MRSA infections. Test performance is not FDA approved in patients less than 34 years old. Performed at [facility], [place] [address] [place], [place] [address]   Aerobic/Anaerobic Culture w Gram Stain (surgical/deep wound)     Status: None   Collection Time: 05/11/21  8:03 AM   Specimen: PATH Other; Tissue  Result Value Ref Range Status   Specimen Description   Final    TISSUE RIGHT HIP Performed at [place] [address], [place], [place] [address]    Special Requests   Final    NONE Performed at [facility], [place] [address] [place], [place] [address]    Gram Stain   Final    MODERATE WBC PRESENT, PREDOMINANTLY PMN MODERATE GRAM NEGATIVE RODS FEW GRAM POSITIVE COCCI    Culture   Final    RARE STAPHYLOCOCCUS AUREUS RARE ENTEROCOCCUS FAECALIS MODERATE DIPHTHEROIDS(CORYNEBACTERIUM SPECIES) Standardized susceptibility testing for this organism is not available. MODERATE BACTEROIDES FRAGILIS BETA LACTAMASE POSITIVE Performed at [facility], [place] [address], [place], [place] [address]    Report Status 05/14/2021 FINAL  Final   Organism ID, Bacteria STAPHYLOCOCCUS AUREUS  Final   Organism ID, Bacteria ENTEROCOCCUS FAECALIS  Final      Susceptibility   Enterococcus faecalis - MIC*    AMPICILLIN <=2 SENSITIVE Sensitive     VANCOMYCIN 2  SENSITIVE Sensitive     GENTAMICIN SYNERGY SENSITIVE Sensitive     * RARE ENTEROCOCCUS FAECALIS   Staphylococcus aureus - MIC*    CIPROFLOXACIN <=0.5 SENSITIVE Sensitive     ERYTHROMYCIN <=0.25 SENSITIVE Sensitive     GENTAMICIN <=0.5 SENSITIVE Sensitive     OXACILLIN <=0.25 SENSITIVE Sensitive     TETRACYCLINE <=1 SENSITIVE Sensitive     VANCOMYCIN 1 SENSITIVE Sensitive     TRIMETH/SULFA <=10 SENSITIVE Sensitive     CLINDAMYCIN <=0.25 SENSITIVE Sensitive     RIFAMPIN <=0.5 SENSITIVE Sensitive     Inducible Clindamycin NEGATIVE Sensitive     * RARE STAPHYLOCOCCUS AUREUS         Radiology Studies: No results found.      Scheduled Meds:  amoxicillin  500 mg Oral Q8H   Chlorhexidine Gluconate Cloth  6 each Topical Q0600   diltiazem  240 mg Oral Daily   docusate sodium  100 mg Oral BID   feeding supplement  237 mL Oral BID BM   hydrALAZINE  25 mg Oral Q8H   hydrocerin   Topical BID   insulin aspart  0-9 Units Subcutaneous TID WC   multivitamin with minerals  1 tablet Oral Daily   mupirocin ointment  1 application Nasal BID   nutrition supplement (JUVEN)  1 packet Oral BID BM   polyethylene glycol  17 g Oral Daily   Continuous Infusions:  lactated ringers 50 mL/hr at 05/15/21 1212   methocarbamol (ROBAXIN) IV       LOS: 7 days     Cordelia Poche, MD [facility] 05/15/2021, 12:34 PM  If 7PM-7AM, please contact night-coverage [email or web address]

## 2021-05-16 DIAGNOSIS — D649 Anemia, unspecified: Secondary | ICD-10-CM | POA: Diagnosis not present

## 2021-05-16 DIAGNOSIS — L089 Local infection of the skin and subcutaneous tissue, unspecified: Secondary | ICD-10-CM | POA: Diagnosis not present

## 2021-05-16 DIAGNOSIS — E1159 Type 2 diabetes mellitus with other circulatory complications: Secondary | ICD-10-CM | POA: Diagnosis not present

## 2021-05-16 DIAGNOSIS — L0390 Cellulitis, unspecified: Secondary | ICD-10-CM | POA: Diagnosis not present

## 2021-05-16 DIAGNOSIS — L89323 Pressure ulcer of left buttock, stage 3: Secondary | ICD-10-CM | POA: Diagnosis not present

## 2021-05-16 DIAGNOSIS — S71001D Unspecified open wound, right hip, subsequent encounter: Secondary | ICD-10-CM | POA: Diagnosis not present

## 2021-05-16 DIAGNOSIS — M19011 Primary osteoarthritis, right shoulder: Secondary | ICD-10-CM | POA: Diagnosis not present

## 2021-05-16 DIAGNOSIS — E1140 Type 2 diabetes mellitus with diabetic neuropathy, unspecified: Secondary | ICD-10-CM | POA: Diagnosis not present

## 2021-05-16 DIAGNOSIS — B9561 Methicillin susceptible Staphylococcus aureus infection as the cause of diseases classified elsewhere: Secondary | ICD-10-CM | POA: Diagnosis not present

## 2021-05-16 DIAGNOSIS — M6281 Muscle weakness (generalized): Secondary | ICD-10-CM | POA: Diagnosis not present

## 2021-05-16 DIAGNOSIS — S43021S Posterior subluxation of right humerus, sequela: Secondary | ICD-10-CM | POA: Diagnosis not present

## 2021-05-16 DIAGNOSIS — E1022 Type 1 diabetes mellitus with diabetic chronic kidney disease: Secondary | ICD-10-CM | POA: Diagnosis not present

## 2021-05-16 DIAGNOSIS — I10 Essential (primary) hypertension: Secondary | ICD-10-CM | POA: Diagnosis not present

## 2021-05-16 DIAGNOSIS — N179 Acute kidney failure, unspecified: Secondary | ICD-10-CM | POA: Diagnosis not present

## 2021-05-16 DIAGNOSIS — L02415 Cutaneous abscess of right lower limb: Secondary | ICD-10-CM | POA: Diagnosis not present

## 2021-05-16 DIAGNOSIS — M170 Bilateral primary osteoarthritis of knee: Secondary | ICD-10-CM | POA: Diagnosis not present

## 2021-05-16 DIAGNOSIS — M009 Pyogenic arthritis, unspecified: Secondary | ICD-10-CM | POA: Diagnosis not present

## 2021-05-16 DIAGNOSIS — L03115 Cellulitis of right lower limb: Secondary | ICD-10-CM | POA: Diagnosis not present

## 2021-05-16 DIAGNOSIS — S71001S Unspecified open wound, right hip, sequela: Secondary | ICD-10-CM | POA: Diagnosis not present

## 2021-05-16 DIAGNOSIS — B966 Bacteroides fragilis [B. fragilis] as the cause of diseases classified elsewhere: Secondary | ICD-10-CM | POA: Diagnosis not present

## 2021-05-16 DIAGNOSIS — D509 Iron deficiency anemia, unspecified: Secondary | ICD-10-CM | POA: Diagnosis not present

## 2021-05-16 DIAGNOSIS — D696 Thrombocytopenia, unspecified: Secondary | ICD-10-CM | POA: Diagnosis not present

## 2021-05-16 DIAGNOSIS — M25551 Pain in right hip: Secondary | ICD-10-CM | POA: Diagnosis not present

## 2021-05-16 DIAGNOSIS — E43 Unspecified severe protein-calorie malnutrition: Secondary | ICD-10-CM | POA: Diagnosis not present

## 2021-05-16 DIAGNOSIS — B952 Enterococcus as the cause of diseases classified elsewhere: Secondary | ICD-10-CM | POA: Diagnosis not present

## 2021-05-16 DIAGNOSIS — D631 Anemia in chronic kidney disease: Secondary | ICD-10-CM | POA: Diagnosis not present

## 2021-05-16 DIAGNOSIS — L309 Dermatitis, unspecified: Secondary | ICD-10-CM | POA: Diagnosis not present

## 2021-05-16 DIAGNOSIS — Z8673 Personal history of transient ischemic attack (TIA), and cerebral infarction without residual deficits: Secondary | ICD-10-CM | POA: Diagnosis not present

## 2021-05-16 DIAGNOSIS — M5134 Other intervertebral disc degeneration, thoracic region: Secondary | ICD-10-CM | POA: Diagnosis not present

## 2021-05-16 DIAGNOSIS — L89894 Pressure ulcer of other site, stage 4: Secondary | ICD-10-CM | POA: Diagnosis not present

## 2021-05-16 DIAGNOSIS — Z1159 Encounter for screening for other viral diseases: Secondary | ICD-10-CM | POA: Diagnosis not present

## 2021-05-16 DIAGNOSIS — E1122 Type 2 diabetes mellitus with diabetic chronic kidney disease: Secondary | ICD-10-CM | POA: Diagnosis not present

## 2021-05-16 DIAGNOSIS — L89214 Pressure ulcer of right hip, stage 4: Secondary | ICD-10-CM | POA: Diagnosis not present

## 2021-05-16 DIAGNOSIS — R404 Transient alteration of awareness: Secondary | ICD-10-CM | POA: Diagnosis not present

## 2021-05-16 DIAGNOSIS — Z66 Do not resuscitate: Secondary | ICD-10-CM | POA: Diagnosis not present

## 2021-05-16 DIAGNOSIS — N1831 Chronic kidney disease, stage 3a: Secondary | ICD-10-CM | POA: Diagnosis not present

## 2021-05-16 DIAGNOSIS — D539 Nutritional anemia, unspecified: Secondary | ICD-10-CM | POA: Diagnosis not present

## 2021-05-16 DIAGNOSIS — Z7401 Bed confinement status: Secondary | ICD-10-CM | POA: Diagnosis not present

## 2021-05-16 DIAGNOSIS — E1169 Type 2 diabetes mellitus with other specified complication: Secondary | ICD-10-CM | POA: Diagnosis not present

## 2021-05-16 DIAGNOSIS — Z20822 Contact with and (suspected) exposure to covid-19: Secondary | ICD-10-CM | POA: Diagnosis not present

## 2021-05-16 DIAGNOSIS — L89890 Pressure ulcer of other site, unstageable: Secondary | ICD-10-CM | POA: Diagnosis not present

## 2021-05-16 DIAGNOSIS — D508 Other iron deficiency anemias: Secondary | ICD-10-CM | POA: Diagnosis not present

## 2021-05-16 DIAGNOSIS — L299 Pruritus, unspecified: Secondary | ICD-10-CM | POA: Diagnosis not present

## 2021-05-16 DIAGNOSIS — N19 Unspecified kidney failure: Secondary | ICD-10-CM | POA: Diagnosis not present

## 2021-05-16 DIAGNOSIS — L89152 Pressure ulcer of sacral region, stage 2: Secondary | ICD-10-CM | POA: Diagnosis not present

## 2021-05-16 DIAGNOSIS — R410 Disorientation, unspecified: Secondary | ICD-10-CM | POA: Diagnosis not present

## 2021-05-16 DIAGNOSIS — E860 Dehydration: Secondary | ICD-10-CM | POA: Diagnosis not present

## 2021-05-16 DIAGNOSIS — M160 Bilateral primary osteoarthritis of hip: Secondary | ICD-10-CM | POA: Diagnosis not present

## 2021-05-16 DIAGNOSIS — M79604 Pain in right leg: Secondary | ICD-10-CM | POA: Diagnosis not present

## 2021-05-16 DIAGNOSIS — L98411 Non-pressure chronic ulcer of buttock limited to breakdown of skin: Secondary | ICD-10-CM | POA: Diagnosis not present

## 2021-05-16 LAB — GLUCOSE, CAPILLARY
Glucose-Capillary: 160 mg/dL — ABNORMAL HIGH (ref 70–99)
Glucose-Capillary: 136 mg/dL — ABNORMAL HIGH (ref 70–99)

## 2021-05-16 LAB — CREATININE, SERUM
Creatinine, Ser: 1.09 mg/dL — ABNORMAL HIGH (ref 0.44–1.00)
GFR, Estimated: 50 mL/min — ABNORMAL LOW (ref >60)

## 2021-05-16 LAB — SARS CORONAVIRUS 2 (TAT 6-24 HRS): SARS Coronavirus 2: NEGATIVE

## 2021-05-16 MED ORDER — AMOXICILLIN-POT CLAVULANATE 400-57 MG/5ML PO SUSR: 875.0000 mg | Freq: Two times a day (BID) | ORAL | Status: AC

## 2021-05-16 MED ORDER — DILTIAZEM HCL ER COATED BEADS 180 MG PO CP24
180.0000 mg | ORAL_CAPSULE | Freq: Every day | ORAL | Status: DC
  Administered 2021-05-16: 180 mg via ORAL
  Filled 2021-05-16: qty 1

## 2021-05-16 MED ORDER — DILTIAZEM HCL ER COATED BEADS 180 MG PO CP24: 180.0000 mg | ORAL_CAPSULE | Freq: Every day | ORAL | Status: DC

## 2021-05-16 MED ORDER — BISACODYL 10 MG RE SUPP
10.0000 mg | Freq: Once | RECTAL | Status: AC
  Administered 2021-05-16: 10 mg via RECTAL
  Filled 2021-05-16: qty 1

## 2021-05-16 MED ORDER — POLYETHYLENE GLYCOL 3350 17 G PO PACK: 17.0000 g | PACK | Freq: Every day | ORAL | Status: DC

## 2021-05-16 NOTE — Progress Notes (Addendum)
PATIENT ID: Penny [name]  MRN: [id number]  DOB/AGE:  11-08-1934 / 86 y.o.  5 Days Post-Op Procedure(s) (LRB): IRRIGATION AND DEBRIDEMENT HIP PLACEMENT OF WOUND VAC (Right)  Subjective: Pain is minimal.  No c/o chest pain or SOB. Sitting comfortably in room, watching TV during rounds.   Objective: Vital signs in last 24 hours: Temp:  [97.9 F (36.6 C)-98.8 F (37.1 C)] 98.4 F (36.9 C) (11/01 0955) Pulse Rate:  [49-59] 53 (11/01 0955) Resp:  [15-19] 15 (11/01 0955) BP: (109-144)/(56-68) 124/67 (11/01 0955) SpO2:  [97 %-100 %] 100 % (11/01 0955) Weight:  [73.9 kg] 73.9 kg (11/01 0500)  Intake/Output from previous day: 10/31 0701 - 11/01 0700 In: 2012.2 [P.O.:1290; I.V.:722.2] Out: 2900 [Urine:2850; Drains:50] Intake/Output this shift: Total I/O In: 620 [P.O.:620] Out: 950 [Urine:950]  Recent Labs    05/14/21 0304 05/15/21 0341  HGB 8.2* 8.3*   Recent Labs    05/14/21 0304 05/15/21 0341  WBC 4.9 4.4  RBC 2.59* 2.61*  HCT 27.0* 26.2*  PLT 126* 224   Recent Labs    05/15/21 0341 05/16/21 0320  CREATININE 1.12* 1.09*    Physical Exam: Neurologically intact Sensation intact distally Intact pulses distally Dorsiflexion/Plantar flexion intact Compartment soft Wound vac in place over right glute/hip  Assessment/Plan: 5 Days Post-Op Procedure(s) (LRB): IRRIGATION AND DEBRIDEMENT HIP PLACEMENT OF WOUND VAC (Right)   Advance diet Up with therapy Discharge to SNF Weight Bearing as Tolerated (WBAT)  Per medicine note, patient likely to be discharged to [facility] healthcare today. Patient to continue MWF NPWT with wound vac with wound care team at [facility]. Will discontinue at their discretion but can follow up with Dr Tamera Punt if needed to help determine readiness for transition off wound vac.    Reniah Cottingham L. [place], PA-C 05/16/2021, 12:01 PM

## 2021-05-16 NOTE — Plan of Care (Signed)

## 2021-05-16 NOTE — Discharge Summary (Signed)
Physician Discharge Summary  Penny [name] XFG:[id number] DOB: 1935/04/27 DOA: 05/08/2021  PCP: Kelton Pillar, MD  Admit date: 05/08/2021 Discharge date: 05/16/2021  Admitted From: Home Disposition: SNF  Recommendations for Outpatient Follow-up:  Follow up with PCP in 1 week Wound vac per wound care Please follow up on the following pending results: None  Equipment/Devices: Wound vac  Discharge Condition: Stable CODE STATUS: DNR Diet recommendation: Carb modified  Brief/Interim Summary:  Admission HPI written by Rhetta Mura, DO   HPI: Penny [name] is a 86 y.o. female with medical history significant for chronic right hip wound, type 2 diabetes mellitus, anemia of chronic disease with baseline hemoglobin 10-12,  who is admitted to [facility] on 05/08/2021 with infected stage IV pressure wound associated with the right hip after presenting to [facility] ED for evaluation purulent drainage from chronic right hip wound.    The patient was undergoing outpatient wound care related to her chronic right hip wound and wound care personnel noted the interval development of purulent drainage from this wound as well as associated new onset surrounding erythema.  The patient reports potential very mild pain associated with this lesion.  Denies any associated subjective fever, chills, rigors, or generalized myalgias.  Denies any new erythema at any other location.Denies any recent headache, neck stiffness, rhinitis, rhinorrhea, sore throat, sob, wheezing, cough, nausea, vomiting, abdominal pain, diarrhea. Denies dysuria, gross hematuria, or change in urinary urgency/frequency.  Denies any recent chest pain, diaphoresis, or palpitations.   Medical history notable for type 2 diabetes mellitus with most recent hemoglobin A1c found to be 5.6% on 04/18/2021.  Non-insulin-dependent, on metformin as result patient oral hypoglycemic agent.   Hospital course:  Right hip  wound/cellulitis Right hip/lateral gluteal abscess Chronic wound with evidence of abscess on CT imaging. Started empirically on Vancomycin and Cefepime IV. No blood cultures obtained. Patient underwent I&D with wound vac placement by orthopedic surgery on 10/27; exchanged on 10/29. Wound cultures obtained and are significant for MSSA, enterococcus faecalis, moderate diphtheroids, bacteroides fragalis. Patient transitioned to Augmentin to continue for a total of 10 days of antibiotics.   Dehydration Treated with IV fluids. Improved.   Diabetes mellitus, type 2 Patient is on metformin as an outpatient. Hemoglobin A1C of 5.6%.   Iron deficiency anemia Iron panel suggests iron deficiency component. Ferric gluconate given. Hemoglobin stable.   Primary hypertension Continue diltiazem (decreased to 180 mg daily) and hydralazine   Cognitive impairment Would benefit from outpatient neurology/neuropsychology as an outpatient.   Pressure injury Stage 4, right hip. POA   Hypotension Post-op. BP down to 57/32 necessitating initiation of Levophed. Levophed weaned off after 30 minutes. Resolved.   Abdominal distension Mild abdominal pain. Possible an aspect of constipation. Confirmed large stool burden on x-ray. Recommend continuing bowel regimen.   AKI on CKD stage IIIa Mild. AKI resolved.  Sinus bradycardia Likely related to Cardizem. No heart block. Decreased to diltiazem 180 mg daily   Thrombocytopenia Mild. Likely reactive. Patient is not on heparin nor Lovenox. Transient and now resolved.   Discharge Diagnoses:  Principal Problem:   Complicated open wound of right hip Active Problems:   Type 2 diabetes mellitus with vascular disease (HCC)   Acute prerenal azotemia   Acute on chronic anemia   Dementia (HCC)   Pressure injury of right hip, stage 4 (HCC)   Protein-calorie malnutrition, severe    Discharge Instructions  Discharge Instructions     Ambulatory referral to  Neurology  Complete by: As directed    An appointment is requested in approximately: 2 weeks For dementia evaluation.      Allergies as of 05/16/2021       Reactions   Glipizide Rash        Medication List     STOP taking these medications    hydrochlorothiazide 25 MG tablet Commonly known as: HYDRODIURIL       TAKE these medications    acetaminophen 650 MG suppository Commonly known as: TYLENOL Place 650 mg rectally every 6 (six) hours as needed for mild pain.   acetaminophen 325 MG tablet Commonly known as: TYLENOL Take 650 mg by mouth every 6 (six) hours as needed for mild pain.   amoxicillin-clavulanate 400-57 MG/5ML suspension Commonly known as: AUGMENTIN Take 10.9 mLs (875 mg total) by mouth every 12 (twelve) hours for 2 days.   aspirin 81 MG tablet Take 81 mg by mouth daily.   diltiazem 180 MG 24 hr capsule Commonly known as: CARDIZEM CD Take 1 capsule (180 mg total) by mouth daily. Start taking on: May 17, 2021 What changed:  medication strength how much to take   feeding supplement (GLUCERNA SHAKE) Liqd Take 237 mLs by mouth 2 (two) times daily between meals. What changed: Another medication with the same name was removed. Continue taking this medication, and follow the directions you see here.   feeding supplement (PRO-STAT SUGAR FREE 64) Liqd Take 30 mLs by mouth 2 (two) times daily.   hydrALAZINE 25 MG tablet Commonly known as: APRESOLINE Take 1 tablet (25 mg total) by mouth every 8 (eight) hours.   insulin lispro 100 UNIT/ML KwikPen Commonly known as: HUMALOG Inject 2-10 Units into the skin 4 (four) times daily -  before meals and at bedtime. Sliding scale:  if CBG is 0 - 150 = 0 units, 151-199 = 2 units, 200-249 = 4 units, 250-299 = 6 units, 300-349 = 8 units, 350-399 = 10 units, 400 - Call MD   melatonin 3 MG Tabs tablet Take 3 mg by mouth at bedtime.   metFORMIN 500 MG tablet Commonly known as: GLUCOPHAGE Take 500 mg by mouth  2 (two) times daily with a meal.   Multivitamin Adults Tabs Take 1 tablet by mouth daily.   ondansetron 4 MG tablet Commonly known as: ZOFRAN Take 4 mg by mouth every 6 (six) hours as needed for nausea or vomiting.   polyethylene glycol 17 g packet Commonly known as: MIRALAX / GLYCOLAX Take 17 g by mouth daily. Start taking on: May 17, 2021   senna 8.6 MG Tabs tablet Commonly known as: SENOKOT Take 1 tablet (8.6 mg total) by mouth 2 (two) times daily.               Discharge Care Instructions  (From admission, onward)           Start     Ordered   05/16/21 0000  Discharge wound care:       Comments: Per wound care service.   05/16/21 1135            Follow-up Information     Kelton Pillar, MD. Schedule an appointment as soon as possible for a visit in 1 week(s).   Specialty: Family Medicine Why: For hospital follow-up Contact information: [address] [place] [address] [phone]                Allergies  Allergen Reactions   Glipizide Rash  Consultations: Orthopedic surgery   Procedures/Studies: DG Ribs Unilateral W/Chest Right  Result Date: 04/17/2021 CLINICAL DATA:  Unwitnessed fall. EXAM: RIGHT RIBS AND CHEST - 3+ VIEW COMPARISON:  None. FINDINGS: No fracture or other bone lesions are seen involving the ribs. Degenerative changes are seen throughout the thoracic spine. There is no evidence of pneumothorax or pleural effusion. Both lungs are clear. Heart size and mediastinal contours are within normal limits. IMPRESSION: No acute osseous abnormality. Electronically Signed   By: Virgina Norfolk M.D.   On: 04/17/2021 23:30   DG Shoulder Right  Result Date: 04/17/2021 CLINICAL DATA:  Unwitnessed fall. EXAM: RIGHT SHOULDER - 2+ VIEW COMPARISON:  None. FINDINGS: There is no evidence of an acute fracture or dislocation. Chronic deformities are seen involving the inferolateral aspect of the right humeral head. A  chronic deformity of the greater tubercle is also seen seen. Moderate to marked severity degenerative changes are seen involving the right acromioclavicular joint and right glenohumeral articulation. Soft tissues are unremarkable. IMPRESSION: 1. Degenerative changes without evidence of an acute fracture or dislocation. 2. Chronic deformities of the right humeral head and right greater tubercle. Electronically Signed   By: Virgina Norfolk M.D.   On: 04/17/2021 23:31   CT Head Wo Contrast  Result Date: 04/17/2021 CLINICAL DATA:  Polytrauma. EXAM: CT HEAD WITHOUT CONTRAST CT CERVICAL SPINE WITHOUT CONTRAST TECHNIQUE: Multidetector CT imaging of the head and cervical spine was performed following the standard protocol without intravenous contrast. Multiplanar CT image reconstructions of the cervical spine were also generated. COMPARISON:  None. FINDINGS: CT HEAD FINDINGS Brain: No evidence of acute infarction, hemorrhage, hydrocephalus, extra-axial collection or mass lesion/mass effect. There is mild periventricular white matter hypodensity, likely chronic small vessel ischemic change. There is mild diffuse atrophy. Vascular: Atherosclerotic calcifications are present within the cavernous internal carotid arteries. Skull: Normal. Negative for fracture or focal lesion. Sinuses/Orbits: No acute finding. Other: There is right facial and right frontal soft tissue swelling. There are punctate skin densities, likely calcifications of the face bilaterally. CT CERVICAL SPINE FINDINGS Alignment: Normal. Skull base and vertebrae: No acute fracture. No primary bone lesion or focal pathologic process. Soft tissues and spinal canal: No prevertebral fluid or swelling. No visible canal hematoma. Disc levels: There is disc space narrowing and endplate osteophyte formation at C5-C6 and C7-T1 compatible with degenerative change. There is scattered mild neural foraminal stenosis bilaterally. Disc bulge at C7-T1 causes moderate  central canal stenosis. Upper chest: There is a 3 mm nodule in the left upper lobe image 5/90. Other: There is a heavily calcified left thyroid nodule measuring 9 mm. There is a 5 mm hypodense left thyroid nodule. IMPRESSION: 1.  No acute intracranial process. 2. No acute fracture or traumatic subluxation of the cervical spine. 3. 3 mm nodule in the left upper lobe. If patient is at low risk for bronchus carcinoma no follow-up is necessary. The patient is at high risk a follow-up CT is recommended in 12 months to reassess. Electronically Signed   By: Ronney Asters M.D.   On: 04/17/2021 22:52   CT Cervical Spine Wo Contrast  Result Date: 04/17/2021 CLINICAL DATA:  Polytrauma. EXAM: CT HEAD WITHOUT CONTRAST CT CERVICAL SPINE WITHOUT CONTRAST TECHNIQUE: Multidetector CT imaging of the head and cervical spine was performed following the standard protocol without intravenous contrast. Multiplanar CT image reconstructions of the cervical spine were also generated. COMPARISON:  None. FINDINGS: CT HEAD FINDINGS Brain: No evidence of acute infarction, hemorrhage, hydrocephalus, extra-axial collection or mass  lesion/mass effect. There is mild periventricular white matter hypodensity, likely chronic small vessel ischemic change. There is mild diffuse atrophy. Vascular: Atherosclerotic calcifications are present within the cavernous internal carotid arteries. Skull: Normal. Negative for fracture or focal lesion. Sinuses/Orbits: No acute finding. Other: There is right facial and right frontal soft tissue swelling. There are punctate skin densities, likely calcifications of the face bilaterally. CT CERVICAL SPINE FINDINGS Alignment: Normal. Skull base and vertebrae: No acute fracture. No primary bone lesion or focal pathologic process. Soft tissues and spinal canal: No prevertebral fluid or swelling. No visible canal hematoma. Disc levels: There is disc space narrowing and endplate osteophyte formation at C5-C6 and C7-T1  compatible with degenerative change. There is scattered mild neural foraminal stenosis bilaterally. Disc bulge at C7-T1 causes moderate central canal stenosis. Upper chest: There is a 3 mm nodule in the left upper lobe image 5/90. Other: There is a heavily calcified left thyroid nodule measuring 9 mm. There is a 5 mm hypodense left thyroid nodule. IMPRESSION: 1.  No acute intracranial process. 2. No acute fracture or traumatic subluxation of the cervical spine. 3. 3 mm nodule in the left upper lobe. If patient is at low risk for bronchus carcinoma no follow-up is necessary. The patient is at high risk a follow-up CT is recommended in 12 months to reassess. Electronically Signed   By: Ronney Asters M.D.   On: 04/17/2021 22:52   MR BRAIN WO CONTRAST  Result Date: 04/19/2021 CLINICAL DATA:  Mental status change, recent trauma EXAM: MRI HEAD WITHOUT CONTRAST TECHNIQUE: Multiplanar, multiecho pulse sequences of the brain and surrounding structures were obtained without intravenous contrast. COMPARISON:  CT head 04/17/2021, no prior MRI FINDINGS: Brain: No acute infarction, hemorrhage, hydrocephalus, extra-axial collection, or mass lesion. The ventricles and sulci are normal for age. Confluent T2 hyperintense signal in the periventricular white matter, likely the sequela of chronic small vessel ischemic disease. Punctate focus of susceptibility in the right cerebellar hemisphere, possibly the sequela of a punctate microhemorrhage. Additional susceptibility along the falx, which correlates with calcifications seen on the 04/17/2021 CT. Remote lacunar infarct in right basal ganglia. Vascular: Normal flow voids. Skull and upper cervical spine: Normal marrow signal. Sinuses/Orbits: Negative.  Status post bilateral lens replacements. Other: The mastoids are well aerated. IMPRESSION: No acute osseous abnormality. No etiology is seen for the patient's mental status change. Electronically Signed   By: Merilyn Baba M.D.   On:  04/19/2021 16:04   DG Pelvis Portable  Result Date: 04/17/2021 CLINICAL DATA:  Unwitnessed fall. EXAM: PORTABLE PELVIS 1-2 VIEWS COMPARISON:  None. FINDINGS: There is no evidence of an acute pelvic fracture or diastasis. Marked severity degenerative changes are seen involving both hips, in the form of joint space narrowing and acetabular sclerosis. A large amount of stool is seen throughout the colon. IMPRESSION: 1. No acute fracture or diastasis. 2. Marked severity degenerative changes of both hips. Electronically Signed   By: Virgina Norfolk M.D.   On: 04/17/2021 23:25   CT FEMUR RIGHT WO CONTRAST  Result Date: 05/10/2021 CLINICAL DATA:  Suspected osteomyelitis EXAM: CT OF THE LOWER RIGHT EXTREMITY WITHOUT CONTRAST TECHNIQUE: Multidetector CT imaging of the right lower extremity was performed according to the standard protocol. COMPARISON:  Radiographs dated May 08, 2021 FINDINGS: Bones/Joint/Cartilage No cortical erosion or periosteal reaction concerning for osteomyelitis. Advanced right hip osteoarthritis with bone on bone articulation and prominent osteophytes. Advanced right knee osteoarthritis. No acute fracture or suspicious osseous lesion Ligaments Suboptimally assessed by CT.  Muscles and Tendons No acute abnormality. Soft tissues Deep skin wound in the posterolateral gluteal region with a gas containing soft tissue collection measuring approximately 3.5 x 2.0 x 8.3 cm (AP x TR x CC). Generalized subcutaneous soft tissue edema of the right lower extremity. IMPRESSION: 1. Gas containing soft tissue collection in the posterolateral gluteal region measuring at least 3.5 x 2.0 x 8.3 cm (AP x TR x CC). Surgical consultation for further management could be helpful. 2.  No CT evidence of acute osteomyelitis. 3.  Advanced right hip and right knee osteoarthritis. Electronically Signed   By: Keane Police D.O.   On: 05/10/2021 07:42   MR HIP RIGHT WO CONTRAST  Result Date: 05/08/2021 CLINICAL DATA:   Chronic ulcer to right hip EXAM: MR OF THE RIGHT HIP WITHOUT CONTRAST TECHNIQUE: Multiplanar, multisequence MR imaging was performed. No intravenous contrast was administered. Examination was terminated after only 3 series were obtained secondary to patient pain. COMPARISON:  Radiograph 05/08/2021 FINDINGS: All 3 sequences are significantly degraded by patient motion. Bones: Heterogeneous marrow signal. There may be edema within the sacrococcygeal region on limited axial views. Small focal area of edema within the right superior pubic ramus complete loss of the hip joint space with bone on bone appearance and remodeling of the femoral heads. Probable left supra-acetabular cyst. Articular cartilage and labrum Articular cartilage:  Nondiagnostic Labrum:  Nondiagnostic Joint or bursal effusion Joint effusion:  No gross hip effusions. Bursae: No gross bursal distension Muscles and tendons Muscles and tendons:  Generalized atrophy Other findings Miscellaneous: The patient's right hip soft tissue ulcer is only partially imaged on coronal views. There is a ulcer within the right lateral hip soft tissues. Abundant subcutaneous edema within the partially visualized right hip soft tissues laterally. There is probable edema within the lateral gluteus muscles as well. No gross focal fluid collection. IMPRESSION: 1. Nearly nondiagnostic study as only limited sequences were obtained, all significantly degraded by patient motion. In addition, the patient's right hip ulcer is incompletely included in the field of view. 2. Generalized soft tissue edema within the lateral hip soft tissues with suspected edema in the gluteus muscles. No gross marrow edema within the underlying femur allowing for excessive motion. 3. Nonspecific foci of suspected edema within the sacrococcygeal region and right superior pubic ramus. 4. Severe arthritis of the bilateral hips without gross hip effusion. Electronically Signed   By: Donavan Foil M.D.    On: 05/08/2021 21:40   DG Knee Complete 4 Views Left  Result Date: 04/17/2021 CLINICAL DATA:  Unwitnessed fall. EXAM: LEFT KNEE - COMPLETE 4+ VIEW COMPARISON:  None. FINDINGS: No evidence of an acute fracture or dislocation. Marked severity narrowing of the medial and lateral tibiofemoral compartment spaces is seen. Marked severity patellofemoral narrowing is also noted. Marked severity vascular calcification is seen. There is a small joint effusion. IMPRESSION: 1. No evidence of acute fracture or dislocation. 2. Severe tricompartmental degenerative changes. 3. Small joint effusion. Electronically Signed   By: Virgina Norfolk M.D.   On: 04/17/2021 23:28   DG Knee Complete 4 Views Right  Result Date: 04/17/2021 CLINICAL DATA:  Unwitnessed fall. EXAM: RIGHT KNEE - COMPLETE 4+ VIEW COMPARISON:  None. FINDINGS: No evidence of an acute fracture. Mild chronic appearing lateral subluxation of the right tibia is noted with respect to the distal right femur. Marked severity medial and lateral tibiofemoral compartment space narrowing is seen. Marked severity patellofemoral narrowing is also noted. There is moderate to marked severity vascular calcification.  A very small joint effusion is suspected. IMPRESSION: 1. No evidence of acute osseous abnormality. 2. Severe tricompartmental degenerative changes. 3. Very small joint effusion. Electronically Signed   By: Virgina Norfolk M.D.   On: 04/17/2021 23:29   DG Abd Portable 1V  Result Date: 05/11/2021 CLINICAL DATA:  Abdominal distension. EXAM: PORTABLE ABDOMEN - 1 VIEW COMPARISON:  CT abdomen and pelvis report only 01/13/2000. FINDINGS: There is gaseous distention of the stomach. Large and small bowel loops are nondilated. There is a very large amount of stool throughout the colon. No suspicious calcifications are identified. Visualized lung bases are clear. No acute fractures are seen. Degenerative changes affect the hips. IMPRESSION: 1. Gaseous distention of  the stomach, nonspecific. 2. Large amount of stool throughout the colon. Electronically Signed   By: Ronney Asters M.D.   On: 05/11/2021 19:14   DG Humerus Right  Result Date: 04/17/2021 CLINICAL DATA:  Unwitnessed fall. EXAM: RIGHT HUMERUS - 2+ VIEW COMPARISON:  None. FINDINGS: There is no evidence of acute fracture. Mild superior subluxation of the right humeral head is seen with respect to the right glenoid. Mild to moderate severity degenerative changes are also seen involving the right shoulder and right acromioclavicular joint. Soft tissues are unremarkable. IMPRESSION: 1. No acute fracture. 2. Mild superior subluxation of the right humeral head which may be chronic in nature. 3. Mild to moderate severity degenerative changes. Electronically Signed   By: Virgina Norfolk M.D.   On: 04/17/2021 23:27   VAS [place] [name] WITH/WO TBI  Result Date: 04/20/2021  LOWER EXTREMITY DOPPLER STUDY Patient Name:  Penny [name]  Date of Exam:   04/20/2021 Medical Rec #: [id number]     Accession #:    [id number] Date of Birth: 09-Jan-1935     Patient Gender: F Patient Age:   71 years Exam Location:  [facility] Procedure:      VAS [place] [name] WITH/WO TBI Referring Phys: TAYE GONFA --------------------------------------------------------------------------------  Indications: Peripheral artery disease.  Limitations: Today's exam was limited due to bandages and patient positioning. Comparison Study: no prior Performing Technologist: Archie Patten RVS  Examination Guidelines: A complete evaluation includes at minimum, Doppler waveform signals and systolic blood pressure reading at the level of bilateral brachial, anterior tibial, and posterior tibial arteries, when vessel segments are accessible. Bilateral testing is considered an integral part of a complete examination. Photoelectric Plethysmograph (PPG) waveforms and toe systolic pressure readings are included as required and additional duplex testing as needed. Limited  examinations for reoccurring indications may be performed as noted.  [name] Findings: +--------+------------------+-----+---------+--------+ Right   Rt Pressure (mmHg)IndexWaveform Comment  +--------+------------------+-----+---------+--------+ [id number]                    triphasic         +--------+------------------+-----+---------+--------+ PTA     186               1.15 biphasic          +--------+------------------+-----+---------+--------+ DP      197               1.22 triphasic         +--------+------------------+-----+---------+--------+ +--------+-----------------+-----+--------+------------------------------------+ Left    Lt Pressure      IndexWaveformComment                                      (mmHg)                                                             +--------+-----------------+-----+--------+------------------------------------+  [phone]                                                                +--------+-----------------+-----+--------+------------------------------------+ PTA                                   not audible due to patient                                                 positioning                          +--------+-----------------+-----+--------+------------------------------------+ DP      160              0.99 biphasic                                     +--------+-----------------+-----+--------+------------------------------------+ +-------+-----------+-----------+------------+------------+ [name]/TBIToday's ABIToday's TBIPrevious ABIPrevious TBI +-------+-----------+-----------+------------+------------+ Right  1.22       0.64                                +-------+-----------+-----------+------------+------------+ Left   0.99       0.41                                +-------+-----------+-----------+------------+------------+  Summary: Right: Resting right ankle-brachial index is  within normal range. No evidence of significant right lower extremity arterial disease. The right toe-brachial index is abnormal. Left: Resting left ankle-brachial index is within normal range. No evidence of significant left lower extremity arterial disease. The left toe-brachial index is abnormal.  *See table(s) above for measurements and observations.  Electronically signed by Jamelle Haring on 04/20/2021 at 59:24:19 PM.    Final    DG Hip Unilat W or Wo Pelvis 2-3 Views Right  Result Date: 05/08/2021 CLINICAL DATA:  Pressure ulcer right hip and buttock EXAM: DG HIP (WITH OR WITHOUT PELVIS) 2-3V RIGHT COMPARISON:  04/17/2021 FINDINGS: No fracture or malalignment. 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 Bone on bone appearance with sclerosis. Gas collection within the right hip soft tissues presumably related to the history of wound or ulcer. Pubic symphysis and rami appear intact. IMPRESSION: 1. Gas collection within the right hip soft tissues presumably related to the history of wound or ulcer. No definite acute osseous abnormality but would correlate with CT or MRI if continued concern. 2. Severe arthritis of the bilateral hips. Electronically Signed   By: Donavan Foil M.D.   On: 05/08/2021 17:52   DG FEMUR PORT, MIN 2 VIEWS RIGHT  Result Date: 05/09/2021 CLINICAL DATA:  Osteomyelitis of femur. EXAM: RIGHT FEMUR PORTABLE 2 VIEW COMPARISON:  MRI hip and right hip x-ray 05/08/2021 FINDINGS: No evidence for acute fracture. No cortical erosions are identified. Again seen are severe/end-stage degenerative changes of the right hip with  joint space narrowing and osteophyte formation. There also moderate severe degenerative changes of the right knee with severe medial compartment and patellofemoral compartment joint space narrowing and tricompartmental osteophyte formation. Soft tissue swelling and air lateral to the right hip has slightly decreased. There are no new areas  of soft tissue air. Vascular calcifications are seen the soft tissues. IMPRESSION: 1. No acute bony abnormality. 2. Soft tissue swelling and air in the right lateral hip have decreased. No new areas of soft tissue air. 3. Degenerative changes of the right knee and right hip appear stable. Electronically Signed   By: Ronney Asters M.D.   On: 05/09/2021 19:29      Subjective: No concerns this morning.  Discharge Exam: Vitals:   05/16/21 0558 05/16/21 0955  BP: 109/64 124/67  Pulse: (!) 49 (!) 53  Resp: 18 15  Temp: 98.4 F (36.9 C) 98.4 F (36.9 C)  SpO2: 98% 100%   Vitals:   05/15/21 2052 05/16/21 0500 05/16/21 0558 05/16/21 0955  BP: (!) 116/56  109/64 124/67  Pulse: (!) 58  (!) 49 (!) 53  Resp: 17  18 15   Temp: 98.8 F (37.1 C)  98.4 F (36.9 C) 98.4 F (36.9 C)  TempSrc:    Oral  SpO2: 98%  98% 100%  Weight:  73.9 kg    Height:        General exam: Appears calm and comfortable Respiratory system: Clear to auscultation. Respiratory effort normal. Cardiovascular system: S1 & S2 heard, RRR. No murmurs, rubs, gallops or clicks. Gastrointestinal system: Abdomen is distended, soft and nontender. No organomegaly or masses felt. Normal bowel sounds heard. Central nervous system: Alert and oriented. No focal neurological deficits. Musculoskeletal: No edema. No calf tenderness Skin: No cyanosis. No rashes Psychiatry: Judgement and insight appear normal. Mood & affect appropriate.    The results of significant diagnostics from this hospitalization (including imaging, microbiology, ancillary and laboratory) are listed below for reference.     Microbiology: Recent Results (from the past 240 hour(s))  Resp Panel by RT-PCR (Flu A&B, Covid) Nasopharyngeal Swab     Status: None   Collection Time: 05/08/21  8:50 PM   Specimen: Nasopharyngeal Swab; Nasopharyngeal(NP) swabs in vial transport medium  Result Value Ref Range Status   SARS Coronavirus 2 by RT PCR NEGATIVE NEGATIVE Final     Comment: (NOTE) SARS-CoV-2 target nucleic acids are NOT DETECTED.  The SARS-CoV-2 RNA is generally detectable in upper respiratory specimens during the acute phase of infection. The lowest concentration of SARS-CoV-2 viral copies this assay can detect is 138 copies/mL. A negative result does not preclude SARS-Cov-2 infection and should not be used as the sole basis for treatment or other patient management decisions. A negative result may occur with  improper specimen collection/handling, submission of specimen other than nasopharyngeal swab, presence of viral mutation(s) within the areas targeted by this assay, and inadequate number of viral copies(<138 copies/mL). A negative result must be combined with clinical observations, patient history, and epidemiological information. The expected result is Negative.  Fact Sheet for Patients:  EntrepreneurPulse.com.au  Fact Sheet for Healthcare Providers:  IncredibleEmployment.be  This test is no t yet approved or cleared by the [place] FDA and  has been authorized for detection and/or diagnosis of SARS-CoV-2 by FDA under an Emergency Use Authorization (EUA). This EUA will remain  in effect (meaning this test can be used) for the duration of the COVID-19 declaration under Section 564(b)(1) of the Act, 21 U.S.C.section 360bbb-3(b)(1), unless  the authorization is terminated  or revoked sooner.       Influenza A by PCR NEGATIVE NEGATIVE Final   Influenza B by PCR NEGATIVE NEGATIVE Final    Comment: (NOTE) The Xpert Xpress SARS-CoV-2/FLU/RSV plus assay is intended as an aid in the diagnosis of influenza from Nasopharyngeal swab specimens and should not be used as a sole basis for treatment. Nasal washings and aspirates are unacceptable for Xpert Xpress SARS-CoV-2/FLU/RSV testing.  Fact Sheet for Patients: EntrepreneurPulse.com.au  Fact Sheet for Healthcare  Providers: IncredibleEmployment.be  This test is not yet approved or cleared by the [place] FDA and has been authorized for detection and/or diagnosis of SARS-CoV-2 by FDA under an Emergency Use Authorization (EUA). This EUA will remain in effect (meaning this test can be used) for the duration of the COVID-19 declaration under Section 564(b)(1) of the Act, 21 U.S.C. section 360bbb-3(b)(1), unless the authorization is terminated or revoked.  Performed at [facility], [place] [address], [place], [place] [address]   MRSA Next Gen by PCR, Nasal     Status: Abnormal   Collection Time: 05/11/21  4:00 AM   Specimen: Nasal Mucosa; Nasal Swab  Result Value Ref Range Status   MRSA by PCR Next Gen DETECTED (A) NOT DETECTED Final    Comment: RESULT CALLED TO, READ BACK BY AND VERIFIED WITH: YUNT,H. RN @1135  ON 10.27.2022 BY NMCCOY (NOTE) The GeneXpert MRSA Assay (FDA approved for NASAL specimens only), is one component of a comprehensive MRSA colonization surveillance program. It is not intended to diagnose MRSA infection nor to guide or monitor treatment for MRSA infections. Test performance is not FDA approved in patients less than 22 years old. Performed at [facility], [place] [address], [place], [place] [address]   Aerobic/Anaerobic Culture w Gram Stain (surgical/deep wound)     Status: None   Collection Time: 05/11/21  8:03 AM   Specimen: PATH Other; Tissue  Result Value Ref Range Status   Specimen Description   Final    TISSUE RIGHT HIP Performed at [place] [address], [place], [place] [address]    Special Requests   Final    NONE Performed at [facility], [place] [address], [place], [place] [address]    Gram Stain   Final    MODERATE WBC PRESENT, PREDOMINANTLY PMN MODERATE GRAM NEGATIVE RODS FEW GRAM POSITIVE COCCI    Culture   Final    RARE STAPHYLOCOCCUS AUREUS RARE  ENTEROCOCCUS FAECALIS MODERATE DIPHTHEROIDS(CORYNEBACTERIUM SPECIES) Standardized susceptibility testing for this organism is not available. MODERATE BACTEROIDES FRAGILIS BETA LACTAMASE POSITIVE Performed at [facility], [place] [address] [place], [place] [address]    Report Status 05/14/2021 FINAL  Final   Organism ID, Bacteria STAPHYLOCOCCUS AUREUS  Final   Organism ID, Bacteria ENTEROCOCCUS FAECALIS  Final      Susceptibility   Enterococcus faecalis - MIC*    AMPICILLIN <=2 SENSITIVE Sensitive     VANCOMYCIN 2 SENSITIVE Sensitive     GENTAMICIN SYNERGY SENSITIVE Sensitive     * RARE ENTEROCOCCUS FAECALIS   Staphylococcus aureus - MIC*    CIPROFLOXACIN <=0.5 SENSITIVE Sensitive     ERYTHROMYCIN <=0.25 SENSITIVE Sensitive     GENTAMICIN <=0.5 SENSITIVE Sensitive     OXACILLIN <=0.25 SENSITIVE Sensitive     TETRACYCLINE <=1 SENSITIVE Sensitive     VANCOMYCIN 1 SENSITIVE Sensitive     TRIMETH/SULFA <=10 SENSITIVE Sensitive     CLINDAMYCIN <=0.25 SENSITIVE  Sensitive     RIFAMPIN <=0.5 SENSITIVE Sensitive     Inducible Clindamycin NEGATIVE Sensitive     * RARE STAPHYLOCOCCUS AUREUS     Labs: BNP (last 3 results) No results for input(s): BNP in the last 8760 hours. Basic Metabolic Panel: Recent Labs  Lab 05/10/21 0458 05/11/21 1305 05/12/21 0439 05/13/21 0526 05/15/21 0341 05/16/21 0320  NA 134* 139 136 135  --   --   K 4.3 5.6* 4.5 4.4  --   --   CL 106 110 107 104  --   --   CO2 22 24 27 29   --   --   GLUCOSE 113* 120* 119* 122*  --   --   BUN 36* 46* 48* 59*  --   --   CREATININE 1.11* 1.47* 1.44* 1.16* 1.12* 1.09*  CALCIUM 7.9* 8.4* 8.3* 8.5*  --   --    Liver Function Tests: No results for input(s): AST, ALT, ALKPHOS, BILITOT, PROT, ALBUMIN in the last 168 hours. No results for input(s): LIPASE, AMYLASE in the last 168 hours. No results for input(s): AMMONIA in the last 168 hours. CBC: Recent Labs  Lab 05/10/21 0458 05/11/21 1305 05/12/21 0439  05/13/21 0526 05/14/21 0304 05/15/21 0341  WBC 4.6 7.6 5.9 6.3 4.9 4.4  NEUTROABS 3.2  --   --   --   --   --   HGB 7.7* 7.9* 7.2* 7.9* 8.2* 8.3*  HCT 25.7* 23.7* 23.8* 25.6* 27.0* 26.2*  MCV 102.8* 96.0 102.1* 102.0* 104.2* 100.4*  PLT 218 238 201 212 126* 224   Cardiac Enzymes: No results for input(s): CKTOTAL, CKMB, CKMBINDEX, TROPONINI in the last 168 hours. BNP: Invalid input(s): POCBNP CBG: Recent Labs  Lab 05/15/21 1209 05/15/21 1732 05/15/21 2053 05/16/21 0738 05/16/21 1054  GLUCAP 166* 127* 161* 160* 136*   D-Dimer No results for input(s): DDIMER in the last 72 hours. Hgb A1c No results for input(s): HGBA1C in the last 72 hours. Lipid Profile No results for input(s): CHOL, HDL, LDLCALC, TRIG, CHOLHDL, LDLDIRECT in the last 72 hours. Thyroid function studies No results for input(s): TSH, T4TOTAL, T3FREE, THYROIDAB in the last 72 hours.  Invalid input(s): FREET3 Anemia work up No results for input(s): VITAMINB12, FOLATE, FERRITIN, TIBC, IRON, RETICCTPCT in the last 72 hours. Urinalysis    Component Value Date/Time   COLORURINE YELLOW 04/18/2021 1342   APPEARANCEUR HAZY (A) 04/18/2021 1342   LABSPEC 1.024 04/18/2021 1342   PHURINE 5.0 04/18/2021 1342   GLUCOSEU NEGATIVE 04/18/2021 1342   HGBUR NEGATIVE 04/18/2021 1342   BILIRUBINUR NEGATIVE 04/18/2021 1342   KETONESUR 5 (A) 04/18/2021 1342   PROTEINUR 100 (A) 04/18/2021 1342   NITRITE NEGATIVE 04/18/2021 1342   LEUKOCYTESUR NEGATIVE 04/18/2021 1342   Sepsis Labs Invalid input(s): PROCALCITONIN,  WBC,  LACTICIDVEN Microbiology Recent Results (from the past 240 hour(s))  Resp Panel by RT-PCR (Flu A&B, Covid) Nasopharyngeal Swab     Status: None   Collection Time: 05/08/21  8:50 PM   Specimen: Nasopharyngeal Swab; Nasopharyngeal(NP) swabs in vial transport medium  Result Value Ref Range Status   SARS Coronavirus 2 by RT PCR NEGATIVE NEGATIVE Final    Comment: (NOTE) SARS-CoV-2 target nucleic acids are  NOT DETECTED.  The SARS-CoV-2 RNA is generally detectable in upper respiratory specimens during the acute phase of infection. The lowest concentration of SARS-CoV-2 viral copies this assay can detect is 138 copies/mL. A negative result does not preclude SARS-Cov-2 infection and should not be used as  the sole basis for treatment or other patient management decisions. A negative result may occur with  improper specimen collection/handling, submission of specimen other than nasopharyngeal swab, presence of viral mutation(s) within the areas targeted by this assay, and inadequate number of viral copies(<138 copies/mL). A negative result must be combined with clinical observations, patient history, and epidemiological information. The expected result is Negative.  Fact Sheet for Patients:  EntrepreneurPulse.com.au  Fact Sheet for Healthcare Providers:  IncredibleEmployment.be  This test is no t yet approved or cleared by the [place] FDA and  has been authorized for detection and/or diagnosis of SARS-CoV-2 by FDA under an Emergency Use Authorization (EUA). This EUA will remain  in effect (meaning this test can be used) for the duration of the COVID-19 declaration under Section 564(b)(1) of the Act, 21 U.S.C.section 360bbb-3(b)(1), unless the authorization is terminated  or revoked sooner.       Influenza A by PCR NEGATIVE NEGATIVE Final   Influenza B by PCR NEGATIVE NEGATIVE Final    Comment: (NOTE) The Xpert Xpress SARS-CoV-2/FLU/RSV plus assay is intended as an aid in the diagnosis of influenza from Nasopharyngeal swab specimens and should not be used as a sole basis for treatment. Nasal washings and aspirates are unacceptable for Xpert Xpress SARS-CoV-2/FLU/RSV testing.  Fact Sheet for Patients: EntrepreneurPulse.com.au  Fact Sheet for Healthcare Providers: IncredibleEmployment.be  This test is not  yet approved or cleared by the [place] FDA and has been authorized for detection and/or diagnosis of SARS-CoV-2 by FDA under an Emergency Use Authorization (EUA). This EUA will remain in effect (meaning this test can be used) for the duration of the COVID-19 declaration under Section 564(b)(1) of the Act, 21 U.S.C. section 360bbb-3(b)(1), unless the authorization is terminated or revoked.  Performed at [facility], [place] [address] [place], [place] [address]   MRSA Next Gen by PCR, Nasal     Status: Abnormal   Collection Time: 05/11/21  4:00 AM   Specimen: Nasal Mucosa; Nasal Swab  Result Value Ref Range Status   MRSA by PCR Next Gen DETECTED (A) NOT DETECTED Final    Comment: RESULT CALLED TO, READ BACK BY AND VERIFIED WITH: YUNT,H. RN @1135  ON 10.27.2022 BY NMCCOY (NOTE) The GeneXpert MRSA Assay (FDA approved for NASAL specimens only), is one component of a comprehensive MRSA colonization surveillance program. It is not intended to diagnose MRSA infection nor to guide or monitor treatment for MRSA infections. Test performance is not FDA approved in patients less than 42 years old. Performed at [facility], [place] [address] [place], [place] [address]   Aerobic/Anaerobic Culture w Gram Stain (surgical/deep wound)     Status: None   Collection Time: 05/11/21  8:03 AM   Specimen: PATH Other; Tissue  Result Value Ref Range Status   Specimen Description   Final    TISSUE RIGHT HIP Performed at [place] [address] [place], [place] [address]    Special Requests   Final    NONE Performed at [facility], [place] [address] [place], [place] [address]    Gram Stain   Final    MODERATE WBC PRESENT, PREDOMINANTLY PMN MODERATE GRAM NEGATIVE RODS FEW GRAM POSITIVE COCCI    Culture   Final    RARE STAPHYLOCOCCUS AUREUS RARE ENTEROCOCCUS FAECALIS MODERATE DIPHTHEROIDS(CORYNEBACTERIUM  SPECIES) Standardized susceptibility testing for this organism is not available. MODERATE BACTEROIDES FRAGILIS BETA LACTAMASE POSITIVE Performed at [facility], [place]  [address], [place], [place] [address]    Report Status 05/14/2021 FINAL  Final   Organism ID, Bacteria STAPHYLOCOCCUS AUREUS  Final   Organism ID, Bacteria ENTEROCOCCUS FAECALIS  Final      Susceptibility   Enterococcus faecalis - MIC*    AMPICILLIN <=2 SENSITIVE Sensitive     VANCOMYCIN 2 SENSITIVE Sensitive     GENTAMICIN SYNERGY SENSITIVE Sensitive     * RARE ENTEROCOCCUS FAECALIS   Staphylococcus aureus - MIC*    CIPROFLOXACIN <=0.5 SENSITIVE Sensitive     ERYTHROMYCIN <=0.25 SENSITIVE Sensitive     GENTAMICIN <=0.5 SENSITIVE Sensitive     OXACILLIN <=0.25 SENSITIVE Sensitive     TETRACYCLINE <=1 SENSITIVE Sensitive     VANCOMYCIN 1 SENSITIVE Sensitive     TRIMETH/SULFA <=10 SENSITIVE Sensitive     CLINDAMYCIN <=0.25 SENSITIVE Sensitive     RIFAMPIN <=0.5 SENSITIVE Sensitive     Inducible Clindamycin NEGATIVE Sensitive     * RARE STAPHYLOCOCCUS AUREUS     Time coordinating discharge: 35 minutes  SIGNED:   Cordelia Poche, MD [facility] 05/16/2021, 11:26 AM

## 2021-05-16 NOTE — Progress Notes (Addendum)
Attempted to give report to facility, no answer.    RN called facility and gave report to [place], all questions answered.   Pt not in acute distress, wound vac tubing clamped and disconnected, due meds given, discharged to facility with belongings via [name].  Ernie Hew, Pt's niece, left message to her voicemail.

## 2021-05-16 NOTE — TOC Transition Note (Signed)
Transition of Care [facility]) - CM/SW Discharge Note   Patient Details  Name: Penny [name] MRN: [id number] Date of Birth: 1935/03/18  [facility] CM/SW Contact:  Lennart Pall, LCSW Phone Number: 05/16/2021, 1:32 PM   Clinical Narrative:     Pt medically cleared for dc to SNF today and have received insurance authorization.  Have alerted pt and niece and [name] called at 1:15pm today.  RN to call report to [phone].  No further TOC needs.  Final next level of care: Skilled Nursing Facility Barriers to Discharge: Barriers Resolved   Patient Goals and CMS Choice        Discharge Placement   Existing PASRR number confirmed : 05/16/21          Patient chooses bed at: [facility] Patient to be transferred to facility by: [place] Name of family member notified: niece, [name] Patient and family notified of of transfer: 05/16/21  Discharge Plan and Services                DME Arranged: N/A DME Agency: NA                  Social Determinants of Health ([place]) Interventions     Readmission Risk Interventions No flowsheet data found.

## 2021-05-16 NOTE — NC FL2 (Signed)
[place] MEDICAID FL2 LEVEL OF CARE SCREENING TOOL     IDENTIFICATION  Patient Name: Penny [name] Birthdate: 01/11/1935 Sex: female Admission Date (Current Location): 05/08/2021  [facility] and [place] Number:  Herbalist and Address:  [facility],  [place], [place]      Provider Number: [id number]  Attending Physician Name and Address:  Mariel Aloe, MD  Relative Name and Phone Number:  [name] (Niece)   [phone]    Current Level of Care: Hospital Recommended Level of Care: [place] Prior Approval Number:    Date Approved/Denied:   PASRR Number: [id number]  Discharge Plan: SNF    Current Diagnoses: Patient Active Problem List   Diagnosis Date Noted   Protein-calorie malnutrition, severe 05/11/2021   Acute prerenal azotemia 05/09/2021   Acute on chronic anemia 05/09/2021   Dementia ([place])    Pressure injury of right hip, stage 4 (HCC)    Complicated open wound of right hip 05/08/2021   Malnutrition of moderate degree 72/53/6644   Acute metabolic encephalopathy 03/47/4259   Hypothermia due to exposure 04/17/2021   Pressure ulcer, shoulder blades, stage II ([place]) - right posterior shoulder 04/17/2021   Pressure ulcer, hip, right, unstageable ([place]) 04/17/2021   CKD (chronic kidney disease) stage 3, GFR 30-59 ml/min (HCC) 04/17/2021   Pain due to onychomycosis of toenails of both feet 02/01/2021   Type 2 diabetes mellitus with vascular disease ([place]) 02/01/2021    Orientation RESPIRATION BLADDER Height & Weight     Self, Time, Situation, Place  Normal Incontinent, External catheter Weight: 162 lb 14.7 oz (73.9 kg) Height:  5\' 5"  (165.1 cm)  BEHAVIORAL SYMPTOMS/MOOD NEUROLOGICAL BOWEL NUTRITION STATUS      Continent    AMBULATORY STATUS COMMUNICATION OF NEEDS Skin   Limited Assist Verbally Wound Vac                       Personal Care Assistance Level of Assistance  Bathing, Feeding,  Dressing Bathing Assistance: Limited assistance Feeding assistance: Independent Dressing Assistance: Limited assistance     Functional Limitations Info  Sight, Hearing, Speech Sight Info: Impaired Hearing Info: Adequate Speech Info: Adequate    SPECIAL CARE FACTORS FREQUENCY  PT (By licensed PT), OT (By licensed OT)     PT Frequency: 5x/wk OT Frequency: 5x/wk            Contractures Contractures Info: Not present    Additional Factors Info  Allergies, Code Status Code Status Info: DNR Allergies Info: Glipizide           Current Medications (05/16/2021):  This is the current hospital active medication list Current Facility-Administered Medications  Medication Dose Route Frequency Provider Last Rate Last Admin   acetaminophen (TYLENOL) tablet 650 mg  650 mg Oral Q6H PRN [place]field, Amber, PA-C   650 mg at 05/15/21 5638   Or   acetaminophen (TYLENOL) suppository 650 mg  650 mg Rectal Q6H PRN [place], Amber, PA-C       amoxicillin-clavulanate (AUGMENTIN) 400-57 MG/5ML suspension 875 mg  875 mg Oral Q12H Mariel Aloe, MD   875 mg at 05/16/21 1206   bisacodyl (DULCOLAX) suppository 10 mg  10 mg Rectal Once Mariel Aloe, MD       Chlorhexidine Gluconate Cloth 2 % PADS 6 each  6 each Topical Q0600 Mariel Aloe, MD   6 each at 05/16/21 0652   diltiazem (CARDIZEM CD) 24 hr capsule 180  mg  180 mg Oral Daily Mariel Aloe, MD   180 mg at 05/16/21 1053   diphenhydrAMINE (BENADRYL) 12.5 MG/5ML elixir 12.5 mg  12.5 mg Oral Q6H PRN Mariel Aloe, MD   12.5 mg at 05/13/21 1800   docusate sodium (COLACE) capsule 100 mg  100 mg Oral BID Porterfield, Amber, PA-C   100 mg at 05/16/21 1025   feeding supplement (ENSURE ENLIVE / ENSURE PLUS) liquid 237 mL  237 mL Oral BID BM Porterfield, Amber, PA-C   237 mL at 05/16/21 1025   hydrALAZINE (APRESOLINE) tablet 25 mg  25 mg Oral Q8H Porterfield, Amber, PA-C   25 mg at 05/16/21 1610   hydrocerin (EUCERIN) cream   Topical BID  Porterfield, Amber, PA-C   1 application at 96/04/54 1026   HYDROcodone-acetaminophen (NORCO/VICODIN) 5-325 MG per tablet 1-2 tablet  1-2 tablet Oral Q4H PRN Porterfield, Amber, PA-C   1 tablet at 05/15/21 2340   insulin aspart (novoLOG) injection 0-9 Units  0-9 Units Subcutaneous TID WC Porterfield, Amber, PA-C   1 Units at 05/16/21 1204   methocarbamol (ROBAXIN) tablet 500 mg  500 mg Oral Q6H PRN Porterfield, Amber, PA-C   500 mg at 05/15/21 0981   Or   methocarbamol (ROBAXIN) 500 mg in dextrose 5 % 50 mL IVPB  500 mg Intravenous Q6H PRN Porterfield, Amber, PA-C       metoCLOPramide (REGLAN) tablet 5-10 mg  5-10 mg Oral Q8H PRN Porterfield, Amber, PA-C       Or   metoCLOPramide (REGLAN) injection 5-10 mg  5-10 mg Intravenous Q8H PRN Porterfield, Amber, PA-C       morphine 2 MG/ML injection 0.5-1 mg  0.5-1 mg Intravenous Q2H PRN Porterfield, Amber, PA-C       multivitamin with minerals tablet 1 tablet  1 tablet Oral Daily Porterfield, Amber, PA-C   1 tablet at 05/16/21 1025   mupirocin ointment (BACTROBAN) 2 % 1 application  1 application Nasal BID Mariel Aloe, MD   1 application at 19/14/78 1030   nutrition supplement (JUVEN) (JUVEN) powder packet 1 packet  1 packet Oral BID BM Porterfield, Amber, PA-C   1 packet at 05/16/21 1025   ondansetron (ZOFRAN) tablet 4 mg  4 mg Oral Q6H PRN Porterfield, Amber, PA-C       Or   ondansetron (ZOFRAN) injection 4 mg  4 mg Intravenous Q6H PRN Porterfield, Amber, PA-C       polyethylene glycol (MIRALAX / GLYCOLAX) packet 17 g  17 g Oral Daily Porterfield, Amber, PA-C   17 g at 05/12/21 1017     Discharge Medications: Please see discharge summary for a list of discharge medications.  Relevant Imaging Results:  Relevant Lab Results:   Additional Information SSN: [id number]  Lennart Pall, LCSW

## 2021-05-18 DIAGNOSIS — I10 Essential (primary) hypertension: Secondary | ICD-10-CM | POA: Diagnosis not present

## 2021-05-18 DIAGNOSIS — L98411 Non-pressure chronic ulcer of buttock limited to breakdown of skin: Secondary | ICD-10-CM | POA: Diagnosis not present

## 2021-05-18 DIAGNOSIS — L89323 Pressure ulcer of left buttock, stage 3: Secondary | ICD-10-CM | POA: Diagnosis not present

## 2021-05-18 DIAGNOSIS — L89214 Pressure ulcer of right hip, stage 4: Secondary | ICD-10-CM | POA: Diagnosis not present

## 2021-05-18 DIAGNOSIS — D509 Iron deficiency anemia, unspecified: Secondary | ICD-10-CM | POA: Diagnosis not present

## 2021-05-18 DIAGNOSIS — E1022 Type 1 diabetes mellitus with diabetic chronic kidney disease: Secondary | ICD-10-CM | POA: Diagnosis not present

## 2021-05-18 DIAGNOSIS — M009 Pyogenic arthritis, unspecified: Secondary | ICD-10-CM | POA: Diagnosis not present

## 2021-05-18 DIAGNOSIS — L89890 Pressure ulcer of other site, unstageable: Secondary | ICD-10-CM | POA: Diagnosis not present

## 2021-05-22 ENCOUNTER — Ambulatory Visit: Payer: Medicare Other | Admitting: Podiatry

## 2021-05-22 DIAGNOSIS — L89894 Pressure ulcer of other site, stage 4: Secondary | ICD-10-CM | POA: Diagnosis not present

## 2021-05-22 DIAGNOSIS — L89323 Pressure ulcer of left buttock, stage 3: Secondary | ICD-10-CM | POA: Diagnosis not present

## 2021-05-22 DIAGNOSIS — L98411 Non-pressure chronic ulcer of buttock limited to breakdown of skin: Secondary | ICD-10-CM | POA: Diagnosis not present

## 2021-05-22 DIAGNOSIS — L89214 Pressure ulcer of right hip, stage 4: Secondary | ICD-10-CM | POA: Diagnosis not present

## 2021-05-22 DIAGNOSIS — L03115 Cellulitis of right lower limb: Secondary | ICD-10-CM | POA: Diagnosis not present

## 2021-05-22 DIAGNOSIS — E1140 Type 2 diabetes mellitus with diabetic neuropathy, unspecified: Secondary | ICD-10-CM | POA: Diagnosis not present

## 2021-06-05 DIAGNOSIS — L89214 Pressure ulcer of right hip, stage 4: Secondary | ICD-10-CM | POA: Diagnosis not present

## 2021-06-05 DIAGNOSIS — L89323 Pressure ulcer of left buttock, stage 3: Secondary | ICD-10-CM | POA: Diagnosis not present

## 2021-06-05 DIAGNOSIS — L299 Pruritus, unspecified: Secondary | ICD-10-CM | POA: Diagnosis not present

## 2021-06-05 DIAGNOSIS — L89894 Pressure ulcer of other site, stage 4: Secondary | ICD-10-CM | POA: Diagnosis not present

## 2021-06-05 DIAGNOSIS — L309 Dermatitis, unspecified: Secondary | ICD-10-CM | POA: Diagnosis not present

## 2021-06-06 ENCOUNTER — Ambulatory Visit: Payer: Medicare Other | Admitting: Podiatry

## 2021-06-12 DIAGNOSIS — L89323 Pressure ulcer of left buttock, stage 3: Secondary | ICD-10-CM | POA: Diagnosis not present

## 2021-06-12 DIAGNOSIS — L89894 Pressure ulcer of other site, stage 4: Secondary | ICD-10-CM | POA: Diagnosis not present

## 2021-06-12 DIAGNOSIS — L89214 Pressure ulcer of right hip, stage 4: Secondary | ICD-10-CM | POA: Diagnosis not present

## 2021-06-14 DIAGNOSIS — L89894 Pressure ulcer of other site, stage 4: Secondary | ICD-10-CM | POA: Diagnosis not present

## 2021-06-14 DIAGNOSIS — L89214 Pressure ulcer of right hip, stage 4: Secondary | ICD-10-CM | POA: Diagnosis not present

## 2021-06-14 DIAGNOSIS — L03115 Cellulitis of right lower limb: Secondary | ICD-10-CM | POA: Diagnosis not present

## 2021-06-14 DIAGNOSIS — L89323 Pressure ulcer of left buttock, stage 3: Secondary | ICD-10-CM | POA: Diagnosis not present

## 2021-06-14 DIAGNOSIS — M79604 Pain in right leg: Secondary | ICD-10-CM | POA: Diagnosis not present

## 2021-06-14 DIAGNOSIS — M25551 Pain in right hip: Secondary | ICD-10-CM | POA: Diagnosis not present

## 2021-06-20 DIAGNOSIS — L03115 Cellulitis of right lower limb: Secondary | ICD-10-CM | POA: Diagnosis not present

## 2021-06-20 DIAGNOSIS — Z1159 Encounter for screening for other viral diseases: Secondary | ICD-10-CM | POA: Diagnosis not present

## 2021-06-20 DIAGNOSIS — M6281 Muscle weakness (generalized): Secondary | ICD-10-CM | POA: Diagnosis not present

## 2021-06-21 DIAGNOSIS — M6281 Muscle weakness (generalized): Secondary | ICD-10-CM | POA: Diagnosis not present

## 2021-06-21 DIAGNOSIS — L89214 Pressure ulcer of right hip, stage 4: Secondary | ICD-10-CM | POA: Diagnosis not present

## 2021-06-21 DIAGNOSIS — L89894 Pressure ulcer of other site, stage 4: Secondary | ICD-10-CM | POA: Diagnosis not present

## 2021-06-21 DIAGNOSIS — L03115 Cellulitis of right lower limb: Secondary | ICD-10-CM | POA: Diagnosis not present

## 2021-06-21 DIAGNOSIS — Z1159 Encounter for screening for other viral diseases: Secondary | ICD-10-CM | POA: Diagnosis not present

## 2021-06-22 DIAGNOSIS — L89214 Pressure ulcer of right hip, stage 4: Secondary | ICD-10-CM | POA: Diagnosis not present

## 2021-06-22 DIAGNOSIS — Z1159 Encounter for screening for other viral diseases: Secondary | ICD-10-CM | POA: Diagnosis not present

## 2021-06-22 DIAGNOSIS — E1159 Type 2 diabetes mellitus with other circulatory complications: Secondary | ICD-10-CM | POA: Diagnosis not present

## 2021-06-22 DIAGNOSIS — L03115 Cellulitis of right lower limb: Secondary | ICD-10-CM | POA: Diagnosis not present

## 2021-06-22 DIAGNOSIS — M6281 Muscle weakness (generalized): Secondary | ICD-10-CM | POA: Diagnosis not present

## 2021-06-22 DIAGNOSIS — L299 Pruritus, unspecified: Secondary | ICD-10-CM | POA: Diagnosis not present

## 2021-06-23 DIAGNOSIS — M6281 Muscle weakness (generalized): Secondary | ICD-10-CM | POA: Diagnosis not present

## 2021-06-23 DIAGNOSIS — L03115 Cellulitis of right lower limb: Secondary | ICD-10-CM | POA: Diagnosis not present

## 2021-06-23 DIAGNOSIS — Z1159 Encounter for screening for other viral diseases: Secondary | ICD-10-CM | POA: Diagnosis not present

## 2021-06-24 DIAGNOSIS — Z1159 Encounter for screening for other viral diseases: Secondary | ICD-10-CM | POA: Diagnosis not present

## 2021-06-24 DIAGNOSIS — L03115 Cellulitis of right lower limb: Secondary | ICD-10-CM | POA: Diagnosis not present

## 2021-06-24 DIAGNOSIS — M6281 Muscle weakness (generalized): Secondary | ICD-10-CM | POA: Diagnosis not present

## 2021-06-25 DIAGNOSIS — L03115 Cellulitis of right lower limb: Secondary | ICD-10-CM | POA: Diagnosis not present

## 2021-06-25 DIAGNOSIS — Z1159 Encounter for screening for other viral diseases: Secondary | ICD-10-CM | POA: Diagnosis not present

## 2021-06-25 DIAGNOSIS — M6281 Muscle weakness (generalized): Secondary | ICD-10-CM | POA: Diagnosis not present

## 2021-06-26 DIAGNOSIS — M6281 Muscle weakness (generalized): Secondary | ICD-10-CM | POA: Diagnosis not present

## 2021-06-26 DIAGNOSIS — L89894 Pressure ulcer of other site, stage 4: Secondary | ICD-10-CM | POA: Diagnosis not present

## 2021-06-26 DIAGNOSIS — L03115 Cellulitis of right lower limb: Secondary | ICD-10-CM | POA: Diagnosis not present

## 2021-06-26 DIAGNOSIS — L89214 Pressure ulcer of right hip, stage 4: Secondary | ICD-10-CM | POA: Diagnosis not present

## 2021-06-26 DIAGNOSIS — Z1159 Encounter for screening for other viral diseases: Secondary | ICD-10-CM | POA: Diagnosis not present

## 2021-06-27 DIAGNOSIS — Z1159 Encounter for screening for other viral diseases: Secondary | ICD-10-CM | POA: Diagnosis not present

## 2021-06-27 DIAGNOSIS — M6281 Muscle weakness (generalized): Secondary | ICD-10-CM | POA: Diagnosis not present

## 2021-06-27 DIAGNOSIS — L03115 Cellulitis of right lower limb: Secondary | ICD-10-CM | POA: Diagnosis not present

## 2021-06-28 DIAGNOSIS — L03115 Cellulitis of right lower limb: Secondary | ICD-10-CM | POA: Diagnosis not present

## 2021-06-28 DIAGNOSIS — Z1159 Encounter for screening for other viral diseases: Secondary | ICD-10-CM | POA: Diagnosis not present

## 2021-06-28 DIAGNOSIS — M6281 Muscle weakness (generalized): Secondary | ICD-10-CM | POA: Diagnosis not present

## 2021-06-29 DIAGNOSIS — L89894 Pressure ulcer of other site, stage 4: Secondary | ICD-10-CM | POA: Diagnosis not present

## 2021-06-29 DIAGNOSIS — M6281 Muscle weakness (generalized): Secondary | ICD-10-CM | POA: Diagnosis not present

## 2021-06-29 DIAGNOSIS — L03115 Cellulitis of right lower limb: Secondary | ICD-10-CM | POA: Diagnosis not present

## 2021-06-29 DIAGNOSIS — Z1159 Encounter for screening for other viral diseases: Secondary | ICD-10-CM | POA: Diagnosis not present

## 2021-06-30 DIAGNOSIS — Z1159 Encounter for screening for other viral diseases: Secondary | ICD-10-CM | POA: Diagnosis not present

## 2021-06-30 DIAGNOSIS — R051 Acute cough: Secondary | ICD-10-CM | POA: Diagnosis not present

## 2021-06-30 DIAGNOSIS — R55 Syncope and collapse: Secondary | ICD-10-CM | POA: Diagnosis not present

## 2021-06-30 DIAGNOSIS — R112 Nausea with vomiting, unspecified: Secondary | ICD-10-CM | POA: Diagnosis not present

## 2021-06-30 DIAGNOSIS — R6883 Chills (without fever): Secondary | ICD-10-CM | POA: Diagnosis not present

## 2021-06-30 DIAGNOSIS — M6281 Muscle weakness (generalized): Secondary | ICD-10-CM | POA: Diagnosis not present

## 2021-06-30 DIAGNOSIS — L03115 Cellulitis of right lower limb: Secondary | ICD-10-CM | POA: Diagnosis not present

## 2021-06-30 DIAGNOSIS — E119 Type 2 diabetes mellitus without complications: Secondary | ICD-10-CM | POA: Diagnosis not present

## 2021-06-30 DIAGNOSIS — R5381 Other malaise: Secondary | ICD-10-CM | POA: Diagnosis not present

## 2021-06-30 DIAGNOSIS — D649 Anemia, unspecified: Secondary | ICD-10-CM | POA: Diagnosis not present

## 2021-06-30 DIAGNOSIS — I509 Heart failure, unspecified: Secondary | ICD-10-CM | POA: Diagnosis not present

## 2021-06-30 DIAGNOSIS — L89214 Pressure ulcer of right hip, stage 4: Secondary | ICD-10-CM | POA: Diagnosis not present

## 2021-06-30 DIAGNOSIS — I10 Essential (primary) hypertension: Secondary | ICD-10-CM | POA: Diagnosis not present

## 2021-06-30 DIAGNOSIS — E785 Hyperlipidemia, unspecified: Secondary | ICD-10-CM | POA: Diagnosis not present

## 2021-07-02 DIAGNOSIS — M6281 Muscle weakness (generalized): Secondary | ICD-10-CM | POA: Diagnosis not present

## 2021-07-02 DIAGNOSIS — Z1159 Encounter for screening for other viral diseases: Secondary | ICD-10-CM | POA: Diagnosis not present

## 2021-07-02 DIAGNOSIS — L03115 Cellulitis of right lower limb: Secondary | ICD-10-CM | POA: Diagnosis not present

## 2021-07-03 ENCOUNTER — Ambulatory Visit: Payer: Medicare Other | Admitting: Podiatry

## 2021-07-03 DIAGNOSIS — R52 Pain, unspecified: Secondary | ICD-10-CM | POA: Diagnosis not present

## 2021-07-03 DIAGNOSIS — L03115 Cellulitis of right lower limb: Secondary | ICD-10-CM | POA: Diagnosis not present

## 2021-07-03 DIAGNOSIS — R031 Nonspecific low blood-pressure reading: Secondary | ICD-10-CM | POA: Diagnosis not present

## 2021-07-03 DIAGNOSIS — Z1159 Encounter for screening for other viral diseases: Secondary | ICD-10-CM | POA: Diagnosis not present

## 2021-07-03 DIAGNOSIS — R001 Bradycardia, unspecified: Secondary | ICD-10-CM | POA: Diagnosis not present

## 2021-07-03 DIAGNOSIS — R5381 Other malaise: Secondary | ICD-10-CM | POA: Diagnosis not present

## 2021-07-03 DIAGNOSIS — M6281 Muscle weakness (generalized): Secondary | ICD-10-CM | POA: Diagnosis not present

## 2021-07-03 DIAGNOSIS — R6883 Chills (without fever): Secondary | ICD-10-CM | POA: Diagnosis not present

## 2021-07-04 DIAGNOSIS — L519 Erythema multiforme, unspecified: Secondary | ICD-10-CM | POA: Diagnosis not present

## 2021-07-04 DIAGNOSIS — R600 Localized edema: Secondary | ICD-10-CM | POA: Diagnosis not present

## 2021-07-04 DIAGNOSIS — R109 Unspecified abdominal pain: Secondary | ICD-10-CM | POA: Diagnosis not present

## 2021-07-04 DIAGNOSIS — N390 Urinary tract infection, site not specified: Secondary | ICD-10-CM | POA: Diagnosis not present

## 2021-07-04 DIAGNOSIS — M6281 Muscle weakness (generalized): Secondary | ICD-10-CM | POA: Diagnosis not present

## 2021-07-04 DIAGNOSIS — R140 Abdominal distension (gaseous): Secondary | ICD-10-CM | POA: Diagnosis not present

## 2021-07-04 DIAGNOSIS — L03115 Cellulitis of right lower limb: Secondary | ICD-10-CM | POA: Diagnosis not present

## 2021-07-04 DIAGNOSIS — E119 Type 2 diabetes mellitus without complications: Secondary | ICD-10-CM | POA: Diagnosis not present

## 2021-07-04 DIAGNOSIS — R5383 Other fatigue: Secondary | ICD-10-CM | POA: Diagnosis not present

## 2021-07-04 DIAGNOSIS — Z1159 Encounter for screening for other viral diseases: Secondary | ICD-10-CM | POA: Diagnosis not present

## 2021-07-04 DIAGNOSIS — Z79899 Other long term (current) drug therapy: Secondary | ICD-10-CM | POA: Diagnosis not present

## 2021-07-04 DIAGNOSIS — L89214 Pressure ulcer of right hip, stage 4: Secondary | ICD-10-CM | POA: Diagnosis not present

## 2021-07-04 DIAGNOSIS — R1084 Generalized abdominal pain: Secondary | ICD-10-CM | POA: Diagnosis not present

## 2021-07-04 DIAGNOSIS — D649 Anemia, unspecified: Secondary | ICD-10-CM | POA: Diagnosis not present

## 2021-07-05 DIAGNOSIS — L03115 Cellulitis of right lower limb: Secondary | ICD-10-CM | POA: Diagnosis not present

## 2021-07-05 DIAGNOSIS — L89214 Pressure ulcer of right hip, stage 4: Secondary | ICD-10-CM | POA: Diagnosis not present

## 2021-07-05 DIAGNOSIS — L539 Erythematous condition, unspecified: Secondary | ICD-10-CM | POA: Diagnosis not present

## 2021-07-05 DIAGNOSIS — D649 Anemia, unspecified: Secondary | ICD-10-CM | POA: Diagnosis not present

## 2021-07-05 DIAGNOSIS — R5381 Other malaise: Secondary | ICD-10-CM | POA: Diagnosis not present

## 2021-07-05 DIAGNOSIS — Z1159 Encounter for screening for other viral diseases: Secondary | ICD-10-CM | POA: Diagnosis not present

## 2021-07-05 DIAGNOSIS — E785 Hyperlipidemia, unspecified: Secondary | ICD-10-CM | POA: Diagnosis not present

## 2021-07-05 DIAGNOSIS — K5900 Constipation, unspecified: Secondary | ICD-10-CM | POA: Diagnosis not present

## 2021-07-05 DIAGNOSIS — E119 Type 2 diabetes mellitus without complications: Secondary | ICD-10-CM | POA: Diagnosis not present

## 2021-07-05 DIAGNOSIS — I509 Heart failure, unspecified: Secondary | ICD-10-CM | POA: Diagnosis not present

## 2021-07-05 DIAGNOSIS — I10 Essential (primary) hypertension: Secondary | ICD-10-CM | POA: Diagnosis not present

## 2021-07-05 DIAGNOSIS — L89894 Pressure ulcer of other site, stage 4: Secondary | ICD-10-CM | POA: Diagnosis not present

## 2021-07-05 DIAGNOSIS — R600 Localized edema: Secondary | ICD-10-CM | POA: Diagnosis not present

## 2021-07-05 DIAGNOSIS — M6281 Muscle weakness (generalized): Secondary | ICD-10-CM | POA: Diagnosis not present

## 2021-07-06 ENCOUNTER — Emergency Department (HOSPITAL_COMMUNITY): Payer: Medicare Other

## 2021-07-06 ENCOUNTER — Encounter (HOSPITAL_COMMUNITY): Payer: Self-pay

## 2021-07-06 ENCOUNTER — Inpatient Hospital Stay (HOSPITAL_COMMUNITY): Payer: Medicare Other

## 2021-07-06 ENCOUNTER — Inpatient Hospital Stay (HOSPITAL_COMMUNITY)
Admission: EM | Admit: 2021-07-06 | Discharge: 2021-07-11 | DRG: 871 | Disposition: A | Discharge status: Skilled Nursing Facility | Payer: Medicare Other | Source: Skilled Nursing Facility | Attending: Internal Medicine | Admitting: Internal Medicine

## 2021-07-06 DIAGNOSIS — L03116 Cellulitis of left lower limb: Secondary | ICD-10-CM | POA: Diagnosis present

## 2021-07-06 DIAGNOSIS — Z1612 Extended spectrum beta lactamase (ESBL) resistance: Secondary | ICD-10-CM | POA: Diagnosis present

## 2021-07-06 DIAGNOSIS — D631 Anemia in chronic kidney disease: Secondary | ICD-10-CM | POA: Diagnosis not present

## 2021-07-06 DIAGNOSIS — L8996 Pressure-induced deep tissue damage of unspecified site: Secondary | ICD-10-CM | POA: Diagnosis not present

## 2021-07-06 DIAGNOSIS — N179 Acute kidney failure, unspecified: Secondary | ICD-10-CM | POA: Diagnosis not present

## 2021-07-06 DIAGNOSIS — Z7982 Long term (current) use of aspirin: Secondary | ICD-10-CM

## 2021-07-06 DIAGNOSIS — A419 Sepsis, unspecified organism: Secondary | ICD-10-CM

## 2021-07-06 DIAGNOSIS — I129 Hypertensive chronic kidney disease with stage 1 through stage 4 chronic kidney disease, or unspecified chronic kidney disease: Secondary | ICD-10-CM | POA: Diagnosis not present

## 2021-07-06 DIAGNOSIS — K5900 Constipation, unspecified: Secondary | ICD-10-CM | POA: Diagnosis present

## 2021-07-06 DIAGNOSIS — R0902 Hypoxemia: Secondary | ICD-10-CM | POA: Diagnosis not present

## 2021-07-06 DIAGNOSIS — E1122 Type 2 diabetes mellitus with diabetic chronic kidney disease: Secondary | ICD-10-CM | POA: Diagnosis not present

## 2021-07-06 DIAGNOSIS — F0390 Unspecified dementia without behavioral disturbance: Secondary | ICD-10-CM | POA: Diagnosis present

## 2021-07-06 DIAGNOSIS — R6520 Severe sepsis without septic shock: Secondary | ICD-10-CM | POA: Diagnosis not present

## 2021-07-06 DIAGNOSIS — M160 Bilateral primary osteoarthritis of hip: Secondary | ICD-10-CM | POA: Diagnosis not present

## 2021-07-06 DIAGNOSIS — L89893 Pressure ulcer of other site, stage 3: Secondary | ICD-10-CM | POA: Diagnosis not present

## 2021-07-06 DIAGNOSIS — J9811 Atelectasis: Secondary | ICD-10-CM | POA: Diagnosis not present

## 2021-07-06 DIAGNOSIS — Z66 Do not resuscitate: Secondary | ICD-10-CM | POA: Diagnosis not present

## 2021-07-06 DIAGNOSIS — Z7984 Long term (current) use of oral hypoglycemic drugs: Secondary | ICD-10-CM | POA: Diagnosis not present

## 2021-07-06 DIAGNOSIS — R059 Cough, unspecified: Secondary | ICD-10-CM | POA: Diagnosis not present

## 2021-07-06 DIAGNOSIS — Z87891 Personal history of nicotine dependence: Secondary | ICD-10-CM | POA: Diagnosis not present

## 2021-07-06 DIAGNOSIS — Z79899 Other long term (current) drug therapy: Secondary | ICD-10-CM

## 2021-07-06 DIAGNOSIS — E1159 Type 2 diabetes mellitus with other circulatory complications: Secondary | ICD-10-CM | POA: Diagnosis not present

## 2021-07-06 DIAGNOSIS — E875 Hyperkalemia: Secondary | ICD-10-CM | POA: Diagnosis not present

## 2021-07-06 DIAGNOSIS — A4150 Gram-negative sepsis, unspecified: Principal | ICD-10-CM | POA: Diagnosis present

## 2021-07-06 DIAGNOSIS — R509 Fever, unspecified: Secondary | ICD-10-CM | POA: Diagnosis not present

## 2021-07-06 DIAGNOSIS — Z7401 Bed confinement status: Secondary | ICD-10-CM | POA: Diagnosis not present

## 2021-07-06 DIAGNOSIS — N189 Chronic kidney disease, unspecified: Secondary | ICD-10-CM | POA: Diagnosis present

## 2021-07-06 DIAGNOSIS — R911 Solitary pulmonary nodule: Secondary | ICD-10-CM | POA: Diagnosis not present

## 2021-07-06 DIAGNOSIS — R918 Other nonspecific abnormal finding of lung field: Secondary | ICD-10-CM | POA: Diagnosis not present

## 2021-07-06 DIAGNOSIS — R6889 Other general symptoms and signs: Secondary | ICD-10-CM | POA: Diagnosis not present

## 2021-07-06 DIAGNOSIS — Z888 Allergy status to other drugs, medicaments and biological substances status: Secondary | ICD-10-CM

## 2021-07-06 DIAGNOSIS — J479 Bronchiectasis, uncomplicated: Secondary | ICD-10-CM | POA: Diagnosis not present

## 2021-07-06 DIAGNOSIS — K449 Diaphragmatic hernia without obstruction or gangrene: Secondary | ICD-10-CM | POA: Diagnosis not present

## 2021-07-06 DIAGNOSIS — R600 Localized edema: Secondary | ICD-10-CM | POA: Diagnosis present

## 2021-07-06 DIAGNOSIS — E8809 Other disorders of plasma-protein metabolism, not elsewhere classified: Secondary | ICD-10-CM | POA: Diagnosis present

## 2021-07-06 DIAGNOSIS — A630 Anogenital (venereal) warts: Secondary | ICD-10-CM | POA: Diagnosis present

## 2021-07-06 DIAGNOSIS — Z794 Long term (current) use of insulin: Secondary | ICD-10-CM | POA: Diagnosis not present

## 2021-07-06 DIAGNOSIS — Z20822 Contact with and (suspected) exposure to covid-19: Secondary | ICD-10-CM | POA: Diagnosis not present

## 2021-07-06 DIAGNOSIS — N1831 Chronic kidney disease, stage 3a: Secondary | ICD-10-CM | POA: Diagnosis present

## 2021-07-06 DIAGNOSIS — L89214 Pressure ulcer of right hip, stage 4: Secondary | ICD-10-CM | POA: Diagnosis not present

## 2021-07-06 DIAGNOSIS — R739 Hyperglycemia, unspecified: Secondary | ICD-10-CM | POA: Diagnosis not present

## 2021-07-06 DIAGNOSIS — L89152 Pressure ulcer of sacral region, stage 2: Secondary | ICD-10-CM | POA: Diagnosis present

## 2021-07-06 DIAGNOSIS — E1169 Type 2 diabetes mellitus with other specified complication: Secondary | ICD-10-CM | POA: Diagnosis present

## 2021-07-06 DIAGNOSIS — N200 Calculus of kidney: Secondary | ICD-10-CM | POA: Diagnosis not present

## 2021-07-06 DIAGNOSIS — E041 Nontoxic single thyroid nodule: Secondary | ICD-10-CM | POA: Diagnosis present

## 2021-07-06 DIAGNOSIS — I82403 Acute embolism and thrombosis of unspecified deep veins of lower extremity, bilateral: Secondary | ICD-10-CM | POA: Diagnosis not present

## 2021-07-06 DIAGNOSIS — E872 Acidosis, unspecified: Secondary | ICD-10-CM | POA: Diagnosis not present

## 2021-07-06 DIAGNOSIS — L8990 Pressure ulcer of unspecified site, unspecified stage: Secondary | ICD-10-CM | POA: Diagnosis present

## 2021-07-06 DIAGNOSIS — L03115 Cellulitis of right lower limb: Secondary | ICD-10-CM | POA: Diagnosis present

## 2021-07-06 DIAGNOSIS — R7989 Other specified abnormal findings of blood chemistry: Secondary | ICD-10-CM | POA: Diagnosis not present

## 2021-07-06 DIAGNOSIS — N390 Urinary tract infection, site not specified: Secondary | ICD-10-CM | POA: Diagnosis not present

## 2021-07-06 DIAGNOSIS — F03B0 Unspecified dementia, moderate, without behavioral disturbance, psychotic disturbance, mood disturbance, and anxiety: Secondary | ICD-10-CM | POA: Diagnosis not present

## 2021-07-06 DIAGNOSIS — Z743 Need for continuous supervision: Secondary | ICD-10-CM | POA: Diagnosis not present

## 2021-07-06 DIAGNOSIS — L03119 Cellulitis of unspecified part of limb: Secondary | ICD-10-CM | POA: Diagnosis present

## 2021-07-06 LAB — COMPREHENSIVE METABOLIC PANEL
ALT: 19 U/L (ref 0–44)
AST: 23 U/L (ref 15–41)
Albumin: 1.8 g/dL — ABNORMAL LOW (ref 3.5–5.0)
Alkaline Phosphatase: 79 U/L (ref 38–126)
Anion gap: 8 (ref 5–15)
BUN: 85 mg/dL — ABNORMAL HIGH (ref 8–23)
CO2: 22 mmol/L (ref 22–32)
Calcium: 8.6 mg/dL — ABNORMAL LOW (ref 8.9–10.3)
Chloride: 104 mmol/L (ref 98–111)
Creatinine, Ser: 1.69 mg/dL — ABNORMAL HIGH (ref 0.44–1.00)
GFR, Estimated: 30 mL/min — ABNORMAL LOW (ref >60)
Glucose, Bld: 245 mg/dL — ABNORMAL HIGH (ref 70–99)
Potassium: 4.8 mmol/L (ref 3.5–5.1)
Sodium: 134 mmol/L — ABNORMAL LOW (ref 135–145)
Total Bilirubin: 0.6 mg/dL (ref 0.3–1.2)
Total Protein: 6.9 g/dL (ref 6.5–8.1)

## 2021-07-06 LAB — URINALYSIS, ROUTINE W REFLEX MICROSCOPIC
Bilirubin Urine: NEGATIVE
Glucose, UA: NEGATIVE mg/dL
Ketones, ur: 5 mg/dL — AB
Nitrite: POSITIVE — AB
Protein, ur: NEGATIVE mg/dL
Specific Gravity, Urine: 1.016 (ref 1.005–1.030)
WBC, UA: >50 WBC/hpf — ABNORMAL HIGH (ref 0–5)
pH: 5 (ref 5.0–8.0)

## 2021-07-06 LAB — CBC WITH DIFFERENTIAL/PLATELET
Abs Immature Granulocytes: 0.16 10*3/uL — ABNORMAL HIGH (ref 0.00–0.07)
Basophils Absolute: 0 10*3/uL (ref 0.0–0.1)
Basophils Relative: 0 %
Eosinophils Absolute: 0 10*3/uL (ref 0.0–0.5)
Eosinophils Relative: 0 %
HCT: 28.1 % — ABNORMAL LOW (ref 36.0–46.0)
Hemoglobin: 8.9 g/dL — ABNORMAL LOW (ref 12.0–15.0)
Immature Granulocytes: 1 %
Lymphocytes Relative: 2 %
Lymphs Abs: 0.3 10*3/uL — ABNORMAL LOW (ref 0.7–4.0)
MCH: 31 pg (ref 26.0–34.0)
MCHC: 31.7 g/dL (ref 30.0–36.0)
MCV: 97.9 fL (ref 80.0–100.0)
Monocytes Absolute: 0.3 10*3/uL (ref 0.1–1.0)
Monocytes Relative: 2 %
Neutro Abs: 17.8 10*3/uL — ABNORMAL HIGH (ref 1.7–7.7)
Neutrophils Relative %: 95 %
Platelets: 171 10*3/uL (ref 150–400)
RBC: 2.87 MIL/uL — ABNORMAL LOW (ref 3.87–5.11)
RDW: 15.8 % — ABNORMAL HIGH (ref 11.5–15.5)
WBC: 18.5 10*3/uL — ABNORMAL HIGH (ref 4.0–10.5)
nRBC: 0 % (ref 0.0–0.2)

## 2021-07-06 LAB — LACTIC ACID, PLASMA
Lactic Acid, Venous: 2 mmol/L (ref 0.5–1.9)
Lactic Acid, Venous: 1.6 mmol/L (ref 0.5–1.9)

## 2021-07-06 LAB — RESP PANEL BY RT-PCR (FLU A&B, COVID) ARPGX2
Influenza A by PCR: NEGATIVE
Influenza B by PCR: NEGATIVE
SARS Coronavirus 2 by RT PCR: NEGATIVE

## 2021-07-06 LAB — PROTIME-INR
INR: 1.1 (ref 0.8–1.2)
Prothrombin Time: 14.6 seconds (ref 11.4–15.2)

## 2021-07-06 LAB — APTT: aPTT: 31 seconds (ref 24–36)

## 2021-07-06 LAB — GLUCOSE, CAPILLARY: Glucose-Capillary: 156 mg/dL — ABNORMAL HIGH (ref 70–99)

## 2021-07-06 IMAGING — DX DG CHEST 1V PORT
1 series · 1 of 1 positions shown · non-contrast
Comparison: Chest radiograph [DATE]

CLINICAL DATA: Fever, cough

EXAM:
PORTABLE CHEST 1 VIEW

[chest]
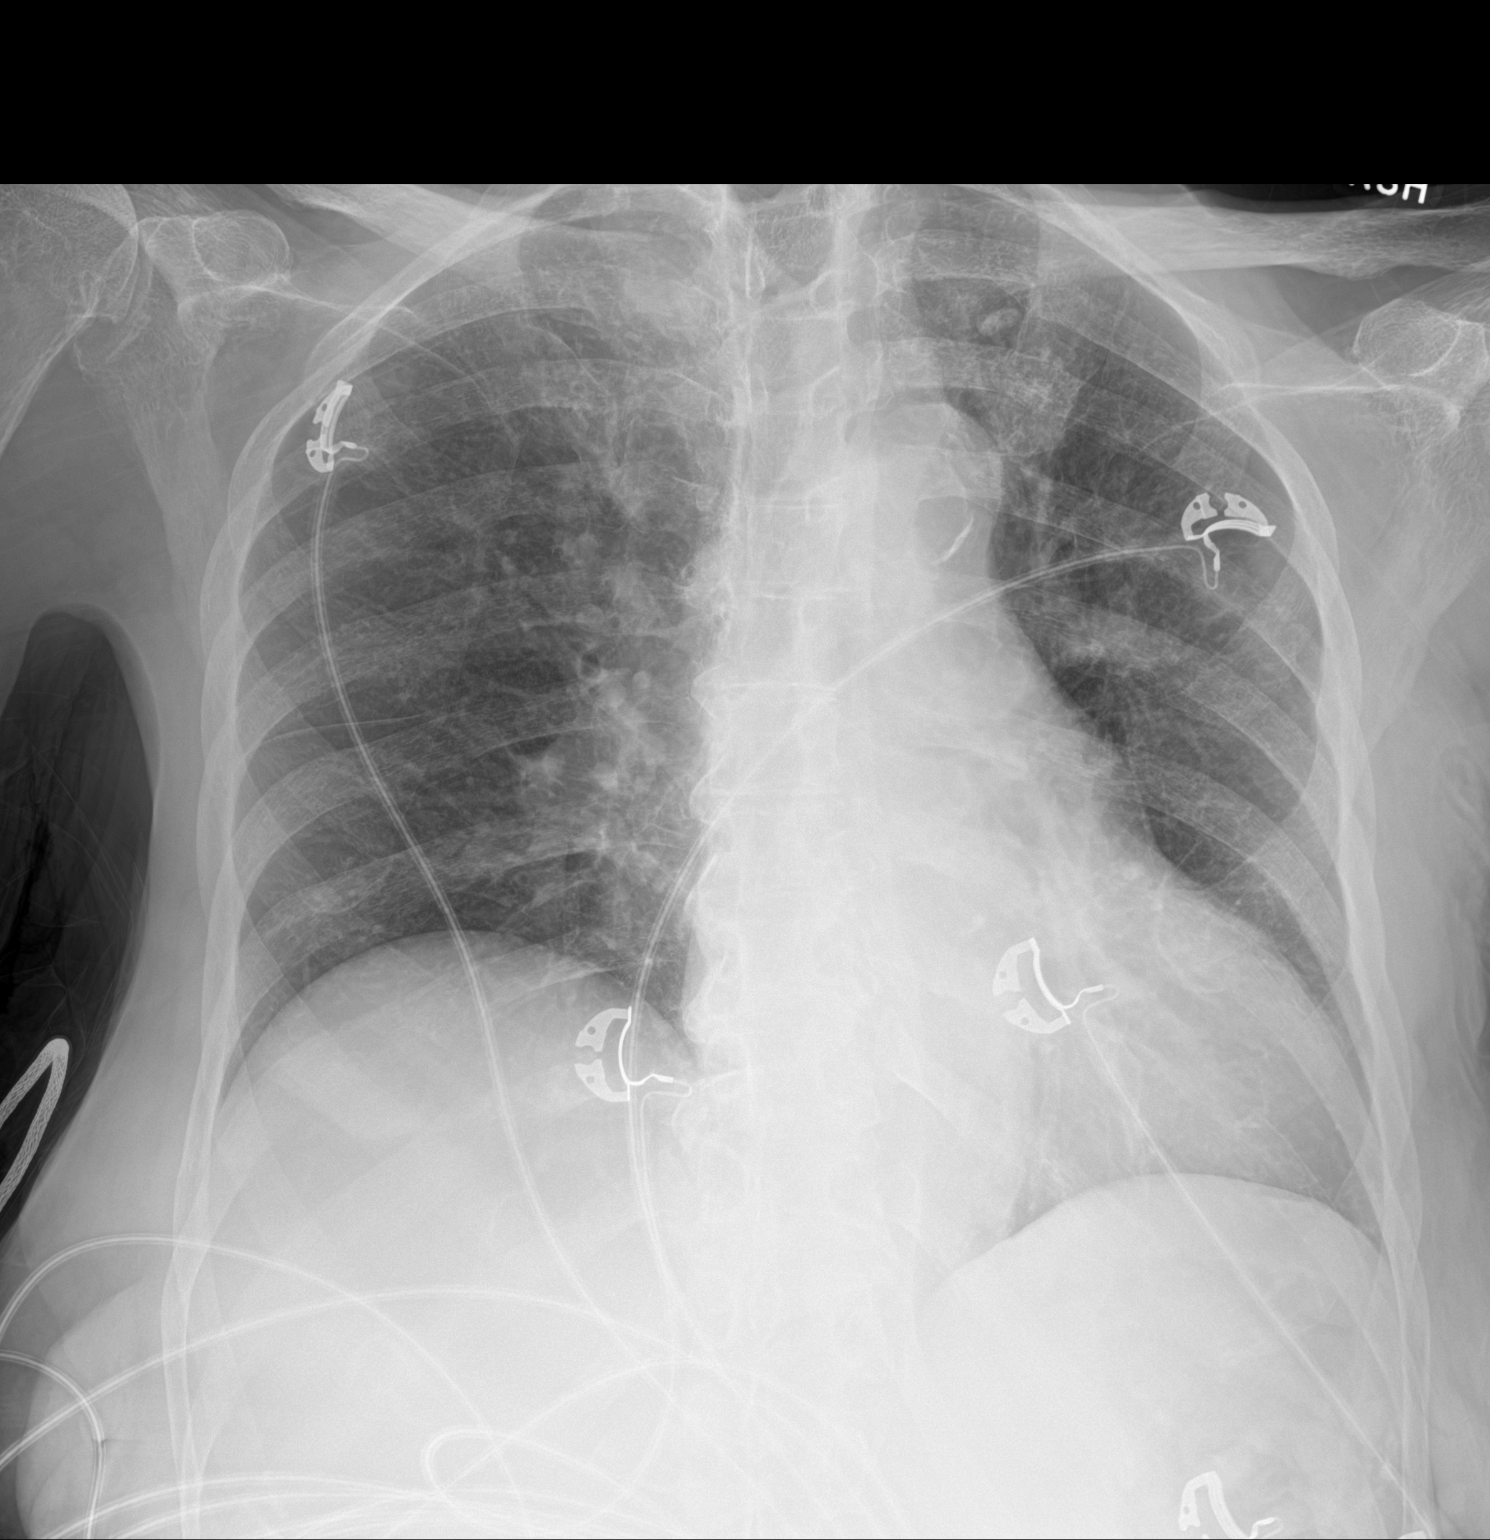

[1 of 1 positions shown; findings below may reference images not displayed]

FINDINGS: Heart is at the upper limits of normal for size. The mediastinal
contours are within normal limits.

There is unchanged asymmetric elevation of the right hemidiaphragm.
There is no focal consolidation or pulmonary edema. There is no
pleural effusion or pneumothorax. There is a 1.0 cm nodular opacity
in the left apex.

There is no acute osseous abnormality.
IMPRESSION: 1. No radiographic evidence of acute cardiopulmonary process.
2. 1.0 cm nodule in the left apex. Recommend nonemergent CT chest
for further evaluation.

## 2021-07-06 IMAGING — DX DG ABD PORTABLE 1V
1 series · 1 of 1 positions shown · non-contrast
Comparison: KUB [DATE]

CLINICAL DATA: Fever, cough

EXAM:
PORTABLE ABDOMEN - 1 VIEW

[abdomen]
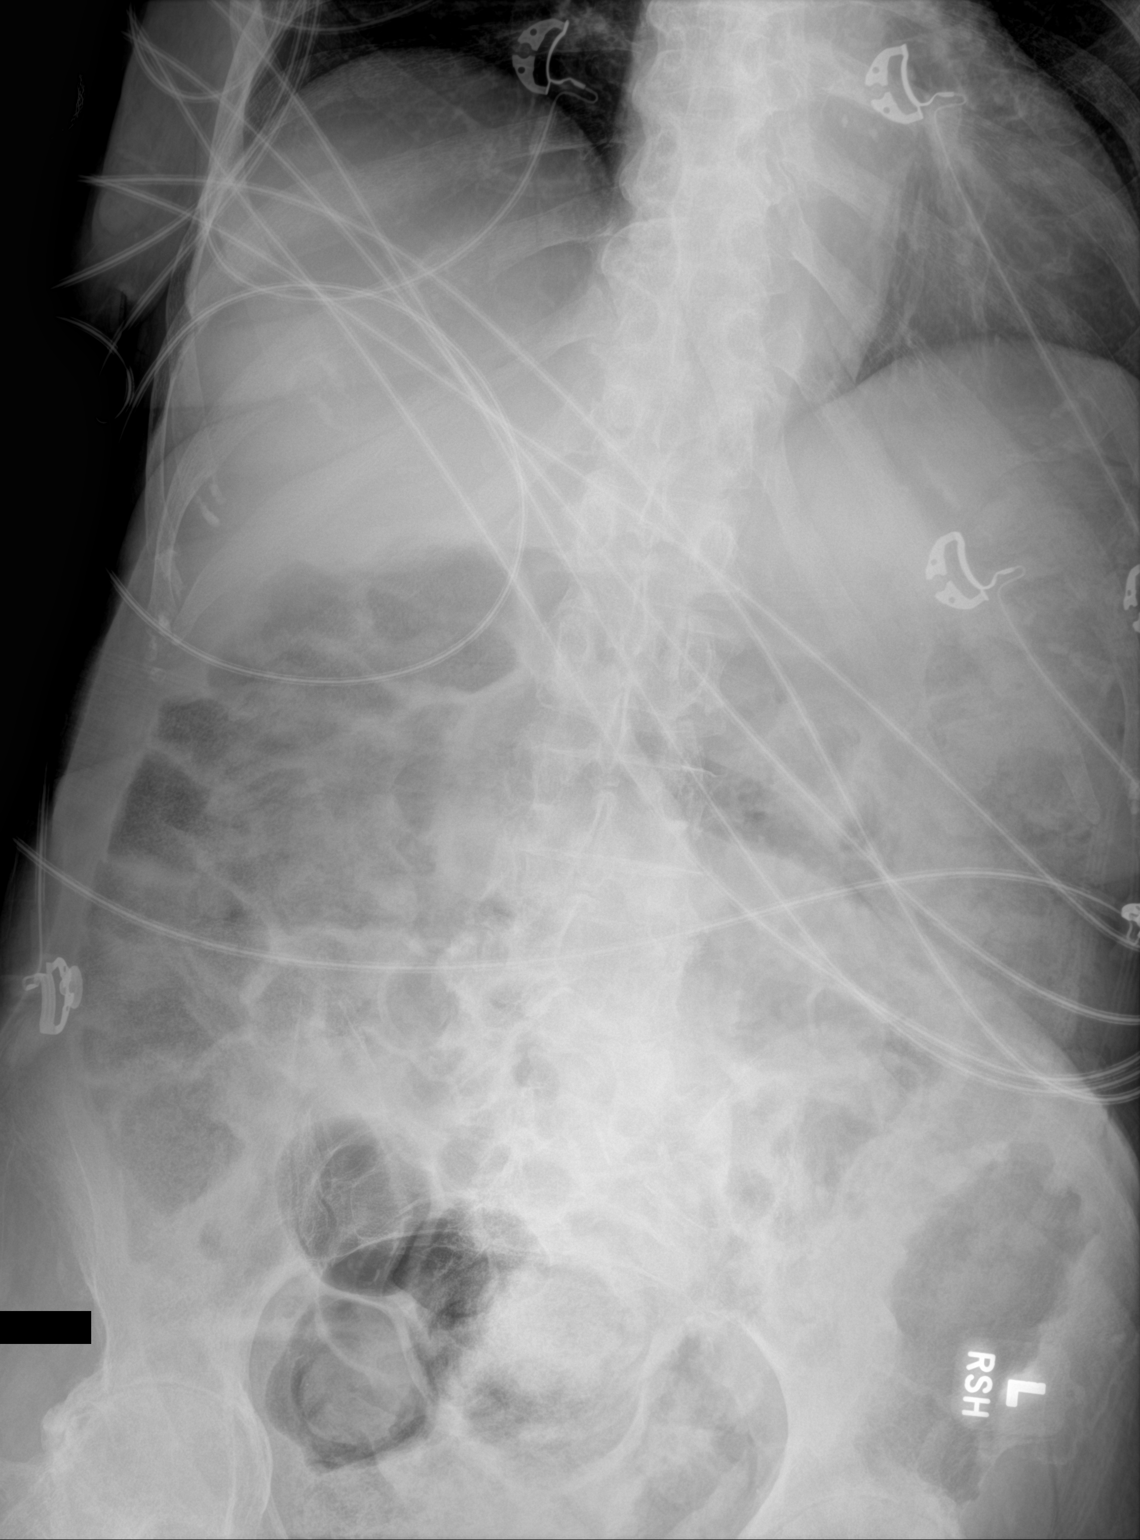

[1 of 1 positions shown; findings below may reference images not displayed]

FINDINGS: There is mild gaseous distention of multiple loops of bowel
throughout the abdomen without evidence of mechanical obstruction.
There is a moderate to large colonic stool burden. There is no
definite free intraperitoneal air, suboptimally evaluated given
supine technique.

There is no abnormal soft tissue calcification.

There is no acute osseous abnormality. The imaged lung bases are
clear.
IMPRESSION: Moderate-to-large stool burden without evidence of mechanical
obstruction.

## 2021-07-06 IMAGING — CT CT ABD-PELV W/O CM
2 of 7 series · 13 of 46 positions shown, 18 images · non-contrast
Comparison: Plain film of earlier today.  No correlate CT.

CLINICAL DATA: Sepsis.  Abdominal pain.

EXAM:
CT ABDOMEN AND PELVIS WITHOUT CONTRAST
TECHNIQUE: Multidetector CT imaging of the abdomen and pelvis was performed
following the standard protocol without IV contrast.

[Series 3: ap without · axial · non-contrast · 0.68mm/px · z∈[+1042,+1387]mm · 10 of 83 slices shown, 15 images]
[im 7/83  soft-tissue]
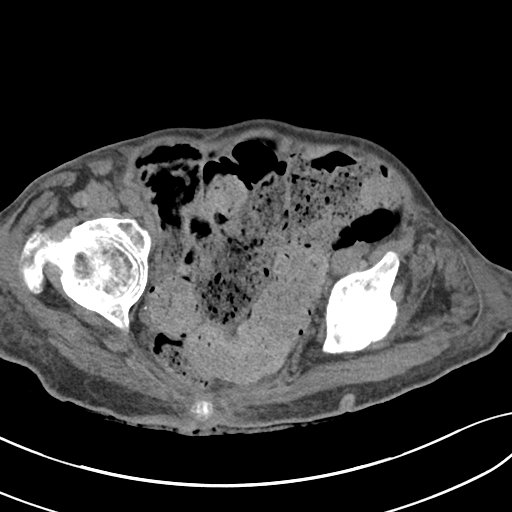
[im 7/83  bone]
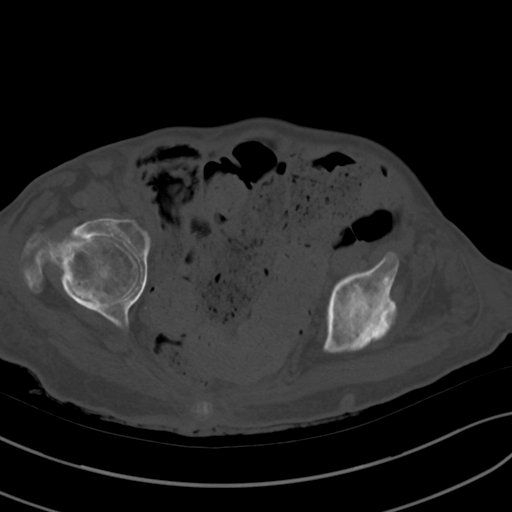
[im 19/83  soft-tissue]
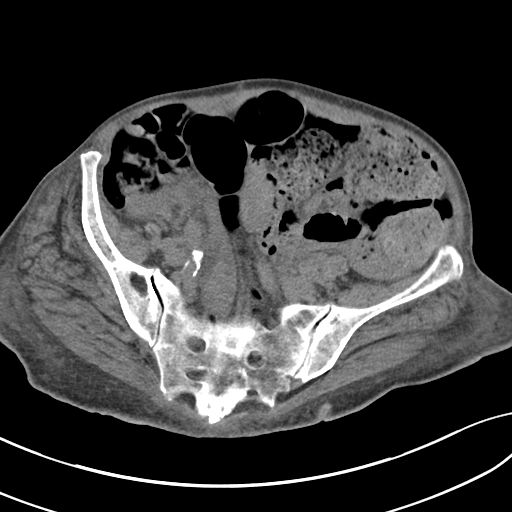
[im 26/83  soft-tissue]
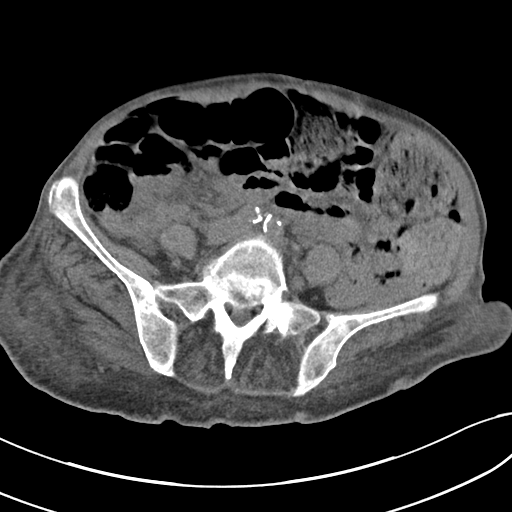
[im 32/83  soft-tissue]
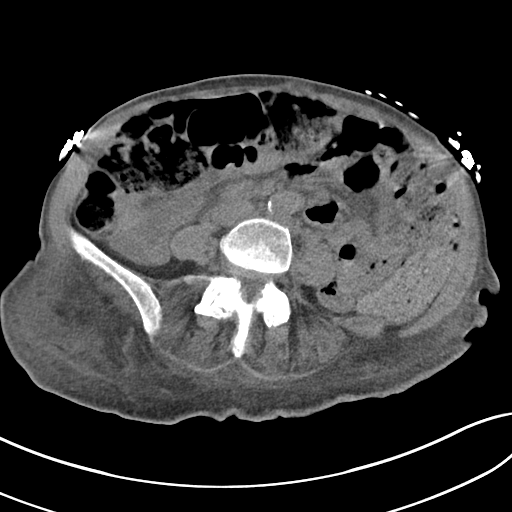
[im 45/83  soft-tissue]
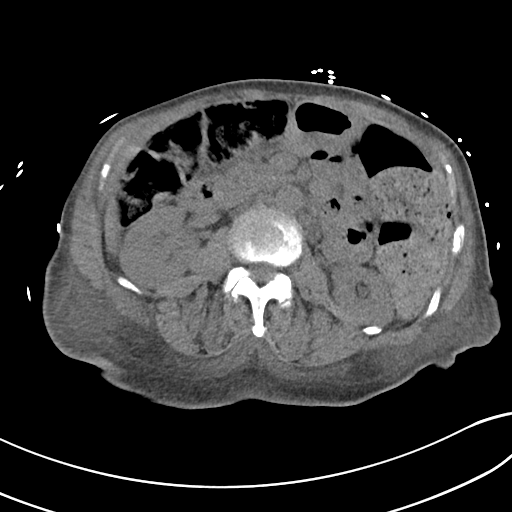
[im 51/83  soft-tissue]
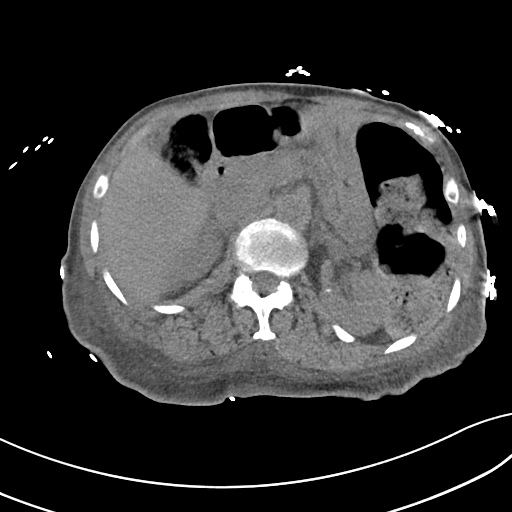
[im 57/83  soft-tissue]
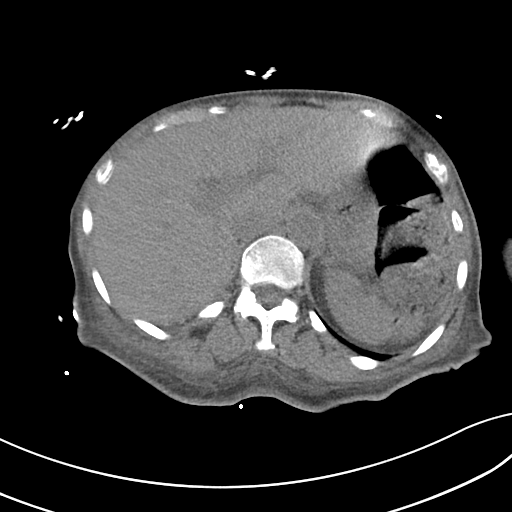
[im 57/83  lung]
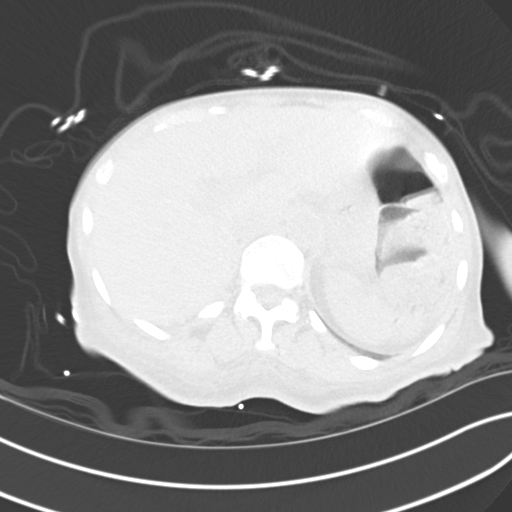
[im 64/83  lung]
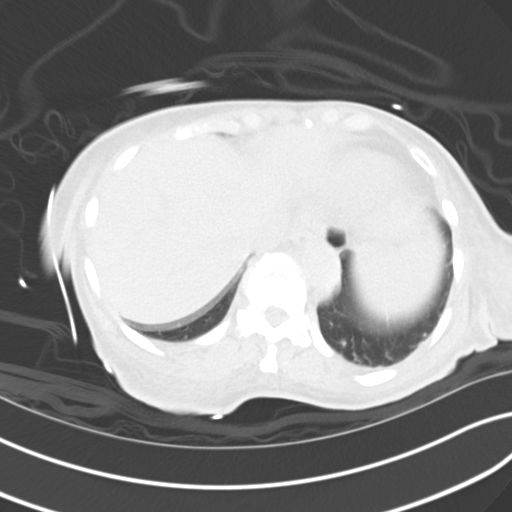
[im 70/83  soft-tissue]
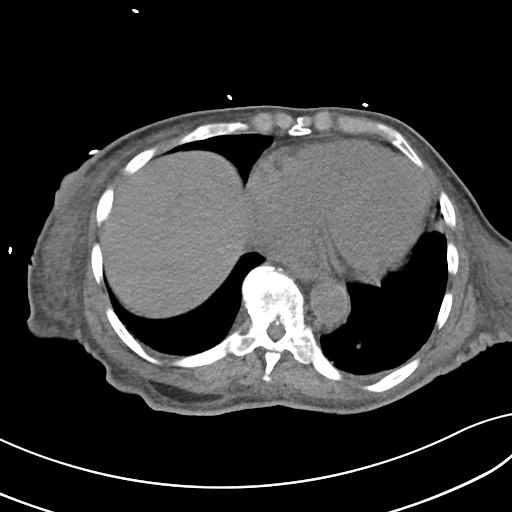
[im 70/83  lung]
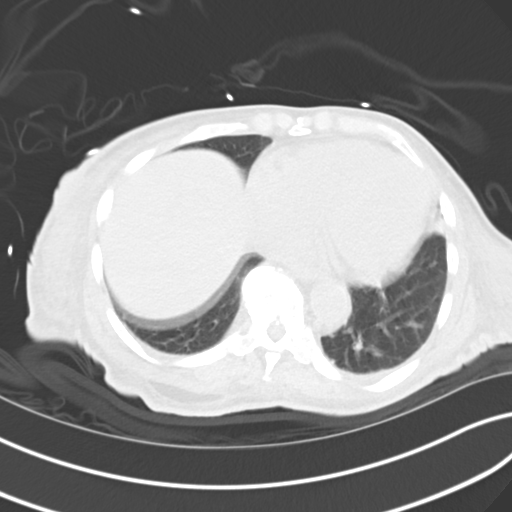
[im 76/83  soft-tissue]
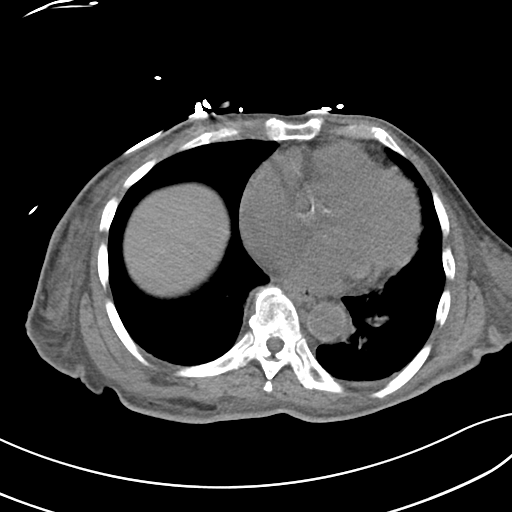
[im 76/83  lung]
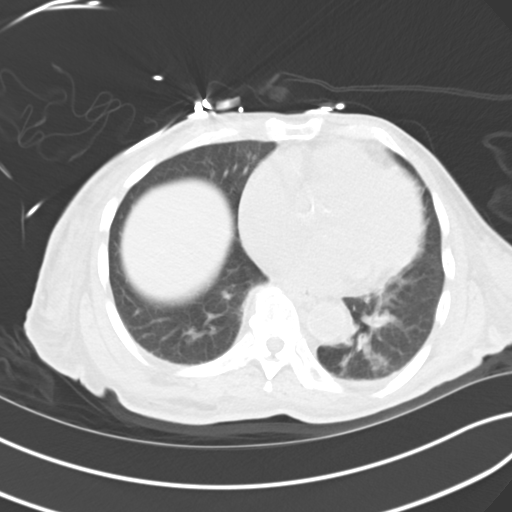
[im 76/83  bone]
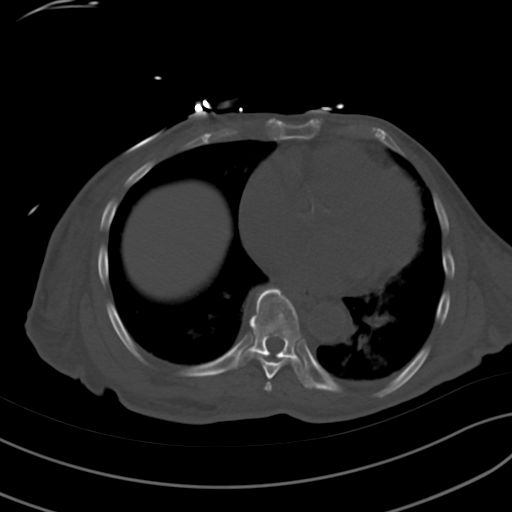

[Series 8: cor · coronal · 0.83mm/px · 3 of 88 slices shown]
[im 22/88  soft-tissue]
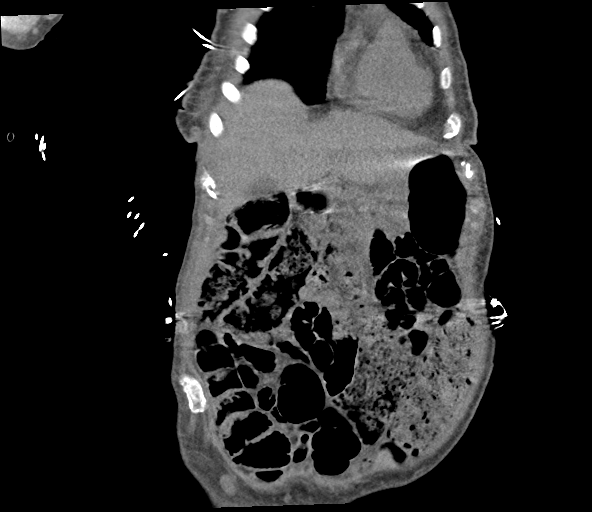
[im 44/88  soft-tissue]
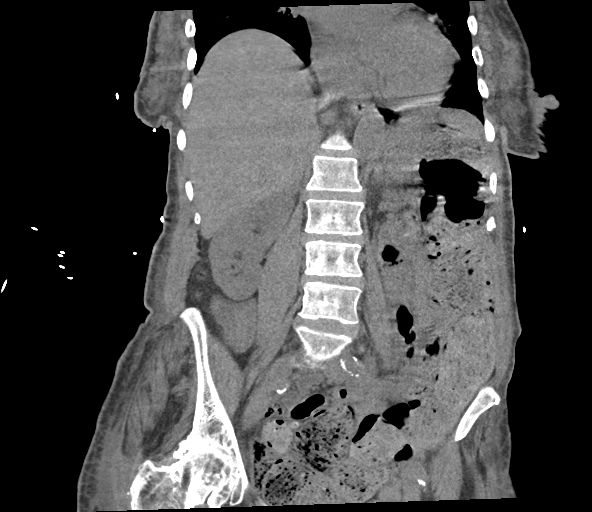
[im 66/88  soft-tissue]
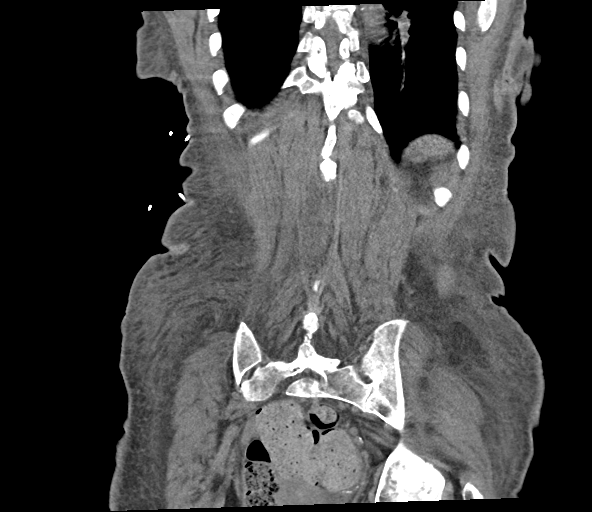

[13 of 46 positions shown; findings below may reference images not displayed]

FINDINGS: Lower chest: Motion degradation. Left base atelectasis. Mild
cardiomegaly with small hiatal hernia. Lad coronary artery
calcification.

Hepatobiliary: Multifactorial degradation, including lack of oral/IV
contrast, minimal motion, overlying wires and leads. No gross
abnormality within the liver, gallbladder, biliary tract.

Pancreas: Grossly within normal limits.

Spleen: Normal in size, without focal abnormality.

Adrenals/Urinary Tract: Normal adrenal glands. Favor dystrophic
calcification over punctate left renal collecting system calculus.
No gross hydronephrosis. No bladder calculi.

Stomach/Bowel: Normal remainder of the stomach. Large colonic stool
burden throughout. Terminal ileum and appendix not identified.

Normal small bowel caliber.

Vascular/Lymphatic: Aortic atherosclerosis. Mildly prominent
bilateral inguinal nodes are likely reactive. No abdominal
adenopathy.

Reproductive: Hysterectomy.  No adnexal mass.

Other: Pelvic floor laxity. No significant free fluid. No free
intraperitoneal air.

Musculoskeletal: Advanced bilateral degenerative changes of both
hips. Degenerate disc disease involves the lumbosacral junction.
Right hemidiaphragm elevation.
IMPRESSION: 1. Moderate to markedly degraded exam, secondary to technique and
patient related factors detailed above.
2. Large colonic stool burden, most consistent with significant
constipation. No other explanation for acute pain.
3. Coronary artery atherosclerosis. Aortic Atherosclerosis
([4H]-[4H]).
4. Small hiatal hernia.

## 2021-07-06 MED ORDER — ACETAMINOPHEN 160 MG/5ML PO SOLN
500.0000 mg | Freq: Once | ORAL | Status: AC
  Administered 2021-07-06: 16:00:00 500 mg via ORAL
  Filled 2021-07-06: qty 20.3

## 2021-07-06 MED ORDER — ENOXAPARIN SODIUM 30 MG/0.3ML IJ SOSY
30.0000 mg | PREFILLED_SYRINGE | INTRAMUSCULAR | Status: DC
  Administered 2021-07-06 – 2021-07-07 (×2): 30 mg via SUBCUTANEOUS
  Filled 2021-07-06 (×2): qty 0.3

## 2021-07-06 MED ORDER — LACTATED RINGERS IV BOLUS (SEPSIS)
1000.0000 mL | Freq: Once | INTRAVENOUS | Status: AC
  Administered 2021-07-06: 12:00:00 1000 mL via INTRAVENOUS

## 2021-07-06 MED ORDER — ENOXAPARIN SODIUM 40 MG/0.4ML IJ SOSY: 40.0000 mg | PREFILLED_SYRINGE | INTRAMUSCULAR | Status: DC

## 2021-07-06 MED ORDER — ACETAMINOPHEN 650 MG RE SUPP: 650.0000 mg | Freq: Four times a day (QID) | RECTAL | Status: DC | PRN

## 2021-07-06 MED ORDER — GLUCERNA SHAKE PO LIQD
237.0000 mL | Freq: Two times a day (BID) | ORAL | Status: DC
  Administered 2021-07-07 – 2021-07-11 (×9): 237 mL via ORAL

## 2021-07-06 MED ORDER — BISACODYL 5 MG PO TBEC: 5.0000 mg | DELAYED_RELEASE_TABLET | Freq: Every day | ORAL | Status: DC | PRN

## 2021-07-06 MED ORDER — POLYETHYLENE GLYCOL 3350 17 G PO PACK: 17.0000 g | PACK | Freq: Every day | ORAL | Status: DC

## 2021-07-06 MED ORDER — PROSOURCE PLUS PO LIQD
30.0000 mL | Freq: Two times a day (BID) | ORAL | Status: DC
  Administered 2021-07-06 – 2021-07-11 (×6): 30 mL via ORAL
  Filled 2021-07-06 (×7): qty 30

## 2021-07-06 MED ORDER — PRO-STAT SUGAR FREE PO LIQD
30.0000 mL | Freq: Two times a day (BID) | ORAL | Status: DC
  Filled 2021-07-06 (×2): qty 30

## 2021-07-06 MED ORDER — LACTATED RINGERS IV SOLN: INTRAVENOUS | Status: DC

## 2021-07-06 MED ORDER — VANCOMYCIN HCL 1500 MG/300ML IV SOLN
1500.0000 mg | Freq: Once | INTRAVENOUS | Status: AC
  Administered 2021-07-06: 12:00:00 1500 mg via INTRAVENOUS
  Filled 2021-07-06: qty 300

## 2021-07-06 MED ORDER — MELATONIN 3 MG PO TABS
3.0000 mg | ORAL_TABLET | Freq: Every day | ORAL | Status: DC
  Administered 2021-07-06 – 2021-07-10 (×5): 3 mg via ORAL
  Filled 2021-07-06 (×5): qty 1

## 2021-07-06 MED ORDER — SODIUM CHLORIDE 0.9% FLUSH
3.0000 mL | Freq: Two times a day (BID) | INTRAVENOUS | Status: DC
  Administered 2021-07-06 – 2021-07-11 (×9): 3 mL via INTRAVENOUS

## 2021-07-06 MED ORDER — INSULIN ASPART 100 UNIT/ML IJ SOLN
0.0000 [IU] | Freq: Three times a day (TID) | INTRAMUSCULAR | Status: DC
  Administered 2021-07-07: 14:00:00 3 [IU] via SUBCUTANEOUS
  Administered 2021-07-07: 09:00:00 2 [IU] via SUBCUTANEOUS
  Administered 2021-07-07 – 2021-07-10 (×7): 3 [IU] via SUBCUTANEOUS
  Administered 2021-07-11: 08:00:00 2 [IU] via SUBCUTANEOUS

## 2021-07-06 MED ORDER — SENNA 8.6 MG PO TABS: 1.0000 | ORAL_TABLET | Freq: Every day | ORAL | Status: DC

## 2021-07-06 MED ORDER — OXYCODONE HCL 5 MG PO TABS: 5.0000 mg | ORAL_TABLET | ORAL | Status: DC | PRN

## 2021-07-06 MED ORDER — DOCUSATE SODIUM 100 MG PO CAPS
100.0000 mg | ORAL_CAPSULE | Freq: Every day | ORAL | Status: DC
  Administered 2021-07-07 – 2021-07-11 (×5): 100 mg via ORAL
  Filled 2021-07-06 (×5): qty 1

## 2021-07-06 MED ORDER — ASPIRIN EC 81 MG PO TBEC
81.0000 mg | DELAYED_RELEASE_TABLET | Freq: Every day | ORAL | Status: DC
  Administered 2021-07-07 – 2021-07-11 (×5): 81 mg via ORAL
  Filled 2021-07-06 (×5): qty 1

## 2021-07-06 MED ORDER — SENNOSIDES-DOCUSATE SODIUM 8.6-50 MG PO TABS
2.0000 | ORAL_TABLET | Freq: Every day | ORAL | Status: DC
  Administered 2021-07-06: 22:00:00 2 via ORAL
  Filled 2021-07-06: qty 2

## 2021-07-06 MED ORDER — ONDANSETRON HCL 4 MG PO TABS: 4.0000 mg | ORAL_TABLET | Freq: Four times a day (QID) | ORAL | Status: DC | PRN

## 2021-07-06 MED ORDER — MORPHINE SULFATE (PF) 2 MG/ML IV SOLN: 2.0000 mg | INTRAVENOUS | Status: DC | PRN

## 2021-07-06 MED ORDER — HYDRALAZINE HCL 20 MG/ML IJ SOLN: 5.0000 mg | INTRAMUSCULAR | Status: DC | PRN

## 2021-07-06 MED ORDER — SODIUM CHLORIDE 0.9 % IV SOLN
2.0000 g | INTRAVENOUS | Status: DC
  Administered 2021-07-06 – 2021-07-07 (×2): 2 g via INTRAVENOUS
  Filled 2021-07-06 (×2): qty 2

## 2021-07-06 MED ORDER — LACTATED RINGERS IV BOLUS (SEPSIS)
250.0000 mL | Freq: Once | INTRAVENOUS | Status: AC
  Administered 2021-07-06: 12:00:00 250 mL via INTRAVENOUS

## 2021-07-06 MED ORDER — SENNA-DOCUSATE SODIUM 8.6-50 MG PO TABS: 2.0000 | ORAL_TABLET | Freq: Every day | ORAL | Status: DC

## 2021-07-06 MED ORDER — SODIUM CHLORIDE 0.9 % IV SOLN
2.0000 g | Freq: Once | INTRAVENOUS | Status: AC
  Administered 2021-07-06: 12:00:00 2 g via INTRAVENOUS
  Filled 2021-07-06: qty 20

## 2021-07-06 MED ORDER — ONDANSETRON HCL 4 MG/2ML IJ SOLN: 4.0000 mg | Freq: Four times a day (QID) | INTRAMUSCULAR | Status: DC | PRN

## 2021-07-06 MED ORDER — HYDRALAZINE HCL 25 MG PO TABS
25.0000 mg | ORAL_TABLET | Freq: Three times a day (TID) | ORAL | Status: DC
  Administered 2021-07-07 – 2021-07-11 (×13): 25 mg via ORAL
  Filled 2021-07-06 (×14): qty 1

## 2021-07-06 MED ORDER — ACETAMINOPHEN 500 MG PO TABS
1000.0000 mg | ORAL_TABLET | Freq: Two times a day (BID) | ORAL | Status: DC
  Administered 2021-07-06 – 2021-07-11 (×10): 1000 mg via ORAL
  Filled 2021-07-06 (×10): qty 2

## 2021-07-06 MED ORDER — DILTIAZEM HCL ER COATED BEADS 180 MG PO CP24
180.0000 mg | ORAL_CAPSULE | Freq: Every day | ORAL | Status: DC
  Administered 2021-07-07: 11:00:00 180 mg via ORAL
  Filled 2021-07-06: qty 1

## 2021-07-06 MED ORDER — VANCOMYCIN HCL 1250 MG/250ML IV SOLN: 1250.0000 mg | INTRAVENOUS | Status: DC

## 2021-07-06 MED ORDER — ACETAMINOPHEN 325 MG PO TABS: 650.0000 mg | ORAL_TABLET | Freq: Four times a day (QID) | ORAL | Status: DC | PRN

## 2021-07-06 NOTE — Assessment & Plan Note (Signed)
-  Consider outpatient f/u

## 2021-07-06 NOTE — ED Notes (Signed)
Patient transported to CT 

## 2021-07-06 NOTE — Progress Notes (Signed)
Elink following code sepsis °

## 2021-07-06 NOTE — Assessment & Plan Note (Signed)
-  Sepsis indicates life-threatening organ dysfunction with mortality >10%, caused by dysregulation to host response.   -SIRS criteria in this patient includes: Leukocytosis, tachycardia, tachypnea -Patient has evidence of acute organ failure with elevated lactate >2 that is not easily explained by another condition. -While awaiting blood cultures, this appears to be a preseptic condition. -Sepsis protocol initiated -Suspected source is cellulitis - although UTI also appears to be present. -Blood and urine cultures pending -Will admit due to multiple acute medical issues -Treat with IVCefepime/Vanc for cellulitis/UTI -Lactate has cleared

## 2021-07-06 NOTE — Assessment & Plan Note (Signed)
-  Variable, but appears to have low stage 3a/high stage 3b CKD -Appears to be slightly worse than baseline -Will trend with hydration

## 2021-07-06 NOTE — ED Triage Notes (Signed)
From [facility] for redness/adema left lower leg with concern for DVT and questionable infection stage 4 wounds right knee/hip. Temp 103.

## 2021-07-06 NOTE — Assessment & Plan Note (Signed)
-  UA is consistent with UTI -Patient is unable to provide effective history -While this could be the source of her sepsis, cellulitis is more likely -This should be adequately treated with sepsis abx

## 2021-07-06 NOTE — Assessment & Plan Note (Signed)
-  she does not appear to be taking medications for this issue

## 2021-07-06 NOTE — Assessment & Plan Note (Signed)
-  Large apparent condylomatum -No treatment indicated at this time

## 2021-07-06 NOTE — Progress Notes (Signed)
Penny [name] is a 86 y.o. female patient admitted. Awake, alert - oriented  X 1 (person)  - no acute distress noted.  VSS - Blood pressure (!) 121/56, pulse 66, temperature 98.6 F (37 C), temperature source Oral, resp. rate 18, weight 73.9 kg, SpO2 97 %.    IV in place, occlusive dsg intact without redness. Generalized redness to LLE , anterior abdomen and right flank. R buttocks and R lateral knee with dated foam dressings. Orientation to room, and floor completed.  Admission INP armband ID verified with patient/family, and in place.   SR up x2. Call light within reach.  Will cont to eval and treat per MD orders.  Minna Antis, RN 07/06/2021 11:43 PM

## 2021-07-06 NOTE — ED Notes (Signed)
ED Provider at bedside. 

## 2021-07-06 NOTE — ED Provider Notes (Addendum)
[facility] EMERGENCY DEPARTMENT Provider Note   CSN: [id number] Arrival date & time: 07/06/21  1030     History Chief Complaint  Patient presents with   Fever    Penny [name] is a 86 y.o. female.  Patient with history of CKD, diabetes, and dementia presents today from nursing facility with fever. Patient with bilateral lower extremity redness and edema, has wounds on right knee and right hip which were incised and drained in October with wound vac placement. Started on vancomycin and cefepime and transitioned to Augmentin for 10 days when cultures grew MSSA. Has been following by wound care outpatient for management of her wounds. Sent from her facility for worsening bilateral lower extremity edema and erythema.   Level 5 caveat --- dementia  The history is provided by the patient, the EMS personnel and the nursing home. No language interpreter was used.  Fever     Past Medical History:  Diagnosis Date   CKD (chronic kidney disease) stage 3, GFR 30-59 ml/min (HCC) 04/17/2021    Patient Active Problem List   Diagnosis Date Noted   Protein-calorie malnutrition, severe 05/11/2021   Acute prerenal azotemia 05/09/2021   Acute on chronic anemia 05/09/2021   Dementia ([place])    Pressure injury of right hip, stage 4 ([place])    Complicated open wound of right hip 05/08/2021   Malnutrition of moderate degree 53/97/6734   Acute metabolic encephalopathy 19/37/9024   Hypothermia due to exposure 04/17/2021   Pressure ulcer, shoulder blades, stage II ([place]) - right posterior shoulder 04/17/2021   Pressure ulcer, hip, right, unstageable ([place]) 04/17/2021   CKD (chronic kidney disease) stage 3, GFR 30-59 ml/min ([place]) 04/17/2021   Pain due to onychomycosis of toenails of both feet 02/01/2021   Type 2 diabetes mellitus with vascular disease ([place]) 02/01/2021    Past Surgical History:  Procedure Laterality Date   INCISION AND DRAINAGE HIP Right 05/11/2021   Procedure:  IRRIGATION AND DEBRIDEMENT HIP PLACEMENT OF WOUND VAC;  Surgeon: Tania Ade, MD;  Location: [facility]S;  Service: Orthopedics;  Laterality: Right;     OB History   No obstetric history on file.     No family history on file.  Social History   Tobacco Use   Smoking status: Never   Smokeless tobacco: Former    Types: Snuff, Sarina Ser    Quit date: 05/06/1956  Vaping Use   Vaping Use: Never used  Substance Use Topics   Alcohol use: No   Drug use: No    Home Medications Prior to Admission medications   Medication Sig Start Date End Date Taking? Authorizing Provider  acetaminophen (TYLENOL) 325 MG tablet Take 650 mg by mouth every 6 (six) hours as needed for mild pain.    [provider]  acetaminophen (TYLENOL) 650 MG suppository Place 650 mg rectally every 6 (six) hours as needed for mild pain.    [provider]  Amino Acids-Protein Hydrolys (FEEDING SUPPLEMENT, PRO-STAT SUGAR FREE 64,) LIQD Take 30 mLs by mouth 2 (two) times daily.    [provider]  aspirin 81 MG tablet Take 81 mg by mouth daily.    [provider]  diltiazem (CARDIZEM CD) 180 MG 24 hr capsule Take 1 capsule (180 mg total) by mouth daily. 05/17/21   Mariel Aloe, MD  feeding supplement, GLUCERNA SHAKE, (GLUCERNA SHAKE) LIQD Take 237 mLs by mouth 2 (two) times daily between meals.    [provider]  hydrALAZINE (APRESOLINE)  25 MG tablet Take 1 tablet (25 mg total) by mouth every 8 (eight) hours. 04/21/21   Hosie Poisson, MD  insulin lispro (HUMALOG) 100 UNIT/ML KwikPen Inject 2-10 Units into the skin 4 (four) times daily -  before meals and at bedtime. Sliding scale:  if CBG is 0 - 150 = 0 units, 151-199 = 2 units, 200-249 = 4 units, 250-299 = 6 units, 300-349 = 8 units, 350-399 = 10 units, 400 - Call MD    [provider]  melatonin 3 MG TABS tablet Take 3 mg by mouth at bedtime.    [provider]  metFORMIN (GLUCOPHAGE) 500 MG tablet Take 500 mg by  mouth 2 (two) times daily with a meal. 10/01/15   [provider]  Multiple Vitamins-Minerals (MULTIVITAMIN ADULTS) TABS Take 1 tablet by mouth daily.    [provider]  ondansetron (ZOFRAN) 4 MG tablet Take 4 mg by mouth every 6 (six) hours as needed for nausea or vomiting.    [provider]  polyethylene glycol (MIRALAX / GLYCOLAX) 17 g packet Take 17 g by mouth daily. 05/17/21   Mariel Aloe, MD  senna (SENOKOT) 8.6 MG TABS tablet Take 1 tablet (8.6 mg total) by mouth 2 (two) times daily. 04/21/21   Hosie Poisson, MD    Allergies    Glipizide  Review of Systems   Review of Systems  Unable to perform ROS: Dementia  Constitutional:  Positive for fever.  Gastrointestinal:  Positive for abdominal pain.  All other systems reviewed and are negative.  Physical Exam Updated Vital Signs BP 123/60 (BP Location: Right Arm)    Pulse 73    Temp 99.3 F (37.4 C) (Rectal)    Resp (!) 24    LMP  (LMP Unknown)    SpO2 96%   Physical Exam Vitals and nursing note reviewed.  Constitutional:      Comments: Patient somewhat chronically ill appearing resting in bed in no acute distress  Eyes:     Extraocular Movements: Extraocular movements intact.     Conjunctiva/sclera: Conjunctivae normal.     Pupils: Pupils are equal, round, and reactive to light.  Cardiovascular:     Rate and Rhythm: Regular rhythm. Tachycardia present.     Heart sounds: Normal heart sounds.  Pulmonary:     Effort: Pulmonary effort is normal. No respiratory distress.     Breath sounds: Normal breath sounds.  Abdominal:     General: There is distension.     Tenderness: There is abdominal tenderness.     Comments: Diffusely distended and tender abdomen  Musculoskeletal:     Comments: Bilateral lower extremity erythema, edema, and warmth most significant to the left lower leg and the right upper leg.  Removed bandages for right knee and right hip which appear to be well healing without purulence  or other signs of infection  DP and PT intact and 2+ bilaterally  See images below for further  Skin:    General: Skin is warm.     Findings: Erythema present.  Neurological:     Mental Status: She is alert and oriented to person, place, and time. Mental status is at baseline.       ED Results / Procedures / Treatments   Labs (all labs ordered are listed, but only abnormal results are displayed) Labs Reviewed  LACTIC ACID, PLASMA - Abnormal; Notable for the following components:      Result Value   Lactic Acid, Venous 2.0 (*)  All other components within normal limits  COMPREHENSIVE METABOLIC PANEL - Abnormal; Notable for the following components:   Sodium 134 (*)    Glucose, Bld 245 (*)    BUN 85 (*)    Creatinine, Ser 1.69 (*)    Calcium 8.6 (*)    Albumin 1.8 (*)    GFR, Estimated 30 (*)    All other components within normal limits  CBC WITH DIFFERENTIAL/PLATELET - Abnormal; Notable for the following components:   WBC 18.5 (*)    RBC 2.87 (*)    Hemoglobin 8.9 (*)    HCT 28.1 (*)    RDW 15.8 (*)    Neutro Abs 17.8 (*)    Lymphs Abs 0.3 (*)    Abs Immature Granulocytes 0.16 (*)    All other components within normal limits  RESP PANEL BY RT-PCR (FLU A&B, COVID) ARPGX2  CULTURE, BLOOD (ROUTINE X 2)  CULTURE, BLOOD (ROUTINE X 2)  URINE CULTURE  LACTIC ACID, PLASMA  PROTIME-INR  APTT  URINALYSIS, ROUTINE W REFLEX MICROSCOPIC    EKG None  Radiology CT ABDOMEN PELVIS WO CONTRAST  Result Date: 07/06/2021 CLINICAL DATA:  Sepsis.  Abdominal pain. EXAM: CT ABDOMEN AND PELVIS WITHOUT CONTRAST TECHNIQUE: Multidetector CT imaging of the abdomen and pelvis was performed following the standard protocol without IV contrast. COMPARISON:  Plain film of earlier today.  No correlate CT. FINDINGS: Lower chest: Motion degradation. Left base atelectasis. Mild cardiomegaly with small hiatal hernia. Lad coronary artery calcification. Hepatobiliary: Multifactorial degradation,  including lack of oral/IV contrast, minimal motion, overlying wires and leads. No gross abnormality within the liver, gallbladder, biliary tract. Pancreas: Grossly within normal limits. Spleen: Normal in size, without focal abnormality. Adrenals/Urinary Tract: Normal adrenal glands. Favor dystrophic calcification over punctate left renal collecting system calculus. No gross hydronephrosis. No bladder calculi. Stomach/Bowel: Normal remainder of the stomach. Large colonic stool burden throughout. Terminal ileum and appendix not identified. Normal small bowel caliber. Vascular/Lymphatic: Aortic atherosclerosis. Mildly prominent bilateral inguinal nodes are likely reactive. No abdominal adenopathy. Reproductive: Hysterectomy.  No adnexal mass. Other: Pelvic floor laxity. No significant free fluid. No free intraperitoneal air. Musculoskeletal: Advanced bilateral degenerative changes of both hips. Degenerate disc disease involves the lumbosacral junction. Right hemidiaphragm elevation. IMPRESSION: 1. Moderate to markedly degraded exam, secondary to technique and patient related factors detailed above. 2. Large colonic stool burden, most consistent with significant constipation. No other explanation for acute pain. 3. Coronary artery atherosclerosis. Aortic Atherosclerosis (ICD10-I70.0). 4. Small hiatal hernia. Electronically Signed   By: Abigail Miyamoto M.D.   On: 07/06/2021 13:45   DG Chest Port 1 View  Result Date: 07/06/2021 CLINICAL DATA:  Fever, cough EXAM: PORTABLE CHEST 1 VIEW COMPARISON:  Chest radiograph 04/17/2021 FINDINGS: Heart is at the upper limits of normal for size. The mediastinal contours are within normal limits. There is unchanged asymmetric elevation of the right hemidiaphragm. There is no focal consolidation or pulmonary edema. There is no pleural effusion or pneumothorax. There is a 1.0 cm nodular opacity in the left apex. There is no acute osseous abnormality. IMPRESSION: 1. No radiographic  evidence of acute cardiopulmonary process. 2. 1.0 cm nodule in the left apex. Recommend nonemergent CT chest for further evaluation. Electronically Signed   By: Valetta Mole M.D.   On: 07/06/2021 11:32   DG Abd Portable 1 View  Result Date: 07/06/2021 CLINICAL DATA:  Fever, cough EXAM: PORTABLE ABDOMEN - 1 VIEW COMPARISON:  KUB 05/11/2021 FINDINGS: There is mild gaseous distention of multiple loops  of bowel throughout the abdomen without evidence of mechanical obstruction. There is a moderate to large colonic stool burden. There is no definite free intraperitoneal air, suboptimally evaluated given supine technique. There is no abnormal soft tissue calcification. There is no acute osseous abnormality. The imaged lung bases are clear. IMPRESSION: Moderate-to-large stool burden without evidence of mechanical obstruction. Electronically Signed   By: Valetta Mole M.D.   On: 07/06/2021 11:24    Procedures .Critical Care Performed by: Bud Face, PA-C Authorized by: Bud Face, PA-C   Critical care provider statement:    Critical care time (minutes):  35   Critical care start time:  07/06/2021 11:30 AM   Critical care end time:  07/06/2021 12:05 PM   Critical care time was exclusive of:  Separately billable procedures and treating other patients   Critical care was necessary to treat or prevent imminent or life-threatening deterioration of the following conditions:  Sepsis   Critical care was time spent personally by me on the following activities:  Development of treatment plan with patient or surrogate, discussions with consultants, evaluation of patient's response to treatment, examination of patient, obtaining history from patient or surrogate, ordering and review of laboratory studies, ordering and review of radiographic studies, pulse oximetry, re-evaluation of patient's condition and review of old charts   I assumed direction of critical care for this patient from another provider in my  specialty: no     Care discussed with: admitting provider     Medications Ordered in ED Medications  lactated ringers infusion (has no administration in time range)  vancomycin (VANCOCIN) IVPB 1000 mg/200 mL premix (has no administration in time range)  cefTRIAXone (ROCEPHIN) 2 g in sodium chloride 0.9 % 100 mL IVPB (has no administration in time range)  lactated ringers bolus 1,000 mL (has no administration in time range)    And  lactated ringers bolus 1,000 mL (has no administration in time range)    And  lactated ringers bolus 250 mL (has no administration in time range)    ED Course  I have reviewed the triage vital signs and the nursing notes.  Pertinent labs & imaging results that were available during my care of the patient were reviewed by me and considered in my medical decision making (see chart for details).    MDM Rules/Calculators/A&P                         Patient presents today with sepsis presumably from bilateral lower extremity cellulitis.  He is followed by wound care for wounds on her right leg.  These wounds do not appear infected at this time.  Oral temp 103, she is tachycardic and tachypneic with leukocytosis at 18, code sepsis called and initiation of fluids with vancomycin and Rocephin.  Of note, patient also with distended abdomen.  CT without contrast reveals large stool burden without obstruction.  Attempted rectal exam for potential disimpaction, no stool present in rectal vault.  Will likely need enema at some point.  After fluids and antipyretics, patient's heart rate and temperature has normalized.  Patient with anemia unchanged from baseline. AKI with creatinine up from 1.09 --> 1.69 1 month ago. UA pending. Blood cultures drawn prior to abx. Lactic 2.0. CXR without acute changes, notable for 1 cm nodule likely incidental finding.  Will seek admission for sepsis, patient educated to same and amenable with plan. Discussed with hospitalist who agrees to  admit.   This  is a shared visit with supervising physician Dr. Johnney Killian who has independently evaluated patient & provided guidance in evaluation/management/disposition, in agreement with care     Final Clinical Impression(s) / ED Diagnoses Final diagnoses:  Sepsis, due to unspecified organism, unspecified whether acute organ dysfunction present [facility])  Acute kidney injury [facility])    Rx / [place] Orders ED Discharge Orders     None        Nestor Lewandowsky 07/06/21 1543    Dorie Ohms, Leary Roca, PA-C 07/06/21 1546    Charlesetta Shanks, MD 07/11/21 [phone]

## 2021-07-06 NOTE — Progress Notes (Signed)
Pharmacy Antibiotic Note  Penny [name] is a 86 y.o. female admitted on 07/06/2021 presenting with cellulitis.  Pharmacy was initially consulted for vancomycin dosing. Now adding maintenance Cefepime therapy   WBC and SCr elevated. CrCl ~ 28 mL/min  Plan: -Start Cefepime 2 gm IV Q 24 hours -Vancomycin 1500 mg IV x 1, then 1250 mg IV q 48 hours (eAUC 519, Goal AUC 400-550, SCr 1.69) -Monitor renal function, Cx and clinical progression to narrow -Vancomycin levels as needed     Temp (24hrs), Avg:99 F (37.2 C), Min:98.7 F (37.1 C), Max:99.3 F (37.4 C)  Recent Labs  Lab 07/06/21 1053 07/06/21 1102 07/06/21 1425  WBC 18.5*  --   --   CREATININE 1.69*  --   --   LATICACIDVEN  --  2.0* 1.6     CrCl cannot be calculated (Unknown ideal weight.).    Allergies  Allergen Reactions   Glipizide Rash   Cefepime 12/22 >> Vancomycin 12/22 >>   12/22 BCx >> 12/22 UCx >>   Albertina Parr, PharmD., BCPS, BCCCP Clinical Pharmacist Please refer to [facility] for unit-specific pharmacist

## 2021-07-06 NOTE — Progress Notes (Signed)
BLE venous duplex has been completed.  Results can be found under chart review under CV PROC. 07/06/2021 6:54 PM Asyria Kolander RVT, RDMS

## 2021-07-06 NOTE — Progress Notes (Signed)
Pharmacy Antibiotic Note  Penny [name] is a 86 y.o. female admitted on 07/06/2021 presenting with cellulitis.  Pharmacy has been consulted for vancomycin dosing.  Plan: Vancomycin 1500 mg IV x 1, then 1250 mg IV q 48 hours (eAUC 519, Goal AUC 400-550, SCr 1.69) Monitor renal function, Cx and clinical progression to narrow Vancomycin levels as needed     Temp (24hrs), Avg:99.3 F (37.4 C), Min:99.3 F (37.4 C), Max:99.3 F (37.4 C)  Recent Labs  Lab 07/06/21 1053 07/06/21 1102  WBC 18.5*  --   CREATININE 1.69*  --   LATICACIDVEN  --  2.0*    CrCl cannot be calculated (Unknown ideal weight.).    Allergies  Allergen Reactions   Glipizide Rash    Bertis Ruddy, PharmD Clinical Pharmacist ED Pharmacist Phone # [phone] 07/06/2021 12:12 PM

## 2021-07-06 NOTE — H&P (Signed)
History and Physical    Patient: Penny [name] JEH:[id number] DOB: 05-09-1935 DOA: 07/06/2021 DOS: the patient was seen and examined on 07/06/2021 PCP: Kelton Pillar, MD  Patient coming from: SNF; NOK: Niece, Penny [name], [phone]  Chief Complaint: Fever  HPI: Penny [name] is a 86 y.o. female with medical history significant of stage 3 CKD; DM; dementia; and chronic R hip wound presenting with fever.  She has dementia and is minimally able to provide history (oriented to person and place).  She reports that she lives in an apartment (not true) and was sent in by her niece because she was having headaches (also not true).  She denies fever (also not true).  She has had chronic R hip/leg wounds that she reports have been stable.  She denied leg pain but had significant discomfort on exam.    ED Course:   Sepsis, ?cellulitis as cause.  Admitted in October for the same, s/p I&D.  Has BLE edema, erythema, warmth.  T103.  Giving Vanc, Rocephin.  Tachycardia improved, hemodynamically stable.  Lactate 3.  Constipation.     Review of Systems: ROS unable to be effectively performed  Past Medical History:  Diagnosis Date   CKD (chronic kidney disease) stage 3, GFR 30-59 ml/min ([place]) 04/17/2021   Past Surgical History:  Procedure Laterality Date   INCISION AND DRAINAGE HIP Right 05/11/2021   Procedure: IRRIGATION AND DEBRIDEMENT HIP PLACEMENT OF WOUND VAC;  Surgeon: Tania Ade, MD;  Location: [facility];  Service: Orthopedics;  Laterality: Right;   Social History:  reports that she has never smoked. She quit smokeless tobacco use about 65 years ago.  Her smokeless tobacco use included snuff and chew. She reports that she does not drink alcohol and does not use drugs.  Allergies  Allergen Reactions   Glipizide Rash    No family history on file.  Prior to Admission medications   Medication Sig Start Date End Date Taking? Authorizing Provider  acetaminophen (TYLENOL) 325 MG tablet  Take 650 mg by mouth every 6 (six) hours as needed for mild pain.   Yes [provider]  acetaminophen (TYLENOL) 500 MG tablet Take 1,000 mg by mouth every 12 (twelve) hours.   Yes [provider]  acetaminophen (TYLENOL) 650 MG suppository Place 650 mg rectally every 6 (six) hours as needed (general discomfort).   Yes [provider]  Amino Acids-Protein Hydrolys (FEEDING SUPPLEMENT, PRO-STAT SUGAR FREE 64,) LIQD Take 30 mLs by mouth 2 (two) times daily.   Yes [provider]  aspirin 81 MG tablet Take 81 mg by mouth daily.   Yes [provider]  cetirizine (ZYRTEC) 10 MG tablet Take 10 mg by mouth daily as needed (itching).   Yes [provider]  diltiazem (CARDIZEM CD) 180 MG 24 hr capsule Take 1 capsule (180 mg total) by mouth daily. 05/17/21  Yes Mariel Aloe, MD  feeding supplement, GLUCERNA SHAKE, (GLUCERNA SHAKE) LIQD Take 237 mLs by mouth 2 (two) times daily between meals.   Yes [provider]  hydrALAZINE (APRESOLINE) 25 MG tablet Take 1 tablet (25 mg total) by mouth every 8 (eight) hours. 04/21/21  Yes Hosie Poisson, MD  magnesium hydroxide (MILK OF MAGNESIA) 400 MG/5ML suspension Take 30 mLs by mouth every 12 (twelve) hours as needed for mild constipation.   Yes [provider]  melatonin 3 MG TABS tablet Take 3 mg by mouth at bedtime.   Yes [provider]  metFORMIN (GLUCOPHAGE) 500 MG  tablet Take 500 mg by mouth 2 (two) times daily with a meal. 10/01/15  Yes [provider]  Multiple Vitamins-Minerals (MULTIVITAMIN ADULTS) TABS Take 1 tablet by mouth daily.   Yes [provider]  ondansetron (ZOFRAN) 4 MG tablet Take 4 mg by mouth every 6 (six) hours as needed for nausea or vomiting.   Yes [provider]  polyethylene glycol (MIRALAX / GLYCOLAX) 17 g packet Take 17 g by mouth daily. 05/17/21  Yes Mariel Aloe, MD  senna (SENOKOT) 8.6 MG TABS tablet Take 1 tablet (8.6 mg total)  by mouth 2 (two) times daily. 04/21/21  Yes Hosie Poisson, MD  sennosides-docusate sodium (SENOKOT-S) 8.6-50 MG tablet Take 2 tablets by mouth at bedtime.   Yes [provider]    Physical Exam: Vitals:   07/06/21 1444 07/06/21 1530 07/06/21 1630 07/06/21 1730  BP:  127/60 (!) 118/56 (!) 114/56  Pulse:  60 (!) 59 (!) 59  Resp:  (!) 26 19 19   Temp: 99 F (37.2 C)  98.7 F (37.1 C)   TempSrc: Oral  Oral   SpO2:  95% 95% 94%   General:  Appears calm and comfortable and is in NAD Eyes:  PERRL, EOMI, normal lids, iris ENT:  grossly normal hearing, lips & tongue, mmm Neck:  no LAD, masses or thyromegaly Cardiovascular:  RRR, no m/r/g.  Respiratory:   CTA bilaterally with no wheezes/rales/rhonchi.  Normal to mildly increased respiratory effort. Abdomen:  tense, distended Skin:  chronic wounds appear to be C/D/I; however, marked LLE edema and erythema with erythema that extends across her lower abdomen/pelvis and onto R upper thigh.  Also has a large apparent condylomata on her L buttock that appears to currently be unrelated          Musculoskeletal:  decreased tone BUE/BLE, no bony abnormality, otherwise as above Lower extremity:   Limited foot exam with no ulcerations.  2+ distal pulses. Psychiatric:  grossly normal mood and affect, speech fluent and appropriate, AOx2, thinks it is 1977 or 1978 Neurologic:  CN 2-12 grossly intact, moves all extremities in coordinated fashion   Radiological Exams on Admission: Independently reviewed - see discussion in A/P where applicable  CT ABDOMEN PELVIS WO CONTRAST  Result Date: 07/06/2021 CLINICAL DATA:  Sepsis.  Abdominal pain. EXAM: CT ABDOMEN AND PELVIS WITHOUT CONTRAST TECHNIQUE: Multidetector CT imaging of the abdomen and pelvis was performed following the standard protocol without IV contrast. COMPARISON:  Plain film of earlier today.  No correlate CT. FINDINGS: Lower chest: Motion degradation. Left base atelectasis. Mild  cardiomegaly with small hiatal hernia. Lad coronary artery calcification. Hepatobiliary: Multifactorial degradation, including lack of oral/IV contrast, minimal motion, overlying wires and leads. No gross abnormality within the liver, gallbladder, biliary tract. Pancreas: Grossly within normal limits. Spleen: Normal in size, without focal abnormality. Adrenals/Urinary Tract: Normal adrenal glands. Favor dystrophic calcification over punctate left renal collecting system calculus. No gross hydronephrosis. No bladder calculi. Stomach/Bowel: Normal remainder of the stomach. Large colonic stool burden throughout. Terminal ileum and appendix not identified. Normal small bowel caliber. Vascular/Lymphatic: Aortic atherosclerosis. Mildly prominent bilateral inguinal nodes are likely reactive. No abdominal adenopathy. Reproductive: Hysterectomy.  No adnexal mass. Other: Pelvic floor laxity. No significant free fluid. No free intraperitoneal air. Musculoskeletal: Advanced bilateral degenerative changes of both hips. Degenerate disc disease involves the lumbosacral junction. Right hemidiaphragm elevation. IMPRESSION: 1. Moderate to markedly degraded exam, secondary to technique and patient related factors detailed above. 2. Large colonic stool burden, most consistent  with significant constipation. No other explanation for acute pain. 3. Coronary artery atherosclerosis. Aortic Atherosclerosis (ICD10-I70.0). 4. Small hiatal hernia. Electronically Signed   By: Abigail Miyamoto M.D.   On: 07/06/2021 13:45   DG Chest Port 1 View  Result Date: 07/06/2021 CLINICAL DATA:  Fever, cough EXAM: PORTABLE CHEST 1 VIEW COMPARISON:  Chest radiograph 04/17/2021 FINDINGS: Heart is at the upper limits of normal for size. The mediastinal contours are within normal limits. There is unchanged asymmetric elevation of the right hemidiaphragm. There is no focal consolidation or pulmonary edema. There is no pleural effusion or pneumothorax. There is a  1.0 cm nodular opacity in the left apex. There is no acute osseous abnormality. IMPRESSION: 1. No radiographic evidence of acute cardiopulmonary process. 2. 1.0 cm nodule in the left apex. Recommend nonemergent CT chest for further evaluation. Electronically Signed   By: Valetta Mole M.D.   On: 07/06/2021 11:32   DG Abd Portable 1 View  Result Date: 07/06/2021 CLINICAL DATA:  Fever, cough EXAM: PORTABLE ABDOMEN - 1 VIEW COMPARISON:  KUB 05/11/2021 FINDINGS: There is mild gaseous distention of multiple loops of bowel throughout the abdomen without evidence of mechanical obstruction. There is a moderate to large colonic stool burden. There is no definite free intraperitoneal air, suboptimally evaluated given supine technique. There is no abnormal soft tissue calcification. There is no acute osseous abnormality. The imaged lung bases are clear. IMPRESSION: Moderate-to-large stool burden without evidence of mechanical obstruction. Electronically Signed   By: Valetta Mole M.D.   On: 07/06/2021 11:24     EKG: pending   Pertinent labs: Glucose 245 BUN 85/Creatinine 1.69/GFR 30 - likely 3a at baseline but variable Albumin 1.8 Lactate 2.0, 1.6 WBC 18.5 Hgb 8.9 COVID/flu negative UA: small Hgb, 5 ketones, large LE, +nitrite, many bacteria, >50 WBC    Assessment/Plan * Sepsis due to undetermined organism ([place])- (present on admission) -Sepsis indicates life-threatening organ dysfunction with mortality >10%, caused by dysregulation to host response.   -SIRS criteria in this patient includes: Leukocytosis, tachycardia, tachypnea -Patient has evidence of acute organ failure with elevated lactate >2 that is not easily explained by another condition. -While awaiting blood cultures, this appears to be a preseptic condition. -Sepsis protocol initiated -Suspected source is cellulitis - although UTI also appears to be present. -Blood and urine cultures pending -Will admit due to multiple acute medical  issues -Treat with IVCefepime/Vanc for cellulitis/UTI -Lactate has cleared  Condyloma acuminatum- (present on admission) -Large apparent condylomatum -No treatment indicated at this time  Acute lower UTI- (present on admission) -UA is consistent with UTI -Patient is unable to provide effective history -While this could be the source of her sepsis, cellulitis is more likely -This should be adequately treated with sepsis abx  Pulmonary nodule 1 cm or greater in diameter- (present on admission) -Consider outpatient f/u  Constipation- (present on admission) -Abdomen is taut and distended -Imaging indicates significant constipation -Will continue home bowel regimen and add tap water enema and bowel care by nursing  Edema of left lower extremity- (present on admission) -While cellulitis is a consideration, she has marked edema and erythema of the entire LLE which is concerning for VTE -She also has extension of the erythema across her lower abdomen and extending onto the upper R thigh, which is unusual for cellulitis -STAT DVT US ordered  Dementia ([place])- (present on admission) -she does not appear to be taking medications for this issue  CKD (chronic kidney disease) stage 3, GFR 30-59 ml/min (  [place])- (present on admission) -Variable, but appears to have low stage 3a/high stage 3b CKD -Appears to be slightly worse than baseline -Will trend with hydration  Type 2 diabetes mellitus with vascular disease ([place])- (present on admission) -Recent A1c was 5.3, indicating good control -hold Glucophage -Cover with moderate-scale SSI     Advance Care Planning:   Code Status: DNR   Consults: TOC team  Family Communication: None present; I was unable to reach her niece by telephone at the time of admission  Severity of Illness: The appropriate patient status for this patient is INPATIENT. Inpatient status is judged to be reasonable and necessary in order to provide the required intensity  of service to ensure the patient's safety. The patient's presenting symptoms, physical exam findings, and initial radiographic and laboratory data in the context of their chronic comorbidities is felt to place them at high risk for further clinical deterioration. Furthermore, it is not anticipated that the patient will be medically stable for discharge from the hospital within 2 midnights of admission.   * I certify that at the point of admission it is my clinical judgment that the patient will require inpatient hospital care spanning beyond 2 midnights from the point of admission due to high intensity of service, high risk for further deterioration and high frequency of surveillance required.*  Author: Karmen Bongo 07/06/2021 5:57 PM  For on call review [email or web address].

## 2021-07-06 NOTE — Assessment & Plan Note (Signed)
-  While cellulitis is a consideration, she has marked edema and erythema of the entire LLE which is concerning for VTE -She also has extension of the erythema across her lower abdomen and extending onto the upper R thigh, which is unusual for cellulitis -STAT DVT US ordered

## 2021-07-06 NOTE — Assessment & Plan Note (Signed)
-  Abdomen is taut and distended -Imaging indicates significant constipation -Will continue home bowel regimen and add tap water enema and bowel care by nursing

## 2021-07-06 NOTE — Assessment & Plan Note (Signed)
-  Recent A1c was 5.3, indicating good control -hold Glucophage -Cover with moderate-scale SSI

## 2021-07-06 NOTE — ED Notes (Signed)
Attending at bedside.

## 2021-07-07 DIAGNOSIS — N390 Urinary tract infection, site not specified: Secondary | ICD-10-CM | POA: Diagnosis not present

## 2021-07-07 DIAGNOSIS — A419 Sepsis, unspecified organism: Secondary | ICD-10-CM | POA: Diagnosis not present

## 2021-07-07 DIAGNOSIS — E872 Acidosis, unspecified: Secondary | ICD-10-CM

## 2021-07-07 DIAGNOSIS — R6520 Severe sepsis without septic shock: Secondary | ICD-10-CM

## 2021-07-07 DIAGNOSIS — R7989 Other specified abnormal findings of blood chemistry: Secondary | ICD-10-CM

## 2021-07-07 DIAGNOSIS — N179 Acute kidney failure, unspecified: Secondary | ICD-10-CM

## 2021-07-07 DIAGNOSIS — E875 Hyperkalemia: Secondary | ICD-10-CM

## 2021-07-07 DIAGNOSIS — L8996 Pressure-induced deep tissue damage of unspecified site: Secondary | ICD-10-CM

## 2021-07-07 DIAGNOSIS — R911 Solitary pulmonary nodule: Secondary | ICD-10-CM

## 2021-07-07 DIAGNOSIS — R600 Localized edema: Secondary | ICD-10-CM

## 2021-07-07 DIAGNOSIS — K5900 Constipation, unspecified: Secondary | ICD-10-CM

## 2021-07-07 DIAGNOSIS — F03B0 Unspecified dementia, moderate, without behavioral disturbance, psychotic disturbance, mood disturbance, and anxiety: Secondary | ICD-10-CM

## 2021-07-07 DIAGNOSIS — E1159 Type 2 diabetes mellitus with other circulatory complications: Secondary | ICD-10-CM

## 2021-07-07 DIAGNOSIS — A630 Anogenital (venereal) warts: Secondary | ICD-10-CM

## 2021-07-07 DIAGNOSIS — D72825 Bandemia: Secondary | ICD-10-CM

## 2021-07-07 LAB — CBC
HCT: 26 % — ABNORMAL LOW (ref 36.0–46.0)
Hemoglobin: 8.2 g/dL — ABNORMAL LOW (ref 12.0–15.0)
MCH: 30.3 pg (ref 26.0–34.0)
MCHC: 31.5 g/dL (ref 30.0–36.0)
MCV: 95.9 fL (ref 80.0–100.0)
Platelets: 227 10*3/uL (ref 150–400)
RBC: 2.71 MIL/uL — ABNORMAL LOW (ref 3.87–5.11)
RDW: 16 % — ABNORMAL HIGH (ref 11.5–15.5)
WBC: 21.3 10*3/uL — ABNORMAL HIGH (ref 4.0–10.5)
nRBC: 0 % (ref 0.0–0.2)

## 2021-07-07 LAB — BASIC METABOLIC PANEL
Anion gap: 5 (ref 5–15)
BUN: 78 mg/dL — ABNORMAL HIGH (ref 8–23)
CO2: 24 mmol/L (ref 22–32)
Calcium: 8.6 mg/dL — ABNORMAL LOW (ref 8.9–10.3)
Chloride: 106 mmol/L (ref 98–111)
Creatinine, Ser: 1.33 mg/dL — ABNORMAL HIGH (ref 0.44–1.00)
GFR, Estimated: 39 mL/min — ABNORMAL LOW (ref >60)
Glucose, Bld: 154 mg/dL — ABNORMAL HIGH (ref 70–99)
Potassium: 5.5 mmol/L — ABNORMAL HIGH (ref 3.5–5.1)
Sodium: 135 mmol/L (ref 135–145)

## 2021-07-07 LAB — GLUCOSE, CAPILLARY
Glucose-Capillary: 121 mg/dL — ABNORMAL HIGH (ref 70–99)
Glucose-Capillary: 166 mg/dL — ABNORMAL HIGH (ref 70–99)
Glucose-Capillary: 173 mg/dL — ABNORMAL HIGH (ref 70–99)
Glucose-Capillary: 159 mg/dL — ABNORMAL HIGH (ref 70–99)

## 2021-07-07 MED ORDER — POLYETHYLENE GLYCOL 3350 17 G PO PACK: 17.0000 g | PACK | Freq: Two times a day (BID) | ORAL | Status: DC | PRN

## 2021-07-07 MED ORDER — POLYETHYLENE GLYCOL 3350 17 G PO PACK
17.0000 g | PACK | Freq: Two times a day (BID) | ORAL | Status: AC
  Administered 2021-07-07 – 2021-07-08 (×3): 17 g via ORAL
  Filled 2021-07-07 (×3): qty 1

## 2021-07-07 MED ORDER — SENNOSIDES-DOCUSATE SODIUM 8.6-50 MG PO TABS
1.0000 | ORAL_TABLET | Freq: Every day | ORAL | Status: DC
  Administered 2021-07-09 – 2021-07-11 (×3): 1 via ORAL
  Filled 2021-07-07 (×3): qty 1

## 2021-07-07 MED ORDER — CARVEDILOL 12.5 MG PO TABS
12.5000 mg | ORAL_TABLET | Freq: Two times a day (BID) | ORAL | Status: DC
  Administered 2021-07-08 – 2021-07-11 (×7): 12.5 mg via ORAL
  Filled 2021-07-07 (×7): qty 1

## 2021-07-07 MED ORDER — SODIUM ZIRCONIUM CYCLOSILICATE 10 G PO PACK
10.0000 g | PACK | Freq: Once | ORAL | Status: AC
  Administered 2021-07-07: 09:00:00 10 g via ORAL
  Filled 2021-07-07: qty 1

## 2021-07-07 MED ORDER — CARVEDILOL 12.5 MG PO TABS
12.5000 mg | ORAL_TABLET | Freq: Two times a day (BID) | ORAL | Status: DC
Start: 2021-07-07 — End: 2021-07-07

## 2021-07-07 MED ORDER — SENNOSIDES-DOCUSATE SODIUM 8.6-50 MG PO TABS
1.0000 | ORAL_TABLET | Freq: Two times a day (BID) | ORAL | Status: AC
  Administered 2021-07-07 – 2021-07-08 (×3): 1 via ORAL
  Filled 2021-07-07 (×3): qty 1

## 2021-07-07 NOTE — Progress Notes (Signed)
PROGRESS NOTE  Penny [name] PNT:[id number] DOB: June 12, 1935   PCP: Kelton Pillar, MD  Patient is from: SNF  DOA: 07/06/2021 LOS: 1  Chief complaints:  Chief Complaint  Patient presents with   Fever     Brief Narrative / Interim history: 86 year old F with PMH of dementia, CKD-3, DM-2 and chronic right hip wound brought to ED from SNF with fever, and admitted for severe sepsis due to UTI and LLE cellulitis.  She was febrile to 103 and tachycardic.  Lactate was elevated to 3.  UA concerning for UTI.  Cultures obtained.  Resuscitated with IV fluid per sepsis protocol.  Started on IV fluid and broad-spectrum antibiotics.  Lower extremity ultrasound negative for DVT but enlarged lymph nodes in bilateral groins.  Urine culture positive for GNR.  Blood culture NGTD.  Subjective: Seen and examined earlier this morning.  No major events overnight of this morning.  No complaints but not a reliable historian.  She is oriented to self, place and month but not to situation.  Has no insight into why she is in the hospital.  She thinks she is at [facility] hospital.   Objective: Vitals:   07/07/21 0023 07/07/21 0530 07/07/21 0750 07/07/21 1044  BP: (!) 109/58 (!) 110/55 (!) 106/58 136/69  Pulse: (!) 58 (!) 56 (!) 58 73  Resp: 18 18 18 19   Temp: 98 F (36.7 C) 98.2 F (36.8 C) 98.2 F (36.8 C)   TempSrc: Oral Oral Oral   SpO2: 98% 96% 96% 96%  Weight:        Examination:  GENERAL: No apparent distress.  Nontoxic. HEENT: MMM.  Vision and hearing grossly intact.  NECK: Supple.  No apparent JVD.  RESP: 96% on RA.  No IWOB.  Fair aeration bilaterally. CVS:  RRR. Heart sounds normal.  ABD/GI/GU: BS+. Abd soft, NTND.  MSK/EXT:  Moves extremities.  BLE edema, L> R.  Tenderness to palpation. SKIN: RLE erythema extending from foot to hip.  Increased warmth to touch. NEURO: Awake, alert and oriented to self, place and month.  No apparent focal neuro deficit. PSYCH: Calm. Normal affect.           Procedures:  None  Microbiology summarized: [id number] and influenza PCR nonreactive. Blood cultures NGTD. Urine culture with GNR.  Assessment & Plan: Severe sepsis due to GNR UTI and LLE cellulitis: POA-heart fever, tachycardia, AKI and lactic acidosis on presentation.  Urine culture with GNR.  Blood cultures NGTD.  LE [place] negative for DVT. -Continue IV cefepime -Discontinue IV vancomycin -Follow urine culture speciation   AKI on CKD-3A/azotemia: Improved. Recent Labs    04/20/21 0240 04/21/21 1103 05/08/21 1625 05/09/21 0544 05/10/21 0458 05/11/21 1305 05/12/21 0439 05/13/21 0526 05/15/21 0341 05/16/21 0320 07/06/21 1053 07/07/21 0143  BUN 27* 22 37* 31* 36* 46* 48* 59*  --   --  85* 78*  CREATININE 1.13* 0.93 1.04* 0.97 1.11* 1.47* 1.44* 1.16* 1.12* 1.09* 1.69* 1.33*  -Continue IV fluids -Recheck renal function in the morning    Controlled DM-2 with hyperglycemia and CKD-3A: A1c 5.6% on 04/18/2021.  On metformin at home. Recent Labs  Lab 07/06/21 2155 07/07/21 0746 07/07/21 1110  GLUCAP 156* 121* 166*  -Continue SSI-moderate   Anemia of chronic disease: H&H stable. Recent Labs    05/08/21 1625 05/09/21 0544 05/10/21 0458 05/11/21 1305 05/12/21 0439 05/13/21 0526 05/14/21 0304 05/15/21 0341 07/06/21 1053 07/07/21 0143  HGB 9.2* 7.9* 7.7* 7.9* 7.2* 7.9* 8.2* 8.3* 8.9* 8.2*  -Continue  monitoring  Essential hypertension: Seems to be on Cardizem CD.  Not sure why this was chosen.  No history of arrhythmia -Changed to Coreg given LE edema.  Condyloma acuminatum -Large apparent condylomatum -No treatment indicated at this time   Pulmonary nodule 1 cm or greater in diameter: POA -Consider outpatient CT chest   Constipation-CT abdomen and pelvis with large stool burden. -As scheduled MiraLAX and Senokot-S until BM, then as needed  BLE edema, L> R-likely from cellulitis and Cardizem.  LE [place] negative for DVT. -Monitor cellulitis as  above -Leg elevation -Changed Cardizem to Coreg as above.   Dementia without behavioral disturbance: Stable. -Reorientation and delirium precautions -Minimize/avoid sedating medications  Hyperkalemia: K5.5.  Likely due to AKI. -Lokelma 10 g x 1  Leukocytosis/bandemia-likely due to sepsis. -Antibiotics as above -Continue monitoring  Left hip wound: Unstageable.  POA.  Does not look infected. -Wound care consulted.    Body mass index is 27.11 kg/m.       VTE prophylaxis enoxaparin (LOVENOX) injection 30 mg Start: 07/06/21 2200  Code Status: DNR Family Communication: Attempted to call patient's niece for update but no answer. Level of care: Telemetry Medical Status is: Inpatient  Remains inpatient appropriate because: Severe sepsis requiring IV antibiotics, hyperkalemia and AKI   Final disposition: Likely back to SNF when medically stable.    Consultants:  None   Sch Meds:  Scheduled Meds:  (feeding supplement) PROSource Plus  30 mL Oral BID BM   acetaminophen  1,000 mg Oral Q12H   aspirin EC  81 mg Oral Daily   diltiazem  180 mg Oral Daily   docusate sodium  100 mg Oral Daily   enoxaparin (LOVENOX) injection  30 mg Subcutaneous Q24H   feeding supplement (GLUCERNA SHAKE)  237 mL Oral BID BM   hydrALAZINE  25 mg Oral Q8H   insulin aspart  0-15 Units Subcutaneous TID WC   melatonin  3 mg Oral QHS   polyethylene glycol  17 g Oral BID   senna-docusate  1 tablet Oral BID   Followed by   Derrill Memo ON 07/09/2021] senna-docusate  1 tablet Oral Daily   sodium chloride flush  3 mL Intravenous Q12H   Continuous Infusions:  ceFEPime (MAXIPIME) IV Stopped (07/06/21 1953)   lactated ringers 75 mL/hr at 07/06/21 2037   [START ON 07/08/2021] vancomycin     PRN Meds:.acetaminophen **OR** acetaminophen, bisacodyl, hydrALAZINE, morphine injection, ondansetron **OR** ondansetron (ZOFRAN) IV, oxyCODONE, polyethylene glycol **FOLLOWED BY** [START ON 07/09/2021] polyethylene  glycol  Antimicrobials: Anti-infectives (From admission, onward)    Start     Dose/Rate Route Frequency Ordered Stop   07/08/21 1400  vancomycin (VANCOREADY) IVPB 1250 mg/250 mL        1,250 mg 166.7 mL/hr over 90 Minutes Intravenous Every 48 hours 07/06/21 1222     07/06/21 1800  ceFEPIme (MAXIPIME) 2 g in sodium chloride 0.9 % 100 mL IVPB        2 g 200 mL/hr over 30 Minutes Intravenous Every 24 hours 07/06/21 1733 07/13/21 1759   07/06/21 1100  vancomycin (VANCOREADY) IVPB 1500 mg/300 mL        1,500 mg 150 mL/hr over 120 Minutes Intravenous  Once 07/06/21 1056 07/06/21 1444   07/06/21 1100  cefTRIAXone (ROCEPHIN) 2 g in sodium chloride 0.9 % 100 mL IVPB        2 g 200 mL/hr over 30 Minutes Intravenous  Once 07/06/21 1056 07/06/21 1223        I have  personally reviewed the following labs and images: CBC: Recent Labs  Lab 07/06/21 1053 07/07/21 0143  WBC 18.5* 21.3*  NEUTROABS 17.8*  --   HGB 8.9* 8.2*  HCT 28.1* 26.0*  MCV 97.9 95.9  PLT 171 227   BMP &GFR Recent Labs  Lab 07/06/21 1053 07/07/21 0143  NA 134* 135  K 4.8 5.5*  CL 104 106  CO2 22 24  GLUCOSE 245* 154*  BUN 85* 78*  CREATININE 1.69* 1.33*  CALCIUM 8.6* 8.6*   Estimated Creatinine Clearance: 31.7 mL/min (A) (by C-G formula based on SCr of 1.33 mg/dL (H)). Liver & Pancreas: Recent Labs  Lab 07/06/21 1053  AST 23  ALT 19  ALKPHOS 79  BILITOT 0.6  PROT 6.9  ALBUMIN 1.8*   No results for input(s): LIPASE, AMYLASE in the last 168 hours. No results for input(s): AMMONIA in the last 168 hours. Diabetic: No results for input(s): HGBA1C in the last 72 hours. Recent Labs  Lab 07/06/21 2155 07/07/21 0746 07/07/21 1110  GLUCAP 156* 121* 166*   Cardiac Enzymes: No results for input(s): CKTOTAL, CKMB, CKMBINDEX, TROPONINI in the last 168 hours. No results for input(s): PROBNP in the last 8760 hours. Coagulation Profile: Recent Labs  Lab 07/06/21 1053  INR 1.1   Thyroid Function  Tests: No results for input(s): TSH, T4TOTAL, FREET4, T3FREE, THYROIDAB in the last 72 hours. Lipid Profile: No results for input(s): CHOL, HDL, LDLCALC, TRIG, CHOLHDL, LDLDIRECT in the last 72 hours. Anemia Panel: No results for input(s): VITAMINB12, FOLATE, FERRITIN, TIBC, IRON, RETICCTPCT in the last 72 hours. Urine analysis:    Component Value Date/Time   COLORURINE YELLOW 07/06/2021 1053   APPEARANCEUR CLOUDY (A) 07/06/2021 1053   LABSPEC 1.016 07/06/2021 1053   PHURINE 5.0 07/06/2021 1053   GLUCOSEU NEGATIVE 07/06/2021 1053   HGBUR SMALL (A) 07/06/2021 1053   BILIRUBINUR NEGATIVE 07/06/2021 1053   KETONESUR 5 (A) 07/06/2021 1053   PROTEINUR NEGATIVE 07/06/2021 1053   NITRITE POSITIVE (A) 07/06/2021 1053   LEUKOCYTESUR LARGE (A) 07/06/2021 1053   Sepsis Labs: Invalid input(s): PROCALCITONIN, [place]  Microbiology: Recent Results (from the past 240 hour(s))  Urine Culture     Status: Abnormal (Preliminary result)   Collection Time: 07/06/21 10:53 AM   Specimen: In/Out Cath Urine  Result Value Ref Range Status   Specimen Description IN/OUT CATH URINE  Final   Special Requests NONE  Final   Culture (A)  Final    >=100,000 COLONIES/mL GRAM NEGATIVE RODS SUSCEPTIBILITIES TO FOLLOW CULTURE REINCUBATED FOR BETTER GROWTH Performed at [facility], [address], [place], [place] [address]    Report Status PENDING  Incomplete  Resp Panel by RT-PCR (Flu A&B, Covid) Nasopharyngeal Swab     Status: None   Collection Time: 07/06/21 11:02 AM   Specimen: Nasopharyngeal Swab; Nasopharyngeal(NP) swabs in vial transport medium  Result Value Ref Range Status   SARS Coronavirus 2 by RT PCR NEGATIVE NEGATIVE Final    Comment: (NOTE) SARS-CoV-2 target nucleic acids are NOT DETECTED.  The SARS-CoV-2 RNA is generally detectable in upper respiratory specimens during the acute phase of infection. The lowest concentration of SARS-CoV-2 viral copies this assay can detect is 138  copies/mL. A negative result does not preclude SARS-Cov-2 infection and should not be used as the sole basis for treatment or other patient management decisions. A negative result may occur with  improper specimen collection/handling, submission of specimen other than nasopharyngeal swab, presence of viral mutation(s) within the areas  targeted by this assay, and inadequate number of viral copies(<138 copies/mL). A negative result must be combined with clinical observations, patient history, and epidemiological information. The expected result is Negative.  Fact Sheet for Patients:  EntrepreneurPulse.com.au  Fact Sheet for Healthcare Providers:  IncredibleEmployment.be  This test is no t yet approved or cleared by the [place] FDA and  has been authorized for detection and/or diagnosis of SARS-CoV-2 by FDA under an Emergency Use Authorization (EUA). This EUA will remain  in effect (meaning this test can be used) for the duration of the COVID-19 declaration under Section 564(b)(1) of the Act, 21 U.S.C.section 360bbb-3(b)(1), unless the authorization is terminated  or revoked sooner.       Influenza A by PCR NEGATIVE NEGATIVE Final   Influenza B by PCR NEGATIVE NEGATIVE Final    Comment: (NOTE) The Xpert Xpress SARS-CoV-2/FLU/RSV plus assay is intended as an aid in the diagnosis of influenza from Nasopharyngeal swab specimens and should not be used as a sole basis for treatment. Nasal washings and aspirates are unacceptable for Xpert Xpress SARS-CoV-2/FLU/RSV testing.  Fact Sheet for Patients: EntrepreneurPulse.com.au  Fact Sheet for Healthcare Providers: IncredibleEmployment.be  This test is not yet approved or cleared by the [place] FDA and has been authorized for detection and/or diagnosis of SARS-CoV-2 by FDA under an Emergency Use Authorization (EUA). This EUA will remain in effect (meaning  this test can be used) for the duration of the COVID-19 declaration under Section 564(b)(1) of the Act, 21 U.S.C. section 360bbb-3(b)(1), unless the authorization is terminated or revoked.  Performed at [facility], [place] [address], [place], [place] [address]   Blood Culture (routine x 2)     Status: None (Preliminary result)   Collection Time: 07/06/21 11:15 AM   Specimen: BLOOD  Result Value Ref Range Status   Specimen Description BLOOD SITE NOT SPECIFIED  Final   Special Requests   Final    BOTTLES DRAWN AEROBIC AND ANAEROBIC Blood Culture adequate volume   Culture   Final    NO GROWTH < 24 HOURS Performed at [facility], [place] [address] [place], [place] [address]    Report Status PENDING  Incomplete  Blood Culture (routine x 2)     Status: None (Preliminary result)   Collection Time: 07/06/21 11:21 AM   Specimen: BLOOD  Result Value Ref Range Status   Specimen Description BLOOD SITE NOT SPECIFIED  Final   Special Requests   Final    BOTTLES DRAWN AEROBIC AND ANAEROBIC Blood Culture results may not be optimal due to an inadequate volume of blood received in culture bottles   Culture   Final    NO GROWTH < 24 HOURS Performed at [facility], [place] [address] [place], [place] [address]    Report Status PENDING  Incomplete    Radiology Studies: VAS [place] LOWER EXTREMITY VENOUS (DVT)  Result Date: 07/06/2021  Lower Venous DVT Study Patient Name:  ZEINA AKKERMAN Benda  Date of Exam:   07/06/2021 Medical Rec #: [id number]     Accession #:    [id number] Date of Birth: 1934-08-07     Patient Gender: F Patient Age:   54 years Exam Location:  [facility] Procedure:      VAS [place] LOWER EXTREMITY VENOUS (DVT) Referring Phys: Karmen Bongo --------------------------------------------------------------------------------  Comparison Study: No previous exams Performing Technologist: Jody Hill RVT, RDMS  Examination Guidelines: A complete evaluation includes B-mode imaging,  spectral Doppler, color Doppler,  and power Doppler as needed of all accessible portions of each vessel. Bilateral testing is considered an integral part of a complete examination. Limited examinations for reoccurring indications may be performed as noted. The reflux portion of the exam is performed with the patient in reverse Trendelenburg.  +--------+---------------+---------+-----------+----------+--------------------+  RIGHT    Compressibility Phasicity Spontaneity Properties Thrombus Aging        +--------+---------------+---------+-----------+----------+--------------------+  CFV      Full                                                                   +--------+---------------+---------+-----------+----------+--------------------+  SFJ      Full                                                                   +--------+---------------+---------+-----------+----------+--------------------+  FV Prox  Full                                                                   +--------+---------------+---------+-----------+----------+--------------------+  FV Mid   Full                                                                   +--------+---------------+---------+-----------+----------+--------------------+  FV       Full                                                                    Distal                                                                          +--------+---------------+---------+-----------+----------+--------------------+  PFV      Full                                                                   +--------+---------------+---------+-----------+----------+--------------------+  POP  patent by                                                                        color/doppler         +--------+---------------+---------+-----------+----------+--------------------+  PTV      Full                                                                    +--------+---------------+---------+-----------+----------+--------------------+  PERO     Full                                             Not well visualized   +--------+---------------+---------+-----------+----------+--------------------+ Limited visualization of popliteal vein and politeal fossa due to patient pain and immobility.  +---------+---------------+---------+-----------+----------+-------------------+  LEFT      Compressibility Phasicity Spontaneity Properties Thrombus Aging       +---------+---------------+---------+-----------+----------+-------------------+  CFV       Full                                                                  +---------+---------------+---------+-----------+----------+-------------------+  SFJ       Full                                                                  +---------+---------------+---------+-----------+----------+-------------------+  FV Prox   Full                                                                  +---------+---------------+---------+-----------+----------+-------------------+  FV Mid    Full                                                                  +---------+---------------+---------+-----------+----------+-------------------+  FV Distal Full                                                                  +---------+---------------+---------+-----------+----------+-------------------+  PFV       Full                                                                  +---------+---------------+---------+-----------+----------+-------------------+  POP                                                        patent by color and                                                              doppler              +---------+---------------+---------+-----------+----------+-------------------+  PTV       Full                                                                   +---------+---------------+---------+-----------+----------+-------------------+  PERO      Full                                             Not well visualized  +---------+---------------+---------+-----------+----------+-------------------+ Limited visualization of popliteal vein and politeal fossa due to patient pain and immobility.   Summary: BILATERAL: - No evidence of deep vein thrombosis seen in the lower extremities, bilaterally. - No evidence of superficial venous thrombosis in the lower extremities, bilaterally. -No evidence of popliteal cyst, bilaterally. RIGHT: - Ultrasound characteristics of enlarged lymph nodes are noted in the groin.  LEFT: - Ultrasound characteristics of enlarged lymph nodes noted in the groin.  *See table(s) above for measurements and observations.    Preliminary        Valeen Borys [address] [place]  If 7PM-7AM, please contact night-coverage [email or web address] 07/07/2021, 1:32 PM

## 2021-07-08 DIAGNOSIS — A419 Sepsis, unspecified organism: Secondary | ICD-10-CM | POA: Diagnosis not present

## 2021-07-08 DIAGNOSIS — N390 Urinary tract infection, site not specified: Secondary | ICD-10-CM | POA: Diagnosis not present

## 2021-07-08 DIAGNOSIS — E872 Acidosis, unspecified: Secondary | ICD-10-CM | POA: Diagnosis not present

## 2021-07-08 DIAGNOSIS — R7989 Other specified abnormal findings of blood chemistry: Secondary | ICD-10-CM | POA: Diagnosis not present

## 2021-07-08 LAB — CBC
HCT: 26.9 % — ABNORMAL LOW (ref 36.0–46.0)
Hemoglobin: 8.4 g/dL — ABNORMAL LOW (ref 12.0–15.0)
MCH: 30.1 pg (ref 26.0–34.0)
MCHC: 31.2 g/dL (ref 30.0–36.0)
MCV: 96.4 fL (ref 80.0–100.0)
Platelets: 250 10*3/uL (ref 150–400)
RBC: 2.79 MIL/uL — ABNORMAL LOW (ref 3.87–5.11)
RDW: 15.8 % — ABNORMAL HIGH (ref 11.5–15.5)
WBC: 14.3 10*3/uL — ABNORMAL HIGH (ref 4.0–10.5)
nRBC: 0 % (ref 0.0–0.2)

## 2021-07-08 LAB — RENAL FUNCTION PANEL
Albumin: 1.7 g/dL — ABNORMAL LOW (ref 3.5–5.0)
Anion gap: 6 (ref 5–15)
BUN: 63 mg/dL — ABNORMAL HIGH (ref 8–23)
CO2: 23 mmol/L (ref 22–32)
Calcium: 8.5 mg/dL — ABNORMAL LOW (ref 8.9–10.3)
Chloride: 105 mmol/L (ref 98–111)
Creatinine, Ser: 1.05 mg/dL — ABNORMAL HIGH (ref 0.44–1.00)
GFR, Estimated: 52 mL/min — ABNORMAL LOW (ref >60)
Glucose, Bld: 147 mg/dL — ABNORMAL HIGH (ref 70–99)
Phosphorus: 2.8 mg/dL (ref 2.5–4.6)
Potassium: 4.4 mmol/L (ref 3.5–5.1)
Sodium: 134 mmol/L — ABNORMAL LOW (ref 135–145)

## 2021-07-08 LAB — GLUCOSE, CAPILLARY
Glucose-Capillary: 120 mg/dL — ABNORMAL HIGH (ref 70–99)
Glucose-Capillary: 163 mg/dL — ABNORMAL HIGH (ref 70–99)
Glucose-Capillary: 156 mg/dL — ABNORMAL HIGH (ref 70–99)

## 2021-07-08 LAB — URINE CULTURE: Culture: >=100000 — AB

## 2021-07-08 LAB — CK: Total CK: 20 U/L — ABNORMAL LOW (ref 38–234)

## 2021-07-08 LAB — MAGNESIUM: Magnesium: 1.8 mg/dL (ref 1.7–2.4)

## 2021-07-08 MED ORDER — SODIUM CHLORIDE 0.9 % IV SOLN
1.0000 g | Freq: Three times a day (TID) | INTRAVENOUS | Status: DC
  Filled 2021-07-08 (×2): qty 1

## 2021-07-08 MED ORDER — SODIUM CHLORIDE 0.9 % IV SOLN
2.0000 g | Freq: Two times a day (BID) | INTRAVENOUS | Status: AC
  Administered 2021-07-08 – 2021-07-10 (×6): 2 g via INTRAVENOUS
  Filled 2021-07-08 (×7): qty 2

## 2021-07-08 MED ORDER — ENOXAPARIN SODIUM 40 MG/0.4ML IJ SOSY
40.0000 mg | PREFILLED_SYRINGE | INTRAMUSCULAR | Status: DC
  Administered 2021-07-08 – 2021-07-10 (×3): 40 mg via SUBCUTANEOUS
  Filled 2021-07-08 (×3): qty 0.4

## 2021-07-08 NOTE — Consult Note (Signed)
[facility] Nurse Consult Note: Reason for Consult:Chronic, healing right hip and right knee wounds. Consult completed remotely after review of medical records including photographs and discussing with Bedside RN via Secure Chat. Wound type:Surgical Pressure Injury POA: N/A Measurement:Bedside RN D. Guerrier is to obtain today and document on Nursing Flow Sheet.   Wound bed: Per MD note, wound does not appear infected Drainage (amount, consistency, odor) Small Periwound: no erythema Dressing procedure/placement/frequency: I will provided guidance for Nursing to apply a nonadherent antimicrobial dressing (xeroform) topped with dry gauze and secured and changed daily while in house.  Additionally, I have asked nursing to float heels, turn side to side and place a sacral prophylactic foam dressing.  [facility] nursing team will not follow, but will remain available to this patient, the nursing and medical teams.  Please re-consult if needed. Thanks, Maudie Flakes, MSN, RN, [place], Arther Abbott  Pager# [phone]

## 2021-07-08 NOTE — Progress Notes (Signed)
PROGRESS NOTE  Penny [name] XTK:[id number] DOB: [birth year]   PCP: Kelton Pillar, MD  Patient is from: SNF  DOA: 07/06/2021 LOS: 2  Chief complaints:  Chief Complaint  Patient presents with   Fever     Brief Narrative / Interim history: 85 year old F with PMH of dementia, CKD-3, DM-2 and chronic right hip wound brought to ED from SNF with fever, and admitted for severe sepsis due to UTI and LLE cellulitis.  She was febrile to 103 and tachycardic.  Lactate was elevated to 3.  UA concerning for UTI.  Cultures obtained.  Resuscitated with IV fluid per sepsis protocol.  Started on IV fluid and broad-spectrum antibiotics.  Lower extremity ultrasound negative for DVT but enlarged lymph nodes in bilateral groins.  Blood culture NGTD.  Antibiotics de-escalated to IV cefepime.  Urine culture with ESBL Klebsiella pneumonia.  Started on IV meropenem on 12/24 after discussion with ID.   Subjective: Seen and examined earlier this morning.  No major events overnight of this morning.  No complaints.  She denies pain, GI or UTI symptoms.  She is oriented x4 except date.  Objective: Vitals:   07/07/21 1522 07/07/21 1942 07/08/21 0451 07/08/21 0750  BP: 119/64 (!) 114/55 111/61 116/61  Pulse: (!) 51 (!) 56 (!) 50 (!) 55  Resp: 17 17 15 17   Temp: 98.5 F (36.9 C) 98.1 F (36.7 C) 97.8 F (36.6 C)   TempSrc: Oral Oral Oral   SpO2: 97% 97% 99% 98%  Weight:        Examination:  GENERAL: No apparent distress.  Nontoxic. HEENT: MMM.  Vision and hearing grossly intact.  NECK: Supple.  No apparent JVD.  RESP: 98% on RA.  No IWOB.  Fair aeration bilaterally. CVS:  RRR. Heart sounds normal.  ABD/GI/GU: BS+. Abd soft, NTND.  MSK/EXT:  Moves extremities.  BLE edema, L> R.  Some tenderness to palpation. SKIN: RLE erythema improved and receded distally..  No increased warmth to touch.  Stage IV right hip pressure ulcer. NEURO: Awake and alert. Oriented x4 except date.  No apparent focal neuro  deficit. PSYCH: Calm. Normal affect.    Procedures:  None  Microbiology summarized: [id number] and influenza PCR nonreactive. Blood cultures NGTD. Urine culture with ESBL Klebsiella pneumonia.  Assessment & Plan: Severe sepsis due to ESBL Klebsiella UTI and LLE cellulitis: POA-heart fever, tachycardia, AKI and lactic acidosis on presentation.  Patient denies UTI symptoms but not a reliable historian.  Blood and urine culture as above.  LE [place] negative for DVT.  Leukocytosis improved. -Although clinically improved on IV cefepime, switched to IV meropenem for 3 days after discussion with ID -May consider doxycycline or cephalosporin after IV meropenem to complete treatment course for cellulitis  AKI on CKD-3A/azotemia: Resolving. Recent Labs    04/21/21 1103 05/08/21 1625 05/09/21 0544 05/10/21 0458 05/11/21 1305 05/12/21 0439 05/13/21 0526 05/15/21 0341 05/16/21 0320 07/06/21 1053 07/07/21 0143 07/08/21 0150  BUN 22 37* 31* 36* 46* 48* 59*  --   --  85* 78* 63*  CREATININE 0.93 1.04* 0.97 1.11* 1.47* 1.44* 1.16* 1.12* 1.09* 1.69* 1.33* 1.05*  -Monitor off IV fluid   Controlled DM-2 with hyperglycemia and CKD-3A: A1c 5.6% on 04/18/2021.  On metformin at home. Recent Labs  Lab 07/07/21 1110 07/07/21 1607 07/07/21 1946 07/08/21 0750 07/08/21 1152  GLUCAP 166* 173* 159* 120* 163*  -Continue SSI-moderate   Anemia of chronic disease: H&H stable. Recent Labs    05/09/21 0544 05/10/21 0458 05/11/21 1305  05/12/21 0439 05/13/21 0526 05/14/21 0304 05/15/21 0341 07/06/21 1053 07/07/21 0143 07/08/21 0150  HGB 7.9* 7.7* 7.9* 7.2* 7.9* 8.2* 8.3* 8.9* 8.2* 8.4*  -Continue monitoring  Essential hypertension: Seems to be on Cardizem CD.  Not sure why this was chosen.  No history of arrhythmia -Changed to Coreg given LE edema.  Condyloma acuminatum -Large apparent condylomatum -No treatment indicated at this time   Pulmonary nodule 1 cm or greater in diameter:  POA -Consider outpatient CT chest   Constipation-CT abdomen and pelvis with large stool burden. -As scheduled MiraLAX and Senokot-S until BM, then as needed  BLE edema, L> R-likely from cellulitis and Cardizem.  LE [place] negative for DVT. -Manage cellulitis as above -Leg elevation -Changed Cardizem to Coreg as above. -Discontinue IV fluid   Dementia without behavioral disturbance: Stable. -Reorientation and delirium precautions -Minimize/avoid sedating medications  Hyperkalemia: Resolved.  Leukocytosis/bandemia-likely due to sepsis.  Improving. -Continue monitoring  Left hip wound: Unstageable.  POA.  Does not look infected. -Appreciate input by wound care   Increased nutrition need/hypoalbuminemia Body mass index is 27.11 kg/m. -Liberate diet.       VTE prophylaxis enoxaparin (LOVENOX) injection 40 mg Start: 07/08/21 2200  Code Status: DNR Family Communication: Attempted to call patient's niece for update but no answer. Level of care: Telemetry Medical Status is: Inpatient  Remains inpatient appropriate because: Severe sepsis requiring IV antibiotics   Final disposition: Likely back to SNF when medically stable.    Consultants:  Infectious disease over the phone   Sch Meds:  Scheduled Meds:  (feeding supplement) PROSource Plus  30 mL Oral BID BM   acetaminophen  1,000 mg Oral Q12H   aspirin EC  81 mg Oral Daily   carvedilol  12.5 mg Oral BID WC   docusate sodium  100 mg Oral Daily   enoxaparin (LOVENOX) injection  40 mg Subcutaneous Q24H   feeding supplement (GLUCERNA SHAKE)  237 mL Oral BID BM   hydrALAZINE  25 mg Oral Q8H   insulin aspart  0-15 Units Subcutaneous TID WC   melatonin  3 mg Oral QHS   polyethylene glycol  17 g Oral BID   senna-docusate  1 tablet Oral BID   Followed by   Derrill Memo ON 07/09/2021] senna-docusate  1 tablet Oral Daily   sodium chloride flush  3 mL Intravenous Q12H   Continuous Infusions:  lactated ringers 75 mL/hr at  07/06/21 2037   meropenem (MERREM) IV 2 g (07/08/21 1318)   PRN Meds:.acetaminophen **OR** acetaminophen, bisacodyl, hydrALAZINE, morphine injection, ondansetron **OR** ondansetron (ZOFRAN) IV, oxyCODONE, polyethylene glycol **FOLLOWED BY** [START ON 07/09/2021] polyethylene glycol  Antimicrobials: Anti-infectives (From admission, onward)    Start     Dose/Rate Route Frequency Ordered Stop   07/08/21 1400  vancomycin (VANCOREADY) IVPB 1250 mg/250 mL  Status:  Discontinued        1,250 mg 166.7 mL/hr over 90 Minutes Intravenous Every 48 hours 07/06/21 1222 07/07/21 1405   07/08/21 1400  meropenem (MERREM) 1 g in sodium chloride 0.9 % 100 mL IVPB  Status:  Discontinued        1 g 200 mL/hr over 30 Minutes Intravenous Every 8 hours 07/08/21 1216 07/08/21 1218   07/08/21 1315  meropenem (MERREM) 2 g in sodium chloride 0.9 % 100 mL IVPB        2 g 200 mL/hr over 30 Minutes Intravenous Every 12 hours 07/08/21 1218 07/11/21 0959   07/06/21 1800  ceFEPIme (MAXIPIME) 2 g in sodium  chloride 0.9 % 100 mL IVPB  Status:  Discontinued        2 g 200 mL/hr over 30 Minutes Intravenous Every 24 hours 07/06/21 1733 07/08/21 1216   07/06/21 1100  vancomycin (VANCOREADY) IVPB 1500 mg/300 mL        1,500 mg 150 mL/hr over 120 Minutes Intravenous  Once 07/06/21 1056 07/06/21 1444   07/06/21 1100  cefTRIAXone (ROCEPHIN) 2 g in sodium chloride 0.9 % 100 mL IVPB        2 g 200 mL/hr over 30 Minutes Intravenous  Once 07/06/21 1056 07/06/21 1223        I have personally reviewed the following labs and images: CBC: Recent Labs  Lab 07/06/21 1053 07/07/21 0143 07/08/21 0150  WBC 18.5* 21.3* 14.3*  NEUTROABS 17.8*  --   --   HGB 8.9* 8.2* 8.4*  HCT 28.1* 26.0* 26.9*  MCV 97.9 95.9 96.4  PLT 171 227 250   BMP &GFR Recent Labs  Lab 07/06/21 1053 07/07/21 0143 07/08/21 0150  NA 134* 135 134*  K 4.8 5.5* 4.4  CL 104 106 105  CO2 22 24 23   GLUCOSE 245* 154* 147*  BUN 85* 78* 63*  CREATININE  1.69* 1.33* 1.05*  CALCIUM 8.6* 8.6* 8.5*  MG  --   --  1.8  PHOS  --   --  2.8   Estimated Creatinine Clearance: 40.2 mL/min (A) (by C-G formula based on SCr of 1.05 mg/dL (H)). Liver & Pancreas: Recent Labs  Lab 07/06/21 1053 07/08/21 0150  AST 23  --   ALT 19  --   ALKPHOS 79  --   BILITOT 0.6  --   PROT 6.9  --   ALBUMIN 1.8* 1.7*   No results for input(s): LIPASE, AMYLASE in the last 168 hours. No results for input(s): AMMONIA in the last 168 hours. Diabetic: No results for input(s): HGBA1C in the last 72 hours. Recent Labs  Lab 07/07/21 1110 07/07/21 1607 07/07/21 1946 07/08/21 0750 07/08/21 1152  GLUCAP 166* 173* 159* 120* 163*   Cardiac Enzymes: Recent Labs  Lab 07/08/21 0150  CKTOTAL 20*   No results for input(s): PROBNP in the last 8760 hours. Coagulation Profile: Recent Labs  Lab 07/06/21 1053  INR 1.1   Thyroid Function Tests: No results for input(s): TSH, T4TOTAL, FREET4, T3FREE, THYROIDAB in the last 72 hours. Lipid Profile: No results for input(s): CHOL, HDL, LDLCALC, TRIG, CHOLHDL, LDLDIRECT in the last 72 hours. Anemia Panel: No results for input(s): VITAMINB12, FOLATE, FERRITIN, TIBC, IRON, RETICCTPCT in the last 72 hours. Urine analysis:    Component Value Date/Time   COLORURINE YELLOW 07/06/2021 1053   APPEARANCEUR CLOUDY (A) 07/06/2021 1053   LABSPEC 1.016 07/06/2021 1053   PHURINE 5.0 07/06/2021 1053   GLUCOSEU NEGATIVE 07/06/2021 1053   HGBUR SMALL (A) 07/06/2021 1053   BILIRUBINUR NEGATIVE 07/06/2021 1053   KETONESUR 5 (A) 07/06/2021 1053   PROTEINUR NEGATIVE 07/06/2021 1053   NITRITE POSITIVE (A) 07/06/2021 1053   LEUKOCYTESUR LARGE (A) 07/06/2021 1053   Sepsis Labs: Invalid input(s): PROCALCITONIN, [place]  Microbiology: Recent Results (from the past 240 hour(s))  Urine Culture     Status: Abnormal   Collection Time: 07/06/21 10:53 AM   Specimen: In/Out Cath Urine  Result Value Ref Range Status   Specimen  Description IN/OUT CATH URINE  Final   Special Requests   Final    NONE Performed at [facility], [address] [place], [place]  [address]    Culture (A)  Final    >=100,000 COLONIES/mL KLEBSIELLA PNEUMONIAE Confirmed Extended Spectrum Beta-Lactamase Producer (ESBL).  In bloodstream infections from ESBL organisms, carbapenems are preferred over piperacillin/tazobactam. They are shown to have a lower risk of mortality.    Report Status 07/08/2021 FINAL  Final   Organism ID, Bacteria KLEBSIELLA PNEUMONIAE (A)  Final      Susceptibility   Klebsiella pneumoniae - MIC*    AMPICILLIN >=32 RESISTANT Resistant     CEFAZOLIN >=64 RESISTANT Resistant     CEFEPIME >=32 RESISTANT Resistant     CEFTRIAXONE >=64 RESISTANT Resistant     CIPROFLOXACIN 2 RESISTANT Resistant     GENTAMICIN >=16 RESISTANT Resistant     IMIPENEM <=0.25 SENSITIVE Sensitive     NITROFURANTOIN 128 RESISTANT Resistant     TRIMETH/SULFA >=320 RESISTANT Resistant     AMPICILLIN/SULBACTAM >=32 RESISTANT Resistant     PIP/TAZO 16 SENSITIVE Sensitive     * >=100,000 COLONIES/mL KLEBSIELLA PNEUMONIAE  Resp Panel by RT-PCR (Flu A&B, Covid) Nasopharyngeal Swab     Status: None   Collection Time: 07/06/21 11:02 AM   Specimen: Nasopharyngeal Swab; Nasopharyngeal(NP) swabs in vial transport medium  Result Value Ref Range Status   SARS Coronavirus 2 by RT PCR NEGATIVE NEGATIVE Final    Comment: (NOTE) SARS-CoV-2 target nucleic acids are NOT DETECTED.  The SARS-CoV-2 RNA is generally detectable in upper respiratory specimens during the acute phase of infection. The lowest concentration of SARS-CoV-2 viral copies this assay can detect is 138 copies/mL. A negative result does not preclude SARS-Cov-2 infection and should not be used as the sole basis for treatment or other patient management decisions. A negative result may occur with  improper specimen collection/handling, submission of specimen other than nasopharyngeal  swab, presence of viral mutation(s) within the areas targeted by this assay, and inadequate number of viral copies(<138 copies/mL). A negative result must be combined with clinical observations, patient history, and epidemiological information. The expected result is Negative.  Fact Sheet for Patients:  EntrepreneurPulse.com.au  Fact Sheet for Healthcare Providers:  IncredibleEmployment.be  This test is no t yet approved or cleared by the [place] FDA and  has been authorized for detection and/or diagnosis of SARS-CoV-2 by FDA under an Emergency Use Authorization (EUA). This EUA will remain  in effect (meaning this test can be used) for the duration of the COVID-19 declaration under Section 564(b)(1) of the Act, 21 U.S.C.section 360bbb-3(b)(1), unless the authorization is terminated  or revoked sooner.       Influenza A by PCR NEGATIVE NEGATIVE Final   Influenza B by PCR NEGATIVE NEGATIVE Final    Comment: (NOTE) The Xpert Xpress SARS-CoV-2/FLU/RSV plus assay is intended as an aid in the diagnosis of influenza from Nasopharyngeal swab specimens and should not be used as a sole basis for treatment. Nasal washings and aspirates are unacceptable for Xpert Xpress SARS-CoV-2/FLU/RSV testing.  Fact Sheet for Patients: EntrepreneurPulse.com.au  Fact Sheet for Healthcare Providers: IncredibleEmployment.be  This test is not yet approved or cleared by the [place] FDA and has been authorized for detection and/or diagnosis of SARS-CoV-2 by FDA under an Emergency Use Authorization (EUA). This EUA will remain in effect (meaning this test can be used) for the duration of the COVID-19 declaration under Section 564(b)(1) of the Act, 21 U.S.C. section 360bbb-3(b)(1), unless the authorization is terminated or revoked.  Performed at [facility], [place] [address] [place], [place] [address]   Blood  Culture (routine  x 2)     Status: None (Preliminary result)   Collection Time: 07/06/21 11:15 AM   Specimen: BLOOD  Result Value Ref Range Status   Specimen Description BLOOD SITE NOT SPECIFIED  Final   Special Requests   Final    BOTTLES DRAWN AEROBIC AND ANAEROBIC Blood Culture adequate volume   Culture   Final    NO GROWTH 2 DAYS Performed at [facility], [address], [place], [place] [address]    Report Status PENDING  Incomplete  Blood Culture (routine x 2)     Status: None (Preliminary result)   Collection Time: 07/06/21 11:21 AM   Specimen: BLOOD  Result Value Ref Range Status   Specimen Description BLOOD SITE NOT SPECIFIED  Final   Special Requests   Final    BOTTLES DRAWN AEROBIC AND ANAEROBIC Blood Culture results may not be optimal due to an inadequate volume of blood received in culture bottles   Culture   Final    NO GROWTH 2 DAYS Performed at [facility], [place] [address] [place], [place] [address]    Report Status PENDING  Incomplete    Radiology Studies: No results found.     Denna Fryberger T. [place]  If 7PM-7AM, please contact night-coverage [email or web address] 07/08/2021, 3:44 PM

## 2021-07-08 NOTE — Plan of Care (Signed)
°  Problem: Skin Integrity: Goal: Risk for impaired skin integrity will decrease Outcome: Not Progressing   Problem: Pain Managment: Goal: General experience of comfort will improve Outcome: Not Progressing   Problem: Safety: Goal: Ability to remain free from injury will improve Outcome: Not Progressing   Problem: Elimination: Goal: Will not experience complications related to bowel motility Outcome: Not Progressing

## 2021-07-09 DIAGNOSIS — A4150 Gram-negative sepsis, unspecified: Principal | ICD-10-CM

## 2021-07-09 DIAGNOSIS — N179 Acute kidney failure, unspecified: Secondary | ICD-10-CM | POA: Diagnosis present

## 2021-07-09 DIAGNOSIS — N1831 Chronic kidney disease, stage 3a: Secondary | ICD-10-CM | POA: Diagnosis not present

## 2021-07-09 DIAGNOSIS — L03119 Cellulitis of unspecified part of limb: Secondary | ICD-10-CM | POA: Diagnosis present

## 2021-07-09 DIAGNOSIS — L03116 Cellulitis of left lower limb: Secondary | ICD-10-CM | POA: Diagnosis not present

## 2021-07-09 DIAGNOSIS — F0390 Unspecified dementia without behavioral disturbance: Secondary | ICD-10-CM

## 2021-07-09 DIAGNOSIS — L03115 Cellulitis of right lower limb: Secondary | ICD-10-CM | POA: Diagnosis present

## 2021-07-09 DIAGNOSIS — D631 Anemia in chronic kidney disease: Secondary | ICD-10-CM | POA: Diagnosis present

## 2021-07-09 LAB — GLUCOSE, CAPILLARY
Glucose-Capillary: 104 mg/dL — ABNORMAL HIGH (ref 70–99)
Glucose-Capillary: 161 mg/dL — ABNORMAL HIGH (ref 70–99)
Glucose-Capillary: 164 mg/dL — ABNORMAL HIGH (ref 70–99)
Glucose-Capillary: 169 mg/dL — ABNORMAL HIGH (ref 70–99)

## 2021-07-09 NOTE — Assessment & Plan Note (Signed)
--   Continue antibiotics as above 

## 2021-07-09 NOTE — Assessment & Plan Note (Signed)
Large warts.  Stable.

## 2021-07-09 NOTE — Assessment & Plan Note (Signed)
A1c several months ago at 5.6.  CBGs staying under 200.

## 2021-07-09 NOTE — Assessment & Plan Note (Signed)
Incidentally noted on chest x-ray on admission.  Given difficulty of getting her in for procedure given her advanced dementia, we will go ahead and check a chest CT prior to discharge.

## 2021-07-09 NOTE — Hospital Course (Signed)
86 year old with past medical history of dementia, stage III chronic kidney disease, diabetes mellitus type 2 and chronic right hip wound admitted on 12/22 for severe Klebsiella sepsis from UTI and left lower extremity cellulitis.  After discussion with infectious disease, patient has been on IV meropenem.

## 2021-07-09 NOTE — Assessment & Plan Note (Addendum)
Patient met criteria for severe sepsis on admission given tachypnea, tachycardia and leukocytosis, as well as a lactic acidosis with lactic acid level of 2.0.  Antibiotics de-escalated from cefepime down to meropenem.  Sepsis now resolved.  White count which had peaked as high as 21 down to 14.  Patient clinically improving.  Change over to p.o. antibiotics upon discharge.  White blood cell count normalized today

## 2021-07-09 NOTE — Assessment & Plan Note (Signed)
'  s stable.  Try to avoid sedatives.

## 2021-07-09 NOTE — Assessment & Plan Note (Signed)
Noted of large stool burden on CT of abdomen and pelvis.  Placed on scheduled MiraLAX and Senokot.

## 2021-07-09 NOTE — Assessment & Plan Note (Signed)
Urine positive for Klebsiella.  Continue antibiotics as above.

## 2021-07-09 NOTE — Assessment & Plan Note (Addendum)
Had mild acute kidney injury on admission.  This was secondary to sepsis.  Creatinine under 1.0 today

## 2021-07-09 NOTE — Assessment & Plan Note (Addendum)
Stable, at baseline.  Hemoglobin 8.0 today.  Recheck labs in the morning

## 2021-07-09 NOTE — Progress Notes (Signed)
[facility] Progress Note  Patient: Penny [name]    ZOX:[id number]  DOA: 07/06/2021    Date of Service: the patient was seen and examined on 07/09/2021  Brief hospital course: 86 year old with past medical history of dementia, stage III chronic kidney disease, diabetes mellitus type 2 and chronic right hip wound admitted on 12/22 for severe Klebsiella sepsis from UTI and left lower extremity cellulitis.  After discussion with infectious disease, patient has been on IV meropenem.  Assessment and Plan: Cardiovascular and Mediastinum Type 2 diabetes mellitus with vascular disease ([place]) Assessment & Plan A1c several months ago at 5.6.  CBGs staying under 200.  Nervous and Auditory Senile dementia without behavioral disturbance [facility] Assessment & Plan 's stable.  Try to avoid sedatives.  Genitourinary Chronic kidney disease, stage 3a ([place]) Assessment & Plan Had mild acute kidney injury on admission.  This was secondary to sepsis.  Creatinine down to 1.05 with GFR 52 today.  Acute lower UTI Assessment & Plan Urine positive for Klebsiella.  Continue antibiotics as above.  Other Pulmonary nodule 1 cm or greater in diameter Assessment & Plan Incidentally noted on chest x-ray on admission.  Given difficulty of getting her in for procedure given her advanced dementia, we will go ahead and check a chest CT prior to discharge.  Condyloma acuminatum Assessment & Plan Large warts.  Stable.  Edema of left lower extremity Assessment & Plan Bilateral edema, felt to be secondary from cellulitis and Cardizem.  Lower extremity ultrasound negative for DVT.  Have stopped IV fluids and change Cardizem to Coreg.  Constipation Assessment & Plan Noted of large stool burden on CT of abdomen and pelvis.  Placed on scheduled MiraLAX and Senokot.  Cellulitis of left lower extremity Assessment & Plan Continue antibiotics as above.  Anemia due to chronic kidney disease Assessment &  Plan Stable, at baseline.  * Severe sepsis with acute organ dysfunction due to gram-negative bacteria [facility]) Assessment & Plan Patient met criteria for severe sepsis on admission given tachypnea, tachycardia and leukocytosis, as well as a lactic acidosis with lactic acid level of 2.0.  Antibiotics de-escalated from cefepime down to meropenem.  Sepsis now resolved.  White count which had peaked as high as 21 down to 14.  Patient clinically improving.  Change over to p.o. antibiotics once white count normalized.  Pressure ulcer: Present on admission.  Patient with stage II sacral ulcer as well as stage IV right hip  Body mass index is 27.11 kg/m.    Pressure Injury 04/19/21 Sacrum Stage 2 -  Partial thickness loss of dermis presenting as a shallow open injury with a red, pink wound bed without slough. (Active)  04/19/21 1710  Location: Sacrum  Location Orientation:   Staging: Stage 2 -  Partial thickness loss of dermis presenting as a shallow open injury with a red, pink wound bed without slough.  Wound Description (Comments):   Present on Admission: Yes     Pressure Injury 04/19/21 Hip Right;Lateral Stage 4 - Full thickness tissue loss with exposed bone, tendon or muscle. (Active)  04/19/21 1714  Location: Hip  Location Orientation: Right;Lateral  Staging: Stage 4 - Full thickness tissue loss with exposed bone, tendon or muscle.  Wound Description (Comments):   Present on Admission: Yes     Pressure Injury 04/19/21 Knee Anterior;Right;Lateral Deep Tissue Pressure Injury - Purple or maroon localized area of discolored intact skin or blood-filled blister due to damage of underlying soft tissue from pressure and/or shear. (Active)  04/19/21 1716  Location: Knee  Location Orientation: Anterior;Right;Lateral  Staging: Deep Tissue Pressure Injury - Purple or maroon localized area of discolored intact skin or blood-filled blister due to damage of underlying soft tissue from pressure and/or  shear.  Wound Description (Comments):   Present on Admission: Yes     Consultants: Case discussed with infectious disease  Procedures: None  Antimicrobials: Antibiotics Given (last 72 hours)     Date/Time Action Medication Dose Rate   07/06/21 1806 New Bag/Given   ceFEPIme (MAXIPIME) 2 g in sodium chloride 0.9 % 100 mL IVPB 2 g 200 mL/hr   07/07/21 1848 New Bag/Given   ceFEPIme (MAXIPIME) 2 g in sodium chloride 0.9 % 100 mL IVPB 2 g 200 mL/hr   07/08/21 1318 New Bag/Given   meropenem (MERREM) 2 g in sodium chloride 0.9 % 100 mL IVPB 2 g 200 mL/hr   07/08/21 2314 New Bag/Given  [med was not availavle]   meropenem (MERREM) 2 g in sodium chloride 0.9 % 100 mL IVPB 2 g 200 mL/hr   07/09/21 1041 New Bag/Given   meropenem (MERREM) 2 g in sodium chloride 0.9 % 100 mL IVPB 2 g 200 mL/hr         Code Status: DNR   Subjective: Patient with no complaints  Objective: Vital signs were reviewed and unremarkable. Vitals:   07/09/21 0504 07/09/21 0812  BP: 120/63 123/67  Pulse: (!) 58 62  Resp: 16 18  Temp: 97.8 F (36.6 C) 98.2 F (36.8 C)  SpO2: 97% 95%    Intake/Output Summary (Last 24 hours) at 07/09/2021 1508 Last data filed at 07/09/2021 0504 Gross per 24 hour  Intake 240 ml  Output 800 ml  Net -560 ml   Filed Weights   07/06/21 2100  Weight: 73.9 kg   Body mass index is 27.11 kg/m.  Exam:  General: Alert and oriented x1, no acute distress HEENT: Normocephalic and atraumatic, mucous membranes are moist Cardiovascular: Regular rate and rhythm, S1-S2 Respiratory: Clear to auscultation bilaterally Abdomen: Soft, nontender, nondistended, positive bowel sounds Musculoskeletal: No clubbing or cyanosis, 1-2+ edema, left greater than right Skin: Minimal erythema on right lower extremity.  Noted stage IV right hip ulcer Psychiatry: Underlying dementia, but no evidence of acute psychosis  Data Reviewed: My review of labs, imaging, notes and other tests shows no  new significant findings.  White blood cell count slowly improving  Disposition:  Status is: Inpatient  Remains inpatient appropriate because: Cannot return back to skilled nursing until after holidays.  Also needs to continue IV antibiotics until white count normalized    Family Communication: Message left for family DVT Prophylaxis: enoxaparin (LOVENOX) injection 40 mg Start: 07/08/21 2200    Author: Annita Brod ,MD 07/09/2021 3:08 PM  To reach On-call, see care teams to locate the attending and reach out via [email or web address]. Between 7PM-7AM, please contact night-coverage If you still have difficulty reaching the attending provider, please page the [facility] (Director on Call) for [facility] on amion for assistance.

## 2021-07-09 NOTE — Assessment & Plan Note (Signed)
Bilateral edema, felt to be secondary from cellulitis and Cardizem.  Lower extremity ultrasound negative for DVT.  Have stopped IV fluids and change Cardizem to Coreg.

## 2021-07-10 ENCOUNTER — Inpatient Hospital Stay (HOSPITAL_COMMUNITY): Payer: Medicare Other

## 2021-07-10 DIAGNOSIS — N179 Acute kidney failure, unspecified: Secondary | ICD-10-CM | POA: Diagnosis not present

## 2021-07-10 DIAGNOSIS — N1831 Chronic kidney disease, stage 3a: Secondary | ICD-10-CM | POA: Diagnosis not present

## 2021-07-10 DIAGNOSIS — L03116 Cellulitis of left lower limb: Secondary | ICD-10-CM | POA: Diagnosis not present

## 2021-07-10 DIAGNOSIS — A4150 Gram-negative sepsis, unspecified: Secondary | ICD-10-CM | POA: Diagnosis not present

## 2021-07-10 LAB — BASIC METABOLIC PANEL
Anion gap: 4 — ABNORMAL LOW (ref 5–15)
BUN: 38 mg/dL — ABNORMAL HIGH (ref 8–23)
CO2: 25 mmol/L (ref 22–32)
Calcium: 8.2 mg/dL — ABNORMAL LOW (ref 8.9–10.3)
Chloride: 109 mmol/L (ref 98–111)
Creatinine, Ser: 0.95 mg/dL (ref 0.44–1.00)
GFR, Estimated: 59 mL/min — ABNORMAL LOW (ref >60)
Glucose, Bld: 108 mg/dL — ABNORMAL HIGH (ref 70–99)
Potassium: 4.9 mmol/L (ref 3.5–5.1)
Sodium: 138 mmol/L (ref 135–145)

## 2021-07-10 LAB — CBC
HCT: 25.9 % — ABNORMAL LOW (ref 36.0–46.0)
Hemoglobin: 8 g/dL — ABNORMAL LOW (ref 12.0–15.0)
MCH: 29.6 pg (ref 26.0–34.0)
MCHC: 30.9 g/dL (ref 30.0–36.0)
MCV: 95.9 fL (ref 80.0–100.0)
Platelets: 345 10*3/uL (ref 150–400)
RBC: 2.7 MIL/uL — ABNORMAL LOW (ref 3.87–5.11)
RDW: 16 % — ABNORMAL HIGH (ref 11.5–15.5)
WBC: 9 10*3/uL (ref 4.0–10.5)
nRBC: 0 % (ref 0.0–0.2)

## 2021-07-10 LAB — GLUCOSE, CAPILLARY
Glucose-Capillary: 118 mg/dL — ABNORMAL HIGH (ref 70–99)
Glucose-Capillary: 183 mg/dL — ABNORMAL HIGH (ref 70–99)
Glucose-Capillary: 158 mg/dL — ABNORMAL HIGH (ref 70–99)
Glucose-Capillary: 187 mg/dL — ABNORMAL HIGH (ref 70–99)

## 2021-07-10 IMAGING — CT CT CHEST W/O CM
2 of 4 series · 15 of 36 positions shown, 18 images · non-contrast
Comparison: Chest radiograph [DATE]

CLINICAL DATA: Follow-up lung nodule demonstrated on chest
radiograph.

EXAM:
CT CHEST WITHOUT CONTRAST
TECHNIQUE: Multidetector CT imaging of the chest was performed following the
standard protocol without IV contrast.

[Series 3: chest wo · axial · 0.77mm/px · z∈[+1176,+1434]mm · 12 of 153 slices shown, 15 images]
[im 12/153  mediastinal]
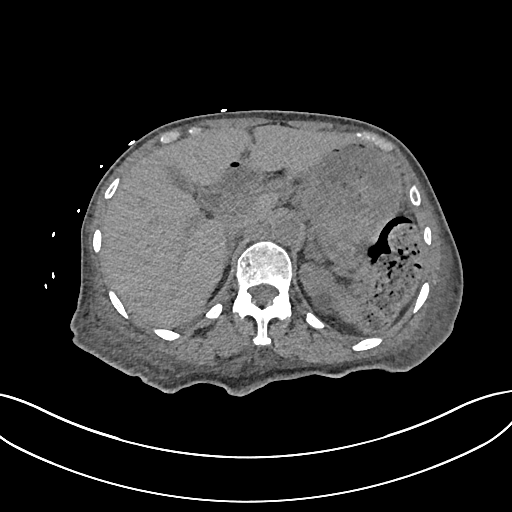
[im 12/153  lung]
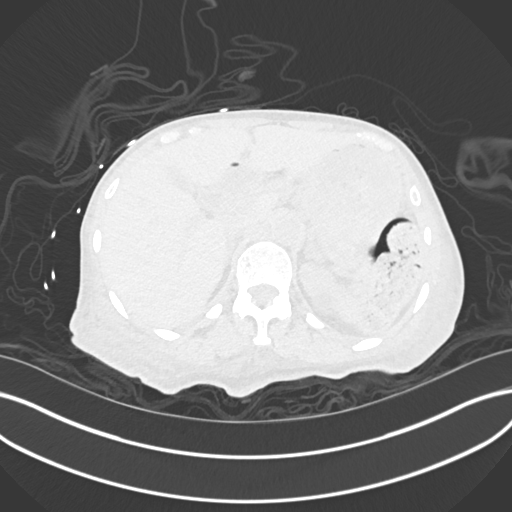
[im 24/153  lung]
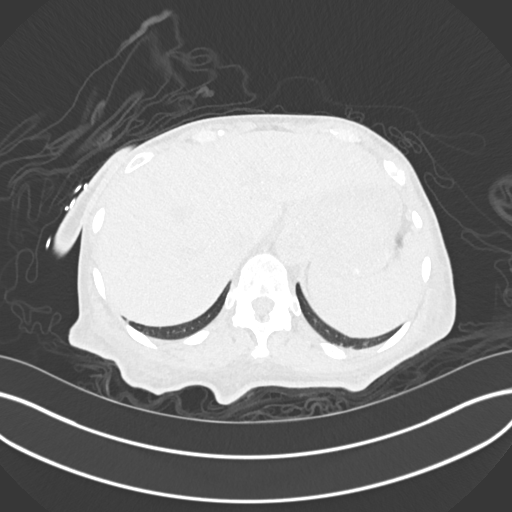
[im 36/153  lung]
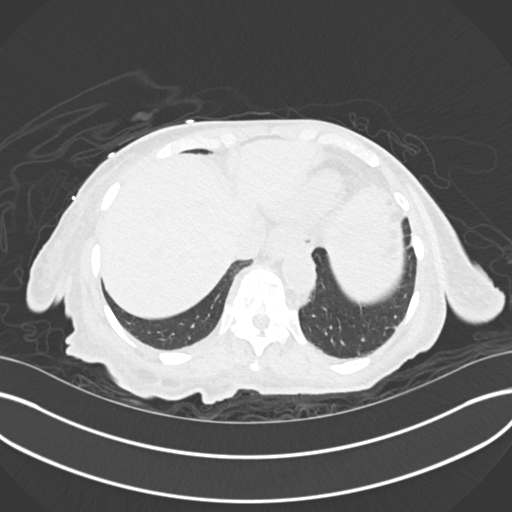
[im 47/153  lung]
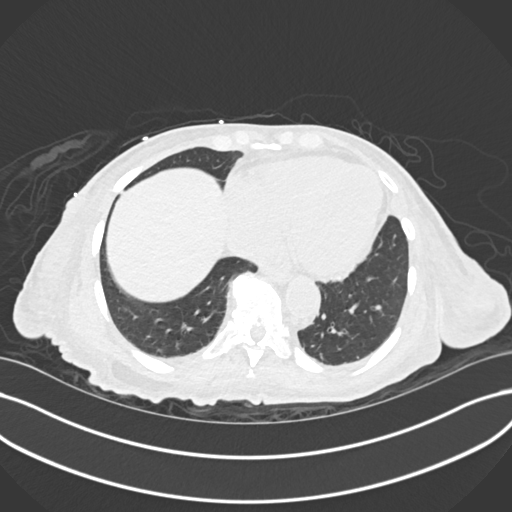
[im 59/153  mediastinal]
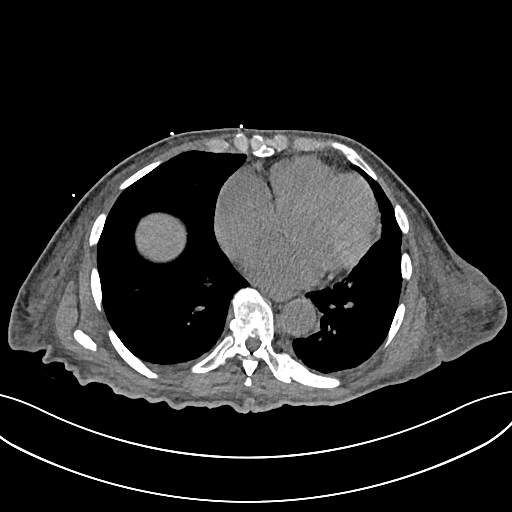
[im 59/153  lung]
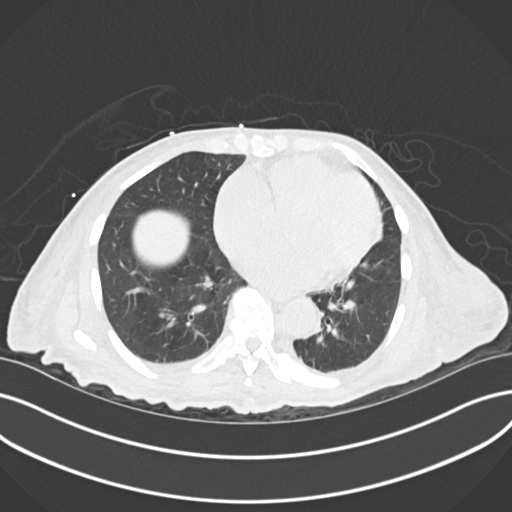
[im 71/153  lung]
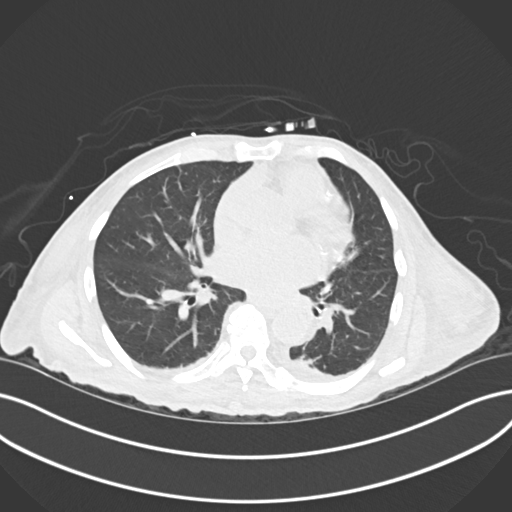
[im 82/153  lung]
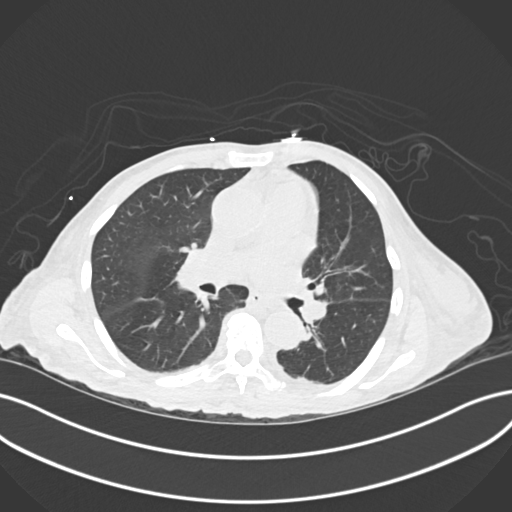
[im 94/153  lung]
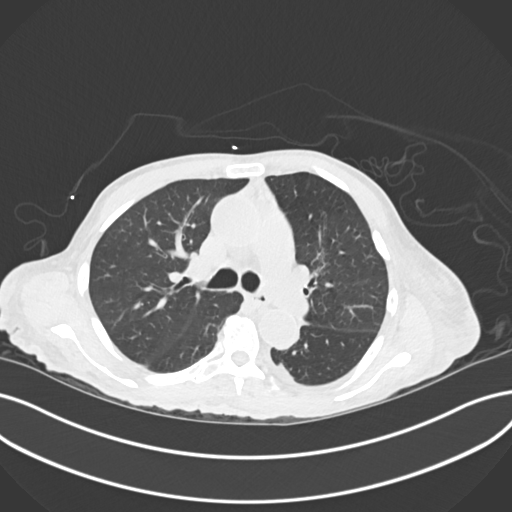
[im 106/153  mediastinal]
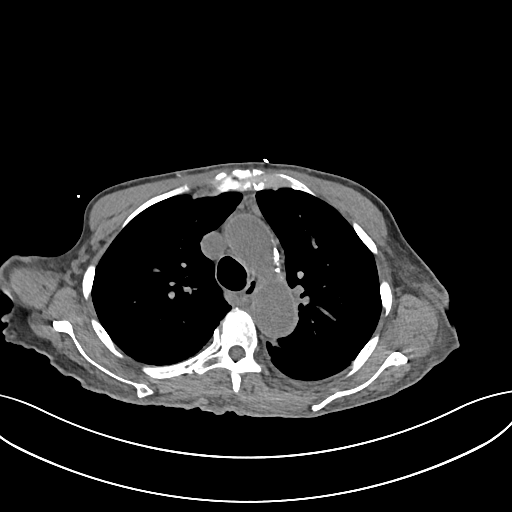
[im 106/153  lung]
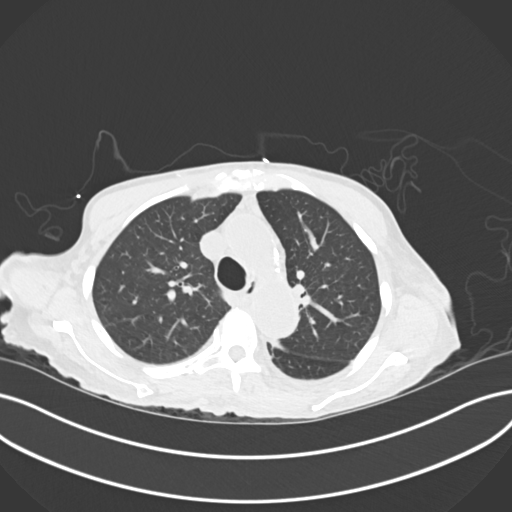
[im 117/153  lung]
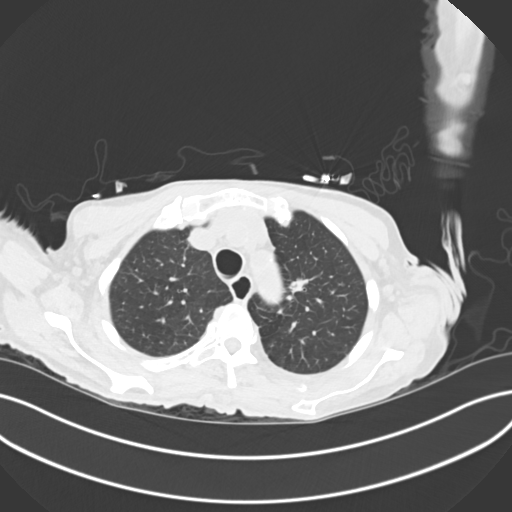
[im 129/153  lung]
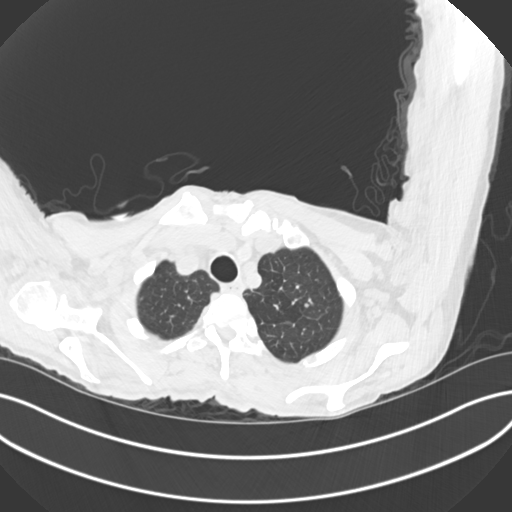
[im 141/153  lung]
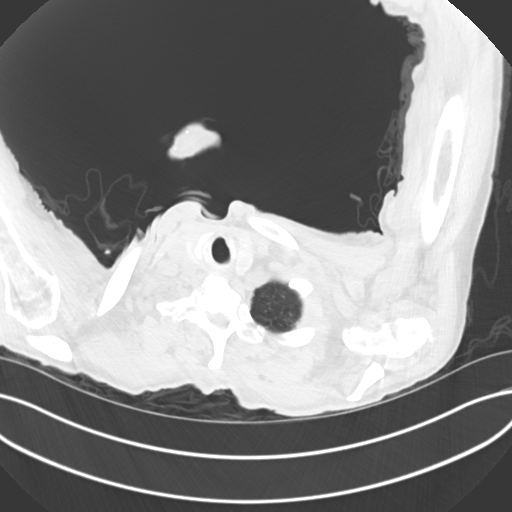

[Series 6: cor · coronal · 0.69mm/px · 3 of 130 slices shown]
[im 26/130  lung]
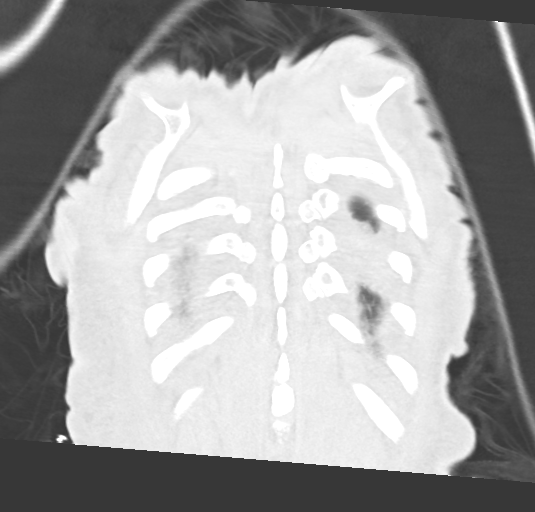
[im 52/130  lung]
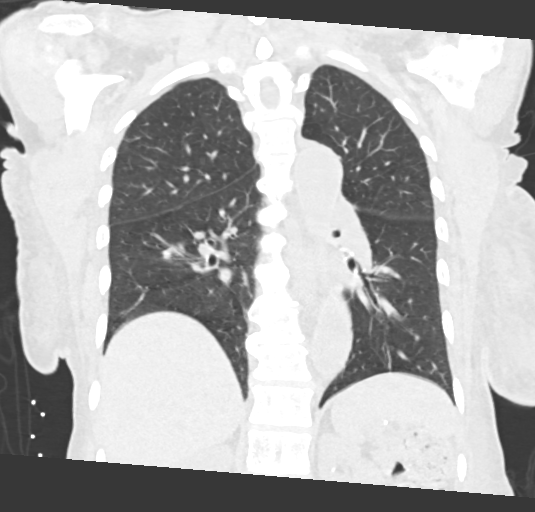
[im 78/130  lung]
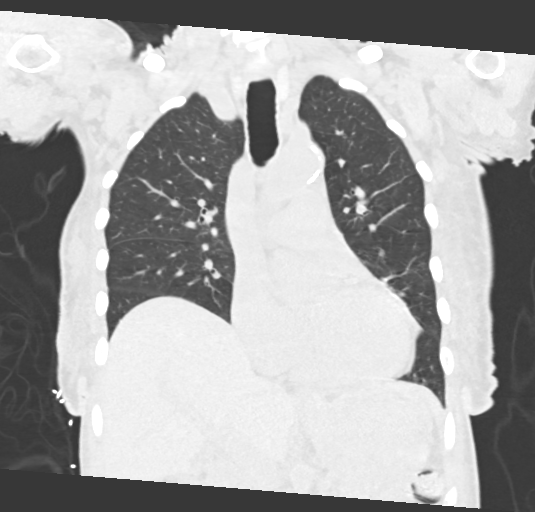

[15 of 36 positions shown; findings below may reference images not displayed]

FINDINGS: Cardiovascular: Mild cardiac enlargement. Coronary artery and aortic
calcification. No aneurysm

Mediastinum/Nodes: Moderate-sized esophageal hiatal hernia.
Esophagus is decompressed. No significant lymphadenopathy.
Heterogeneous nodular enlargement of the thyroid gland. Dominant
nodule on the left measures 1.8 cm diameter and contains
calcification. Elective ultrasound follow-up is recommended.

Lungs/Pleura: Mild atelectasis in the lung bases. Minimal subpleural
fibrosis scattered in the lungs. Mild peripheral bronchiectasis. 3
mm nodule in the left upper lung is too low to represent the chest
x-ray abnormality. This represents an incidental nodule. No focal
nodules are demonstrated to account for the chest x-ray abnormality.
Likely this represents summation of shadows or bony changes. No
pleural effusion. No pneumothorax.

Upper Abdomen: No acute abnormalities demonstrated in the visualized
upper abdomen.

Musculoskeletal: Degenerative changes in the spine and shoulders. No
destructive bone lesions.
IMPRESSION: 1. No focal pulmonary nodules demonstrated to account for the chest
radiographic abnormality. This likely represented summation of
shadows or bone structure.
2. 3 mm nodule in the left upper lung is demonstrated but this is
not in the same location is the chest radiographic abnormality. No
follow-up needed if patient is low-risk. Non-contrast chest CT can
be considered in 12 months if patient is high-risk. This
recommendation follows the consensus statement: Guidelines for
Management of Incidental Pulmonary Nodules Detected on CT Images:
3. Heterogeneous calcified thyroid nodule measuring 1.8 cm diameter.
Recommend thyroid US (ref: [HOSPITAL]. [DATE]):

## 2021-07-10 MED ORDER — HYDROCERIN EX CREA
TOPICAL_CREAM | Freq: Two times a day (BID) | CUTANEOUS | Status: DC
  Filled 2021-07-10: qty 113

## 2021-07-10 NOTE — TOC Initial Note (Signed)
Transition of Care [facility]) - Initial/Assessment Note    Patient Details  Name: Penny [name] MRN: [id number] Date of Birth: 1934/10/27  [facility] CM/SW Contact:    Amador Cunas, [place] Phone Number: 07/10/2021, 3:11 PM  Clinical Narrative:   Spoke to [facility] with [facility] who confirmed pt is a LTC resident and is able to return at dc. Will submit for [facility] auth as a courtesy (IV antibiotic therapy) but pt is able to return while Josem Kaufmann is pending or if denied. [employer] is closed today due to holiday but request was submitted via fax. Will need COVID test prior to dc. EDD is 12/27 per MD. SW will assist as indicated.                 Expected Discharge Plan: Skilled Nursing Facility Barriers to Discharge: Continued Medical Work up   Patient Goals and CMS Choice        Expected Discharge Plan and Services Expected Discharge Plan: [place] Acute Care Choice: [place] Living arrangements for the past 2 months: [place]                                      Prior Living Arrangements/Services Living arrangements for the past 2 months: [place]                     Activities of Daily Living      Permission Sought/Granted                  Emotional Assessment       Orientation: : Fluctuating Orientation (Suspected and/or reported Sundowners) Alcohol / Substance Use: Not Applicable Psych Involvement: No (comment)  Admission diagnosis:  Acute kidney injury ([place]) [N17.9] Sepsis due to undetermined organism ([place]) [A41.9] Sepsis, due to unspecified organism, unspecified whether acute organ dysfunction present [facility]) [A41.9] Patient Active Problem List   Diagnosis Date Noted   Cellulitis of left lower extremity 07/09/2021   AKI (acute kidney injury) ([place]) 07/09/2021   Anemia due to chronic kidney disease 07/09/2021   Severe sepsis with acute organ dysfunction  due to gram-negative bacteria ([place]) 07/06/2021   Edema of left lower extremity 07/06/2021   Constipation 07/06/2021   Pulmonary nodule 1 cm or greater in diameter 07/06/2021   Acute lower UTI 07/06/2021   Condyloma acuminatum 07/06/2021   Protein-calorie malnutrition, severe 05/11/2021   Acute prerenal azotemia 05/09/2021   Acute on chronic anemia 05/09/2021   Senile dementia without behavioral disturbance (HCC)    Pressure injury of right hip, stage 4 ([place])    Complicated open wound of right hip 05/08/2021   Malnutrition of moderate degree 67/89/3810   Acute metabolic encephalopathy 17/51/0258   Hypothermia due to exposure 04/17/2021   Pressure ulcer, shoulder blades, stage II ([place]) - right posterior shoulder 04/17/2021   Pressure ulcer, hip, right, unstageable ([place]) 04/17/2021   Chronic kidney disease, stage 3a ([place]) 04/17/2021   Pain due to onychomycosis of toenails of both feet 02/01/2021   Type 2 diabetes mellitus with vascular disease ([place]) 02/01/2021   PCP:  Kelton Pillar, MD Pharmacy:  No Pharmacies Listed    Social Determinants of Health (SDOH) Interventions    Readmission Risk Interventions No flowsheet data found.

## 2021-07-10 NOTE — Progress Notes (Signed)
[facility] Progress Note  Patient: Penny [name]    JEH:[id number]  DOA: 07/06/2021    Date of Service: the patient was seen and examined on 07/10/2021  Brief hospital course: 86 year old with past medical history of dementia, stage III chronic kidney disease, diabetes mellitus type 2 and chronic right hip wound admitted on 12/22 for severe Klebsiella sepsis from UTI and left lower extremity cellulitis.  After discussion with infectious disease, patient has been on IV meropenem.  Assessment and Plan: Cardiovascular and Mediastinum Type 2 diabetes mellitus with vascular disease ([place]) Assessment & Plan A1c several months ago at 5.6.  CBGs staying under 200.  Nervous and Auditory Senile dementia without behavioral disturbance [facility]) Assessment & Plan 's stable.  Try to avoid sedatives.  Genitourinary AKI (acute kidney injury) [facility]) Assessment & Plan Resolved  Chronic kidney disease, stage 3a ([place]) Assessment & Plan Had mild acute kidney injury on admission.  This was secondary to sepsis.  Creatinine under 1.0 today  Acute lower UTI Assessment & Plan Urine positive for Klebsiella.  Continue antibiotics as above.  Other Pulmonary nodule 1 cm or greater in diameter Assessment & Plan Incidentally noted on chest x-ray on admission.  Given difficulty of getting her in for procedure given her advanced dementia, we will go ahead and check a chest CT prior to discharge.  Condyloma acuminatum Assessment & Plan Large warts.  Stable.  Edema of left lower extremity Assessment & Plan Bilateral edema, felt to be secondary from cellulitis and Cardizem.  Lower extremity ultrasound negative for DVT.  Have stopped IV fluids and change Cardizem to Coreg.  Anemia due to chronic kidney disease Assessment & Plan Stable, at baseline.  Hemoglobin 8.0 today.  Recheck labs in the morning  Constipation Assessment & Plan Noted of large stool burden on CT of abdomen and pelvis.  Placed on  scheduled MiraLAX and Senokot.  Cellulitis of left lower extremity Assessment & Plan Continue antibiotics as above.  * Severe sepsis with acute organ dysfunction due to gram-negative bacteria [facility]) Assessment & Plan Patient met criteria for severe sepsis on admission given tachypnea, tachycardia and leukocytosis, as well as a lactic acidosis with lactic acid level of 2.0.  Antibiotics de-escalated from cefepime down to meropenem.  Sepsis now resolved.  White count which had peaked as high as 21 down to 14.  Patient clinically improving.  Change over to p.o. antibiotics upon discharge.  White blood cell count normalized today  Pressure ulcer: Present on admission.  Patient with stage II sacral ulcer as well as stage IV right hip  Body mass index is 27.11 kg/m.    Pressure Injury 04/19/21 Sacrum Stage 2 -  Partial thickness loss of dermis presenting as a shallow open injury with a red, pink wound bed without slough. (Active)  04/19/21 1710  Location: Sacrum  Location Orientation:   Staging: Stage 2 -  Partial thickness loss of dermis presenting as a shallow open injury with a red, pink wound bed without slough.  Wound Description (Comments):   Present on Admission: Yes     Pressure Injury 04/19/21 Hip Right;Lateral Stage 4 - Full thickness tissue loss with exposed bone, tendon or muscle. (Active)  04/19/21 1714  Location: Hip  Location Orientation: Right;Lateral  Staging: Stage 4 - Full thickness tissue loss with exposed bone, tendon or muscle.  Wound Description (Comments):   Present on Admission: Yes     Pressure Injury 04/19/21 Knee Anterior;Right;Lateral Deep Tissue Pressure Injury - Purple or maroon localized  area of discolored intact skin or blood-filled blister due to damage of underlying soft tissue from pressure and/or shear. (Active)  04/19/21 1716  Location: Knee  Location Orientation: Anterior;Right;Lateral  Staging: Deep Tissue Pressure Injury - Purple or maroon  localized area of discolored intact skin or blood-filled blister due to damage of underlying soft tissue from pressure and/or shear.  Wound Description (Comments):   Present on Admission: Yes     Consultants: Case discussed with infectious disease  Procedures: None  Antimicrobials: Antibiotics Given (last 72 hours)     Date/Time Action Medication Dose Rate   07/07/21 1848 New Bag/Given   ceFEPIme (MAXIPIME) 2 g in sodium chloride 0.9 % 100 mL IVPB 2 g 200 mL/hr   07/08/21 1318 New Bag/Given   meropenem (MERREM) 2 g in sodium chloride 0.9 % 100 mL IVPB 2 g 200 mL/hr   07/08/21 2314 New Bag/Given  [med was not availavle]   meropenem (MERREM) 2 g in sodium chloride 0.9 % 100 mL IVPB 2 g 200 mL/hr   07/09/21 1041 New Bag/Given   meropenem (MERREM) 2 g in sodium chloride 0.9 % 100 mL IVPB 2 g 200 mL/hr   07/09/21 2109 New Bag/Given   meropenem (MERREM) 2 g in sodium chloride 0.9 % 100 mL IVPB 2 g 200 mL/hr   07/10/21 1015 New Bag/Given   meropenem (MERREM) 2 g in sodium chloride 0.9 % 100 mL IVPB 2 g 200 mL/hr         Code Status: DNR   Subjective: Patient with no complaints  Objective: Vital signs were reviewed and unremarkable. Vitals:   07/10/21 0452 07/10/21 0755  BP: 128/79 (!) 152/66  Pulse:  61  Resp: 17 18  Temp: 98.3 F (36.8 C) 97.8 F (36.6 C)  SpO2:  95%    Intake/Output Summary (Last 24 hours) at 07/10/2021 1436 Last data filed at 07/10/2021 0452 Gross per 24 hour  Intake 460 ml  Output 1200 ml  Net -740 ml    Filed Weights   07/06/21 2100  Weight: 73.9 kg   Body mass index is 27.11 kg/m.  Exam:  General: Alert and oriented x1, no acute distress HEENT: Normocephalic and atraumatic, mucous membranes are moist Cardiovascular: Regular rate and rhythm, S1-S2 Respiratory: Clear to auscultation bilaterally Abdomen: Soft, nontender, nondistended, positive bowel sounds Musculoskeletal: No clubbing or cyanosis, 1-2+ edema, left greater than  right Skin: Minimal erythema on right lower extremity.  Noted stage IV right hip ulcer Psychiatry: Underlying dementia, but no evidence of acute psychosis  Data Reviewed: My review of labs, imaging, notes and other tests shows no new significant findings.  White blood cell count slowly improving  Disposition:  Status is: Inpatient  Remains inpatient appropriate because: Continue IV antibiotics.  Anticipate discharge back to skilled nursing tomorrow, 12/27    Family Communication: Niece at the bedside DVT Prophylaxis: enoxaparin (LOVENOX) injection 40 mg Start: 07/08/21 2200    Author: Annita Brod ,MD 07/10/2021 2:36 PM  To reach On-call, see care teams to locate the attending and reach out via [email or web address]. Between 7PM-7AM, please contact night-coverage If you still have difficulty reaching the attending provider, please page the [facility] (Director on Call) for [facility] on amion for assistance.

## 2021-07-10 NOTE — Assessment & Plan Note (Signed)
Resolved

## 2021-07-11 DIAGNOSIS — L8990 Pressure ulcer of unspecified site, unspecified stage: Secondary | ICD-10-CM | POA: Diagnosis present

## 2021-07-11 DIAGNOSIS — E041 Nontoxic single thyroid nodule: Secondary | ICD-10-CM | POA: Diagnosis present

## 2021-07-11 DIAGNOSIS — A4150 Gram-negative sepsis, unspecified: Secondary | ICD-10-CM | POA: Diagnosis not present

## 2021-07-11 DIAGNOSIS — N1831 Chronic kidney disease, stage 3a: Secondary | ICD-10-CM | POA: Diagnosis not present

## 2021-07-11 DIAGNOSIS — L03116 Cellulitis of left lower limb: Secondary | ICD-10-CM | POA: Diagnosis not present

## 2021-07-11 DIAGNOSIS — F0390 Unspecified dementia without behavioral disturbance: Secondary | ICD-10-CM | POA: Diagnosis not present

## 2021-07-11 LAB — GLUCOSE, CAPILLARY
Glucose-Capillary: 128 mg/dL — ABNORMAL HIGH (ref 70–99)
Glucose-Capillary: 116 mg/dL — ABNORMAL HIGH (ref 70–99)

## 2021-07-11 LAB — RESP PANEL BY RT-PCR (FLU A&B, COVID) ARPGX2
Influenza A by PCR: NEGATIVE
Influenza B by PCR: NEGATIVE
SARS Coronavirus 2 by RT PCR: NEGATIVE

## 2021-07-11 LAB — CULTURE, BLOOD (ROUTINE X 2)
Culture: NO GROWTH
Culture: NO GROWTH
Special Requests: ADEQUATE

## 2021-07-11 MED ORDER — HYDROCERIN EX CREA: 1.0000 "application " | TOPICAL_CREAM | Freq: Two times a day (BID) | CUTANEOUS | 0 refills | Status: DC

## 2021-07-11 MED ORDER — DOXYCYCLINE HYCLATE 50 MG PO CAPS: 100.0000 mg | ORAL_CAPSULE | Freq: Two times a day (BID) | ORAL | 0 refills | Status: DC

## 2021-07-11 MED ORDER — CARVEDILOL 6.25 MG PO TABS: 12.5000 mg | ORAL_TABLET | Freq: Two times a day (BID) | ORAL | 1 refills | Status: DC

## 2021-07-11 NOTE — Consult Note (Signed)
[facility] Inpatient Consult   07/11/2021  JAID QUIRION [birth year] [phone]  [employer] [facility] CM)   Patient chart has been reviewed with noted high risk score for unplanned readmission. Assessed for community [facility] CM follow up needs. Chart review reveals patient is LTC resident at [facility]. No [facility] CM needs.  Netta Cedars, MSN, RN [facility] [employer] [phone]  Toll free office [phone]

## 2021-07-11 NOTE — Discharge Summary (Addendum)
Physician Discharge Summary   Patient: Penny [name] MRN: [id number] DOB: '@DOB'   Admit date:     07/06/2021  Discharge date: 07/11/21  Discharge Physician: Annita Brod   PCP: Kelton Pillar, MD   Recommendations at discharge: 1.  Medication change: Cardizem discontinued 2. new medication: Coreg 6.25 mg p.o. twice daily 3. patient will follow up with her PCP and at that time, consideration for thyroid ultrasound if felt to be appropriate. 4. new medication: Doxycycline 100 mg p.o. twice daily x3 days 5.  Wound care to right hip and right knee:  Cleanse with NS, apt dry.  Cover with folded piece of xeroform gauze, top with dry gauze and secure with paper tape. Change daily.  Hospital Course   86 year old with past medical history of dementia, stage III chronic kidney disease, diabetes mellitus type 2 and chronic right hip wound admitted on 12/22 for severe Klebsiella sepsis from UTI and left lower extremity cellulitis.  After discussion with infectious disease, patient changed to IV meropenem.  By 12/26, white count normalized and antibiotics changed to p.o. doxycycline for discharge.  Discharge Diagnoses * Severe sepsis with acute organ dysfunction due to gram-negative bacteria [facility]) Assessment & Plan Patient met criteria for severe sepsis on admission given tachypnea, tachycardia and leukocytosis, as well as a lactic acidosis with lactic acid level of 2.0.  Antibiotics de-escalated from cefepime down to meropenem.  Sepsis now resolved.  White count which had peaked as high as 21 down to 14.  Patient clinically improving.  Change over to p.o. antibiotics upon discharge.  White blood cell count normalized by 12/26.  Cardiovascular and Mediastinum Type 2 diabetes mellitus with vascular disease ([place]) Assessment & Plan A1c several months ago at 5.6.  CBGs staying under 200.  On sliding scale during hospitalization.  Continue metformin on discharge.  Nervous and Auditory Senile dementia  without behavioral disturbance (HCC) Assessment & Plan Remained stable during hospitalization.  Genitourinary AKI (acute kidney injury) [facility]) Assessment & Plan Resolved  Chronic kidney disease, stage 3a ([place]) Assessment & Plan Had mild acute kidney injury on admission.  This was secondary to sepsis.  Creatinine under 1.0 by 5/26  Acute lower UTI Assessment & Plan Urine positive for Klebsiella.  Antibiotics as mentioned above.  Other Pulmonary nodule 1 cm or greater in diameter Assessment & Plan Incidentally noted on chest x-ray on admission.  Given difficulty of getting her in for procedure given her advanced dementia, CT checked noting no evidence of focal nodule seen on chest x-ray and attributed to shadow or bone structure.  Separate 3 mm nodule in the left upper lung noted on CT that was not seen on chest x-ray but no follow-up recommended if patient is low risk.  Patient also noted to have a heterogeneous calcified thyroid nodule measuring 1.8 cm.  Thyroid ultrasound recommended.  This can be done as outpatient.  Condyloma acuminatum Assessment & Plan Large warts.  Stable.  Edema of left lower extremity Assessment & Plan Bilateral edema, felt to be secondary from cellulitis and Cardizem.  Lower extremity ultrasound negative for DVT.  Cardizem discontinued for edema sake.  Ongoing.  Anemia due to chronic kidney disease Assessment & Plan Stable, at baseline.  Hemoglobin 8.0 on 5/26, prior to discharge  Constipation Assessment & Plan Noted of large stool burden on CT of abdomen and pelvis.  Placed on scheduled MiraLAX and Senokot.  Responded well.  Continue bowel regimen upon discharge  Cellulitis of left lower extremity Assessment & Plan  After changing over to meropenem and completing 3 days, will be discharged on 3 more days of p.o. doxycycline.  White count normalized.  Pressure injury of skin Pressure ulcers present on admission including stage II sacral ulcer, stage  III and IV on lateral aspect of right hip and a stage III on the right lateral knee.  Appreciate wound care help. Assessment & Plan Pressure Injury 04/19/21 Sacrum Stage 2 -  Partial thickness loss of dermis presenting as a shallow open injury with a red, pink wound bed without slough. (Active)  04/19/21 1710  Location: Sacrum (GLUTEAL FOLD)  Location Orientation:   Staging: Stage 2 -  Partial thickness loss of dermis presenting as a shallow open injury with a red, pink wound bed without slough.  Wound Description (Comments):   Present on Admission: Yes     Pressure Injury 04/19/21 Hip Right;Lateral Stage 4 - Full thickness tissue loss with exposed bone, tendon or muscle. (Active)  04/19/21 1714  Location: Hip  Location Orientation: Right;Lateral  Staging: Stage 4 - Full thickness tissue loss with exposed bone, tendon or muscle.  Wound Description (Comments):   Present on Admission: Yes     Pressure Injury 04/19/21 Knee Anterior;Right;Lateral Deep Tissue Pressure Injury - Purple or maroon localized area of discolored intact skin or blood-filled blister due to damage of underlying soft tissue from pressure and/or shear. (Active)  04/19/21 1716  Location: Knee  Location Orientation: Anterior;Right;Lateral  Staging: Deep Tissue Pressure Injury - Purple or maroon localized area of discolored intact skin or blood-filled blister due to damage of underlying soft tissue from pressure and/or shear.  Wound Description (Comments):   Present on Admission: Yes     Pressure Injury 07/10/21 Knee Right;Lateral Stage 3 -  Full thickness tissue loss. Subcutaneous fat may be visible but bone, tendon or muscle are NOT exposed. (Active)  07/10/21 1930  Location: Knee  Location Orientation: Right;Lateral  Staging: Stage 3 -  Full thickness tissue loss. Subcutaneous fat may be visible but bone, tendon or muscle are NOT exposed.  Wound Description (Comments):   Present on Admission: Yes     Pressure Injury  07/10/21 Hip Right Stage 3 -  Full thickness tissue loss. Subcutaneous fat may be visible but bone, tendon or muscle are NOT exposed. (Active)  07/10/21 1930  Location: Hip  Location Orientation: Right  Staging: Stage 3 -  Full thickness tissue loss. Subcutaneous fat may be visible but bone, tendon or muscle are NOT exposed.  Wound Description (Comments):   Present on Admission: Yes   Consultants: None Procedures performed: None Disposition: Skilled nursing facility Diet recommendation: Carb modified diet with Glucerna and Protostat supplements between meals twice a day  DISCHARGE MEDICATION: Allergies as of 07/11/2021       Reactions   Glipizide Rash        Medication List     STOP taking these medications    diltiazem 180 MG 24 hr capsule Commonly known as: CARDIZEM CD   senna 8.6 MG Tabs tablet Commonly known as: SENOKOT       TAKE these medications    acetaminophen 650 MG suppository Commonly known as: TYLENOL Place 650 mg rectally every 6 (six) hours as needed (general discomfort).   acetaminophen 325 MG tablet Commonly known as: TYLENOL Take 650 mg by mouth every 6 (six) hours as needed for mild pain.   acetaminophen 500 MG tablet Commonly known as: TYLENOL Take 1,000 mg by mouth every 12 (twelve) hours.   aspirin  81 MG tablet Take 81 mg by mouth daily.   carvedilol 6.25 MG tablet Commonly known as: COREG Take 2 tablets (12.5 mg total) by mouth 2 (two) times daily with a meal.   cetirizine 10 MG tablet Commonly known as: ZYRTEC Take 10 mg by mouth daily as needed (itching).   doxycycline 50 MG capsule Commonly known as: VIBRAMYCIN Take 2 capsules (100 mg total) by mouth 2 (two) times daily.   feeding supplement (GLUCERNA SHAKE) Liqd Take 237 mLs by mouth 2 (two) times daily between meals.   feeding supplement (PRO-STAT SUGAR FREE 64) Liqd Take 30 mLs by mouth 2 (two) times daily.   hydrALAZINE 25 MG tablet Commonly known as:  APRESOLINE Take 1 tablet (25 mg total) by mouth every 8 (eight) hours.   hydrocerin Crea Apply 1 application topically 2 (two) times daily.   magnesium hydroxide 400 MG/5ML suspension Commonly known as: MILK OF MAGNESIA Take 30 mLs by mouth every 12 (twelve) hours as needed for mild constipation.   melatonin 3 MG Tabs tablet Take 3 mg by mouth at bedtime.   metFORMIN 500 MG tablet Commonly known as: GLUCOPHAGE Take 500 mg by mouth 2 (two) times daily with a meal.   Multivitamin Adults Tabs Take 1 tablet by mouth daily.   ondansetron 4 MG tablet Commonly known as: ZOFRAN Take 4 mg by mouth every 6 (six) hours as needed for nausea or vomiting.   polyethylene glycol 17 g packet Commonly known as: MIRALAX / GLYCOLAX Take 17 g by mouth daily.   sennosides-docusate sodium 8.6-50 MG tablet Commonly known as: SENOKOT-S Take 2 tablets by mouth at bedtime.         Discharge Exam: Filed Weights   07/06/21 2100 07/11/21 0414  Weight: 73.9 kg 65.8 kg     Condition at discharge: stable  The results of significant diagnostics from this hospitalization (including imaging, microbiology, ancillary and laboratory) are listed below for reference.   Imaging Studies: CT ABDOMEN PELVIS WO CONTRAST  Result Date: 07/06/2021 CLINICAL DATA:  Sepsis.  Abdominal pain. EXAM: CT ABDOMEN AND PELVIS WITHOUT CONTRAST TECHNIQUE: Multidetector CT imaging of the abdomen and pelvis was performed following the standard protocol without IV contrast. COMPARISON:  Plain film of earlier today.  No correlate CT. FINDINGS: Lower chest: Motion degradation. Left base atelectasis. Mild cardiomegaly with small hiatal hernia. Lad coronary artery calcification. Hepatobiliary: Multifactorial degradation, including lack of oral/IV contrast, minimal motion, overlying wires and leads. No gross abnormality within the liver, gallbladder, biliary tract. Pancreas: Grossly within normal limits. Spleen: Normal in size,  without focal abnormality. Adrenals/Urinary Tract: Normal adrenal glands. Favor dystrophic calcification over punctate left renal collecting system calculus. No gross hydronephrosis. No bladder calculi. Stomach/Bowel: Normal remainder of the stomach. Large colonic stool burden throughout. Terminal ileum and appendix not identified. Normal small bowel caliber. Vascular/Lymphatic: Aortic atherosclerosis. Mildly prominent bilateral inguinal nodes are likely reactive. No abdominal adenopathy. Reproductive: Hysterectomy.  No adnexal mass. Other: Pelvic floor laxity. No significant free fluid. No free intraperitoneal air. Musculoskeletal: Advanced bilateral degenerative changes of both hips. Degenerate disc disease involves the lumbosacral junction. Right hemidiaphragm elevation. IMPRESSION: 1. Moderate to markedly degraded exam, secondary to technique and patient related factors detailed above. 2. Large colonic stool burden, most consistent with significant constipation. No other explanation for acute pain. 3. Coronary artery atherosclerosis. Aortic Atherosclerosis (ICD10-I70.0). 4. Small hiatal hernia. Electronically Signed   By: Abigail Miyamoto M.D.   On: 07/06/2021 13:45   CT CHEST WO CONTRAST  Result Date: 07/10/2021 CLINICAL DATA:  Follow-up lung nodule demonstrated on chest radiograph. EXAM: CT CHEST WITHOUT CONTRAST TECHNIQUE: Multidetector CT imaging of the chest was performed following the standard protocol without IV contrast. COMPARISON:  Chest radiograph 07/06/2021 FINDINGS: Cardiovascular: Mild cardiac enlargement. Coronary artery and aortic calcification. No aneurysm Mediastinum/Nodes: Moderate-sized esophageal hiatal hernia. Esophagus is decompressed. No significant lymphadenopathy. Heterogeneous nodular enlargement of the thyroid gland. Dominant nodule on the left measures 1.8 cm diameter and contains calcification. Elective ultrasound follow-up is recommended. Lungs/Pleura: Mild atelectasis in the lung  bases. Minimal subpleural fibrosis scattered in the lungs. Mild peripheral bronchiectasis. 3 mm nodule in the left upper lung is too low to represent the chest x-ray abnormality. This represents an incidental nodule. No focal nodules are demonstrated to account for the chest x-ray abnormality. Likely this represents summation of shadows or bony changes. No pleural effusion. No pneumothorax. Upper Abdomen: No acute abnormalities demonstrated in the visualized upper abdomen. Musculoskeletal: Degenerative changes in the spine and shoulders. No destructive bone lesions. IMPRESSION: 1. No focal pulmonary nodules demonstrated to account for the chest radiographic abnormality. This likely represented summation of shadows or bone structure. 2. 3 mm nodule in the left upper lung is demonstrated but this is not in the same location is the chest radiographic abnormality. No follow-up needed if patient is low-risk. Non-contrast chest CT can be considered in 12 months if patient is high-risk. This recommendation follows the consensus statement: Guidelines for Management of Incidental Pulmonary Nodules Detected on CT Images: From the Fleischner Society 2017; Radiology 2017; 284:228-243. 3. Heterogeneous calcified thyroid nodule measuring 1.8 cm diameter. Recommend thyroid US (ref: J Am Coll Radiol. 2015 Feb;12(2): 143-50). Electronically Signed   By: Lucienne Capers M.D.   On: 07/10/2021 16:33   DG Chest Port 1 View  Result Date: 07/06/2021 CLINICAL DATA:  Fever, cough EXAM: PORTABLE CHEST 1 VIEW COMPARISON:  Chest radiograph 04/17/2021 FINDINGS: Heart is at the upper limits of normal for size. The mediastinal contours are within normal limits. There is unchanged asymmetric elevation of the right hemidiaphragm. There is no focal consolidation or pulmonary edema. There is no pleural effusion or pneumothorax. There is a 1.0 cm nodular opacity in the left apex. There is no acute osseous abnormality. IMPRESSION: 1. No  radiographic evidence of acute cardiopulmonary process. 2. 1.0 cm nodule in the left apex. Recommend nonemergent CT chest for further evaluation. Electronically Signed   By: Valetta Mole M.D.   On: 07/06/2021 11:32   DG Abd Portable 1 View  Result Date: 07/06/2021 CLINICAL DATA:  Fever, cough EXAM: PORTABLE ABDOMEN - 1 VIEW COMPARISON:  KUB 05/11/2021 FINDINGS: There is mild gaseous distention of multiple loops of bowel throughout the abdomen without evidence of mechanical obstruction. There is a moderate to large colonic stool burden. There is no definite free intraperitoneal air, suboptimally evaluated given supine technique. There is no abnormal soft tissue calcification. There is no acute osseous abnormality. The imaged lung bases are clear. IMPRESSION: Moderate-to-large stool burden without evidence of mechanical obstruction. Electronically Signed   By: Valetta Mole M.D.   On: 07/06/2021 11:24   VAS [place] LOWER EXTREMITY VENOUS (DVT)  Result Date: 07/07/2021  Lower Venous DVT Study Patient Name:  Penny [name] Messing  Date of Exam:   07/06/2021 Medical Rec #: [id number]     Accession #:    [id number] Date of Birth: 13-Nov-1934     Patient Gender: F Patient Age:   4 years Exam Location:  [facility]  Procedure:      VAS [place] LOWER EXTREMITY VENOUS (DVT) Referring Phys: Anderson Malta YATES --------------------------------------------------------------------------------  Comparison Study: No previous exams Performing Technologist: Jody Hill RVT, RDMS  Examination Guidelines: A complete evaluation includes B-mode imaging, spectral Doppler, color Doppler, and power Doppler as needed of all accessible portions of each vessel. Bilateral testing is considered an integral part of a complete examination. Limited examinations for reoccurring indications may be performed as noted. The reflux portion of the exam is performed with the patient in reverse Trendelenburg.   +--------+---------------+---------+-----------+----------+--------------------+  RIGHT    Compressibility Phasicity Spontaneity Properties Thrombus Aging        +--------+---------------+---------+-----------+----------+--------------------+  CFV      Full                                                                   +--------+---------------+---------+-----------+----------+--------------------+  SFJ      Full                                                                   +--------+---------------+---------+-----------+----------+--------------------+  FV Prox  Full                                                                   +--------+---------------+---------+-----------+----------+--------------------+  FV Mid   Full                                                                   +--------+---------------+---------+-----------+----------+--------------------+  FV       Full                                                                    Distal                                                                          +--------+---------------+---------+-----------+----------+--------------------+  PFV      Full                                                                   +--------+---------------+---------+-----------+----------+--------------------+  POP                                                       patent by                                                                        color/doppler         +--------+---------------+---------+-----------+----------+--------------------+  PTV      Full                                                                   +--------+---------------+---------+-----------+----------+--------------------+  PERO     Full                                             Not well visualized   +--------+---------------+---------+-----------+----------+--------------------+ Limited visualization of popliteal vein and politeal fossa due to patient pain and  immobility.  +---------+---------------+---------+-----------+----------+-------------------+  LEFT      Compressibility Phasicity Spontaneity Properties Thrombus Aging       +---------+---------------+---------+-----------+----------+-------------------+  CFV       Full                                                                  +---------+---------------+---------+-----------+----------+-------------------+  SFJ       Full                                                                  +---------+---------------+---------+-----------+----------+-------------------+  FV Prox   Full                                                                  +---------+---------------+---------+-----------+----------+-------------------+  FV Mid    Full                                                                  +---------+---------------+---------+-----------+----------+-------------------+  FV Distal Full                                                                  +---------+---------------+---------+-----------+----------+-------------------+  PFV       Full                                                                  +---------+---------------+---------+-----------+----------+-------------------+  POP                                                        patent by color and                                                              doppler              +---------+---------------+---------+-----------+----------+-------------------+  PTV       Full                                                                  +---------+---------------+---------+-----------+----------+-------------------+  PERO      Full                                             Not well visualized  +---------+---------------+---------+-----------+----------+-------------------+ Limited visualization of popliteal vein and politeal fossa due to patient pain and immobility.    Summary: BILATERAL: - No evidence of deep vein  thrombosis seen in the lower extremities, bilaterally. - No evidence of superficial venous thrombosis in the lower extremities, bilaterally. -No evidence of popliteal cyst, bilaterally. RIGHT: - Ultrasound characteristics of enlarged lymph nodes are noted in the groin.  LEFT: - Ultrasound characteristics of enlarged lymph nodes noted in the groin.  *See table(s) above for measurements and observations. Electronically signed by Jamelle Haring on 07/07/2021 at 2:41:30 PM.    Final     Microbiology: Results for orders placed or performed during the hospital encounter of 07/06/21  Urine Culture     Status: Abnormal   Collection Time: 07/06/21 10:53 AM   Specimen: In/Out Cath Urine  Result Value Ref Range Status   Specimen Description IN/OUT CATH URINE  Final   Special Requests   Final    NONE Performed at [facility], [address], [place], [place] [address]    Culture (A)  Final    >=  100,000 COLONIES/mL KLEBSIELLA PNEUMONIAE Confirmed Extended Spectrum Beta-Lactamase Producer (ESBL).  In bloodstream infections from ESBL organisms, carbapenems are preferred over piperacillin/tazobactam. They are shown to have a lower risk of mortality.    Report Status 07/08/2021 FINAL  Final   Organism ID, Bacteria KLEBSIELLA PNEUMONIAE (A)  Final      Susceptibility   Klebsiella pneumoniae - MIC*    AMPICILLIN >=32 RESISTANT Resistant     CEFAZOLIN >=64 RESISTANT Resistant     CEFEPIME >=32 RESISTANT Resistant     CEFTRIAXONE >=64 RESISTANT Resistant     CIPROFLOXACIN 2 RESISTANT Resistant     GENTAMICIN >=16 RESISTANT Resistant     IMIPENEM <=0.25 SENSITIVE Sensitive     NITROFURANTOIN 128 RESISTANT Resistant     TRIMETH/SULFA >=320 RESISTANT Resistant     AMPICILLIN/SULBACTAM >=32 RESISTANT Resistant     PIP/TAZO 16 SENSITIVE Sensitive     * >=100,000 COLONIES/mL KLEBSIELLA PNEUMONIAE  Resp Panel by RT-PCR (Flu A&B, Covid) Nasopharyngeal Swab     Status: None   Collection Time: 07/06/21  11:02 AM   Specimen: Nasopharyngeal Swab; Nasopharyngeal(NP) swabs in vial transport medium  Result Value Ref Range Status   SARS Coronavirus 2 by RT PCR NEGATIVE NEGATIVE Final    Comment: (NOTE) SARS-CoV-2 target nucleic acids are NOT DETECTED.  The SARS-CoV-2 RNA is generally detectable in upper respiratory specimens during the acute phase of infection. The lowest concentration of SARS-CoV-2 viral copies this assay can detect is 138 copies/mL. A negative result does not preclude SARS-Cov-2 infection and should not be used as the sole basis for treatment or other patient management decisions. A negative result may occur with  improper specimen collection/handling, submission of specimen other than nasopharyngeal swab, presence of viral mutation(s) within the areas targeted by this assay, and inadequate number of viral copies(<138 copies/mL). A negative result must be combined with clinical observations, patient history, and epidemiological information. The expected result is Negative.  Fact Sheet for Patients:  EntrepreneurPulse.com.au  Fact Sheet for Healthcare Providers:  IncredibleEmployment.be  This test is no t yet approved or cleared by the [place] FDA and  has been authorized for detection and/or diagnosis of SARS-CoV-2 by FDA under an Emergency Use Authorization (EUA). This EUA will remain  in effect (meaning this test can be used) for the duration of the COVID-19 declaration under Section 564(b)(1) of the Act, 21 U.S.C.section 360bbb-3(b)(1), unless the authorization is terminated  or revoked sooner.       Influenza A by PCR NEGATIVE NEGATIVE Final   Influenza B by PCR NEGATIVE NEGATIVE Final    Comment: (NOTE) The Xpert Xpress SARS-CoV-2/FLU/RSV plus assay is intended as an aid in the diagnosis of influenza from Nasopharyngeal swab specimens and should not be used as a sole basis for treatment. Nasal washings and aspirates  are unacceptable for Xpert Xpress SARS-CoV-2/FLU/RSV testing.  Fact Sheet for Patients: EntrepreneurPulse.com.au  Fact Sheet for Healthcare Providers: IncredibleEmployment.be  This test is not yet approved or cleared by the [place] FDA and has been authorized for detection and/or diagnosis of SARS-CoV-2 by FDA under an Emergency Use Authorization (EUA). This EUA will remain in effect (meaning this test can be used) for the duration of the COVID-19 declaration under Section 564(b)(1) of the Act, 21 U.S.C. section 360bbb-3(b)(1), unless the authorization is terminated or revoked.  Performed at [facility], [place] [address], [place], [place] [address]   Blood Culture (routine x 2)     Status: None   Collection  Time: 07/06/21 11:15 AM   Specimen: BLOOD  Result Value Ref Range Status   Specimen Description BLOOD SITE NOT SPECIFIED  Final   Special Requests   Final    BOTTLES DRAWN AEROBIC AND ANAEROBIC Blood Culture adequate volume   Culture   Final    NO GROWTH 5 DAYS Performed at [facility], [address], [place], [place] [address]    Report Status 07/11/2021 FINAL  Final  Blood Culture (routine x 2)     Status: None   Collection Time: 07/06/21 11:21 AM   Specimen: BLOOD  Result Value Ref Range Status   Specimen Description BLOOD SITE NOT SPECIFIED  Final   Special Requests   Final    BOTTLES DRAWN AEROBIC AND ANAEROBIC Blood Culture results may not be optimal due to an inadequate volume of blood received in culture bottles   Culture   Final    NO GROWTH 5 DAYS Performed at [facility], [place] [address] [place],  [address]    Report Status 07/11/2021 FINAL  Final  Resp Panel by RT-PCR (Flu A&B, Covid) Nasopharyngeal Swab     Status: None   Collection Time: 07/11/21  5:34 AM   Specimen: Nasopharyngeal Swab; Nasopharyngeal(NP) swabs in vial transport medium  Result Value Ref Range Status   SARS Coronavirus  2 by RT PCR NEGATIVE NEGATIVE Final    Comment: (NOTE) SARS-CoV-2 target nucleic acids are NOT DETECTED.  The SARS-CoV-2 RNA is generally detectable in upper respiratory specimens during the acute phase of infection. The lowest concentration of SARS-CoV-2 viral copies this assay can detect is 138 copies/mL. A negative result does not preclude SARS-Cov-2 infection and should not be used as the sole basis for treatment or other patient management decisions. A negative result may occur with  improper specimen collection/handling, submission of specimen other than nasopharyngeal swab, presence of viral mutation(s) within the areas targeted by this assay, and inadequate number of viral copies(<138 copies/mL). A negative result must be combined with clinical observations, patient history, and epidemiological information. The expected result is Negative.  Fact Sheet for Patients:  EntrepreneurPulse.com.au  Fact Sheet for Healthcare Providers:  IncredibleEmployment.be  This test is no t yet approved or cleared by the [place] FDA and  has been authorized for detection and/or diagnosis of SARS-CoV-2 by FDA under an Emergency Use Authorization (EUA). This EUA will remain  in effect (meaning this test can be used) for the duration of the COVID-19 declaration under Section 564(b)(1) of the Act, 21 U.S.C.section 360bbb-3(b)(1), unless the authorization is terminated  or revoked sooner.       Influenza A by PCR NEGATIVE NEGATIVE Final   Influenza B by PCR NEGATIVE NEGATIVE Final    Comment: (NOTE) The Xpert Xpress SARS-CoV-2/FLU/RSV plus assay is intended as an aid in the diagnosis of influenza from Nasopharyngeal swab specimens and should not be used as a sole basis for treatment. Nasal washings and aspirates are unacceptable for Xpert Xpress SARS-CoV-2/FLU/RSV testing.  Fact Sheet for Patients: EntrepreneurPulse.com.au  Fact  Sheet for Healthcare Providers: IncredibleEmployment.be  This test is not yet approved or cleared by the [place] FDA and has been authorized for detection and/or diagnosis of SARS-CoV-2 by FDA under an Emergency Use Authorization (EUA). This EUA will remain in effect (meaning this test can be used) for the duration of the COVID-19 declaration under Section 564(b)(1) of the Act, 21 U.S.C. section 360bbb-3(b)(1), unless the authorization is terminated or revoked.  Performed at  [facility], [place] [address], [place], [place] [address]     Labs: CBC: Recent Labs  Lab 07/06/21 1053 07/07/21 0143 07/08/21 0150 07/10/21 0155  WBC 18.5* 21.3* 14.3* 9.0  NEUTROABS 17.8*  --   --   --   HGB 8.9* 8.2* 8.4* 8.0*  HCT 28.1* 26.0* 26.9* 25.9*  MCV 97.9 95.9 96.4 95.9  PLT 171 227 250 356   Basic Metabolic Panel: Recent Labs  Lab 07/06/21 1053 07/07/21 0143 07/08/21 0150 07/10/21 0155  NA 134* 135 134* 138  K 4.8 5.5* 4.4 4.9  CL 104 106 105 109  CO2 '22 24 23 25  ' GLUCOSE 245* 154* 147* 108*  BUN 85* 78* 63* 38*  CREATININE 1.69* 1.33* 1.05* 0.95  CALCIUM 8.6* 8.6* 8.5* 8.2*  MG  --   --  1.8  --   PHOS  --   --  2.8  --    Liver Function Tests: Recent Labs  Lab 07/06/21 1053 07/08/21 0150  AST 23  --   ALT 19  --   ALKPHOS 79  --   BILITOT 0.6  --   PROT 6.9  --   ALBUMIN 1.8* 1.7*   CBG: Recent Labs  Lab 07/10/21 0752 07/10/21 1225 07/10/21 1754 07/10/21 2037 07/11/21 0738  GLUCAP 118* 183* 158* 187* 128*    Discharge time spent: less than 30 minutes.  Signed:  Annita Brod MD.  [facility] 07/11/2021

## 2021-07-11 NOTE — Progress Notes (Signed)
Made several calls to facility to call report but all calls unanswered by the receiving unit. Will try again later

## 2021-07-11 NOTE — TOC Transition Note (Addendum)
Transition of Care [facility]) - CM/SW Discharge Note   Patient Details  Name: Penny [name] MRN: [id number] Date of Birth: 1935/03/10  [facility] CM/SW Contact:  Emeterio Reeve, LCSW Phone Number: 07/11/2021, 12:00 PM   Clinical Narrative:     Per MD patient ready for DC to [facility]. RN, patient, patient's family (csw LVM), and facility notified of DC. Discharge Summary and FL2 sent to facility. DC packet on chart. Insurance Josem Kaufmann has been received and pt is covid negative. Ambulance transport requested for patient.    RN to call report to [phone], room 217A.  CSW will sign off for now as social work intervention is no longer needed. Please consult [place] again if new needs arise.     Barriers to Discharge: Barriers Resolved   Patient Goals and CMS Choice        Discharge Placement              Patient chooses bed at: [facility] Patient to be transferred to facility by: [name] Name of family member notified: Lin Landsman Patient and family notified of of transfer: 07/11/21  Discharge Plan and Services     Post Acute Care Choice: [place]                               Social Determinants of Health (SDOH) Interventions     Readmission Risk Interventions No flowsheet data found.    Emeterio Reeve, LCSW Clinical Social Worker

## 2021-07-11 NOTE — Care Management Important Message (Signed)
Important Message  Patient Details  Name: Penny [name] MRN: [id number] Date of Birth: [birth year]   Medicare Important Message Given:  Yes     Hannah Beat 07/11/2021, 1:39 PM

## 2021-07-11 NOTE — Assessment & Plan Note (Signed)
Pressure Injury 04/19/21 Sacrum Stage 2 -  Partial thickness loss of dermis presenting as a shallow open injury with a red, pink wound bed without slough. (Active)  04/19/21 1710  Location: Sacrum (GLUTEAL FOLD)  Location Orientation:   Staging: Stage 2 -  Partial thickness loss of dermis presenting as a shallow open injury with a red, pink wound bed without slough.  Wound Description (Comments):   Present on Admission: Yes     Pressure Injury 04/19/21 Hip Right;Lateral Stage 4 - Full thickness tissue loss with exposed bone, tendon or muscle. (Active)  04/19/21 1714  Location: Hip  Location Orientation: Right;Lateral  Staging: Stage 4 - Full thickness tissue loss with exposed bone, tendon or muscle.  Wound Description (Comments):   Present on Admission: Yes     Pressure Injury 04/19/21 Knee Anterior;Right;Lateral Deep Tissue Pressure Injury - Purple or maroon localized area of discolored intact skin or blood-filled blister due to damage of underlying soft tissue from pressure and/or shear. (Active)  04/19/21 1716  Location: Knee  Location Orientation: Anterior;Right;Lateral  Staging: Deep Tissue Pressure Injury - Purple or maroon localized area of discolored intact skin or blood-filled blister due to damage of underlying soft tissue from pressure and/or shear.  Wound Description (Comments):   Present on Admission: Yes     Pressure Injury 07/10/21 Knee Right;Lateral Stage 3 -  Full thickness tissue loss. Subcutaneous fat may be visible but bone, tendon or muscle are NOT exposed. (Active)  07/10/21 1930  Location: Knee  Location Orientation: Right;Lateral  Staging: Stage 3 -  Full thickness tissue loss. Subcutaneous fat may be visible but bone, tendon or muscle are NOT exposed.  Wound Description (Comments):   Present on Admission: Yes     Pressure Injury 07/10/21 Hip Right Stage 3 -  Full thickness tissue loss. Subcutaneous fat may be visible but bone, tendon or muscle are NOT exposed.  (Active)  07/10/21 1930  Location: Hip  Location Orientation: Right  Staging: Stage 3 -  Full thickness tissue loss. Subcutaneous fat may be visible but bone, tendon or muscle are NOT exposed.  Wound Description (Comments):   Present on Admission: Yes

## 2021-07-12 DIAGNOSIS — L89214 Pressure ulcer of right hip, stage 4: Secondary | ICD-10-CM | POA: Diagnosis not present

## 2021-07-12 DIAGNOSIS — L89894 Pressure ulcer of other site, stage 4: Secondary | ICD-10-CM | POA: Diagnosis not present

## 2021-07-14 DIAGNOSIS — L89150 Pressure ulcer of sacral region, unstageable: Secondary | ICD-10-CM | POA: Diagnosis not present

## 2021-07-19 DIAGNOSIS — R946 Abnormal results of thyroid function studies: Secondary | ICD-10-CM | POA: Diagnosis not present

## 2021-07-19 DIAGNOSIS — L89894 Pressure ulcer of other site, stage 4: Secondary | ICD-10-CM | POA: Diagnosis not present

## 2021-07-19 DIAGNOSIS — E042 Nontoxic multinodular goiter: Secondary | ICD-10-CM | POA: Diagnosis not present

## 2021-07-20 DIAGNOSIS — I10 Essential (primary) hypertension: Secondary | ICD-10-CM | POA: Diagnosis not present

## 2021-07-20 DIAGNOSIS — E1022 Type 1 diabetes mellitus with diabetic chronic kidney disease: Secondary | ICD-10-CM | POA: Diagnosis not present

## 2021-07-20 DIAGNOSIS — M009 Pyogenic arthritis, unspecified: Secondary | ICD-10-CM | POA: Diagnosis not present

## 2021-07-20 DIAGNOSIS — D509 Iron deficiency anemia, unspecified: Secondary | ICD-10-CM | POA: Diagnosis not present

## 2021-07-25 DIAGNOSIS — L89150 Pressure ulcer of sacral region, unstageable: Secondary | ICD-10-CM | POA: Diagnosis not present

## 2021-07-26 DIAGNOSIS — L89150 Pressure ulcer of sacral region, unstageable: Secondary | ICD-10-CM | POA: Diagnosis not present

## 2021-07-31 DIAGNOSIS — E079 Disorder of thyroid, unspecified: Secondary | ICD-10-CM | POA: Diagnosis not present

## 2021-08-01 DIAGNOSIS — L89150 Pressure ulcer of sacral region, unstageable: Secondary | ICD-10-CM | POA: Diagnosis not present

## 2021-08-04 DIAGNOSIS — R7309 Other abnormal glucose: Secondary | ICD-10-CM | POA: Diagnosis not present

## 2021-08-04 DIAGNOSIS — Z13228 Encounter for screening for other metabolic disorders: Secondary | ICD-10-CM | POA: Diagnosis not present

## 2021-08-04 DIAGNOSIS — Z130 Encounter for screening for diseases of the blood and blood-forming organs and certain disorders involving the immune mechanism: Secondary | ICD-10-CM | POA: Diagnosis not present

## 2021-08-04 DIAGNOSIS — E039 Hypothyroidism, unspecified: Secondary | ICD-10-CM | POA: Diagnosis not present

## 2021-08-07 ENCOUNTER — Encounter (HOSPITAL_COMMUNITY): Payer: Self-pay | Admitting: Radiology

## 2021-08-07 DIAGNOSIS — L89894 Pressure ulcer of other site, stage 4: Secondary | ICD-10-CM | POA: Diagnosis not present

## 2021-08-07 DIAGNOSIS — L89214 Pressure ulcer of right hip, stage 4: Secondary | ICD-10-CM | POA: Diagnosis not present

## 2021-08-07 DIAGNOSIS — L89150 Pressure ulcer of sacral region, unstageable: Secondary | ICD-10-CM | POA: Diagnosis not present

## 2021-08-09 DIAGNOSIS — L89214 Pressure ulcer of right hip, stage 4: Secondary | ICD-10-CM | POA: Diagnosis not present

## 2021-08-09 DIAGNOSIS — E1159 Type 2 diabetes mellitus with other circulatory complications: Secondary | ICD-10-CM | POA: Diagnosis not present

## 2021-08-09 DIAGNOSIS — Z6821 Body mass index (BMI) 21.0-21.9, adult: Secondary | ICD-10-CM | POA: Diagnosis not present

## 2021-08-09 DIAGNOSIS — E0789 Other specified disorders of thyroid: Secondary | ICD-10-CM | POA: Diagnosis not present

## 2021-08-09 DIAGNOSIS — I739 Peripheral vascular disease, unspecified: Secondary | ICD-10-CM | POA: Diagnosis not present

## 2021-08-09 DIAGNOSIS — N1831 Chronic kidney disease, stage 3a: Secondary | ICD-10-CM | POA: Diagnosis not present

## 2021-08-09 DIAGNOSIS — G3184 Mild cognitive impairment, so stated: Secondary | ICD-10-CM | POA: Diagnosis not present

## 2021-08-14 DIAGNOSIS — L89153 Pressure ulcer of sacral region, stage 3: Secondary | ICD-10-CM | POA: Diagnosis not present

## 2021-08-14 DIAGNOSIS — L89894 Pressure ulcer of other site, stage 4: Secondary | ICD-10-CM | POA: Diagnosis not present

## 2021-08-14 DIAGNOSIS — L89214 Pressure ulcer of right hip, stage 4: Secondary | ICD-10-CM | POA: Diagnosis not present

## 2021-08-16 DIAGNOSIS — L509 Urticaria, unspecified: Secondary | ICD-10-CM | POA: Diagnosis not present

## 2021-08-16 DIAGNOSIS — L299 Pruritus, unspecified: Secondary | ICD-10-CM | POA: Diagnosis not present

## 2021-08-21 DIAGNOSIS — E1159 Type 2 diabetes mellitus with other circulatory complications: Secondary | ICD-10-CM | POA: Diagnosis not present

## 2021-08-21 DIAGNOSIS — L89153 Pressure ulcer of sacral region, stage 3: Secondary | ICD-10-CM | POA: Diagnosis not present

## 2021-08-21 DIAGNOSIS — L89214 Pressure ulcer of right hip, stage 4: Secondary | ICD-10-CM | POA: Diagnosis not present

## 2021-08-21 DIAGNOSIS — E0590 Thyrotoxicosis, unspecified without thyrotoxic crisis or storm: Secondary | ICD-10-CM | POA: Diagnosis not present

## 2021-08-21 DIAGNOSIS — L89894 Pressure ulcer of other site, stage 4: Secondary | ICD-10-CM | POA: Diagnosis not present

## 2021-08-22 DIAGNOSIS — L89894 Pressure ulcer of other site, stage 4: Secondary | ICD-10-CM | POA: Diagnosis not present

## 2021-08-22 DIAGNOSIS — L89150 Pressure ulcer of sacral region, unstageable: Secondary | ICD-10-CM | POA: Diagnosis not present

## 2021-08-23 DIAGNOSIS — E1159 Type 2 diabetes mellitus with other circulatory complications: Secondary | ICD-10-CM | POA: Diagnosis not present

## 2021-08-23 DIAGNOSIS — R946 Abnormal results of thyroid function studies: Secondary | ICD-10-CM | POA: Diagnosis not present

## 2021-08-23 DIAGNOSIS — D508 Other iron deficiency anemias: Secondary | ICD-10-CM | POA: Diagnosis not present

## 2021-08-23 DIAGNOSIS — E0789 Other specified disorders of thyroid: Secondary | ICD-10-CM | POA: Diagnosis not present

## 2021-08-28 DIAGNOSIS — E1159 Type 2 diabetes mellitus with other circulatory complications: Secondary | ICD-10-CM | POA: Diagnosis not present

## 2021-08-28 DIAGNOSIS — L89894 Pressure ulcer of other site, stage 4: Secondary | ICD-10-CM | POA: Diagnosis not present

## 2021-08-29 DIAGNOSIS — L603 Nail dystrophy: Secondary | ICD-10-CM | POA: Diagnosis not present

## 2021-08-29 DIAGNOSIS — L853 Xerosis cutis: Secondary | ICD-10-CM | POA: Diagnosis not present

## 2021-08-29 DIAGNOSIS — B351 Tinea unguium: Secondary | ICD-10-CM | POA: Diagnosis not present

## 2021-08-29 DIAGNOSIS — E042 Nontoxic multinodular goiter: Secondary | ICD-10-CM | POA: Diagnosis not present

## 2021-08-29 DIAGNOSIS — R946 Abnormal results of thyroid function studies: Secondary | ICD-10-CM | POA: Diagnosis not present

## 2021-08-29 DIAGNOSIS — E1165 Type 2 diabetes mellitus with hyperglycemia: Secondary | ICD-10-CM | POA: Diagnosis not present

## 2021-08-29 DIAGNOSIS — D649 Anemia, unspecified: Secondary | ICD-10-CM | POA: Diagnosis not present

## 2021-08-29 DIAGNOSIS — E1151 Type 2 diabetes mellitus with diabetic peripheral angiopathy without gangrene: Secondary | ICD-10-CM | POA: Diagnosis not present

## 2021-08-29 DIAGNOSIS — Z7984 Long term (current) use of oral hypoglycemic drugs: Secondary | ICD-10-CM | POA: Diagnosis not present

## 2021-09-04 ENCOUNTER — Other Ambulatory Visit: Payer: Self-pay

## 2021-09-04 ENCOUNTER — Encounter (INDEPENDENT_AMBULATORY_CARE_PROVIDER_SITE_OTHER): Payer: Medicare Other | Admitting: Ophthalmology

## 2021-09-04 DIAGNOSIS — H35033 Hypertensive retinopathy, bilateral: Secondary | ICD-10-CM

## 2021-09-04 DIAGNOSIS — I10 Essential (primary) hypertension: Secondary | ICD-10-CM

## 2021-09-04 DIAGNOSIS — H43813 Vitreous degeneration, bilateral: Secondary | ICD-10-CM | POA: Diagnosis not present

## 2021-09-04 DIAGNOSIS — E113392 Type 2 diabetes mellitus with moderate nonproliferative diabetic retinopathy without macular edema, left eye: Secondary | ICD-10-CM

## 2021-09-04 DIAGNOSIS — E113291 Type 2 diabetes mellitus with mild nonproliferative diabetic retinopathy without macular edema, right eye: Secondary | ICD-10-CM

## 2021-09-04 DIAGNOSIS — H35373 Puckering of macula, bilateral: Secondary | ICD-10-CM | POA: Diagnosis not present

## 2021-09-08 DIAGNOSIS — B86 Scabies: Secondary | ICD-10-CM | POA: Diagnosis not present

## 2021-09-08 DIAGNOSIS — D1724 Benign lipomatous neoplasm of skin and subcutaneous tissue of left leg: Secondary | ICD-10-CM | POA: Diagnosis not present

## 2021-09-08 DIAGNOSIS — L299 Pruritus, unspecified: Secondary | ICD-10-CM | POA: Diagnosis not present

## 2021-09-25 DIAGNOSIS — I739 Peripheral vascular disease, unspecified: Secondary | ICD-10-CM | POA: Diagnosis not present

## 2021-09-25 DIAGNOSIS — I10 Essential (primary) hypertension: Secondary | ICD-10-CM | POA: Diagnosis not present

## 2021-09-25 DIAGNOSIS — M5134 Other intervertebral disc degeneration, thoracic region: Secondary | ICD-10-CM | POA: Diagnosis not present

## 2021-09-25 DIAGNOSIS — M160 Bilateral primary osteoarthritis of hip: Secondary | ICD-10-CM | POA: Diagnosis not present

## 2021-09-25 DIAGNOSIS — I129 Hypertensive chronic kidney disease with stage 1 through stage 4 chronic kidney disease, or unspecified chronic kidney disease: Secondary | ICD-10-CM | POA: Diagnosis not present

## 2021-09-25 DIAGNOSIS — N1831 Chronic kidney disease, stage 3a: Secondary | ICD-10-CM | POA: Diagnosis not present

## 2021-09-25 DIAGNOSIS — N390 Urinary tract infection, site not specified: Secondary | ICD-10-CM | POA: Diagnosis not present

## 2021-09-25 DIAGNOSIS — M6281 Muscle weakness (generalized): Secondary | ICD-10-CM | POA: Diagnosis not present

## 2021-09-25 DIAGNOSIS — E079 Disorder of thyroid, unspecified: Secondary | ICD-10-CM | POA: Diagnosis not present

## 2021-09-25 DIAGNOSIS — Z8673 Personal history of transient ischemic attack (TIA), and cerebral infarction without residual deficits: Secondary | ICD-10-CM | POA: Diagnosis not present

## 2021-09-25 DIAGNOSIS — M170 Bilateral primary osteoarthritis of knee: Secondary | ICD-10-CM | POA: Diagnosis not present

## 2021-09-25 DIAGNOSIS — G3184 Mild cognitive impairment, so stated: Secondary | ICD-10-CM | POA: Diagnosis not present

## 2021-09-27 DIAGNOSIS — N1831 Chronic kidney disease, stage 3a: Secondary | ICD-10-CM | POA: Diagnosis not present

## 2021-09-27 DIAGNOSIS — I129 Hypertensive chronic kidney disease with stage 1 through stage 4 chronic kidney disease, or unspecified chronic kidney disease: Secondary | ICD-10-CM | POA: Diagnosis not present

## 2021-09-27 DIAGNOSIS — G3184 Mild cognitive impairment, so stated: Secondary | ICD-10-CM | POA: Diagnosis not present

## 2021-09-27 DIAGNOSIS — R319 Hematuria, unspecified: Secondary | ICD-10-CM | POA: Diagnosis not present

## 2021-09-27 DIAGNOSIS — N390 Urinary tract infection, site not specified: Secondary | ICD-10-CM | POA: Diagnosis not present

## 2021-09-27 DIAGNOSIS — M6281 Muscle weakness (generalized): Secondary | ICD-10-CM | POA: Diagnosis not present

## 2021-10-10 DIAGNOSIS — I10 Essential (primary) hypertension: Secondary | ICD-10-CM | POA: Diagnosis not present

## 2021-10-10 DIAGNOSIS — M6281 Muscle weakness (generalized): Secondary | ICD-10-CM | POA: Diagnosis not present

## 2021-10-10 DIAGNOSIS — N1831 Chronic kidney disease, stage 3a: Secondary | ICD-10-CM | POA: Diagnosis not present

## 2021-10-10 DIAGNOSIS — G3184 Mild cognitive impairment, so stated: Secondary | ICD-10-CM | POA: Diagnosis not present

## 2021-10-10 DIAGNOSIS — M170 Bilateral primary osteoarthritis of knee: Secondary | ICD-10-CM | POA: Diagnosis not present

## 2021-10-10 DIAGNOSIS — M160 Bilateral primary osteoarthritis of hip: Secondary | ICD-10-CM | POA: Diagnosis not present

## 2021-10-10 DIAGNOSIS — E079 Disorder of thyroid, unspecified: Secondary | ICD-10-CM | POA: Diagnosis not present

## 2021-10-10 DIAGNOSIS — R682 Dry mouth, unspecified: Secondary | ICD-10-CM | POA: Diagnosis not present

## 2021-10-10 DIAGNOSIS — R770 Abnormality of albumin: Secondary | ICD-10-CM | POA: Diagnosis not present

## 2021-10-10 DIAGNOSIS — I129 Hypertensive chronic kidney disease with stage 1 through stage 4 chronic kidney disease, or unspecified chronic kidney disease: Secondary | ICD-10-CM | POA: Diagnosis not present

## 2021-10-10 DIAGNOSIS — E1159 Type 2 diabetes mellitus with other circulatory complications: Secondary | ICD-10-CM | POA: Diagnosis not present

## 2021-10-11 DIAGNOSIS — M6281 Muscle weakness (generalized): Secondary | ICD-10-CM | POA: Diagnosis not present

## 2021-10-11 DIAGNOSIS — M5134 Other intervertebral disc degeneration, thoracic region: Secondary | ICD-10-CM | POA: Diagnosis not present

## 2021-10-11 DIAGNOSIS — N1831 Chronic kidney disease, stage 3a: Secondary | ICD-10-CM | POA: Diagnosis not present

## 2021-10-11 DIAGNOSIS — Z8673 Personal history of transient ischemic attack (TIA), and cerebral infarction without residual deficits: Secondary | ICD-10-CM | POA: Diagnosis not present

## 2021-10-11 DIAGNOSIS — I739 Peripheral vascular disease, unspecified: Secondary | ICD-10-CM | POA: Diagnosis not present

## 2021-10-11 DIAGNOSIS — I129 Hypertensive chronic kidney disease with stage 1 through stage 4 chronic kidney disease, or unspecified chronic kidney disease: Secondary | ICD-10-CM | POA: Diagnosis not present

## 2021-10-11 DIAGNOSIS — E079 Disorder of thyroid, unspecified: Secondary | ICD-10-CM | POA: Diagnosis not present

## 2021-10-11 DIAGNOSIS — I10 Essential (primary) hypertension: Secondary | ICD-10-CM | POA: Diagnosis not present

## 2021-10-11 DIAGNOSIS — G3184 Mild cognitive impairment, so stated: Secondary | ICD-10-CM | POA: Diagnosis not present

## 2021-10-11 DIAGNOSIS — M160 Bilateral primary osteoarthritis of hip: Secondary | ICD-10-CM | POA: Diagnosis not present

## 2021-10-11 DIAGNOSIS — M170 Bilateral primary osteoarthritis of knee: Secondary | ICD-10-CM | POA: Diagnosis not present

## 2021-10-12 DIAGNOSIS — E43 Unspecified severe protein-calorie malnutrition: Secondary | ICD-10-CM | POA: Diagnosis not present

## 2021-10-12 DIAGNOSIS — M170 Bilateral primary osteoarthritis of knee: Secondary | ICD-10-CM | POA: Diagnosis not present

## 2021-10-12 DIAGNOSIS — E1159 Type 2 diabetes mellitus with other circulatory complications: Secondary | ICD-10-CM | POA: Diagnosis not present

## 2021-10-18 DIAGNOSIS — M160 Bilateral primary osteoarthritis of hip: Secondary | ICD-10-CM | POA: Diagnosis not present

## 2021-10-18 DIAGNOSIS — E079 Disorder of thyroid, unspecified: Secondary | ICD-10-CM | POA: Diagnosis not present

## 2021-10-18 DIAGNOSIS — L509 Urticaria, unspecified: Secondary | ICD-10-CM | POA: Diagnosis not present

## 2021-10-18 DIAGNOSIS — M170 Bilateral primary osteoarthritis of knee: Secondary | ICD-10-CM | POA: Diagnosis not present

## 2021-10-18 DIAGNOSIS — I10 Essential (primary) hypertension: Secondary | ICD-10-CM | POA: Diagnosis not present

## 2021-10-18 DIAGNOSIS — M5134 Other intervertebral disc degeneration, thoracic region: Secondary | ICD-10-CM | POA: Diagnosis not present

## 2021-10-18 DIAGNOSIS — I129 Hypertensive chronic kidney disease with stage 1 through stage 4 chronic kidney disease, or unspecified chronic kidney disease: Secondary | ICD-10-CM | POA: Diagnosis not present

## 2021-10-18 DIAGNOSIS — M6281 Muscle weakness (generalized): Secondary | ICD-10-CM | POA: Diagnosis not present

## 2021-10-18 DIAGNOSIS — N1831 Chronic kidney disease, stage 3a: Secondary | ICD-10-CM | POA: Diagnosis not present

## 2021-10-18 DIAGNOSIS — G3184 Mild cognitive impairment, so stated: Secondary | ICD-10-CM | POA: Diagnosis not present

## 2021-10-18 DIAGNOSIS — I739 Peripheral vascular disease, unspecified: Secondary | ICD-10-CM | POA: Diagnosis not present

## 2021-10-24 DIAGNOSIS — I129 Hypertensive chronic kidney disease with stage 1 through stage 4 chronic kidney disease, or unspecified chronic kidney disease: Secondary | ICD-10-CM | POA: Diagnosis not present

## 2021-10-24 DIAGNOSIS — M5134 Other intervertebral disc degeneration, thoracic region: Secondary | ICD-10-CM | POA: Diagnosis not present

## 2021-10-24 DIAGNOSIS — G3184 Mild cognitive impairment, so stated: Secondary | ICD-10-CM | POA: Diagnosis not present

## 2021-10-24 DIAGNOSIS — N1831 Chronic kidney disease, stage 3a: Secondary | ICD-10-CM | POA: Diagnosis not present

## 2021-10-24 DIAGNOSIS — E079 Disorder of thyroid, unspecified: Secondary | ICD-10-CM | POA: Diagnosis not present

## 2021-10-24 DIAGNOSIS — M160 Bilateral primary osteoarthritis of hip: Secondary | ICD-10-CM | POA: Diagnosis not present

## 2021-10-24 DIAGNOSIS — Z8673 Personal history of transient ischemic attack (TIA), and cerebral infarction without residual deficits: Secondary | ICD-10-CM | POA: Diagnosis not present

## 2021-10-24 DIAGNOSIS — M6281 Muscle weakness (generalized): Secondary | ICD-10-CM | POA: Diagnosis not present

## 2021-10-24 DIAGNOSIS — E1159 Type 2 diabetes mellitus with other circulatory complications: Secondary | ICD-10-CM | POA: Diagnosis not present

## 2021-10-24 DIAGNOSIS — I739 Peripheral vascular disease, unspecified: Secondary | ICD-10-CM | POA: Diagnosis not present

## 2021-10-24 DIAGNOSIS — I10 Essential (primary) hypertension: Secondary | ICD-10-CM | POA: Diagnosis not present

## 2021-10-25 DIAGNOSIS — M160 Bilateral primary osteoarthritis of hip: Secondary | ICD-10-CM | POA: Diagnosis not present

## 2021-10-25 DIAGNOSIS — I739 Peripheral vascular disease, unspecified: Secondary | ICD-10-CM | POA: Diagnosis not present

## 2021-10-25 DIAGNOSIS — I10 Essential (primary) hypertension: Secondary | ICD-10-CM | POA: Diagnosis not present

## 2021-10-25 DIAGNOSIS — M6281 Muscle weakness (generalized): Secondary | ICD-10-CM | POA: Diagnosis not present

## 2021-10-25 DIAGNOSIS — N1831 Chronic kidney disease, stage 3a: Secondary | ICD-10-CM | POA: Diagnosis not present

## 2021-10-25 DIAGNOSIS — E1159 Type 2 diabetes mellitus with other circulatory complications: Secondary | ICD-10-CM | POA: Diagnosis not present

## 2021-10-25 DIAGNOSIS — Z961 Presence of intraocular lens: Secondary | ICD-10-CM | POA: Diagnosis not present

## 2021-10-25 DIAGNOSIS — H524 Presbyopia: Secondary | ICD-10-CM | POA: Diagnosis not present

## 2021-10-25 DIAGNOSIS — E119 Type 2 diabetes mellitus without complications: Secondary | ICD-10-CM | POA: Diagnosis not present

## 2021-10-25 DIAGNOSIS — E079 Disorder of thyroid, unspecified: Secondary | ICD-10-CM | POA: Diagnosis not present

## 2021-10-25 DIAGNOSIS — M170 Bilateral primary osteoarthritis of knee: Secondary | ICD-10-CM | POA: Diagnosis not present

## 2021-10-30 DIAGNOSIS — G3184 Mild cognitive impairment, so stated: Secondary | ICD-10-CM | POA: Diagnosis not present

## 2021-10-30 DIAGNOSIS — M160 Bilateral primary osteoarthritis of hip: Secondary | ICD-10-CM | POA: Diagnosis not present

## 2021-10-30 DIAGNOSIS — I10 Essential (primary) hypertension: Secondary | ICD-10-CM | POA: Diagnosis not present

## 2021-10-30 DIAGNOSIS — I129 Hypertensive chronic kidney disease with stage 1 through stage 4 chronic kidney disease, or unspecified chronic kidney disease: Secondary | ICD-10-CM | POA: Diagnosis not present

## 2021-10-30 DIAGNOSIS — M170 Bilateral primary osteoarthritis of knee: Secondary | ICD-10-CM | POA: Diagnosis not present

## 2021-10-30 DIAGNOSIS — M6281 Muscle weakness (generalized): Secondary | ICD-10-CM | POA: Diagnosis not present

## 2021-10-30 DIAGNOSIS — E079 Disorder of thyroid, unspecified: Secondary | ICD-10-CM | POA: Diagnosis not present

## 2021-10-30 DIAGNOSIS — M5134 Other intervertebral disc degeneration, thoracic region: Secondary | ICD-10-CM | POA: Diagnosis not present

## 2021-10-30 DIAGNOSIS — Z8673 Personal history of transient ischemic attack (TIA), and cerebral infarction without residual deficits: Secondary | ICD-10-CM | POA: Diagnosis not present

## 2021-10-30 DIAGNOSIS — N1831 Chronic kidney disease, stage 3a: Secondary | ICD-10-CM | POA: Diagnosis not present

## 2021-10-30 DIAGNOSIS — I739 Peripheral vascular disease, unspecified: Secondary | ICD-10-CM | POA: Diagnosis not present

## 2021-10-31 DIAGNOSIS — Z8673 Personal history of transient ischemic attack (TIA), and cerebral infarction without residual deficits: Secondary | ICD-10-CM | POA: Diagnosis not present

## 2021-10-31 DIAGNOSIS — M6281 Muscle weakness (generalized): Secondary | ICD-10-CM | POA: Diagnosis not present

## 2021-10-31 DIAGNOSIS — Z1159 Encounter for screening for other viral diseases: Secondary | ICD-10-CM | POA: Diagnosis not present

## 2021-11-01 DIAGNOSIS — M6281 Muscle weakness (generalized): Secondary | ICD-10-CM | POA: Diagnosis not present

## 2021-11-01 DIAGNOSIS — Z8673 Personal history of transient ischemic attack (TIA), and cerebral infarction without residual deficits: Secondary | ICD-10-CM | POA: Diagnosis not present

## 2021-11-01 DIAGNOSIS — Z1159 Encounter for screening for other viral diseases: Secondary | ICD-10-CM | POA: Diagnosis not present

## 2021-11-02 DIAGNOSIS — Z8673 Personal history of transient ischemic attack (TIA), and cerebral infarction without residual deficits: Secondary | ICD-10-CM | POA: Diagnosis not present

## 2021-11-02 DIAGNOSIS — M6281 Muscle weakness (generalized): Secondary | ICD-10-CM | POA: Diagnosis not present

## 2021-11-02 DIAGNOSIS — Z1159 Encounter for screening for other viral diseases: Secondary | ICD-10-CM | POA: Diagnosis not present

## 2021-11-03 DIAGNOSIS — Z8673 Personal history of transient ischemic attack (TIA), and cerebral infarction without residual deficits: Secondary | ICD-10-CM | POA: Diagnosis not present

## 2021-11-03 DIAGNOSIS — Z1159 Encounter for screening for other viral diseases: Secondary | ICD-10-CM | POA: Diagnosis not present

## 2021-11-03 DIAGNOSIS — M6281 Muscle weakness (generalized): Secondary | ICD-10-CM | POA: Diagnosis not present

## 2021-11-05 DIAGNOSIS — M6281 Muscle weakness (generalized): Secondary | ICD-10-CM | POA: Diagnosis not present

## 2021-11-05 DIAGNOSIS — Z1159 Encounter for screening for other viral diseases: Secondary | ICD-10-CM | POA: Diagnosis not present

## 2021-11-05 DIAGNOSIS — Z8673 Personal history of transient ischemic attack (TIA), and cerebral infarction without residual deficits: Secondary | ICD-10-CM | POA: Diagnosis not present

## 2021-11-07 DIAGNOSIS — M6281 Muscle weakness (generalized): Secondary | ICD-10-CM | POA: Diagnosis not present

## 2021-11-07 DIAGNOSIS — M170 Bilateral primary osteoarthritis of knee: Secondary | ICD-10-CM | POA: Diagnosis not present

## 2021-11-07 DIAGNOSIS — L98411 Non-pressure chronic ulcer of buttock limited to breakdown of skin: Secondary | ICD-10-CM | POA: Diagnosis not present

## 2021-11-07 DIAGNOSIS — Z8673 Personal history of transient ischemic attack (TIA), and cerebral infarction without residual deficits: Secondary | ICD-10-CM | POA: Diagnosis not present

## 2021-11-07 DIAGNOSIS — E1159 Type 2 diabetes mellitus with other circulatory complications: Secondary | ICD-10-CM | POA: Diagnosis not present

## 2021-11-07 DIAGNOSIS — I739 Peripheral vascular disease, unspecified: Secondary | ICD-10-CM | POA: Diagnosis not present

## 2021-11-07 DIAGNOSIS — Z1159 Encounter for screening for other viral diseases: Secondary | ICD-10-CM | POA: Diagnosis not present

## 2021-11-08 DIAGNOSIS — Z8673 Personal history of transient ischemic attack (TIA), and cerebral infarction without residual deficits: Secondary | ICD-10-CM | POA: Diagnosis not present

## 2021-11-08 DIAGNOSIS — Z1159 Encounter for screening for other viral diseases: Secondary | ICD-10-CM | POA: Diagnosis not present

## 2021-11-08 DIAGNOSIS — M6281 Muscle weakness (generalized): Secondary | ICD-10-CM | POA: Diagnosis not present

## 2021-11-09 DIAGNOSIS — E079 Disorder of thyroid, unspecified: Secondary | ICD-10-CM | POA: Diagnosis not present

## 2021-11-09 DIAGNOSIS — Z1159 Encounter for screening for other viral diseases: Secondary | ICD-10-CM | POA: Diagnosis not present

## 2021-11-09 DIAGNOSIS — G3184 Mild cognitive impairment, so stated: Secondary | ICD-10-CM | POA: Diagnosis not present

## 2021-11-09 DIAGNOSIS — I10 Essential (primary) hypertension: Secondary | ICD-10-CM | POA: Diagnosis not present

## 2021-11-09 DIAGNOSIS — M170 Bilateral primary osteoarthritis of knee: Secondary | ICD-10-CM | POA: Diagnosis not present

## 2021-11-09 DIAGNOSIS — M5134 Other intervertebral disc degeneration, thoracic region: Secondary | ICD-10-CM | POA: Diagnosis not present

## 2021-11-09 DIAGNOSIS — I739 Peripheral vascular disease, unspecified: Secondary | ICD-10-CM | POA: Diagnosis not present

## 2021-11-09 DIAGNOSIS — I129 Hypertensive chronic kidney disease with stage 1 through stage 4 chronic kidney disease, or unspecified chronic kidney disease: Secondary | ICD-10-CM | POA: Diagnosis not present

## 2021-11-09 DIAGNOSIS — L509 Urticaria, unspecified: Secondary | ICD-10-CM | POA: Diagnosis not present

## 2021-11-09 DIAGNOSIS — N1831 Chronic kidney disease, stage 3a: Secondary | ICD-10-CM | POA: Diagnosis not present

## 2021-11-09 DIAGNOSIS — M6281 Muscle weakness (generalized): Secondary | ICD-10-CM | POA: Diagnosis not present

## 2021-11-09 DIAGNOSIS — Z8673 Personal history of transient ischemic attack (TIA), and cerebral infarction without residual deficits: Secondary | ICD-10-CM | POA: Diagnosis not present

## 2021-11-09 DIAGNOSIS — M160 Bilateral primary osteoarthritis of hip: Secondary | ICD-10-CM | POA: Diagnosis not present

## 2021-11-10 DIAGNOSIS — Z1159 Encounter for screening for other viral diseases: Secondary | ICD-10-CM | POA: Diagnosis not present

## 2021-11-10 DIAGNOSIS — M6281 Muscle weakness (generalized): Secondary | ICD-10-CM | POA: Diagnosis not present

## 2021-11-10 DIAGNOSIS — Z8673 Personal history of transient ischemic attack (TIA), and cerebral infarction without residual deficits: Secondary | ICD-10-CM | POA: Diagnosis not present

## 2021-11-12 DIAGNOSIS — Z1159 Encounter for screening for other viral diseases: Secondary | ICD-10-CM | POA: Diagnosis not present

## 2021-11-12 DIAGNOSIS — M6281 Muscle weakness (generalized): Secondary | ICD-10-CM | POA: Diagnosis not present

## 2021-11-12 DIAGNOSIS — Z8673 Personal history of transient ischemic attack (TIA), and cerebral infarction without residual deficits: Secondary | ICD-10-CM | POA: Diagnosis not present

## 2021-11-13 DIAGNOSIS — M170 Bilateral primary osteoarthritis of knee: Secondary | ICD-10-CM | POA: Diagnosis not present

## 2021-11-13 DIAGNOSIS — L98411 Non-pressure chronic ulcer of buttock limited to breakdown of skin: Secondary | ICD-10-CM | POA: Diagnosis not present

## 2021-11-13 DIAGNOSIS — E1159 Type 2 diabetes mellitus with other circulatory complications: Secondary | ICD-10-CM | POA: Diagnosis not present

## 2021-11-13 DIAGNOSIS — I739 Peripheral vascular disease, unspecified: Secondary | ICD-10-CM | POA: Diagnosis not present

## 2021-11-14 DIAGNOSIS — Z8673 Personal history of transient ischemic attack (TIA), and cerebral infarction without residual deficits: Secondary | ICD-10-CM | POA: Diagnosis not present

## 2021-11-14 DIAGNOSIS — M6281 Muscle weakness (generalized): Secondary | ICD-10-CM | POA: Diagnosis not present

## 2021-11-14 DIAGNOSIS — I739 Peripheral vascular disease, unspecified: Secondary | ICD-10-CM | POA: Diagnosis not present

## 2021-11-14 DIAGNOSIS — E1159 Type 2 diabetes mellitus with other circulatory complications: Secondary | ICD-10-CM | POA: Diagnosis not present

## 2021-11-14 DIAGNOSIS — M170 Bilateral primary osteoarthritis of knee: Secondary | ICD-10-CM | POA: Diagnosis not present

## 2021-11-14 DIAGNOSIS — I10 Essential (primary) hypertension: Secondary | ICD-10-CM | POA: Diagnosis not present

## 2021-11-14 DIAGNOSIS — E079 Disorder of thyroid, unspecified: Secondary | ICD-10-CM | POA: Diagnosis not present

## 2021-11-14 DIAGNOSIS — M5134 Other intervertebral disc degeneration, thoracic region: Secondary | ICD-10-CM | POA: Diagnosis not present

## 2021-11-14 DIAGNOSIS — G3184 Mild cognitive impairment, so stated: Secondary | ICD-10-CM | POA: Diagnosis not present

## 2021-11-14 DIAGNOSIS — I129 Hypertensive chronic kidney disease with stage 1 through stage 4 chronic kidney disease, or unspecified chronic kidney disease: Secondary | ICD-10-CM | POA: Diagnosis not present

## 2021-11-14 DIAGNOSIS — N1831 Chronic kidney disease, stage 3a: Secondary | ICD-10-CM | POA: Diagnosis not present

## 2021-11-15 DIAGNOSIS — I739 Peripheral vascular disease, unspecified: Secondary | ICD-10-CM | POA: Diagnosis not present

## 2021-11-15 DIAGNOSIS — N1831 Chronic kidney disease, stage 3a: Secondary | ICD-10-CM | POA: Diagnosis not present

## 2021-11-15 DIAGNOSIS — M160 Bilateral primary osteoarthritis of hip: Secondary | ICD-10-CM | POA: Diagnosis not present

## 2021-11-15 DIAGNOSIS — I10 Essential (primary) hypertension: Secondary | ICD-10-CM | POA: Diagnosis not present

## 2021-11-15 DIAGNOSIS — G3184 Mild cognitive impairment, so stated: Secondary | ICD-10-CM | POA: Diagnosis not present

## 2021-11-15 DIAGNOSIS — E1159 Type 2 diabetes mellitus with other circulatory complications: Secondary | ICD-10-CM | POA: Diagnosis not present

## 2021-11-15 DIAGNOSIS — M5134 Other intervertebral disc degeneration, thoracic region: Secondary | ICD-10-CM | POA: Diagnosis not present

## 2021-11-15 DIAGNOSIS — M6281 Muscle weakness (generalized): Secondary | ICD-10-CM | POA: Diagnosis not present

## 2021-11-15 DIAGNOSIS — E079 Disorder of thyroid, unspecified: Secondary | ICD-10-CM | POA: Diagnosis not present

## 2021-11-15 DIAGNOSIS — Z8673 Personal history of transient ischemic attack (TIA), and cerebral infarction without residual deficits: Secondary | ICD-10-CM | POA: Diagnosis not present

## 2021-11-15 DIAGNOSIS — I129 Hypertensive chronic kidney disease with stage 1 through stage 4 chronic kidney disease, or unspecified chronic kidney disease: Secondary | ICD-10-CM | POA: Diagnosis not present

## 2021-11-15 DIAGNOSIS — M170 Bilateral primary osteoarthritis of knee: Secondary | ICD-10-CM | POA: Diagnosis not present

## 2021-11-16 DIAGNOSIS — M5134 Other intervertebral disc degeneration, thoracic region: Secondary | ICD-10-CM | POA: Diagnosis not present

## 2021-11-16 DIAGNOSIS — E079 Disorder of thyroid, unspecified: Secondary | ICD-10-CM | POA: Diagnosis not present

## 2021-11-16 DIAGNOSIS — I10 Essential (primary) hypertension: Secondary | ICD-10-CM | POA: Diagnosis not present

## 2021-11-16 DIAGNOSIS — I129 Hypertensive chronic kidney disease with stage 1 through stage 4 chronic kidney disease, or unspecified chronic kidney disease: Secondary | ICD-10-CM | POA: Diagnosis not present

## 2021-11-16 DIAGNOSIS — Z8673 Personal history of transient ischemic attack (TIA), and cerebral infarction without residual deficits: Secondary | ICD-10-CM | POA: Diagnosis not present

## 2021-11-16 DIAGNOSIS — N1831 Chronic kidney disease, stage 3a: Secondary | ICD-10-CM | POA: Diagnosis not present

## 2021-11-16 DIAGNOSIS — I739 Peripheral vascular disease, unspecified: Secondary | ICD-10-CM | POA: Diagnosis not present

## 2021-11-16 DIAGNOSIS — E119 Type 2 diabetes mellitus without complications: Secondary | ICD-10-CM | POA: Diagnosis not present

## 2021-11-16 DIAGNOSIS — M170 Bilateral primary osteoarthritis of knee: Secondary | ICD-10-CM | POA: Diagnosis not present

## 2021-11-16 DIAGNOSIS — M6281 Muscle weakness (generalized): Secondary | ICD-10-CM | POA: Diagnosis not present

## 2021-11-16 DIAGNOSIS — E1159 Type 2 diabetes mellitus with other circulatory complications: Secondary | ICD-10-CM | POA: Diagnosis not present

## 2021-11-16 DIAGNOSIS — G3184 Mild cognitive impairment, so stated: Secondary | ICD-10-CM | POA: Diagnosis not present

## 2021-11-17 DIAGNOSIS — L853 Xerosis cutis: Secondary | ICD-10-CM | POA: Diagnosis not present

## 2021-11-17 DIAGNOSIS — Z79899 Other long term (current) drug therapy: Secondary | ICD-10-CM | POA: Diagnosis not present

## 2021-11-17 DIAGNOSIS — R682 Dry mouth, unspecified: Secondary | ICD-10-CM | POA: Diagnosis not present

## 2021-11-18 DIAGNOSIS — M6281 Muscle weakness (generalized): Secondary | ICD-10-CM | POA: Diagnosis not present

## 2021-11-18 DIAGNOSIS — Z8673 Personal history of transient ischemic attack (TIA), and cerebral infarction without residual deficits: Secondary | ICD-10-CM | POA: Diagnosis not present

## 2021-11-19 DIAGNOSIS — M6281 Muscle weakness (generalized): Secondary | ICD-10-CM | POA: Diagnosis not present

## 2021-11-19 DIAGNOSIS — Z8673 Personal history of transient ischemic attack (TIA), and cerebral infarction without residual deficits: Secondary | ICD-10-CM | POA: Diagnosis not present

## 2021-11-21 DIAGNOSIS — M6281 Muscle weakness (generalized): Secondary | ICD-10-CM | POA: Diagnosis not present

## 2021-11-21 DIAGNOSIS — I129 Hypertensive chronic kidney disease with stage 1 through stage 4 chronic kidney disease, or unspecified chronic kidney disease: Secondary | ICD-10-CM | POA: Diagnosis not present

## 2021-11-21 DIAGNOSIS — L22 Diaper dermatitis: Secondary | ICD-10-CM | POA: Diagnosis not present

## 2021-11-21 DIAGNOSIS — M170 Bilateral primary osteoarthritis of knee: Secondary | ICD-10-CM | POA: Diagnosis not present

## 2021-11-21 DIAGNOSIS — I10 Essential (primary) hypertension: Secondary | ICD-10-CM | POA: Diagnosis not present

## 2021-11-21 DIAGNOSIS — Z8673 Personal history of transient ischemic attack (TIA), and cerebral infarction without residual deficits: Secondary | ICD-10-CM | POA: Diagnosis not present

## 2021-11-21 DIAGNOSIS — N1831 Chronic kidney disease, stage 3a: Secondary | ICD-10-CM | POA: Diagnosis not present

## 2021-11-21 DIAGNOSIS — M5134 Other intervertebral disc degeneration, thoracic region: Secondary | ICD-10-CM | POA: Diagnosis not present

## 2021-11-21 DIAGNOSIS — D508 Other iron deficiency anemias: Secondary | ICD-10-CM | POA: Diagnosis not present

## 2021-11-21 DIAGNOSIS — L509 Urticaria, unspecified: Secondary | ICD-10-CM | POA: Diagnosis not present

## 2021-11-21 DIAGNOSIS — E079 Disorder of thyroid, unspecified: Secondary | ICD-10-CM | POA: Diagnosis not present

## 2021-11-21 DIAGNOSIS — I739 Peripheral vascular disease, unspecified: Secondary | ICD-10-CM | POA: Diagnosis not present

## 2021-11-21 DIAGNOSIS — M160 Bilateral primary osteoarthritis of hip: Secondary | ICD-10-CM | POA: Diagnosis not present

## 2021-11-21 DIAGNOSIS — E1159 Type 2 diabetes mellitus with other circulatory complications: Secondary | ICD-10-CM | POA: Diagnosis not present

## 2021-11-21 DIAGNOSIS — G3184 Mild cognitive impairment, so stated: Secondary | ICD-10-CM | POA: Diagnosis not present

## 2021-11-22 DIAGNOSIS — Z8673 Personal history of transient ischemic attack (TIA), and cerebral infarction without residual deficits: Secondary | ICD-10-CM | POA: Diagnosis not present

## 2021-11-22 DIAGNOSIS — M6281 Muscle weakness (generalized): Secondary | ICD-10-CM | POA: Diagnosis not present

## 2021-11-23 DIAGNOSIS — M6281 Muscle weakness (generalized): Secondary | ICD-10-CM | POA: Diagnosis not present

## 2021-11-23 DIAGNOSIS — Z8673 Personal history of transient ischemic attack (TIA), and cerebral infarction without residual deficits: Secondary | ICD-10-CM | POA: Diagnosis not present

## 2021-11-24 ENCOUNTER — Inpatient Hospital Stay (HOSPITAL_COMMUNITY)
Admission: EM | Admit: 2021-11-24 | Discharge: 2021-11-27 | DRG: 871 | Disposition: A | Discharge status: Skilled Nursing Facility | Payer: Medicare Other | Source: Skilled Nursing Facility | Attending: Internal Medicine | Admitting: Internal Medicine

## 2021-11-24 ENCOUNTER — Emergency Department (HOSPITAL_COMMUNITY): Payer: Medicare Other

## 2021-11-24 ENCOUNTER — Encounter (HOSPITAL_COMMUNITY): Payer: Self-pay

## 2021-11-24 ENCOUNTER — Other Ambulatory Visit: Payer: Self-pay

## 2021-11-24 DIAGNOSIS — N390 Urinary tract infection, site not specified: Principal | ICD-10-CM

## 2021-11-24 DIAGNOSIS — I499 Cardiac arrhythmia, unspecified: Secondary | ICD-10-CM | POA: Diagnosis not present

## 2021-11-24 DIAGNOSIS — M6281 Muscle weakness (generalized): Secondary | ICD-10-CM | POA: Diagnosis not present

## 2021-11-24 DIAGNOSIS — Z7401 Bed confinement status: Secondary | ICD-10-CM | POA: Diagnosis not present

## 2021-11-24 DIAGNOSIS — D631 Anemia in chronic kidney disease: Secondary | ICD-10-CM | POA: Diagnosis present

## 2021-11-24 DIAGNOSIS — I129 Hypertensive chronic kidney disease with stage 1 through stage 4 chronic kidney disease, or unspecified chronic kidney disease: Secondary | ICD-10-CM | POA: Diagnosis present

## 2021-11-24 DIAGNOSIS — Z87891 Personal history of nicotine dependence: Secondary | ICD-10-CM | POA: Diagnosis not present

## 2021-11-24 DIAGNOSIS — Z79899 Other long term (current) drug therapy: Secondary | ICD-10-CM | POA: Diagnosis not present

## 2021-11-24 DIAGNOSIS — A419 Sepsis, unspecified organism: Secondary | ICD-10-CM | POA: Diagnosis present

## 2021-11-24 DIAGNOSIS — F0390 Unspecified dementia without behavioral disturbance: Secondary | ICD-10-CM | POA: Diagnosis present

## 2021-11-24 DIAGNOSIS — R000 Tachycardia, unspecified: Secondary | ICD-10-CM | POA: Diagnosis not present

## 2021-11-24 DIAGNOSIS — Z20822 Contact with and (suspected) exposure to covid-19: Secondary | ICD-10-CM | POA: Diagnosis present

## 2021-11-24 DIAGNOSIS — R6510 Systemic inflammatory response syndrome (SIRS) of non-infectious origin without acute organ dysfunction: Secondary | ICD-10-CM | POA: Diagnosis present

## 2021-11-24 DIAGNOSIS — R6520 Severe sepsis without septic shock: Secondary | ICD-10-CM | POA: Diagnosis present

## 2021-11-24 DIAGNOSIS — E43 Unspecified severe protein-calorie malnutrition: Secondary | ICD-10-CM | POA: Diagnosis not present

## 2021-11-24 DIAGNOSIS — A401 Sepsis due to streptococcus, group B: Secondary | ICD-10-CM | POA: Diagnosis not present

## 2021-11-24 DIAGNOSIS — Z1159 Encounter for screening for other viral diseases: Secondary | ICD-10-CM | POA: Diagnosis not present

## 2021-11-24 DIAGNOSIS — Z743 Need for continuous supervision: Secondary | ICD-10-CM | POA: Diagnosis not present

## 2021-11-24 DIAGNOSIS — I739 Peripheral vascular disease, unspecified: Secondary | ICD-10-CM | POA: Diagnosis not present

## 2021-11-24 DIAGNOSIS — S43021S Posterior subluxation of right humerus, sequela: Secondary | ICD-10-CM | POA: Diagnosis not present

## 2021-11-24 DIAGNOSIS — L509 Urticaria, unspecified: Secondary | ICD-10-CM | POA: Diagnosis not present

## 2021-11-24 DIAGNOSIS — Z7984 Long term (current) use of oral hypoglycemic drugs: Secondary | ICD-10-CM | POA: Diagnosis not present

## 2021-11-24 DIAGNOSIS — E1122 Type 2 diabetes mellitus with diabetic chronic kidney disease: Secondary | ICD-10-CM | POA: Diagnosis not present

## 2021-11-24 DIAGNOSIS — Z7982 Long term (current) use of aspirin: Secondary | ICD-10-CM | POA: Diagnosis not present

## 2021-11-24 DIAGNOSIS — Z66 Do not resuscitate: Secondary | ICD-10-CM | POA: Diagnosis not present

## 2021-11-24 DIAGNOSIS — Z8673 Personal history of transient ischemic attack (TIA), and cerebral infarction without residual deficits: Secondary | ICD-10-CM | POA: Diagnosis not present

## 2021-11-24 DIAGNOSIS — G9341 Metabolic encephalopathy: Secondary | ICD-10-CM | POA: Diagnosis present

## 2021-11-24 DIAGNOSIS — N179 Acute kidney failure, unspecified: Secondary | ICD-10-CM | POA: Diagnosis not present

## 2021-11-24 DIAGNOSIS — D508 Other iron deficiency anemias: Secondary | ICD-10-CM | POA: Diagnosis not present

## 2021-11-24 DIAGNOSIS — I10 Essential (primary) hypertension: Secondary | ICD-10-CM | POA: Diagnosis not present

## 2021-11-24 DIAGNOSIS — I700 Atherosclerosis of aorta: Secondary | ICD-10-CM | POA: Diagnosis not present

## 2021-11-24 DIAGNOSIS — M160 Bilateral primary osteoarthritis of hip: Secondary | ICD-10-CM | POA: Diagnosis not present

## 2021-11-24 DIAGNOSIS — N1831 Chronic kidney disease, stage 3a: Secondary | ICD-10-CM | POA: Diagnosis present

## 2021-11-24 DIAGNOSIS — E079 Disorder of thyroid, unspecified: Secondary | ICD-10-CM | POA: Diagnosis not present

## 2021-11-24 DIAGNOSIS — R682 Dry mouth, unspecified: Secondary | ICD-10-CM | POA: Diagnosis not present

## 2021-11-24 DIAGNOSIS — E1159 Type 2 diabetes mellitus with other circulatory complications: Secondary | ICD-10-CM | POA: Diagnosis not present

## 2021-11-24 DIAGNOSIS — Z888 Allergy status to other drugs, medicaments and biological substances status: Secondary | ICD-10-CM

## 2021-11-24 DIAGNOSIS — M19011 Primary osteoarthritis, right shoulder: Secondary | ICD-10-CM | POA: Diagnosis not present

## 2021-11-24 DIAGNOSIS — M170 Bilateral primary osteoarthritis of knee: Secondary | ICD-10-CM | POA: Diagnosis not present

## 2021-11-24 DIAGNOSIS — L03119 Cellulitis of unspecified part of limb: Secondary | ICD-10-CM

## 2021-11-24 DIAGNOSIS — R410 Disorientation, unspecified: Secondary | ICD-10-CM | POA: Diagnosis not present

## 2021-11-24 DIAGNOSIS — R0689 Other abnormalities of breathing: Secondary | ICD-10-CM | POA: Diagnosis not present

## 2021-11-24 DIAGNOSIS — K449 Diaphragmatic hernia without obstruction or gangrene: Secondary | ICD-10-CM | POA: Diagnosis not present

## 2021-11-24 DIAGNOSIS — R6889 Other general symptoms and signs: Secondary | ICD-10-CM | POA: Diagnosis not present

## 2021-11-24 DIAGNOSIS — M5134 Other intervertebral disc degeneration, thoracic region: Secondary | ICD-10-CM | POA: Diagnosis not present

## 2021-11-24 LAB — CBC WITH DIFFERENTIAL/PLATELET
Abs Immature Granulocytes: 0.02 10*3/uL (ref 0.00–0.07)
Basophils Absolute: 0 10*3/uL (ref 0.0–0.1)
Basophils Relative: 0 %
Eosinophils Absolute: 0 10*3/uL (ref 0.0–0.5)
Eosinophils Relative: 0 %
HCT: 34.7 % — ABNORMAL LOW (ref 36.0–46.0)
Hemoglobin: 10.7 g/dL — ABNORMAL LOW (ref 12.0–15.0)
Immature Granulocytes: 0 %
Lymphocytes Relative: 3 %
Lymphs Abs: 0.2 10*3/uL — ABNORMAL LOW (ref 0.7–4.0)
MCH: 30.7 pg (ref 26.0–34.0)
MCHC: 30.8 g/dL (ref 30.0–36.0)
MCV: 99.7 fL (ref 80.0–100.0)
Monocytes Absolute: 0.2 10*3/uL (ref 0.1–1.0)
Monocytes Relative: 3 %
Neutro Abs: 6.3 10*3/uL (ref 1.7–7.7)
Neutrophils Relative %: 94 %
Platelets: 178 10*3/uL (ref 150–400)
RBC: 3.48 MIL/uL — ABNORMAL LOW (ref 3.87–5.11)
RDW: 14.6 % (ref 11.5–15.5)
WBC: 6.7 10*3/uL (ref 4.0–10.5)
nRBC: 0 % (ref 0.0–0.2)

## 2021-11-24 LAB — URINALYSIS, ROUTINE W REFLEX MICROSCOPIC
Bilirubin Urine: NEGATIVE
Glucose, UA: NEGATIVE mg/dL
Ketones, ur: NEGATIVE mg/dL
Nitrite: NEGATIVE
Protein, ur: 100 mg/dL — AB
Specific Gravity, Urine: 1.019 (ref 1.005–1.030)
pH: 5 (ref 5.0–8.0)

## 2021-11-24 LAB — PROTIME-INR
INR: 1.2 (ref 0.8–1.2)
Prothrombin Time: 15.4 seconds — ABNORMAL HIGH (ref 11.4–15.2)

## 2021-11-24 LAB — HEMOGLOBIN A1C
Hgb A1c MFr Bld: 6.4 % — ABNORMAL HIGH (ref 4.8–5.6)
Mean Plasma Glucose: 136.98 mg/dL

## 2021-11-24 LAB — COMPREHENSIVE METABOLIC PANEL
ALT: 16 U/L (ref 0–44)
AST: 16 U/L (ref 15–41)
Albumin: 2.7 g/dL — ABNORMAL LOW (ref 3.5–5.0)
Alkaline Phosphatase: 60 U/L (ref 38–126)
Anion gap: 8 (ref 5–15)
BUN: 56 mg/dL — ABNORMAL HIGH (ref 8–23)
CO2: 24 mmol/L (ref 22–32)
Calcium: 8.5 mg/dL — ABNORMAL LOW (ref 8.9–10.3)
Chloride: 101 mmol/L (ref 98–111)
Creatinine, Ser: 1.7 mg/dL — ABNORMAL HIGH (ref 0.44–1.00)
GFR, Estimated: 29 mL/min — ABNORMAL LOW (ref >60)
Glucose, Bld: 220 mg/dL — ABNORMAL HIGH (ref 70–99)
Potassium: 4 mmol/L (ref 3.5–5.1)
Sodium: 133 mmol/L — ABNORMAL LOW (ref 135–145)
Total Bilirubin: 1.1 mg/dL (ref 0.3–1.2)
Total Protein: 8 g/dL (ref 6.5–8.1)

## 2021-11-24 LAB — CBG MONITORING, ED
Glucose-Capillary: 183 mg/dL — ABNORMAL HIGH (ref 70–99)
Glucose-Capillary: 149 mg/dL — ABNORMAL HIGH (ref 70–99)

## 2021-11-24 LAB — RESP PANEL BY RT-PCR (FLU A&B, COVID) ARPGX2
Influenza A by PCR: NEGATIVE
Influenza B by PCR: NEGATIVE
SARS Coronavirus 2 by RT PCR: NEGATIVE

## 2021-11-24 LAB — LACTIC ACID, PLASMA
Lactic Acid, Venous: 1.8 mmol/L (ref 0.5–1.9)
Lactic Acid, Venous: 1.6 mmol/L (ref 0.5–1.9)

## 2021-11-24 IMAGING — DX DG CHEST 1V PORT
1 series · 1 of 1 positions shown · non-contrast
Comparison: [DATE]; chest CT-[DATE]

CLINICAL DATA: Questionable sepsis.

EXAM:
PORTABLE CHEST 1 VIEW

[chest ap]
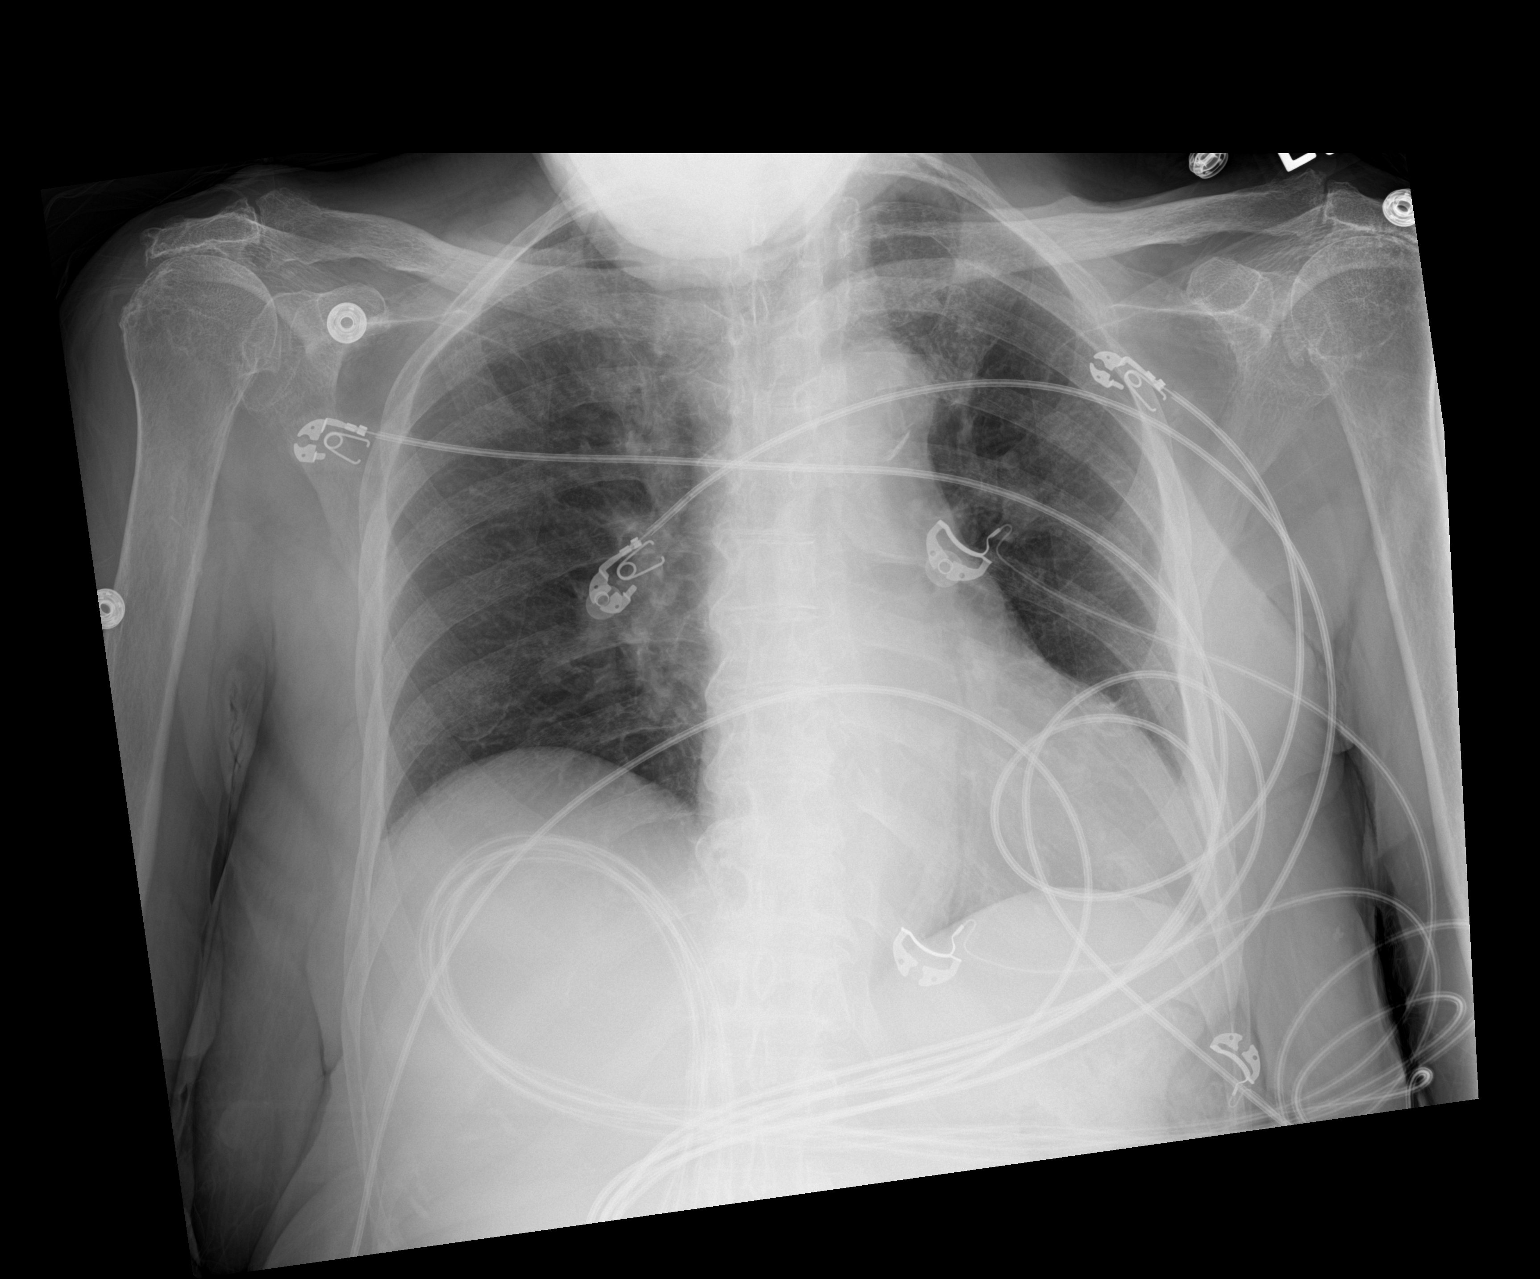

[1 of 1 positions shown; findings below may reference images not displayed]

FINDINGS: Grossly unchanged cardiac silhouette and mediastinal contours with
atherosclerotic plaque within the thoracic aorta. Overall improved
aeration of the lungs. There is persistent mild
elevation/eventration the right hemidiaphragm. No discrete focal
airspace opacities. No pleural effusion or pneumothorax no evidence
of edema. No acute osseous abnormalities. The bilateral humeral
heads are high-riding in relation to the undersurface of the
acromion. Mild-to-moderate degenerative change of the bilateral
acromioclavicular and glenohumeral joints is suspected though
incompletely evaluated.
IMPRESSION: No acute cardiopulmonary disease. Specifically, no evidence of
pneumonia. Further evaluation with a PA and lateral chest radiograph
may be obtained as clinically indicated.

## 2021-11-24 MED ORDER — VANCOMYCIN HCL IN DEXTROSE 1-5 GM/200ML-% IV SOLN: 1000.0000 mg | INTRAVENOUS | Status: DC

## 2021-11-24 MED ORDER — VANCOMYCIN HCL 1250 MG/250ML IV SOLN
1250.0000 mg | Freq: Once | INTRAVENOUS | Status: AC
  Administered 2021-11-24: 1250 mg via INTRAVENOUS
  Filled 2021-11-24: qty 250

## 2021-11-24 MED ORDER — INSULIN ASPART 100 UNIT/ML IJ SOLN
0.0000 [IU] | Freq: Three times a day (TID) | INTRAMUSCULAR | Status: DC
  Administered 2021-11-24 – 2021-11-25 (×3): 2 [IU] via SUBCUTANEOUS
  Administered 2021-11-25: 1 [IU] via SUBCUTANEOUS
  Administered 2021-11-26: 2 [IU] via SUBCUTANEOUS
  Administered 2021-11-26: 3 [IU] via SUBCUTANEOUS
  Administered 2021-11-26: 5 [IU] via SUBCUTANEOUS
  Administered 2021-11-27: 2 [IU] via SUBCUTANEOUS
  Filled 2021-11-24: qty 0.09

## 2021-11-24 MED ORDER — METRONIDAZOLE 500 MG/100ML IV SOLN
500.0000 mg | Freq: Once | INTRAVENOUS | Status: AC
  Administered 2021-11-24: 500 mg via INTRAVENOUS
  Filled 2021-11-24: qty 100

## 2021-11-24 MED ORDER — SODIUM CHLORIDE 0.9 % IV SOLN
2.0000 g | Freq: Once | INTRAVENOUS | Status: AC
  Administered 2021-11-24: 2 g via INTRAVENOUS
  Filled 2021-11-24: qty 12.5

## 2021-11-24 MED ORDER — VANCOMYCIN HCL IN DEXTROSE 1-5 GM/200ML-% IV SOLN: 1000.0000 mg | Freq: Once | INTRAVENOUS | Status: DC

## 2021-11-24 MED ORDER — INSULIN ASPART 100 UNIT/ML IJ SOLN
0.0000 [IU] | Freq: Every day | INTRAMUSCULAR | Status: DC
  Administered 2021-11-26: 2 [IU] via SUBCUTANEOUS
  Filled 2021-11-24: qty 0.05

## 2021-11-24 MED ORDER — SODIUM CHLORIDE 0.9 % IV SOLN
2.0000 g | INTRAVENOUS | Status: DC
  Administered 2021-11-25: 2 g via INTRAVENOUS
  Filled 2021-11-24: qty 12.5

## 2021-11-24 MED ORDER — LACTATED RINGERS IV SOLN: INTRAVENOUS | Status: DC

## 2021-11-24 MED ORDER — ACETAMINOPHEN 325 MG PO TABS
650.0000 mg | ORAL_TABLET | Freq: Once | ORAL | Status: AC
  Administered 2021-11-24: 650 mg via ORAL
  Filled 2021-11-24: qty 2

## 2021-11-24 MED ORDER — LACTATED RINGERS IV BOLUS (SEPSIS)
1000.0000 mL | Freq: Once | INTRAVENOUS | Status: AC
  Administered 2021-11-24: 1000 mL via INTRAVENOUS

## 2021-11-24 NOTE — Progress Notes (Signed)
A consult was received from an ED physician for cefepime and vancomycin per pharmacy dosing.  The patient's profile has been reviewed for ht/wt/allergies/indication/available labs.   ? ?Agree with cefepime 2 g IV x 1 entered by provider.  A one time order has been placed for vancomycin 1250 mg IV.  Further antibiotics/pharmacy consults should be ordered by admitting physician if indicated.       ?                ?Royetta Asal, PharmD, BCPS ?Clinical Pharmacist ?[place] ?Please utilize Amion for appropriate phone number to reach the unit pharmacist ([place]) ?11/24/2021 10:24 AM ? ? ?

## 2021-11-24 NOTE — H&P (Signed)
?History and Physical  ? ? ?Patient: Penny [name] HFW:[id number] DOB: 1934/10/30 ?DOA: 11/24/2021 ?DOS: the patient was seen and examined on 11/24/2021 ?PCP: Garwin Brothers, MD  ?Patient coming from: SNF ? ?Chief Complaint:  ?Chief Complaint  ?Patient presents with  ? Fever  ? Altered Mental Status  ? ?HPI: Penny [name] is a 87 y.o. female with medical history significant of CKD 3a, DM2, dementia. Presenting with fever and AMS. History is from chart review as patient is confused. Her SNF sent her over d/t reported fever and altered mental status. Fever first noted last night. She apparently has had some difficulty recently with her legs, but otherwise no reports respiratory symptoms, N/V/D, or dysuria.  ? ?Review of Systems: unable to review all systems due to confusion. ?Past Medical History:  ?Diagnosis Date  ? CKD (chronic kidney disease) stage 3, GFR 30-59 ml/min ([place]) 04/17/2021  ? ?Past Surgical History:  ?Procedure Laterality Date  ? INCISION AND DRAINAGE HIP Right 05/11/2021  ? Procedure: IRRIGATION AND DEBRIDEMENT HIP PLACEMENT OF WOUND [place];  Surgeon: Tania Ade, MD;  Location: [facility] ORS;  Service: Orthopedics;  Laterality: Right;  ? ?Social History:  reports that she has never smoked. She quit smokeless tobacco use about 65 years ago.  Her smokeless tobacco use included snuff and chew. She reports that she does not drink alcohol and does not use drugs. ? ?Allergies  ?Allergen Reactions  ? Glipizide Rash  ? ? ?History reviewed. No pertinent family history. ? ?Prior to Admission medications   ?Medication Sig Start Date End Date Taking? Authorizing Provider  ?acetaminophen (TYLENOL) 325 MG tablet Take 650 mg by mouth every 6 (six) hours as needed for mild pain.   Yes [provider]  ?acetaminophen (TYLENOL) 650 MG CR tablet Take 650 mg by mouth every 12 (twelve) hours.   Yes [provider]  ?acetaminophen (TYLENOL) 650 MG suppository Place 650 mg rectally every 6 (six) hours as needed (general  discomfort).   Yes [provider]  ?aspirin 81 MG tablet Take 81 mg by mouth daily.   Yes [provider]  ?carvedilol (COREG) 12.5 MG tablet Take 12.5 mg by mouth 2 (two) times daily. 11/19/21  Yes [provider]  ?feeding supplement, GLUCERNA SHAKE, (GLUCERNA SHAKE) LIQD Take 237 mLs by mouth 2 (two) times daily between meals.   Yes [provider]  ?hydrALAZINE (APRESOLINE) 25 MG tablet Take 1 tablet (25 mg total) by mouth every 8 (eight) hours. 04/21/21  Yes Hosie Poisson, MD  ?hydrOXYzine (ATARAX) 25 MG tablet Take 25 mg by mouth every 6 (six) hours as needed for itching. 11/09/21  Yes [provider]  ?magnesium hydroxide (MILK OF MAGNESIA) 400 MG/5ML suspension Take 30 mLs by mouth every 12 (twelve) hours as needed for mild constipation.   Yes [provider]  ?melatonin 3 MG TABS tablet Take 3 mg by mouth at bedtime.   Yes [provider]  ?metFORMIN (GLUCOPHAGE) 500 MG tablet Take 500 mg by mouth 2 (two) times daily with a meal. 10/01/15  Yes [provider]  ?Multiple Vitamins-Minerals (MULTIVITAMIN ADULTS) TABS Take 1 tablet by mouth daily.   Yes [provider]  ?polyethylene glycol (MIRALAX / GLYCOLAX) 17 g packet Take 17 g by mouth daily. 05/17/21  Yes Mariel Aloe, MD  ?sennosides-docusate sodium (SENOKOT-S) 8.6-50 MG tablet Take 2 tablets by mouth at bedtime.   Yes [provider]  ? ? ?Physical Exam: ?Vitals:  ? 11/24/21 1330 11/24/21  1343 11/24/21 1400 11/24/21 1430  ?BP: (!) 118/49  (!) 117/50 (!) 95/56  ?Pulse: 68  63 61  ?Resp: '16  16 16  '$ ?Temp:  98.6 ?F (37 ?C)    ?TempSrc:  Oral    ?SpO2: 97%  94% 99%  ?Weight:      ?Height:      ? ?General: 87 y.o. female resting in bed in NAD ?Eyes: PERRL, normal sclera ?ENMT: Nares patent w/o discharge, orophaynx clear, dentition normal, ears w/o discharge/lesions/ulcers ?Neck: Supple, trachea midline ?Cardiovascular: RRR, +S1, S2, no m/g/r, equal pulses  throughout ?Respiratory: CTABL, no w/r/r, normal WOB ?GI: BS+, NDNT, no masses noted, no organomegaly noted ?MSK: No c/c, mild edema of BLE, also with b/l medial thigh erythema that seems worse on left ?Neuro: A&O x 2, no focal deficits ?Psyc: Pleasantly confused, calm/cooperative ? ?Data Reviewed: ? ?Na+  133 ?Glucose 220 ?BUN 56 ?SCR  1.70 ?Hgb  10.7 ? ?CXR:  ?No acute cardiopulmonary disease. Specifically, no evidence of pneumonia. Further evaluation with a PA and lateral chest radiograph may be obtained as clinically indicated. ? ?Assessment and Plan: ?Sepsis ?Bilateral lower extremity cellulitis ?    - admit to inpt, progressive ?    - continue broad spec abx for now and follow bld cx, Ucx ?    - best idea for source right now is cellulitis, but not sure ?    - follow lactic acid ?    - continue fluids       ? ?AKI on CKD 3a ?    - fluids, renal US ?    - watch nephrotoxins ? ?DM2 ?    - A1c, SSI, DM diet, glucose check ? ?Normocytic anemia ?    - no evidence of bleed ?    - check iron studies, follow ? ?Dementia ?    - continue home regimen ?    - delirium precautions ? ?Hx of HTN ?    - pressures are soft, hold home regimen ? ?Advance Care Planning:   Code Status: DNR ? ?Consults: None ? ?Family Communication: None at bedside ? ?Severity of Illness: ?The appropriate patient status for this patient is INPATIENT. Inpatient status is judged to be reasonable and necessary in order to provide the required intensity of service to ensure the patient's safety. The patient's presenting symptoms, physical exam findings, and initial radiographic and laboratory data in the context of their chronic comorbidities is felt to place them at high risk for further clinical deterioration. Furthermore, it is not anticipated that the patient will be medically stable for discharge from the hospital within 2 midnights of admission.  ? ?* I certify that at the point of admission it is my clinical judgment that the patient will  require inpatient hospital care spanning beyond 2 midnights from the point of admission due to high intensity of service, high risk for further deterioration and high frequency of surveillance required.* ? ?Author: ?Jonnie Finner, DO ?11/24/2021 2:45 PM ? ?For on call review [email or web address].  ?

## 2021-11-24 NOTE — ED Notes (Signed)
[name], pt's niece, given an update on pt's status and current plan of care. ?

## 2021-11-24 NOTE — Progress Notes (Signed)
Pharmacy Antibiotic Note ? ?Penny [name] is a 87 y.o. female admitted on 11/24/2021 with fever and altered mental status. Patient with bilateral lower extremity cellulitis. Pharmacy has been consulted for vancomycin and cefepime dosing. ? ?Plan: ?Vancomycin 1250 mg IV x 1 in the ED followed by vancomycin 1 g IV q48h ?Cefepime 2 g IV q24h ? ?Continue to follow renal function, cultures, and clinical progress for necessary antibiotic dosage adjustments or de-escalation as indicated ? ?Height: '5\' 7"'$  (170.2 cm) ?Weight: 63 kg (139 lb) ?IBW/kg (Calculated) : 61.6 ? ?Temp (24hrs), Avg:100.5 ?F (38.1 ?C), Min:98.4 ?F (36.9 ?C), Max:104.5 ?F (40.3 ?C) ? ?Recent Labs  ?Lab 11/24/21 ?1028 11/24/21 ?1400  ?WBC 6.7  --   ?CREATININE 1.70*  --   ?LATICACIDVEN 1.8 1.6  ?  ?Estimated Creatinine Clearance: 23.5 mL/min (A) (by C-G formula based on SCr of 1.7 mg/dL (H)).   ? ?Allergies  ?Allergen Reactions  ? Glipizide Rash  ? ? ?Antimicrobials this admission: ?Cefepime 5/12 >> ?Vancomycin 5/12 >> ? ?Dose adjustments this admission: NA ? ?Microbiology results: ?5/12 BCx: pending ?5/12 UCx: pending  ? ?Thank you for allowing pharmacy to be a part of this patient?s care. ? ?Tawnya Crook, PharmD, BCPS ?Clinical Pharmacist ?11/24/2021 4:46 PM ? ? ?

## 2021-11-24 NOTE — Progress Notes (Signed)
[facility] is following Code Spsis. ?

## 2021-11-24 NOTE — ED Provider Notes (Addendum)
?[facility] DEPT ?Provider Note ? ? ?CSN: [id number] ?Arrival date & time: 11/24/21  7824 ? ?  ? ?History ? ?Chief Complaint  ?Patient presents with  ? Fever  ? Altered Mental Status  ? ? ?Penny [name] is a 87 y.o. female. ? ?HPI ? ?  ?Level 5 caveat secondary to confusion ?87 year old female presents today from skilled nursing facility with reports of altered mental status and fever.  Report obtained via nursing through EMS.  Reports the patient began running a fever last night.  They put that she is normally alert and oriented x4.  Here patient is unable to give me any additional history but does state that her legs hurt. ? ?Home Medications ?Prior to Admission medications   ?Medication Sig Start Date End Date Taking? Authorizing Provider  ?acetaminophen (TYLENOL) 325 MG tablet Take 650 mg by mouth every 6 (six) hours as needed for mild pain.   Yes [provider]  ?acetaminophen (TYLENOL) 650 MG CR tablet Take 650 mg by mouth every 12 (twelve) hours.   Yes [provider]  ?acetaminophen (TYLENOL) 650 MG suppository Place 650 mg rectally every 6 (six) hours as needed (general discomfort).   Yes [provider]  ?aspirin 81 MG tablet Take 81 mg by mouth daily.   Yes [provider]  ?carvedilol (COREG) 12.5 MG tablet Take 12.5 mg by mouth 2 (two) times daily. 11/19/21  Yes [provider]  ?feeding supplement, GLUCERNA SHAKE, (GLUCERNA SHAKE) LIQD Take 237 mLs by mouth 2 (two) times daily between meals.   Yes [provider]  ?hydrALAZINE (APRESOLINE) 25 MG tablet Take 1 tablet (25 mg total) by mouth every 8 (eight) hours. 04/21/21  Yes Hosie Poisson, MD  ?hydrOXYzine (ATARAX) 25 MG tablet Take 25 mg by mouth every 6 (six) hours as needed for itching. 11/09/21  Yes [provider]  ?magnesium hydroxide (MILK OF MAGNESIA) 400 MG/5ML suspension Take 30 mLs by mouth every 12 (twelve) hours as needed for mild constipation.   Yes  [provider]  ?melatonin 3 MG TABS tablet Take 3 mg by mouth at bedtime.   Yes [provider]  ?metFORMIN (GLUCOPHAGE) 500 MG tablet Take 500 mg by mouth 2 (two) times daily with a meal. 10/01/15  Yes [provider]  ?Multiple Vitamins-Minerals (MULTIVITAMIN ADULTS) TABS Take 1 tablet by mouth daily.   Yes [provider]  ?polyethylene glycol (MIRALAX / GLYCOLAX) 17 g packet Take 17 g by mouth daily. 05/17/21  Yes Mariel Aloe, MD  ?sennosides-docusate sodium (SENOKOT-S) 8.6-50 MG tablet Take 2 tablets by mouth at bedtime.   Yes [provider]  ?   ? ?Allergies    ?Glipizide   ? ?Review of Systems   ?Review of Systems  ?Unable to perform ROS: Dementia  ? ?Physical Exam ?Updated Vital Signs ?BP (!) 118/49   Pulse 68   Temp 98.6 ?F (37 ?C) (Oral)   Resp 16   Ht 1.702 m ('5\' 7"'$ )   Wt 63 kg   LMP  (LMP Unknown)   SpO2 97%   BMI 21.77 kg/m?  ?Physical Exam ?Vitals (Patient with temperature of 104.5 and initial blood pressure of 90/46) and nursing note reviewed.  ?Constitutional:   ?   General: She is not in acute distress. ?   Appearance: She is ill-appearing.  ?   Comments: Patient's blood pressures increased to 100/50 on my initial evaluation from first triage blood pressure obtained  ?HENT:  ?  Head: Normocephalic.  ?   Right Ear: External ear normal.  ?   Nose: Nose normal.  ?   Mouth/Throat:  ?   Mouth: Mucous membranes are dry.  ?Eyes:  ?   Pupils: Pupils are equal, round, and reactive to light.  ?Cardiovascular:  ?   Rate and Rhythm: Normal rate and regular rhythm.  ?   Heart sounds: Normal heart sounds.  ?Pulmonary:  ?   Effort: Pulmonary effort is normal.  ?   Breath sounds: Normal breath sounds.  ?Abdominal:  ?   General: Bowel sounds are normal. There is distension.  ?   Comments: Some lower abdomen with mild diffuse tenderness palpation  ?Musculoskeletal:  ?   Cervical back: Normal range of motion.  ?   Comments: Some erythema of bilateral thighs  with diffuse tenderness to palpation medial aspect of bilateral thighs and anterior aspect of upper thighs ?There is a skin lesion that is fairly large on the left medial thigh ?Erythema of the thighs extends bilaterally on the lateral aspects of the thighs with some delineation noted, no crepitus but diffuse tenderness noted ?From knee down no obvious signs of trauma ?No signs of trauma bilateral upper extremities ?There is some skin breakdown in the perirectal area  ?Skin: ?   General: Skin is warm and dry.  ?   Capillary Refill: Capillary refill takes less than 2 seconds.  ?Neurological:  ?   General: No focal deficit present.  ?   Mental Status: She is alert.  ?   Comments: Patient alert oriented to her name and to be in the hospital but is unable to tell me the date  ? ? ?ED Results / Procedures / Treatments   ?Labs ?(all labs ordered are listed, but only abnormal results are displayed) ?Labs Reviewed  ?COMPREHENSIVE METABOLIC PANEL - Abnormal; Notable for the following components:  ?    Result Value  ? Sodium 133 (*)   ? Glucose, Bld 220 (*)   ? BUN 56 (*)   ? Creatinine, Ser 1.70 (*)   ? Calcium 8.5 (*)   ? Albumin 2.7 (*)   ? GFR, Estimated 29 (*)   ? All other components within normal limits  ?CBC WITH DIFFERENTIAL/PLATELET - Abnormal; Notable for the following components:  ? RBC 3.48 (*)   ? Hemoglobin 10.7 (*)   ? HCT 34.7 (*)   ? Lymphs Abs 0.2 (*)   ? All other components within normal limits  ?PROTIME-INR - Abnormal; Notable for the following components:  ? Prothrombin Time 15.4 (*)   ? All other components within normal limits  ?URINALYSIS, ROUTINE W REFLEX MICROSCOPIC - Abnormal; Notable for the following components:  ? Color, Urine AMBER (*)   ? APPearance CLOUDY (*)   ? Hgb urine dipstick SMALL (*)   ? Protein, ur 100 (*)   ? Leukocytes,Ua SMALL (*)   ? Bacteria, UA FEW (*)   ? All other components within normal limits  ?RESP PANEL BY RT-PCR (FLU A&B, COVID) [id number]  ?CULTURE, BLOOD (ROUTINE X 2)   ?CULTURE, BLOOD (ROUTINE X 2)  ?URINE CULTURE  ?LACTIC ACID, PLASMA  ?APTT  ? ? ?EKG ?EKG Interpretation ? ?Date/Time:  Friday Nov 24 2021 09:50:16 EDT ?Ventricular Rate:  75 ?PR Interval:  139 ?QRS Duration: 91 ?QT Interval:  385 ?QTC Calculation: 430 ?R Axis:   5 ?Text Interpretation: Sinus rhythm Probable anteroseptal infarct, old Nonspecific T abnormalities, lateral leads Confirmed by Pattricia Boss [phone]) on  11/24/2021 12:57:07 PM ? ?Radiology ?DG Chest Port 1 View ? ?Result Date: 11/24/2021 ?CLINICAL DATA:  Questionable sepsis. EXAM: PORTABLE CHEST 1 VIEW COMPARISON:  07/06/2021; chest CT-07/10/2021 FINDINGS: Grossly unchanged cardiac silhouette and mediastinal contours with atherosclerotic plaque within the thoracic aorta. Overall improved aeration of the lungs. There is persistent mild elevation/eventration the right hemidiaphragm. No discrete focal airspace opacities. No pleural effusion or pneumothorax no evidence of edema. No acute osseous abnormalities. The bilateral humeral heads are high-riding in relation to the undersurface of the acromion. Mild-to-moderate degenerative change of the bilateral acromioclavicular and glenohumeral joints is suspected though incompletely evaluated. IMPRESSION: No acute cardiopulmonary disease. Specifically, no evidence of pneumonia. Further evaluation with a PA and lateral chest radiograph may be obtained as clinically indicated. Electronically Signed   By: Sandi Mariscal M.D.   On: 11/24/2021 11:08   ? ?Procedures ?Procedures  ? ? ?Medications Ordered in ED ?Medications  ?lactated ringers infusion ( Intravenous New Bag/Given 11/24/21 1103)  ?acetaminophen (TYLENOL) tablet 650 mg (has no administration in time range)  ?lactated ringers bolus 1,000 mL (1,000 mLs Intravenous New Bag/Given 11/24/21 1103)  ?ceFEPIme (MAXIPIME) 2 g in sodium chloride 0.9 % 100 mL IVPB (2 g Intravenous New Bag/Given 11/24/21 1105)  ?metroNIDAZOLE (FLAGYL) IVPB 500 mg (500 mg Intravenous New Bag/Given  11/24/21 1106)  ?vancomycin (VANCOREADY) IVPB 1250 mg/250 mL (1,250 mg Intravenous New Bag/Given 11/24/21 1107)  ? ? ?ED Course/ Medical Decision Making/ A&P ?Clinical Course as of 11/24/21 1345  ?Fri Nov 24, 2021

## 2021-11-24 NOTE — ED Triage Notes (Signed)
Pt coming from [facility] via EMS with c/o Fever and AMS x 1 day. At baseline (per facility) patient is A&Ox4 and able to hold a conversation. Per EMS pt is currently A&O to person and place. No known tylenol administration for fever per EMS. Pt denies any urinary symptoms at this time. ?

## 2021-11-25 ENCOUNTER — Inpatient Hospital Stay (HOSPITAL_COMMUNITY): Payer: Medicare Other

## 2021-11-25 DIAGNOSIS — N1831 Chronic kidney disease, stage 3a: Secondary | ICD-10-CM

## 2021-11-25 DIAGNOSIS — R6510 Systemic inflammatory response syndrome (SIRS) of non-infectious origin without acute organ dysfunction: Secondary | ICD-10-CM | POA: Diagnosis not present

## 2021-11-25 DIAGNOSIS — N179 Acute kidney failure, unspecified: Secondary | ICD-10-CM | POA: Diagnosis not present

## 2021-11-25 LAB — COMPREHENSIVE METABOLIC PANEL
ALT: 14 U/L (ref 0–44)
AST: 19 U/L (ref 15–41)
Albumin: 2.3 g/dL — ABNORMAL LOW (ref 3.5–5.0)
Alkaline Phosphatase: 59 U/L (ref 38–126)
Anion gap: 7 (ref 5–15)
BUN: 58 mg/dL — ABNORMAL HIGH (ref 8–23)
CO2: 24 mmol/L (ref 22–32)
Calcium: 8.3 mg/dL — ABNORMAL LOW (ref 8.9–10.3)
Chloride: 104 mmol/L (ref 98–111)
Creatinine, Ser: 1.71 mg/dL — ABNORMAL HIGH (ref 0.44–1.00)
GFR, Estimated: 29 mL/min — ABNORMAL LOW (ref >60)
Glucose, Bld: 139 mg/dL — ABNORMAL HIGH (ref 70–99)
Potassium: 4.4 mmol/L (ref 3.5–5.1)
Sodium: 135 mmol/L (ref 135–145)
Total Bilirubin: 1 mg/dL (ref 0.3–1.2)
Total Protein: 7.1 g/dL (ref 6.5–8.1)

## 2021-11-25 LAB — DIFFERENTIAL
Abs Immature Granulocytes: 0.05 10*3/uL (ref 0.00–0.07)
Basophils Absolute: 0.1 10*3/uL (ref 0.0–0.1)
Basophils Relative: 0 %
Eosinophils Absolute: 0.1 10*3/uL (ref 0.0–0.5)
Eosinophils Relative: 1 %
Immature Granulocytes: 0 %
Lymphocytes Relative: 2 %
Lymphs Abs: 0.3 10*3/uL — ABNORMAL LOW (ref 0.7–4.0)
Monocytes Absolute: 0.4 10*3/uL (ref 0.1–1.0)
Monocytes Relative: 3 %
Neutro Abs: 12.8 10*3/uL — ABNORMAL HIGH (ref 1.7–7.7)
Neutrophils Relative %: 94 %

## 2021-11-25 LAB — IRON AND TIBC
Iron: 32 ug/dL (ref 28–170)
Saturation Ratios: 19 % (ref 10.4–31.8)
TIBC: 166 ug/dL — ABNORMAL LOW (ref 250–450)
UIBC: 134 ug/dL

## 2021-11-25 LAB — CBC
HCT: 31.6 % — ABNORMAL LOW (ref 36.0–46.0)
Hemoglobin: 9.7 g/dL — ABNORMAL LOW (ref 12.0–15.0)
MCH: 30.8 pg (ref 26.0–34.0)
MCHC: 30.7 g/dL (ref 30.0–36.0)
MCV: 100.3 fL — ABNORMAL HIGH (ref 80.0–100.0)
Platelets: 179 10*3/uL (ref 150–400)
RBC: 3.15 MIL/uL — ABNORMAL LOW (ref 3.87–5.11)
RDW: 14.6 % (ref 11.5–15.5)
WBC: 13.5 10*3/uL — ABNORMAL HIGH (ref 4.0–10.5)
nRBC: 0 % (ref 0.0–0.2)

## 2021-11-25 LAB — URINE CULTURE: Culture: >=100000 — AB

## 2021-11-25 LAB — CORTISOL-AM, BLOOD: Cortisol - AM: 24 ug/dL — ABNORMAL HIGH (ref 6.7–22.6)

## 2021-11-25 LAB — PROCALCITONIN: Procalcitonin: 65.64 ng/mL

## 2021-11-25 LAB — GLUCOSE, CAPILLARY
Glucose-Capillary: 121 mg/dL — ABNORMAL HIGH (ref 70–99)
Glucose-Capillary: 158 mg/dL — ABNORMAL HIGH (ref 70–99)
Glucose-Capillary: 178 mg/dL — ABNORMAL HIGH (ref 70–99)
Glucose-Capillary: 102 mg/dL — ABNORMAL HIGH (ref 70–99)

## 2021-11-25 LAB — PROTIME-INR
INR: 1.3 — ABNORMAL HIGH (ref 0.8–1.2)
Prothrombin Time: 16.2 seconds — ABNORMAL HIGH (ref 11.4–15.2)

## 2021-11-25 IMAGING — US US RENAL
1 series · 15 of 25 positions shown · non-contrast
Comparison: CT [DATE]

CLINICAL DATA: Acute kidney injury.

EXAM:
RENAL / URINARY TRACT ULTRASOUND COMPLETE

[Series 1: us renal mc & wl · 44 acquisitions, 15 frames shown]
[im 1/44]
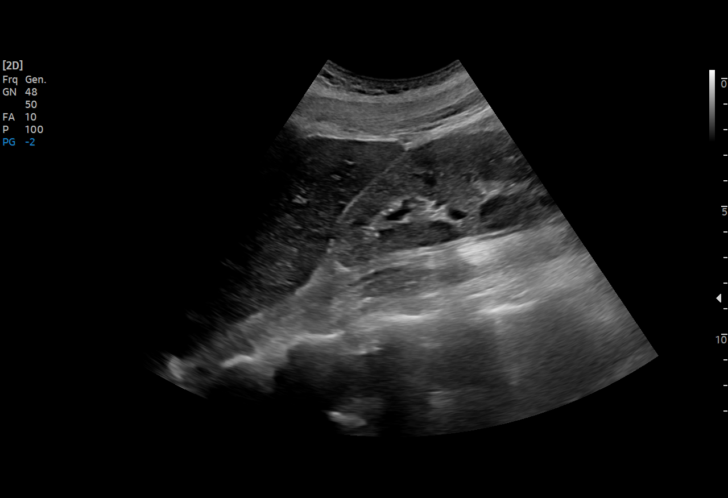
[im 4/44]
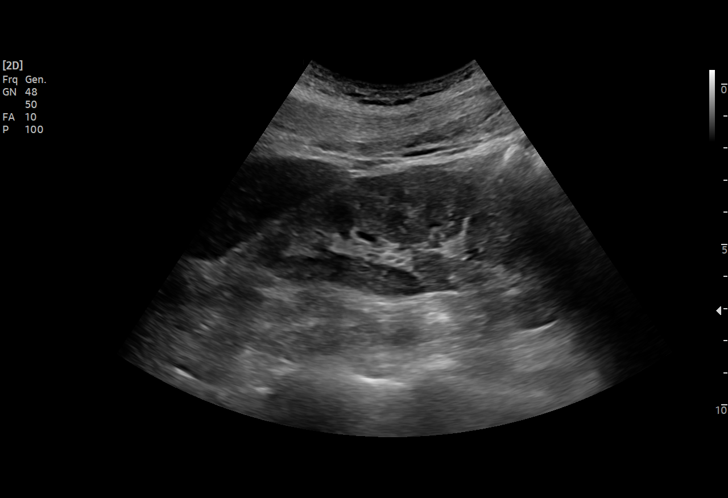
[im 8/44]
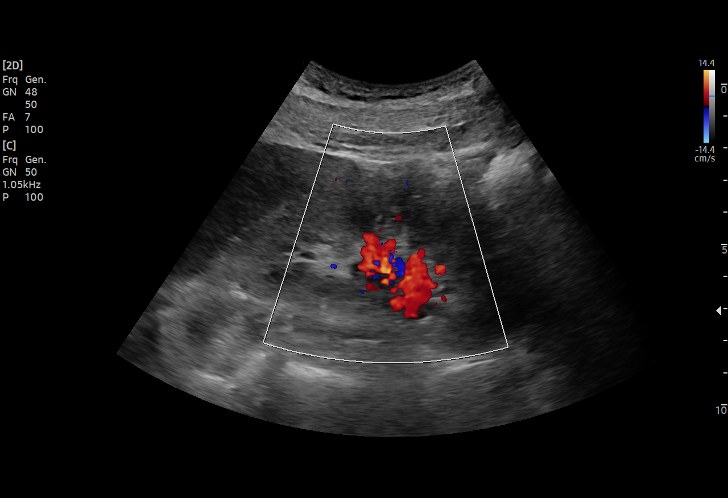
[im 9/44]
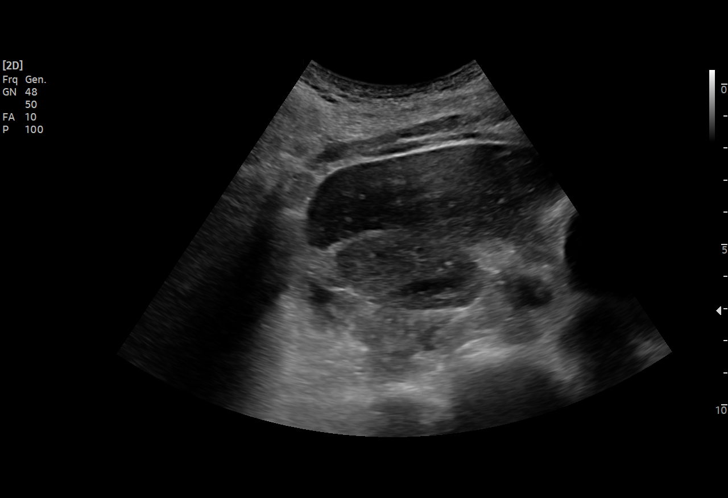
[im 13/44]
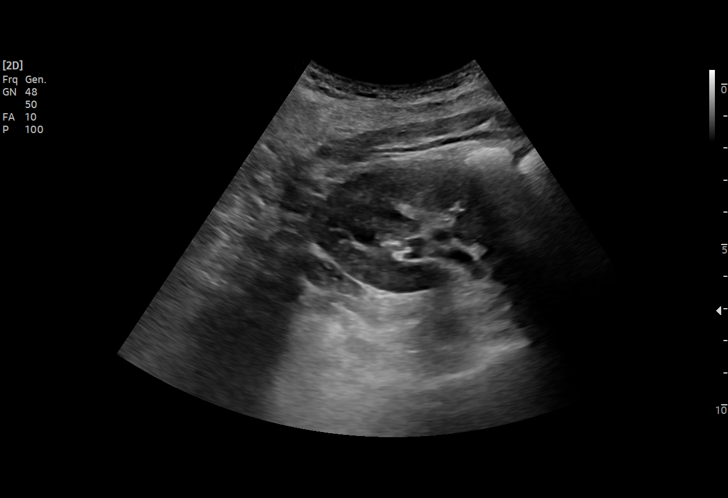
[im 17/44]
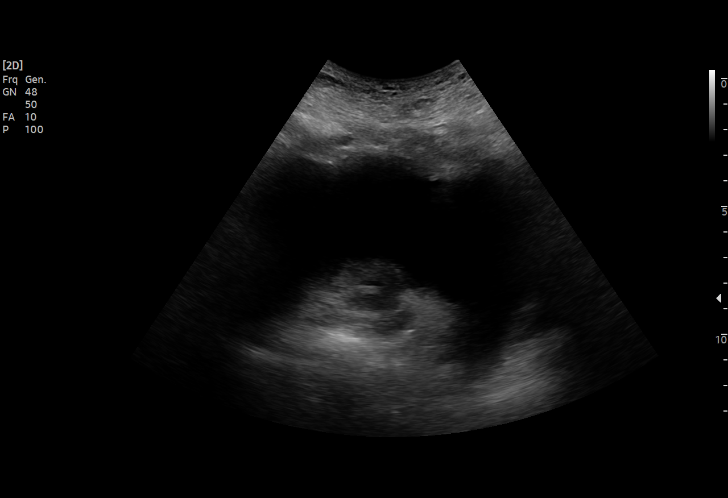
[im 18/44]
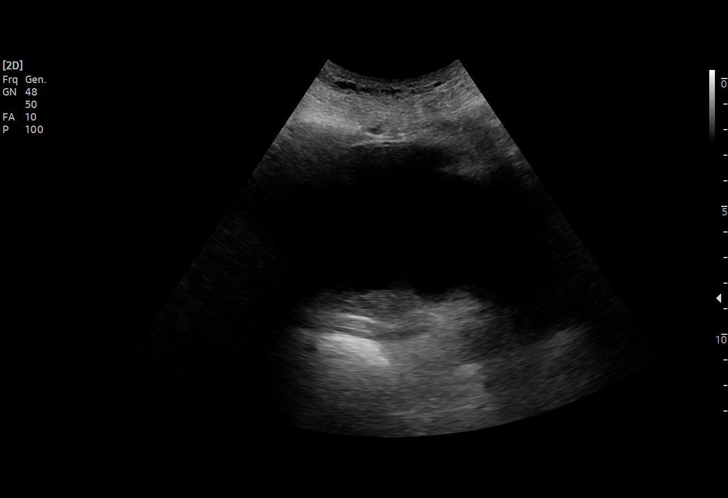
[im 22/44]
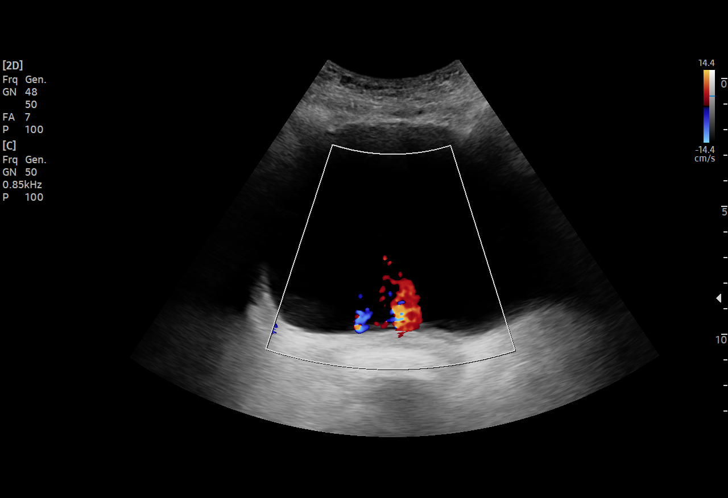
[im 26/44]
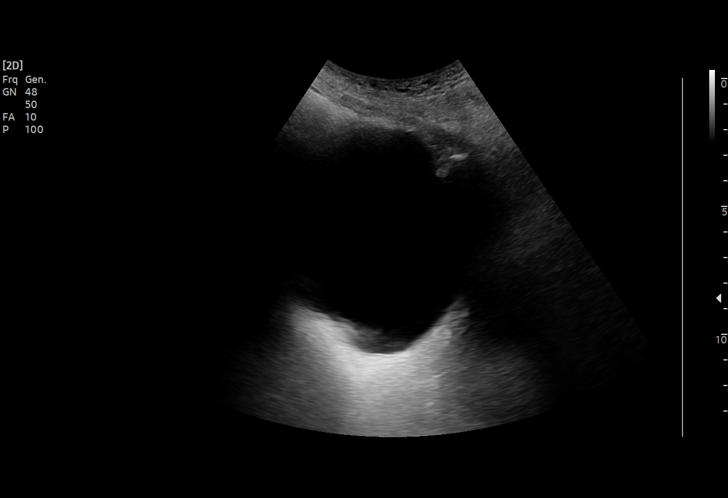
[im 27/44]
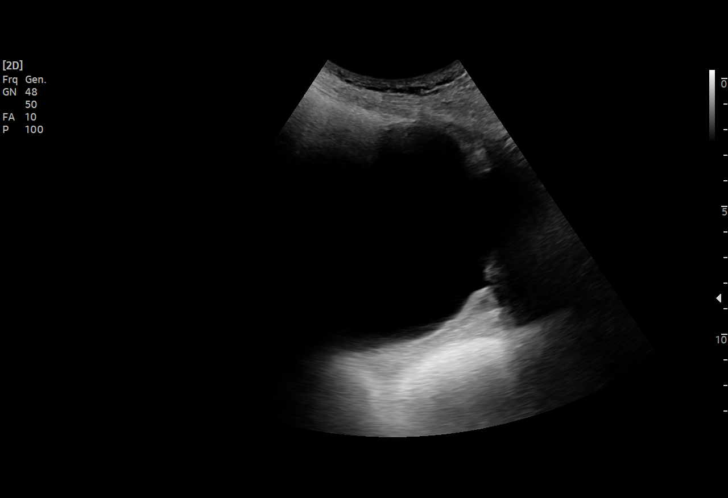
[im 31/44]
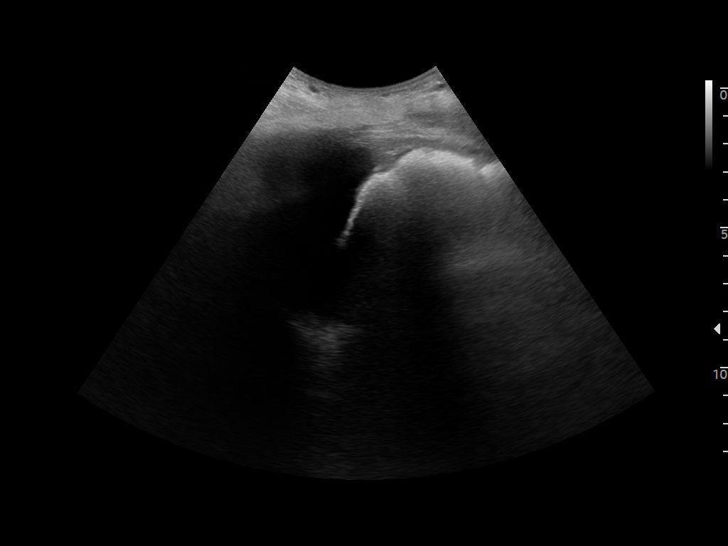
[im 35/44]
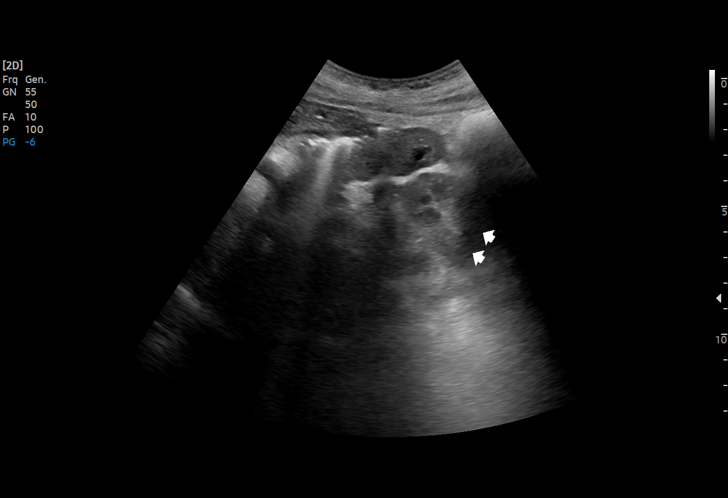
[im 36/44]
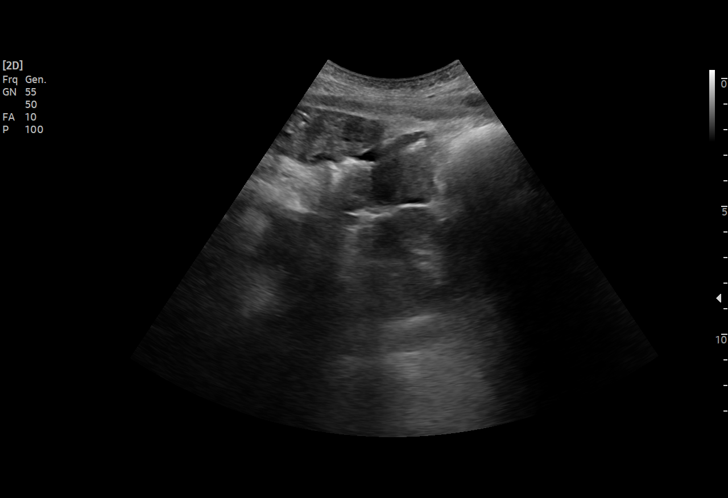
[im 40/44]
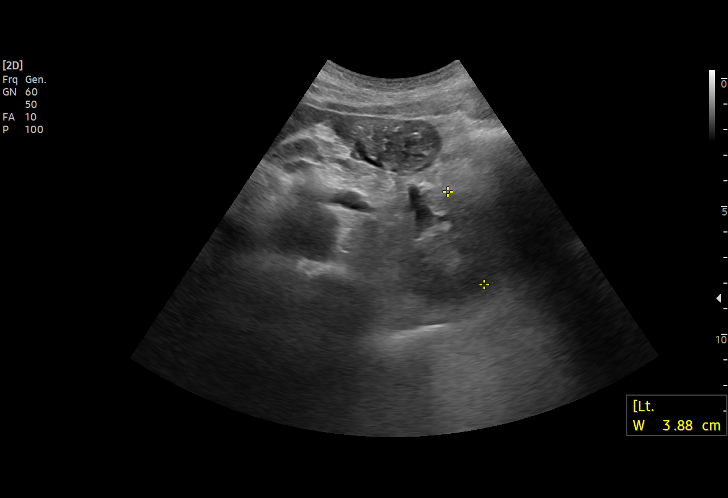
[im 44/44]
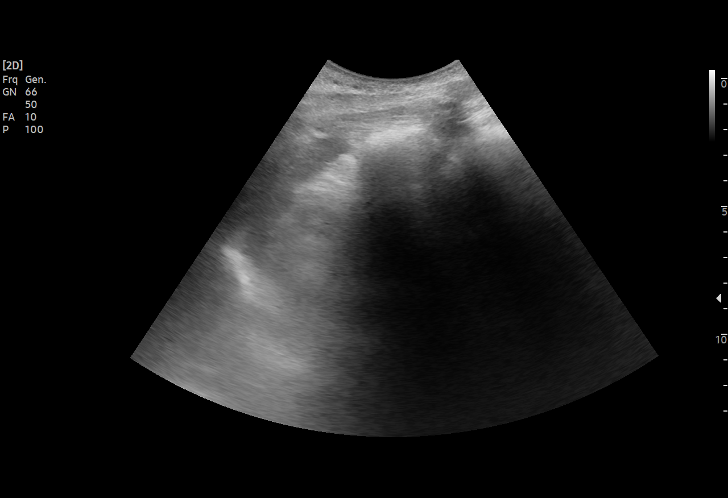

[15 of 25 positions shown; findings below may reference images not displayed]

FINDINGS: Right Kidney:

Renal measurements: 10.2 x 4.0 x 4.3 cm = volume: 92.2 mL. Increased
parenchymal echogenicity. No mass or hydronephrosis identified.

Left Kidney:

Renal measurements: Sub optimal visualization of the left kidney due
to overlying bowel gas.

Bladder:

Appears normal for degree of bladder distention.

Other:

Exam detail diminished due to bowel gas and patient inability to
take deep breaths.
IMPRESSION: 1. No acute findings. No hydronephrosis.
2. Increased echogenicity of the right kidney compatible with
chronic medical renal disease.
3. Suboptimal visualization of the left kidney due to overlying
bowel gas and patient inability to take deep breaths.

## 2021-11-25 MED ORDER — METRONIDAZOLE 500 MG/100ML IV SOLN
500.0000 mg | Freq: Two times a day (BID) | INTRAVENOUS | Status: DC
  Administered 2021-11-25: 500 mg via INTRAVENOUS
  Filled 2021-11-25: qty 100

## 2021-11-25 MED ORDER — HEPARIN SODIUM (PORCINE) 5000 UNIT/ML IJ SOLN
5000.0000 [IU] | Freq: Three times a day (TID) | INTRAMUSCULAR | Status: DC
  Administered 2021-11-25 – 2021-11-27 (×7): 5000 [IU] via SUBCUTANEOUS
  Filled 2021-11-25 (×7): qty 1

## 2021-11-25 MED ORDER — ADULT MULTIVITAMIN W/MINERALS CH
1.0000 | ORAL_TABLET | Freq: Every day | ORAL | Status: DC
  Administered 2021-11-25 – 2021-11-27 (×3): 1 via ORAL
  Filled 2021-11-25 (×3): qty 1

## 2021-11-25 MED ORDER — ASPIRIN 81 MG PO CHEW
81.0000 mg | CHEWABLE_TABLET | Freq: Every day | ORAL | Status: DC
  Administered 2021-11-25 – 2021-11-27 (×3): 81 mg via ORAL
  Filled 2021-11-25 (×3): qty 1

## 2021-11-25 MED ORDER — ACETAMINOPHEN 650 MG RE SUPP: 650.0000 mg | Freq: Four times a day (QID) | RECTAL | Status: DC | PRN

## 2021-11-25 MED ORDER — GLUCERNA SHAKE PO LIQD
237.0000 mL | Freq: Two times a day (BID) | ORAL | Status: DC
  Administered 2021-11-25 – 2021-11-27 (×5): 237 mL via ORAL
  Filled 2021-11-25 (×6): qty 237

## 2021-11-25 MED ORDER — MELATONIN 3 MG PO TABS
3.0000 mg | ORAL_TABLET | Freq: Every day | ORAL | Status: DC
Start: 2021-11-25 — End: 2021-11-27
  Administered 2021-11-25 – 2021-11-26 (×3): 3 mg via ORAL
  Filled 2021-11-25 (×3): qty 1

## 2021-11-25 MED ORDER — ACETAMINOPHEN 325 MG PO TABS
650.0000 mg | ORAL_TABLET | Freq: Four times a day (QID) | ORAL | Status: DC | PRN
  Administered 2021-11-26 (×3): 650 mg via ORAL
  Filled 2021-11-25 (×3): qty 2

## 2021-11-25 MED ORDER — SODIUM CHLORIDE 0.9 % IV SOLN
1.0000 g | Freq: Two times a day (BID) | INTRAVENOUS | Status: DC
  Administered 2021-11-25 (×2): 1 g via INTRAVENOUS
  Filled 2021-11-25 (×3): qty 20

## 2021-11-25 MED ORDER — SODIUM CHLORIDE 0.9 % IV SOLN: INTRAVENOUS | Status: DC

## 2021-11-25 NOTE — Assessment & Plan Note (Addendum)
-   Presented with fever (104.5), tachypnea, encephalopathy, and hypotension.  ?-Urine culture has speciated with group B strep, this is her presumed source at this time.  Severely elevated procalcitonin has down trended since admission in reassurance.  Initially started on vancomycin, cefepime, Flagyl which was transitioned to meropenem due to hx ESBL infection.  Overall clinical response has been good.  Given culture results with GBS, she was de-escalated to amoxicillin on 5/14 and will complete a 7 day course total  ?-Mentation has improved.  Family bedside also in agreement ?-Blood cultures negative to date ?

## 2021-11-25 NOTE — Assessment & Plan Note (Addendum)
-   Continue SSI and CBG monitoring ?-A1c 6.4% on admission ?-On metformin at home but CKD may preclude continuing this at discharge ?- continue diet control; decreased metformin to 500 mg daily; if any further deterioration of renal function in the future, would consider discontinuation ?

## 2021-11-25 NOTE — Progress Notes (Signed)
?Progress Note ? ? ? ?Penny [name]   ?FYB:[id number]  ?DOB: 11/18/1934  ?DOA: 11/24/2021     1 ?PCP: Penny Brothers, MD ? ?Initial CC: Fever, altered mentation ? ?Hospital Course: ?Penny [name] is an 87 yo female with PMH CKD3, DMII, dementia who presented with fever and altered mentation.  She was referred to the hospital from her SNF due to fever and altered mentation.  She was admitted for further work-up regarding possible infection. ? ?Interval History:  ?Resting in bed when seen this morning.  Staff present in room as well.  She was mostly complaining of pain in both legs and tender to light palpation.  No obvious open wounds or sores.  She had no other alarming symptoms in general on repeat review of systems. ? ?Assessment and Plan: ?* SIRS (systemic inflammatory response syndrome) (HCC) ?- Presented with fever (104.5), tachypnea, hypotension. This morning she has leukocytosis (0% bands however) not present on admission. PCT this morning also elevated (65).  No definitive source at this time.  Suspicion was for possible lower extremity cellulitis but appearance this morning looks to be more chronic stasis in nature and she is equally painful in both legs.  Denies urinary symptoms no respiratory symptoms.  She does have a history of ESBL infections, last in December 2022. ?-Originally was started on vancomycin, cefepime, Flagyl on admission.  Due to her excessively elevated PCT and unclear source if any along with history of ESBL, will change antibiotics to meropenem for now and monitor closely over the next 24 hours ?-Follow-up urine (GBS noted) and blood cultures ?-Trend procalcitonin tomorrow ? ?Acute metabolic encephalopathy ?- Unclear how much of this truly is acute versus her underlying dementia ?- Continue meropenem and monitor for any mentation improvement ? ?Acute renal failure superimposed on stage 3a chronic kidney disease ([place]) ?- patient has history of CKD3a. Baseline creat ~ 1, eGFR 52-59 ?- patient  presents with increase in creat >0.3 mg/dL above baseline, creat increase >1.5x baseline presumed to have occurred within past 7 days PTA ?-Possibly prerenal given BUN creatinine ratio ?- Continue fluids ?- Repeat BMP in a.m. ? ?HTN (hypertension) ?- Low/normal since admission ? ?Anemia due to chronic kidney disease ?- Baseline hemoglobin 8 to 9 g/dL ?- Currently at baseline and no overt bleeding ? ?Senile dementia without behavioral disturbance ([place]) ?Monitor mentation while on antibiotics ?- Continue nightly melatonin ? ?Type 2 diabetes mellitus with vascular disease ([place]) ?- Continue SSI and CBG monitoring ? ? ?Old records reviewed in assessment of this patient ? ?Antimicrobials: ?Vancomycin 11/24/2021 x 1 ?Cefepime 11/24/2021 >> 11/25/2021 ?Flagyl 11/24/2021 >> 11/25/2021 ?Meropenem 11/25/2021 >> current ? ?DVT prophylaxis:  ?heparin injection 5,000 Units Start: 11/25/21 0600 ? ? ?Code Status:   Code Status: DNR ? ?Disposition Plan: Pending clinical improvement back to SNF in 2 to 3 days ?Status is: Inpatient  ? ?Objective: ?Blood pressure 123/75, pulse 70, temperature 99.5 ?F (37.5 ?C), temperature source Oral, resp. rate 16, height '5\' 7"'  (1.702 m), weight 63 kg, SpO2 100 %.  ?Examination:  ?Physical Exam ?Constitutional:   ?   Comments: Chronically ill elderly woman lying in bed in no distress  ?HENT:  ?   Head: Normocephalic and atraumatic.  ?   Mouth/Throat:  ?   Mouth: Mucous membranes are moist.  ?Eyes:  ?   Extraocular Movements: Extraocular movements intact.  ?Cardiovascular:  ?   Rate and Rhythm: Normal rate and regular rhythm.  ?Pulmonary:  ?   Effort: No respiratory  distress.  ?   Breath sounds: Normal breath sounds. No wheezing.  ?Abdominal:  ?   General: Bowel sounds are normal. There is no distension.  ?   Palpations: Abdomen is soft.  ?   Tenderness: There is no abdominal tenderness.  ?Musculoskeletal:     ?   General: Swelling present.  ?   Cervical back: Normal range of motion and neck supple.  ?    Comments: 1-2+ bilateral lower extremity pitting edema with significant tenderness with light palpation.  Very mild erythema noted with no demarcations.  ?Skin: ?   General: Skin is warm.  ?Neurological:  ?   Mental Status: She is disoriented.  ?Psychiatric:     ?   Mood and Affect: Mood normal.  ?  ? ?Consultants:  ? ? ?Procedures:  ? ? ?Data Reviewed: ?Results for orders placed or performed during the hospital encounter of 11/24/21 (from the past 24 hour(s))  ?Hemoglobin A1c     Status: Abnormal  ? Collection Time: 11/24/21  3:20 PM  ?Result Value Ref Range  ? Hgb A1c MFr Bld 6.4 (H) 4.8 - 5.6 %  ? Mean Plasma Glucose 136.98 mg/dL  ?CBG monitoring, ED     Status: Abnormal  ? Collection Time: 11/24/21  4:46 PM  ?Result Value Ref Range  ? Glucose-Capillary 183 (H) 70 - 99 mg/dL  ?CBG monitoring, ED     Status: Abnormal  ? Collection Time: 11/24/21 10:52 PM  ?Result Value Ref Range  ? Glucose-Capillary 149 (H) 70 - 99 mg/dL  ?Protime-INR     Status: Abnormal  ? Collection Time: 11/25/21  2:26 AM  ?Result Value Ref Range  ? Prothrombin Time 16.2 (H) 11.4 - 15.2 seconds  ? INR 1.3 (H) 0.8 - 1.2  ?Cortisol-am, blood     Status: Abnormal  ? Collection Time: 11/25/21  2:26 AM  ?Result Value Ref Range  ? Cortisol - AM 24.0 (H) 6.7 - 22.6 ug/dL  ?Procalcitonin     Status: None  ? Collection Time: 11/25/21  2:26 AM  ?Result Value Ref Range  ? Procalcitonin 65.64 ng/mL  ?Comprehensive metabolic panel     Status: Abnormal  ? Collection Time: 11/25/21  2:26 AM  ?Result Value Ref Range  ? Sodium 135 135 - 145 mmol/L  ? Potassium 4.4 3.5 - 5.1 mmol/L  ? Chloride 104 98 - 111 mmol/L  ? CO2 24 22 - 32 mmol/L  ? Glucose, Bld 139 (H) 70 - 99 mg/dL  ? BUN 58 (H) 8 - 23 mg/dL  ? Creatinine, Ser 1.71 (H) 0.44 - 1.00 mg/dL  ? Calcium 8.3 (L) 8.9 - 10.3 mg/dL  ? Total Protein 7.1 6.5 - 8.1 g/dL  ? Albumin 2.3 (L) 3.5 - 5.0 g/dL  ? AST 19 15 - 41 U/L  ? ALT 14 0 - 44 U/L  ? Alkaline Phosphatase 59 38 - 126 U/L  ? Total Bilirubin 1.0 0.3 -  1.2 mg/dL  ? GFR, Estimated 29 (L) >60 mL/min  ? Anion gap 7 5 - 15  ?CBC     Status: Abnormal  ? Collection Time: 11/25/21  2:26 AM  ?Result Value Ref Range  ? WBC 13.5 (H) 4.0 - 10.5 K/uL  ? RBC 3.15 (L) 3.87 - 5.11 MIL/uL  ? Hemoglobin 9.7 (L) 12.0 - 15.0 g/dL  ? HCT 31.6 (L) 36.0 - 46.0 %  ? MCV 100.3 (H) 80.0 - 100.0 fL  ? MCH 30.8 26.0 - 34.0 pg  ?  MCHC 30.7 30.0 - 36.0 g/dL  ? RDW 14.6 11.5 - 15.5 %  ? Platelets 179 150 - 400 K/uL  ? nRBC 0.0 0.0 - 0.2 %  ?Iron and TIBC     Status: Abnormal  ? Collection Time: 11/25/21  2:26 AM  ?Result Value Ref Range  ? Iron 32 28 - 170 ug/dL  ? TIBC 166 (L) 250 - 450 ug/dL  ? Saturation Ratios 19 10.4 - 31.8 %  ? UIBC 134 ug/dL  ?Differential     Status: Abnormal  ? Collection Time: 11/25/21  2:26 AM  ?Result Value Ref Range  ? Neutrophils Relative % 94 %  ? Neutro Abs 12.8 (H) 1.7 - 7.7 K/uL  ? Lymphocytes Relative 2 %  ? Lymphs Abs 0.3 (L) 0.7 - 4.0 K/uL  ? Monocytes Relative 3 %  ? Monocytes Absolute 0.4 0.1 - 1.0 K/uL  ? Eosinophils Relative 1 %  ? Eosinophils Absolute 0.1 0.0 - 0.5 K/uL  ? Basophils Relative 0 %  ? Basophils Absolute 0.1 0.0 - 0.1 K/uL  ? Immature Granulocytes 0 %  ? Abs Immature Granulocytes 0.05 0.00 - 0.07 K/uL  ? Burr Cells PRESENT   ?Glucose, capillary     Status: Abnormal  ? Collection Time: 11/25/21  7:32 AM  ?Result Value Ref Range  ? Glucose-Capillary 121 (H) 70 - 99 mg/dL  ?Glucose, capillary     Status: Abnormal  ? Collection Time: 11/25/21 11:06 AM  ?Result Value Ref Range  ? Glucose-Capillary 158 (H) 70 - 99 mg/dL  ?  ?I have Reviewed nursing notes, Vitals, and Lab results since pt's last encounter. Pertinent lab results : see above ?I have ordered test including BMP, CBC, Mg ?I have reviewed the last note from staff over past 24 hours ?I have discussed pt's care plan and test results with nursing staff, case manager ? ? LOS: 1 day  ? ?Dwyane Dee, MD ?[facility] ?11/25/2021, 3:12 PM ?

## 2021-11-25 NOTE — Hospital Course (Addendum)
Penny [name] is an 87 yo female with PMH CKD3, DMII, dementia who presented with fever and altered mentation.  She was referred to the hospital from her SNF due to fever and altered mentation.  She was admitted for further work-up regarding possible infection. ?See below for further A&P.  ?

## 2021-11-25 NOTE — Assessment & Plan Note (Signed)
-   Baseline hemoglobin 8 to 9 g/dL ?- Currently at baseline and no overt bleeding ?

## 2021-11-25 NOTE — Assessment & Plan Note (Signed)
-   Low/normal since admission ?

## 2021-11-25 NOTE — Assessment & Plan Note (Addendum)
-   Unclear how much of this truly is acute versus her underlying dementia ?-Mentation back to baseline  ?

## 2021-11-25 NOTE — Assessment & Plan Note (Signed)
Monitor mentation while on antibiotics ?- Continue nightly melatonin ?

## 2021-11-25 NOTE — Assessment & Plan Note (Addendum)
-  patient has history of CKD3a. Baseline creat ~ 1, eGFR 52-59 ?- patient presents with increase in creat >0.3 mg/dL above baseline, creat increase >1.5x baseline presumed to have occurred within past 7 days PTA ?-Possibly prerenal given BUN creatinine ratio ?-Creatinine 1.7 on admission and has improved with fluids; okay to d/c fluids ?- creat 1.11 on day of discharge  ?

## 2021-11-26 DIAGNOSIS — A419 Sepsis, unspecified organism: Secondary | ICD-10-CM | POA: Diagnosis not present

## 2021-11-26 DIAGNOSIS — N179 Acute kidney failure, unspecified: Secondary | ICD-10-CM | POA: Diagnosis not present

## 2021-11-26 DIAGNOSIS — G9341 Metabolic encephalopathy: Secondary | ICD-10-CM | POA: Diagnosis not present

## 2021-11-26 DIAGNOSIS — R6520 Severe sepsis without septic shock: Secondary | ICD-10-CM | POA: Diagnosis not present

## 2021-11-26 LAB — MAGNESIUM: Magnesium: 1.9 mg/dL (ref 1.7–2.4)

## 2021-11-26 LAB — GLUCOSE, CAPILLARY
Glucose-Capillary: 215 mg/dL — ABNORMAL HIGH (ref 70–99)
Glucose-Capillary: 194 mg/dL — ABNORMAL HIGH (ref 70–99)
Glucose-Capillary: 284 mg/dL — ABNORMAL HIGH (ref 70–99)
Glucose-Capillary: 206 mg/dL — ABNORMAL HIGH (ref 70–99)

## 2021-11-26 LAB — CBC WITH DIFFERENTIAL/PLATELET
Abs Immature Granulocytes: 0.48 10*3/uL — ABNORMAL HIGH (ref 0.00–0.07)
Basophils Absolute: 0 10*3/uL (ref 0.0–0.1)
Basophils Relative: 0 %
Eosinophils Absolute: 0.2 10*3/uL (ref 0.0–0.5)
Eosinophils Relative: 2 %
HCT: 26.1 % — ABNORMAL LOW (ref 36.0–46.0)
Hemoglobin: 8.1 g/dL — ABNORMAL LOW (ref 12.0–15.0)
Immature Granulocytes: 3 %
Lymphocytes Relative: 3 %
Lymphs Abs: 0.5 10*3/uL — ABNORMAL LOW (ref 0.7–4.0)
MCH: 30.8 pg (ref 26.0–34.0)
MCHC: 31 g/dL (ref 30.0–36.0)
MCV: 99.2 fL (ref 80.0–100.0)
Monocytes Absolute: 0.5 10*3/uL (ref 0.1–1.0)
Monocytes Relative: 3 %
Neutro Abs: 14 10*3/uL — ABNORMAL HIGH (ref 1.7–7.7)
Neutrophils Relative %: 89 %
Platelets: 171 10*3/uL (ref 150–400)
RBC: 2.63 MIL/uL — ABNORMAL LOW (ref 3.87–5.11)
RDW: 14.7 % (ref 11.5–15.5)
WBC: 15.7 10*3/uL — ABNORMAL HIGH (ref 4.0–10.5)
nRBC: 0 % (ref 0.0–0.2)

## 2021-11-26 LAB — PROCALCITONIN: Procalcitonin: 44.06 ng/mL

## 2021-11-26 LAB — BASIC METABOLIC PANEL
Anion gap: 6 (ref 5–15)
BUN: 56 mg/dL — ABNORMAL HIGH (ref 8–23)
CO2: 23 mmol/L (ref 22–32)
Calcium: 8.2 mg/dL — ABNORMAL LOW (ref 8.9–10.3)
Chloride: 109 mmol/L (ref 98–111)
Creatinine, Ser: 1.24 mg/dL — ABNORMAL HIGH (ref 0.44–1.00)
GFR, Estimated: 43 mL/min — ABNORMAL LOW (ref >60)
Glucose, Bld: 227 mg/dL — ABNORMAL HIGH (ref 70–99)
Potassium: 3.6 mmol/L (ref 3.5–5.1)
Sodium: 138 mmol/L (ref 135–145)

## 2021-11-26 MED ORDER — AMOXICILLIN 250 MG PO CAPS
500.0000 mg | ORAL_CAPSULE | Freq: Three times a day (TID) | ORAL | Status: DC
  Administered 2021-11-26 – 2021-11-27 (×3): 500 mg via ORAL
  Filled 2021-11-26 (×3): qty 2

## 2021-11-26 MED ORDER — HYDROXYZINE HCL 10 MG PO TABS
10.0000 mg | ORAL_TABLET | Freq: Every day | ORAL | Status: DC
Start: 2021-11-26 — End: 2021-11-27
  Administered 2021-11-26: 10 mg via ORAL
  Filled 2021-11-26 (×2): qty 1

## 2021-11-26 NOTE — [place] FL2 (Signed)
?[place] MEDICAID FL2 LEVEL OF CARE SCREENING TOOL  ?  ? ?IDENTIFICATION  ?Patient Name: ?Penny [name] Birthdate: 24-Jul-1934 Sex: female Admission Date (Current Location): ?11/24/2021  ?[place] and [place] Number: ? [place] ?[id number] R Facility and Address:  ?[facility],  [address] [place], [place] ?     Provider Number: ?[id number]  ?Attending Physician Name and Address:  ?Dwyane Dee, MD ? Relative Name and Phone Number:  ?Johnson,Valerie Niece   [phone] ?   ?Current Level of Care: ?Hospital Recommended Level of Care: ?[place] Prior Approval Number: ?  ? ?Date Approved/Denied: ?  PASRR Number: ?[id number] ? ?Discharge Plan: ?SNF ?  ? ?Current Diagnoses: ?Patient Active Problem List  ? Diagnosis Date Noted  ? Severe sepsis ([place]) 11/24/2021  ? HTN (hypertension) 11/24/2021  ? Pressure injury of skin 07/11/2021  ? Thyroid nodule 07/11/2021  ? Cellulitis of lower extremity 07/09/2021  ? AKI (acute kidney injury) ([place]) 07/09/2021  ? Anemia due to chronic kidney disease 07/09/2021  ? Edema of left lower extremity 07/06/2021  ? Pulmonary nodule 1 cm or greater in diameter 07/06/2021  ? Condyloma acuminatum 07/06/2021  ? Protein-calorie malnutrition, severe 05/11/2021  ? Acute prerenal azotemia 05/09/2021  ? Acute on chronic anemia 05/09/2021  ? Senile dementia without behavioral disturbance ([place])   ? Pressure injury of right hip, stage 4 ([place])   ? Complicated open wound of right hip 05/08/2021  ? Malnutrition of moderate degree 04/21/2021  ? Acute metabolic encephalopathy 45/80/9983  ? Hypothermia due to exposure 04/17/2021  ? Pressure ulcer, shoulder blades, stage II ([place]) - right posterior shoulder 04/17/2021  ? Pressure ulcer, hip, right, unstageable ([place]) 04/17/2021  ? Acute renal failure superimposed on stage 3a chronic kidney disease ([place]) 04/17/2021  ? Pain due to onychomycosis of toenails of both feet 02/01/2021  ? Type 2 diabetes mellitus with vascular disease  ([place]) 02/01/2021  ? ? ?Orientation RESPIRATION BLADDER Height & Weight   ?  ?  ? Normal Incontinent Weight: 139 lb (63 kg) ?Height:  '5\' 7"'$  (170.2 cm)  ?BEHAVIORAL SYMPTOMS/MOOD NEUROLOGICAL BOWEL NUTRITION STATUS  ?    Incontinent Diet  ?AMBULATORY STATUS COMMUNICATION OF NEEDS Skin   ?Extensive Assist Verbally Surgical wounds ?  ?  ?  ?    ?     ?     ? ? ?Personal Care Assistance Level of Assistance  ?Feeding, Dressing   ?Feeding assistance: Maximum assistance ?Dressing Assistance: Maximum assistance ?   ? ?Functional Limitations Info  ?Sight, Hearing, Speech Sight Info: Adequate ?Hearing Info: Adequate ?Speech Info: Adequate  ? ? ?SPECIAL CARE FACTORS FREQUENCY  ?    ?  ?  ?  ?  ?  ?  ?   ? ? ?Contractures Contractures Info: Not present  ? ? ?Additional Factors Info  ?Code Status, Allergies, Insulin Sliding Scale Code Status Info: DNR ?Allergies Info: Glipizide ?  ?Insulin Sliding Scale Info: insulin aspart (novoLOG) injection 0-9 Units 3x a day with meals. ?  ?   ? ?Current Medications (11/26/2021):  This is the current hospital active medication list ?Current Facility-Administered Medications  ?Medication Dose Route Frequency Provider Last Rate Last Admin  ? acetaminophen (TYLENOL) tablet 650 mg  650 mg Oral Q6H PRN Marylyn Ishihara, Tyrone A, DO   650 mg at 11/26/21 1209  ? Or  ? acetaminophen (TYLENOL) suppository 650 mg  650 mg Rectal Q6H PRN Marylyn Ishihara, Tyrone A, DO      ? amoxicillin (AMOXIL) capsule  500 mg  500 mg Oral TID Dwyane Dee, MD   500 mg at 11/26/21 1511  ? aspirin chewable tablet 81 mg  81 mg Oral Daily Kyle, Tyrone A, DO   81 mg at 11/26/21 0851  ? feeding supplement (GLUCERNA SHAKE) (GLUCERNA SHAKE) liquid 237 mL  237 mL Oral BID BM Kyle, Tyrone A, DO   237 mL at 11/26/21 1511  ? heparin injection 5,000 Units  5,000 Units Subcutaneous Q8H Kyle, Tyrone A, DO   5,000 Units at 11/26/21 1511  ? hydrOXYzine (ATARAX) tablet 10 mg  10 mg Oral Q1500 Dwyane Dee, MD   10 mg at 11/26/21 1511  ? insulin aspart  (novoLOG) injection 0-5 Units  0-5 Units Subcutaneous QHS Kyle, Tyrone A, DO      ? insulin aspart (novoLOG) injection 0-9 Units  0-9 Units Subcutaneous TID WC Kyle, Tyrone A, DO   2 Units at 11/26/21 1208  ? melatonin tablet 3 mg  3 mg Oral QHS Kyle, Tyrone A, DO   3 mg at 11/25/21 2127  ? multivitamin with minerals tablet 1 tablet  1 tablet Oral Daily Kyle, Tyrone A, DO   1 tablet at 11/26/21 5697  ? ? ? ?Discharge Medications: ?Please see discharge summary for a list of discharge medications. ? ?Relevant Imaging Results: ? ?Relevant Lab Results: ? ? ?Additional Information ?SSN [id number] ? ?Ross Ludwig, LCSW ? ? ? ? ?

## 2021-11-26 NOTE — TOC Initial Note (Signed)
Transition of Care (TOC) - Initial/Assessment Note  ? ? ?Patient Details  ?Name: Penny [name] ?MRN: [id number] ?Date of Birth: 24-Jul-1934 ? ?Transition of Care (TOC) CM/SW Contact:    ?Ross Ludwig, LCSW ?Phone Number: ?11/26/2021, 3:46 PM ? ?Clinical Narrative:                 ? ?Patient is from [employer] and is a LTC resident at [facility].  CSW attempted to contact Juliann Pulse at [facility] to confirm patient is a LTC resident but had to leave a message, waiting on call back.  Per ED notes, patient plans to return back to SNF once she is medically ready for discharge. ? ?Expected Discharge Plan: [place] ?Barriers to Discharge: Continued Medical Work up ? ? ?Patient Goals and CMS Choice ?Patient states their goals for this hospitalization and ongoing recovery are:: To return back to [facility]. ?CMS Medicare.gov Compare Post Acute Care list provided to:: Patient ?Choice offered to / list presented to : Patient ? ?Expected Discharge Plan and Services ?Expected Discharge Plan: [place] ?  ?  ?  ?Living arrangements for the past 2 months: [place] ?                ?  ?  ?  ?  ?  ?  ?  ?  ?  ?  ? ?Prior Living Arrangements/Services ?Living arrangements for the past 2 months: [place] ?Lives with:: Facility Resident ?Patient language and need for interpreter reviewed:: Yes ?Do you feel safe going back to the place where you live?: Yes      ?Need for Family Participation in Patient Care: Yes (Comment) ?Care giver support system in place?: Yes (comment) ?  ?Criminal Activity/Legal Involvement Pertinent to Current Situation/Hospitalization: No - Comment as needed ? ?Activities of Daily Living ?Home Assistive Devices/Equipment: Other (Comment) (pt can't verbalize what medical equipment she uses at home) ?ADL Screening (condition at time of admission) ?Patient's cognitive ability adequate to safely complete daily activities?: No ?Is the  patient deaf or have difficulty hearing?: No ?Does the patient have difficulty seeing, even when wearing glasses/contacts?: No ?Does the patient have difficulty concentrating, remembering, or making decisions?: Yes ?Patient able to express need for assistance with ADLs?: No ?Does the patient have difficulty dressing or bathing?: Yes ?Independently performs ADLs?: No ?Communication: Needs assistance ?Is this a change from baseline?: Pre-admission baseline ?Dressing (OT): Needs assistance ?Is this a change from baseline?: Pre-admission baseline ?Grooming: Needs assistance ?Is this a change from baseline?: Pre-admission baseline ?Feeding: Needs assistance ?Is this a change from baseline?: Pre-admission baseline ?Bathing: Needs assistance ?Is this a change from baseline?: Pre-admission baseline ?Toileting: Needs assistance ?Is this a change from baseline?: Pre-admission baseline ?In/Out Bed: Needs assistance ?Is this a change from baseline?: Pre-admission baseline ?Walks in Home: Needs assistance ?Is this a change from baseline?: Pre-admission baseline ?Does the patient have difficulty walking or climbing stairs?: Yes ?Weakness of Legs: Both ?Weakness of Arms/Hands: Both ? ?Permission Sought/Granted ?Permission sought to share information with : Case Manager, Customer service manager, Family Supports ?Permission granted to share information with : Yes, Release of Information Signed ? Share Information with NAME: Johnson,Valerie Niece   [phone] ? Permission granted to share info w AGENCY: SNF admissions ?   ?   ? ?Emotional Assessment ?Appearance:: Appears stated age ?  ?Affect (typically observed): Calm, Appropriate, Accepting, Anxious, Stable ?  ?Alcohol / Substance Use: Illicit Drugs ?Psych Involvement:  No (comment) ? ?Admission diagnosis:  AKI (acute kidney injury) ([place]) [N17.9] ?Sepsis ([place]) [A41.9] ?Urinary tract infection without hematuria, site unspecified [N39.0] ?Sepsis with acute organ dysfunction,  due to unspecified organism, unspecified type, unspecified whether septic shock present ([place]) [A41.9, R65.20] ?Patient Active Problem List  ? Diagnosis Date Noted  ? Severe sepsis ([place]) 11/24/2021  ? HTN (hypertension) 11/24/2021  ? Pressure injury of skin 07/11/2021  ? Thyroid nodule 07/11/2021  ? Cellulitis of lower extremity 07/09/2021  ? AKI (acute kidney injury) ([place]) 07/09/2021  ? Anemia due to chronic kidney disease 07/09/2021  ? Edema of left lower extremity 07/06/2021  ? Pulmonary nodule 1 cm or greater in diameter 07/06/2021  ? Condyloma acuminatum 07/06/2021  ? Protein-calorie malnutrition, severe 05/11/2021  ? Acute prerenal azotemia 05/09/2021  ? Acute on chronic anemia 05/09/2021  ? Senile dementia without behavioral disturbance ([place])   ? Pressure injury of right hip, stage 4 ([place])   ? Complicated open wound of right hip 05/08/2021  ? Malnutrition of moderate degree 04/21/2021  ? Acute metabolic encephalopathy 18/84/1660  ? Hypothermia due to exposure 04/17/2021  ? Pressure ulcer, shoulder blades, stage II ([place]) - right posterior shoulder 04/17/2021  ? Pressure ulcer, hip, right, unstageable ([place]) 04/17/2021  ? Acute renal failure superimposed on stage 3a chronic kidney disease ([place]) 04/17/2021  ? Pain due to onychomycosis of toenails of both feet 02/01/2021  ? Type 2 diabetes mellitus with vascular disease ([place]) 02/01/2021  ? ?PCP:  Garwin Brothers, MD ?Pharmacy:  No Pharmacies Listed ? ? ? ?Social Determinants of Health (SDOH) Interventions ?  ? ?Readmission Risk Interventions ?   ? View : No data to display.  ?  ?  ?  ? ? ? ?

## 2021-11-26 NOTE — Progress Notes (Signed)
?Progress Note ? ? ? ?Kerby D Tremont   ?LTJ:[id number]  ?DOB: [birth year]  ?DOA: 11/24/2021     2 ?PCP: Garwin Brothers, MD ? ?Initial CC: Fever, altered mentation ? ?Hospital Course: ?Ms. Stroder is an 87 yo female with PMH CKD3, DMII, dementia who presented with fever and altered mentation.  She was referred to the hospital from her SNF due to fever and altered mentation.  She was admitted for further work-up regarding possible infection. ? ?Interval History:  ?No further fever since admission and mentation is much improved today.  Family member bedside this morning and updated. ?Patient otherwise feels better when seen and we are tentatively planning on discharge tomorrow. ? ?Assessment and Plan: ?* Severe sepsis ([place]) ?- Presented with fever (104.5), tachypnea, encephalopathy, and hypotension.  ?-Urine culture has speciated with group B strep, this is her presumed source at this time.  Severely elevated procalcitonin has down trended some since admission.  Initially started on vancomycin, cefepime, Flagyl which was transitioned to meropenem yesterday.  Overall clinical response has been good.  Given culture results, will de-escalate today down to amoxicillin and complete targeted course ?-Mentation has improved.  Family bedside also in agreement ?-Continue following up blood cultures ? ?Acute metabolic encephalopathy ?- Unclear how much of this truly is acute versus her underlying dementia ?-Mentation is better today. See above  ? ?Acute renal failure superimposed on stage 3a chronic kidney disease ([place]) ?- patient has history of CKD3a. Baseline creat ~ 1, eGFR 52-59 ?- patient presents with increase in creat >0.3 mg/dL above baseline, creat increase >1.5x baseline presumed to have occurred within past 7 days PTA ?-Possibly prerenal given BUN creatinine ratio ?-Creatinine 1.7 on admission and has improved with fluids; okay to d/c fluids today ?- Repeat BMP in a.m. ? ?HTN (hypertension) ?- Low/normal since admission ? ?Anemia  due to chronic kidney disease ?- Baseline hemoglobin 8 to 9 g/dL ?- Currently at baseline and no overt bleeding ? ?Senile dementia without behavioral disturbance ([place]) ?Monitor mentation while on antibiotics ?- Continue nightly melatonin ? ?Type 2 diabetes mellitus with vascular disease ([place]) ?- Continue SSI and CBG monitoring ?-A1c 6.4% on admission ?-On metformin at home but CKD may preclude continuing this at discharge ? ? ?Old records reviewed in assessment of this patient ? ?Antimicrobials: ?Vancomycin 11/24/2021 x 1 ?Cefepime 11/24/2021 >> 11/25/2021 ?Flagyl 11/24/2021 >> 11/25/2021 ?Meropenem 11/25/2021 >> 11/26/2021 ?Amoxicillin 11/26/2021 >> current ? ?DVT prophylaxis:  ?heparin injection 5,000 Units Start: 11/25/21 0600 ? ? ?Code Status:   Code Status: DNR ? ?Disposition Plan: Back to SNF Monday ?Status is: Inpatient  ? ?Objective: ?Blood pressure (!) 108/59, pulse 61, temperature 98.9 ?F (37.2 ?C), temperature source Oral, resp. rate 18, height _0  (1.702 m), weight 63 kg, SpO2 97 %.  ?Examination:  ?Physical Exam ?Constitutional:   ?   Comments: Chronically ill elderly woman lying in bed in no distress  ?HENT:  ?   Head: Normocephalic and atraumatic.  ?   Mouth/Throat:  ?   Mouth: Mucous membranes are moist.  ?Eyes:  ?   Extraocular Movements: Extraocular movements intact.  ?Cardiovascular:  ?   Rate and Rhythm: Normal rate and regular rhythm.  ?Pulmonary:  ?   Effort: No respiratory distress.  ?   Breath sounds: Normal breath sounds. No wheezing.  ?Abdominal:  ?   General: Bowel sounds are normal. There is no distension.  ?   Palpations: Abdomen is soft.  ?   Tenderness: There is no abdominal  tenderness.  ?Musculoskeletal:     ?   General: Swelling present.  ?   Cervical back: Normal range of motion and neck supple.  ?   Comments: 1-2+ bilateral lower extremity pitting edema with tenderness with light palpation.  Very mild erythema noted with no demarcations.  ?Skin: ?   General: Skin is warm.  ?Neurological:   ?   General: No focal deficit present.  ?   Mental Status: Mental status is at baseline.  ?Psychiatric:     ?   Mood and Affect: Mood normal.  ?  ? ?Consultants:  ? ? ?Procedures:  ? ? ?Data Reviewed: ?Results for orders placed or performed during the hospital encounter of 11/24/21 (from the past 24 hour(s))  ?Glucose, capillary     Status: Abnormal  ? Collection Time: 11/25/21  4:35 PM  ?Result Value Ref Range  ? Glucose-Capillary 178 (H) 70 - 99 mg/dL  ?Glucose, capillary     Status: Abnormal  ? Collection Time: 11/25/21  8:59 PM  ?Result Value Ref Range  ? Glucose-Capillary 102 (H) 70 - 99 mg/dL  ?CBC with Differential/Platelet     Status: Abnormal  ? Collection Time: 11/26/21  4:46 AM  ?Result Value Ref Range  ? WBC 15.7 (H) 4.0 - 10.5 K/uL  ? RBC 2.63 (L) 3.87 - 5.11 MIL/uL  ? Hemoglobin 8.1 (L) 12.0 - 15.0 g/dL  ? HCT 26.1 (L) 36.0 - 46.0 %  ? MCV 99.2 80.0 - 100.0 fL  ? MCH 30.8 26.0 - 34.0 pg  ? MCHC 31.0 30.0 - 36.0 g/dL  ? RDW 14.7 11.5 - 15.5 %  ? Platelets 171 150 - 400 K/uL  ? nRBC 0.0 0.0 - 0.2 %  ? Neutrophils Relative % 89 %  ? Neutro Abs 14.0 (H) 1.7 - 7.7 K/uL  ? Lymphocytes Relative 3 %  ? Lymphs Abs 0.5 (L) 0.7 - 4.0 K/uL  ? Monocytes Relative 3 %  ? Monocytes Absolute 0.5 0.1 - 1.0 K/uL  ? Eosinophils Relative 2 %  ? Eosinophils Absolute 0.2 0.0 - 0.5 K/uL  ? Basophils Relative 0 %  ? Basophils Absolute 0.0 0.0 - 0.1 K/uL  ? Immature Granulocytes 3 %  ? Abs Immature Granulocytes 0.48 (H) 0.00 - 0.07 K/uL  ?Basic metabolic panel     Status: Abnormal  ? Collection Time: 11/26/21  4:46 AM  ?Result Value Ref Range  ? Sodium 138 135 - 145 mmol/L  ? Potassium 3.6 3.5 - 5.1 mmol/L  ? Chloride 109 98 - 111 mmol/L  ? CO2 23 22 - 32 mmol/L  ? Glucose, Bld 227 (H) 70 - 99 mg/dL  ? BUN 56 (H) 8 - 23 mg/dL  ? Creatinine, Ser 1.24 (H) 0.44 - 1.00 mg/dL  ? Calcium 8.2 (L) 8.9 - 10.3 mg/dL  ? GFR, Estimated 43 (L) >60 mL/min  ? Anion gap 6 5 - 15  ?Magnesium     Status: None  ? Collection Time: 11/26/21  4:46  AM  ?Result Value Ref Range  ? Magnesium 1.9 1.7 - 2.4 mg/dL  ?Glucose, capillary     Status: Abnormal  ? Collection Time: 11/26/21  7:48 AM  ?Result Value Ref Range  ? Glucose-Capillary 215 (H) 70 - 99 mg/dL  ?Procalcitonin - Baseline     Status: None  ? Collection Time: 11/26/21  8:00 AM  ?Result Value Ref Range  ? Procalcitonin 44.06 ng/mL  ?Glucose, capillary     Status: Abnormal  ?  Collection Time: 11/26/21 11:29 AM  ?Result Value Ref Range  ? Glucose-Capillary 194 (H) 70 - 99 mg/dL  ?  ?I have Reviewed nursing notes, Vitals, and Lab results since pt's last encounter. Pertinent lab results : see above ?I have ordered test including BMP, CBC, Mg ?I have reviewed the last note from staff over past 24 hours ?I have discussed pt's care plan and test results with nursing staff, case manager ? ? LOS: 2 days  ? ?Dwyane Dee, MD ?[facility] ?11/26/2021, 11:38 AM ?

## 2021-11-27 DIAGNOSIS — E079 Disorder of thyroid, unspecified: Secondary | ICD-10-CM | POA: Diagnosis not present

## 2021-11-27 DIAGNOSIS — G3184 Mild cognitive impairment, so stated: Secondary | ICD-10-CM | POA: Diagnosis not present

## 2021-11-27 DIAGNOSIS — E1159 Type 2 diabetes mellitus with other circulatory complications: Secondary | ICD-10-CM | POA: Diagnosis not present

## 2021-11-27 DIAGNOSIS — L89313 Pressure ulcer of right buttock, stage 3: Secondary | ICD-10-CM | POA: Diagnosis not present

## 2021-11-27 DIAGNOSIS — D508 Other iron deficiency anemias: Secondary | ICD-10-CM | POA: Diagnosis not present

## 2021-11-27 DIAGNOSIS — S43021S Posterior subluxation of right humerus, sequela: Secondary | ICD-10-CM | POA: Diagnosis not present

## 2021-11-27 DIAGNOSIS — I129 Hypertensive chronic kidney disease with stage 1 through stage 4 chronic kidney disease, or unspecified chronic kidney disease: Secondary | ICD-10-CM | POA: Diagnosis not present

## 2021-11-27 DIAGNOSIS — R600 Localized edema: Secondary | ICD-10-CM | POA: Diagnosis not present

## 2021-11-27 DIAGNOSIS — R6520 Severe sepsis without septic shock: Secondary | ICD-10-CM | POA: Diagnosis not present

## 2021-11-27 DIAGNOSIS — M160 Bilateral primary osteoarthritis of hip: Secondary | ICD-10-CM | POA: Diagnosis not present

## 2021-11-27 DIAGNOSIS — M19011 Primary osteoarthritis, right shoulder: Secondary | ICD-10-CM | POA: Diagnosis not present

## 2021-11-27 DIAGNOSIS — L03115 Cellulitis of right lower limb: Secondary | ICD-10-CM | POA: Diagnosis not present

## 2021-11-27 DIAGNOSIS — Z743 Need for continuous supervision: Secondary | ICD-10-CM | POA: Diagnosis not present

## 2021-11-27 DIAGNOSIS — Z8673 Personal history of transient ischemic attack (TIA), and cerebral infarction without residual deficits: Secondary | ICD-10-CM | POA: Diagnosis not present

## 2021-11-27 DIAGNOSIS — Z7401 Bed confinement status: Secondary | ICD-10-CM | POA: Diagnosis not present

## 2021-11-27 DIAGNOSIS — N390 Urinary tract infection, site not specified: Secondary | ICD-10-CM | POA: Diagnosis not present

## 2021-11-27 DIAGNOSIS — M6281 Muscle weakness (generalized): Secondary | ICD-10-CM | POA: Diagnosis not present

## 2021-11-27 DIAGNOSIS — L509 Urticaria, unspecified: Secondary | ICD-10-CM | POA: Diagnosis not present

## 2021-11-27 DIAGNOSIS — M79671 Pain in right foot: Secondary | ICD-10-CM | POA: Diagnosis not present

## 2021-11-27 DIAGNOSIS — R682 Dry mouth, unspecified: Secondary | ICD-10-CM | POA: Diagnosis not present

## 2021-11-27 DIAGNOSIS — N1831 Chronic kidney disease, stage 3a: Secondary | ICD-10-CM | POA: Diagnosis not present

## 2021-11-27 DIAGNOSIS — L89310 Pressure ulcer of right buttock, unstageable: Secondary | ICD-10-CM | POA: Diagnosis not present

## 2021-11-27 DIAGNOSIS — D631 Anemia in chronic kidney disease: Secondary | ICD-10-CM | POA: Diagnosis not present

## 2021-11-27 DIAGNOSIS — A419 Sepsis, unspecified organism: Secondary | ICD-10-CM | POA: Diagnosis not present

## 2021-11-27 DIAGNOSIS — M79661 Pain in right lower leg: Secondary | ICD-10-CM | POA: Diagnosis not present

## 2021-11-27 DIAGNOSIS — G9341 Metabolic encephalopathy: Secondary | ICD-10-CM | POA: Diagnosis not present

## 2021-11-27 DIAGNOSIS — M25571 Pain in right ankle and joints of right foot: Secondary | ICD-10-CM | POA: Diagnosis not present

## 2021-11-27 DIAGNOSIS — L89626 Pressure-induced deep tissue damage of left heel: Secondary | ICD-10-CM | POA: Diagnosis not present

## 2021-11-27 DIAGNOSIS — E43 Unspecified severe protein-calorie malnutrition: Secondary | ICD-10-CM | POA: Diagnosis not present

## 2021-11-27 DIAGNOSIS — R52 Pain, unspecified: Secondary | ICD-10-CM | POA: Diagnosis not present

## 2021-11-27 DIAGNOSIS — I10 Essential (primary) hypertension: Secondary | ICD-10-CM | POA: Diagnosis not present

## 2021-11-27 DIAGNOSIS — M5134 Other intervertebral disc degeneration, thoracic region: Secondary | ICD-10-CM | POA: Diagnosis not present

## 2021-11-27 DIAGNOSIS — I739 Peripheral vascular disease, unspecified: Secondary | ICD-10-CM | POA: Diagnosis not present

## 2021-11-27 DIAGNOSIS — L22 Diaper dermatitis: Secondary | ICD-10-CM | POA: Diagnosis not present

## 2021-11-27 DIAGNOSIS — N179 Acute kidney failure, unspecified: Secondary | ICD-10-CM | POA: Diagnosis not present

## 2021-11-27 DIAGNOSIS — R410 Disorientation, unspecified: Secondary | ICD-10-CM | POA: Diagnosis not present

## 2021-11-27 DIAGNOSIS — Z1159 Encounter for screening for other viral diseases: Secondary | ICD-10-CM | POA: Diagnosis not present

## 2021-11-27 DIAGNOSIS — M170 Bilateral primary osteoarthritis of knee: Secondary | ICD-10-CM | POA: Diagnosis not present

## 2021-11-27 DIAGNOSIS — L89616 Pressure-induced deep tissue damage of right heel: Secondary | ICD-10-CM | POA: Diagnosis not present

## 2021-11-27 LAB — BASIC METABOLIC PANEL
Anion gap: 5 (ref 5–15)
BUN: 46 mg/dL — ABNORMAL HIGH (ref 8–23)
CO2: 24 mmol/L (ref 22–32)
Calcium: 8.2 mg/dL — ABNORMAL LOW (ref 8.9–10.3)
Chloride: 107 mmol/L (ref 98–111)
Creatinine, Ser: 1.11 mg/dL — ABNORMAL HIGH (ref 0.44–1.00)
GFR, Estimated: 49 mL/min — ABNORMAL LOW (ref >60)
Glucose, Bld: 156 mg/dL — ABNORMAL HIGH (ref 70–99)
Potassium: 4 mmol/L (ref 3.5–5.1)
Sodium: 136 mmol/L (ref 135–145)

## 2021-11-27 LAB — CBC WITH DIFFERENTIAL/PLATELET
Abs Immature Granulocytes: 0.23 10*3/uL — ABNORMAL HIGH (ref 0.00–0.07)
Basophils Absolute: 0 10*3/uL (ref 0.0–0.1)
Basophils Relative: 0 %
Eosinophils Absolute: 0.8 10*3/uL — ABNORMAL HIGH (ref 0.0–0.5)
Eosinophils Relative: 5 %
HCT: 26.7 % — ABNORMAL LOW (ref 36.0–46.0)
Hemoglobin: 8.5 g/dL — ABNORMAL LOW (ref 12.0–15.0)
Immature Granulocytes: 1 %
Lymphocytes Relative: 5 %
Lymphs Abs: 0.9 10*3/uL (ref 0.7–4.0)
MCH: 31.4 pg (ref 26.0–34.0)
MCHC: 31.8 g/dL (ref 30.0–36.0)
MCV: 98.5 fL (ref 80.0–100.0)
Monocytes Absolute: 0.8 10*3/uL (ref 0.1–1.0)
Monocytes Relative: 5 %
Neutro Abs: 14.1 10*3/uL — ABNORMAL HIGH (ref 1.7–7.7)
Neutrophils Relative %: 84 %
Platelets: 187 10*3/uL (ref 150–400)
RBC: 2.71 MIL/uL — ABNORMAL LOW (ref 3.87–5.11)
RDW: 14.9 % (ref 11.5–15.5)
WBC: 16.9 10*3/uL — ABNORMAL HIGH (ref 4.0–10.5)
nRBC: 0 % (ref 0.0–0.2)

## 2021-11-27 LAB — GLUCOSE, CAPILLARY: Glucose-Capillary: 152 mg/dL — ABNORMAL HIGH (ref 70–99)

## 2021-11-27 LAB — MAGNESIUM: Magnesium: 1.9 mg/dL (ref 1.7–2.4)

## 2021-11-27 LAB — PROCALCITONIN: Procalcitonin: 29.06 ng/mL

## 2021-11-27 MED ORDER — AMOXICILLIN 500 MG PO CAPS: 500.0000 mg | ORAL_CAPSULE | Freq: Three times a day (TID) | ORAL | 0 refills | Status: AC

## 2021-11-27 MED ORDER — METFORMIN HCL 500 MG PO TABS: 500.0000 mg | ORAL_TABLET | Freq: Every day | ORAL | Status: AC

## 2021-11-27 NOTE — Progress Notes (Signed)
[name] at bedside. Niece, [name], called to make aware of discharge. Phone call went straight to voicemail. RN left voicemail stating pt was on the way back to [facility]. RN left callback number for any questions.  ?

## 2021-11-27 NOTE — Consult Note (Signed)
[facility] CM Inpatient Consult ? ? ?11/27/2021 ? ?Penny [name] ?Aug 29, 1934 ?[id number] ? ?[facility] Management [facility] CM) ?  ?Patient chart has been reviewed with noted high risk score for unplanned readmissions.  Patient assessed for community [name] follow up needs. Per review, current disposition is for SNF. No [facility] CM needs. ?  ?Of note, [facility] Care Management services does not replace or interfere with any services that are arranged by inpatient case management or social work.  ?  ?Netta Cedars, MSN, RN ?[facility] Liaison ?Phone [phone] ?Toll free office [phone]   ? ?

## 2021-11-27 NOTE — Progress Notes (Signed)
Report called to [facility]. IV removed. Tele removed. Pt dressed in paper gown. Awaiting [name] for transport. ?

## 2021-11-27 NOTE — Discharge Summary (Signed)
?Physician Discharge Summary  ? ?Penny [name] TFT:[id number] DOB: 1934-12-02 DOA: 11/24/2021 ? ?PCP: Penny Brothers, MD ? ?Admit date: 11/24/2021 ?Discharge date:  11/27/2021 ? ?Admitted From: SNF ?Disposition:  SNF ?Discharging physician: Penny Dee, MD ? ?Recommendations for Outpatient Follow-up:  ?Decreased metformin to 500 mg daily due to CKD; if renal fxn worsens in future, might need do discontinue altogether  ?Patient requesting motorized WC ? ? ?Discharge Condition: stable ?CODE STATUS: DNR ?Diet recommendation:  ?Diet Orders (From admission, onward)  ? ?  Start     Ordered  ? 11/27/21 0000  Diet Carb Modified       ? 11/27/21 1023  ? 11/24/21 1452  Diet Carb Modified Fluid consistency: Thin; Room service appropriate? Yes  Diet effective now       ?Question Answer Comment  ?Diet-HS Snack? Nothing   ?Calorie Level Medium 1600-2000   ?Fluid consistency: Thin   ?Room service appropriate? Yes   ?  ? 11/24/21 1451  ? ?  ?  ? ?  ? ? ?Hospital Course: ?Penny [name] is an 87 yo female with PMH CKD3, DMII, dementia who presented with fever and altered mentation.  She was referred to the hospital from her SNF due to fever and altered mentation.  She was admitted for further work-up regarding possible infection. ?See below for further A&P.  ? ?Assessment and Plan: ?* Severe sepsis (HCC)-resolved as of 11/27/2021 ?- Presented with fever (104.5), tachypnea, encephalopathy, and hypotension.  ?-Urine culture has speciated with group B strep, this is her presumed source at this time.  Severely elevated procalcitonin has down trended since admission in reassurance.  Initially started on vancomycin, cefepime, Flagyl which was transitioned to meropenem due to hx ESBL infection.  Overall clinical response has been good.  Given culture results with GBS, she was de-escalated to amoxicillin on 5/14 and will complete a 7 day course total  ?-Mentation has improved.  Family bedside also in agreement ?-Blood cultures negative to date ? ?Acute  metabolic encephalopathy-resolved as of 11/27/2021 ?- Unclear how much of this truly is acute versus her underlying dementia ?-Mentation back to baseline  ? ?Acute renal failure superimposed on stage 3a chronic kidney disease ([place]) ?- patient has history of CKD3a. Baseline creat ~ 1, eGFR 52-59 ?- patient presents with increase in creat >0.3 mg/dL above baseline, creat increase >1.5x baseline presumed to have occurred within past 7 days PTA ?-Possibly prerenal given BUN creatinine ratio ?-Creatinine 1.7 on admission and has improved with fluids; okay to d/c fluids ?- creat 1.11 on day of discharge  ? ?HTN (hypertension) ?- Low/normal since admission ? ?Anemia due to chronic kidney disease ?- Baseline hemoglobin 8 to 9 g/dL ?- Currently at baseline and no overt bleeding ? ?Senile dementia without behavioral disturbance ([place]) ?Monitor mentation while on antibiotics ?- Continue nightly melatonin ? ?Type 2 diabetes mellitus with vascular disease ([place]) ?- Continue SSI and CBG monitoring ?-A1c 6.4% on admission ?-On metformin at home but CKD may preclude continuing this at discharge ?- continue diet control; decreased metformin to 500 mg daily; if any further deterioration of renal function in the future, would consider discontinuation ? ? ? ?Principal Diagnosis: Severe sepsis ([place]) ? ?Discharge Diagnoses: ?Active Problems: ?  Acute renal failure superimposed on stage 3a chronic kidney disease ([place]) ?  Type 2 diabetes mellitus with vascular disease ([place]) ?  Senile dementia without behavioral disturbance ([place]) ?  Anemia due to chronic kidney disease ?  HTN (hypertension) ? ? ?Discharge Instructions   ? ?  Diet Carb Modified   Complete by: As directed ?  ? Increase activity slowly   Complete by: As directed ?  ? No wound care   Complete by: As directed ?  ? ?  ? ?Allergies as of 11/27/2021   ? ?   Reactions  ? Glipizide Rash  ? ?  ? ?  ?Medication List  ?  ? ?TAKE these medications   ? ?acetaminophen 325 MG tablet ?Commonly  known as: TYLENOL ?Take 650 mg by mouth every 6 (six) hours as needed for mild pain. ?What changed: Another medication with the same name was removed. Continue taking this medication, and follow the directions you see here. ?  ?amoxicillin 500 MG capsule ?Commonly known as: AMOXIL ?Take 1 capsule (500 mg total) by mouth 3 (three) times daily for 5 days. ?  ?aspirin 81 MG tablet ?Take 81 mg by mouth daily. ?  ?carvedilol 12.5 MG tablet ?Commonly known as: COREG ?Take 12.5 mg by mouth 2 (two) times daily. ?  ?feeding supplement (GLUCERNA SHAKE) Liqd ?Take 237 mLs by mouth 2 (two) times daily between meals. ?  ?hydrALAZINE 25 MG tablet ?Commonly known as: APRESOLINE ?Take 1 tablet (25 mg total) by mouth every 8 (eight) hours. ?  ?hydrOXYzine 25 MG tablet ?Commonly known as: ATARAX ?Take 25 mg by mouth every 6 (six) hours as needed for itching. ?  ?magnesium hydroxide 400 MG/5ML suspension ?Commonly known as: MILK OF MAGNESIA ?Take 30 mLs by mouth every 12 (twelve) hours as needed for mild constipation. ?  ?melatonin 3 MG Tabs tablet ?Take 3 mg by mouth at bedtime. ?  ?metFORMIN 500 MG tablet ?Commonly known as: GLUCOPHAGE ?Take 1 tablet (500 mg total) by mouth daily with breakfast. ?What changed: when to take this ?  ?Multivitamin Adults Tabs ?Take 1 tablet by mouth daily. ?  ?polyethylene glycol 17 g packet ?Commonly known as: MIRALAX / GLYCOLAX ?Take 17 g by mouth daily. ?  ?sennosides-docusate sodium 8.6-50 MG tablet ?Commonly known as: SENOKOT-S ?Take 2 tablets by mouth at bedtime. ?  ? ?  ? ? ?Allergies  ?Allergen Reactions  ? Glipizide Rash  ? ? ?Consultations: ? ? ?Procedures: ? ? ?Discharge Exam: ?BP 133/75 (BP Location: Left Arm)   Pulse 67   Temp 98.7 ?F (37.1 ?C) (Oral)   Resp 18   Ht '5\' 7"'  (1.702 m)   Wt 63 kg   LMP  (LMP Unknown)   SpO2 94%   BMI 21.77 kg/m?  ?Physical Exam ?Constitutional:   ?   Comments: Chronically ill elderly woman lying in bed in no distress  ?HENT:  ?   Head: Normocephalic  and atraumatic.  ?   Mouth/Throat:  ?   Mouth: Mucous membranes are moist.  ?Eyes:  ?   Extraocular Movements: Extraocular movements intact.  ?Cardiovascular:  ?   Rate and Rhythm: Normal rate and regular rhythm.  ?Pulmonary:  ?   Effort: No respiratory distress.  ?   Breath sounds: Normal breath sounds. No wheezing.  ?Abdominal:  ?   General: Bowel sounds are normal. There is no distension.  ?   Palpations: Abdomen is soft.  ?   Tenderness: There is no abdominal tenderness.  ?Musculoskeletal:     ?   General: Swelling present.  ?   Cervical back: Normal range of motion and neck supple.  ?   Comments: 1-2+ bilateral lower extremity pitting edema with tenderness with light palpation.  Very mild erythema noted with no demarcations.  ?  Skin: ?   General: Skin is warm.  ?Neurological:  ?   General: No focal deficit present.  ?   Mental Status: Mental status is at baseline.  ?Psychiatric:     ?   Mood and Affect: Mood normal.  ?  ? ?The results of significant diagnostics from this hospitalization (including imaging, microbiology, ancillary and laboratory) are listed below for reference.   ?Microbiology: ?Recent Results (from the past 240 hour(s))  ?Culture, blood (Routine x 2)     Status: None (Preliminary result)  ? Collection Time: 11/24/21 10:28 AM  ? Specimen: BLOOD  ?Result Value Ref Range Status  ? Specimen Description   Final  ?  BLOOD RIGHT ANTECUBITAL ?Performed at [facility], [place] [address], [place], [place] [address] ?  ? Special Requests   Final  ?  BOTTLES DRAWN AEROBIC AND ANAEROBIC Blood Culture adequate volume ?Performed at [facility], [place] [address], [place], [place] [address] ?  ? Culture   Final  ?  NO GROWTH 3 DAYS ?Performed at [facility], [place] [address] [place], [place] [address] ?  ? Report Status PENDING  Incomplete  ?Culture, blood (Routine x 2)     Status: None (Preliminary result)  ? Collection Time: 11/24/21 10:28 AM  ? Specimen: BLOOD RIGHT  FOREARM  ?Result Value Ref Range Status  ? Specimen Description   Final  ?  BLOOD RIGHT FOREARM ?Performed at [facility], [place] [address], [place], [place] [address] ?  ? Special Requests   Final  ?  BOTTLES

## 2021-11-27 NOTE — TOC Transition Note (Signed)
Transition of Care (TOC) - CM/SW Discharge Note ? ? ?Patient Details  ?Name: Penny [name] ?MRN: [id number] ?Date of Birth: 31-Jul-1934 ? ?Transition of Care [facility]) CM/SW Contact:  ?Leeroy Cha, RN ?Phone Number: ?11/27/2021, 10:53 AM ? ? ?Clinical Narrative:    ?Patient dcd to return to [facility].  Transport package completed and placed at nurses station.  Floor rn is aware.  [name] was called at 1045 for transport. ? ? ?Final next level of care: [place] ?Barriers to Discharge: Continued Medical Work up ? ? ?Patient Goals and CMS Choice ?Patient states their goals for this hospitalization and ongoing recovery are:: To return back to [facility]. ?CMS Medicare.gov Compare Post Acute Care list provided to:: Patient ?Choice offered to / list presented to : Patient ? ?Discharge Placement ?  ?           ?  ?  ?  ?  ? ?Discharge Plan and Services ?  ?  ?           ?  ?  ?  ?  ?  ?  ?  ?  ?  ?  ? ?Social Determinants of Health (SDOH) Interventions ?  ? ? ?Readmission Risk Interventions ?   ? View : No data to display.  ?  ?  ?  ? ? ? ? ? ?

## 2021-11-27 NOTE — TOC Progression Note (Signed)
Transition of Care (TOC) - Progression Note  ? ? ?Patient Details  ?Name: Penny [name] ?MRN: [id number] ?Date of Birth: Apr 24, 1935 ? ?Transition of Care (TOC) CM/SW Contact  ?Leeroy Cha, RN ?Phone Number: ?11/27/2021, 10:37 AM ? ?Clinical Narrative:    ?Dc summary aent via the hub, med.neccsity sheet completed. Text to Juliann Pulse at [facility] that patient is ready to return. ? ? ?Expected Discharge Plan: [place] ?Barriers to Discharge: Continued Medical Work up ? ?Expected Discharge Plan and Services ?Expected Discharge Plan: [place] ?  ?  ?  ?Living arrangements for the past 2 months: [employer] ?Expected Discharge Date: 11/27/21               ?  ?  ?  ?  ?  ?  ?  ?  ?  ?  ? ? ?Social Determinants of Health (SDOH) Interventions ?  ? ?Readmission Risk Interventions ?   ? View : No data to display.  ?  ?  ?  ? ? ?

## 2021-11-28 DIAGNOSIS — N390 Urinary tract infection, site not specified: Secondary | ICD-10-CM | POA: Diagnosis not present

## 2021-11-28 DIAGNOSIS — I129 Hypertensive chronic kidney disease with stage 1 through stage 4 chronic kidney disease, or unspecified chronic kidney disease: Secondary | ICD-10-CM | POA: Diagnosis not present

## 2021-11-28 DIAGNOSIS — N1831 Chronic kidney disease, stage 3a: Secondary | ICD-10-CM | POA: Diagnosis not present

## 2021-11-28 DIAGNOSIS — I739 Peripheral vascular disease, unspecified: Secondary | ICD-10-CM | POA: Diagnosis not present

## 2021-11-28 DIAGNOSIS — M79661 Pain in right lower leg: Secondary | ICD-10-CM | POA: Diagnosis not present

## 2021-11-28 DIAGNOSIS — R600 Localized edema: Secondary | ICD-10-CM | POA: Diagnosis not present

## 2021-11-28 DIAGNOSIS — I10 Essential (primary) hypertension: Secondary | ICD-10-CM | POA: Diagnosis not present

## 2021-11-29 DIAGNOSIS — E1159 Type 2 diabetes mellitus with other circulatory complications: Secondary | ICD-10-CM | POA: Diagnosis not present

## 2021-11-29 DIAGNOSIS — M79661 Pain in right lower leg: Secondary | ICD-10-CM | POA: Diagnosis not present

## 2021-11-29 DIAGNOSIS — I10 Essential (primary) hypertension: Secondary | ICD-10-CM | POA: Diagnosis not present

## 2021-11-29 DIAGNOSIS — R600 Localized edema: Secondary | ICD-10-CM | POA: Diagnosis not present

## 2021-11-29 DIAGNOSIS — R52 Pain, unspecified: Secondary | ICD-10-CM | POA: Diagnosis not present

## 2021-11-29 DIAGNOSIS — E079 Disorder of thyroid, unspecified: Secondary | ICD-10-CM | POA: Diagnosis not present

## 2021-11-29 DIAGNOSIS — M5134 Other intervertebral disc degeneration, thoracic region: Secondary | ICD-10-CM | POA: Diagnosis not present

## 2021-11-29 LAB — CULTURE, BLOOD (ROUTINE X 2)
Culture: NO GROWTH
Culture: NO GROWTH
Special Requests: ADEQUATE
Special Requests: ADEQUATE

## 2021-11-30 DIAGNOSIS — I10 Essential (primary) hypertension: Secondary | ICD-10-CM | POA: Diagnosis not present

## 2021-11-30 DIAGNOSIS — N1831 Chronic kidney disease, stage 3a: Secondary | ICD-10-CM | POA: Diagnosis not present

## 2021-11-30 DIAGNOSIS — I129 Hypertensive chronic kidney disease with stage 1 through stage 4 chronic kidney disease, or unspecified chronic kidney disease: Secondary | ICD-10-CM | POA: Diagnosis not present

## 2021-11-30 DIAGNOSIS — M79661 Pain in right lower leg: Secondary | ICD-10-CM | POA: Diagnosis not present

## 2021-11-30 DIAGNOSIS — M79671 Pain in right foot: Secondary | ICD-10-CM | POA: Diagnosis not present

## 2021-11-30 DIAGNOSIS — M160 Bilateral primary osteoarthritis of hip: Secondary | ICD-10-CM | POA: Diagnosis not present

## 2021-11-30 DIAGNOSIS — M170 Bilateral primary osteoarthritis of knee: Secondary | ICD-10-CM | POA: Diagnosis not present

## 2021-11-30 DIAGNOSIS — I739 Peripheral vascular disease, unspecified: Secondary | ICD-10-CM | POA: Diagnosis not present

## 2021-11-30 DIAGNOSIS — G3184 Mild cognitive impairment, so stated: Secondary | ICD-10-CM | POA: Diagnosis not present

## 2021-11-30 DIAGNOSIS — Z8673 Personal history of transient ischemic attack (TIA), and cerebral infarction without residual deficits: Secondary | ICD-10-CM | POA: Diagnosis not present

## 2021-11-30 DIAGNOSIS — M25571 Pain in right ankle and joints of right foot: Secondary | ICD-10-CM | POA: Diagnosis not present

## 2021-11-30 DIAGNOSIS — E079 Disorder of thyroid, unspecified: Secondary | ICD-10-CM | POA: Diagnosis not present

## 2021-11-30 DIAGNOSIS — M6281 Muscle weakness (generalized): Secondary | ICD-10-CM | POA: Diagnosis not present

## 2021-11-30 DIAGNOSIS — M5134 Other intervertebral disc degeneration, thoracic region: Secondary | ICD-10-CM | POA: Diagnosis not present

## 2021-12-01 DIAGNOSIS — M79661 Pain in right lower leg: Secondary | ICD-10-CM | POA: Diagnosis not present

## 2021-12-01 DIAGNOSIS — R600 Localized edema: Secondary | ICD-10-CM | POA: Diagnosis not present

## 2021-12-04 DIAGNOSIS — L89626 Pressure-induced deep tissue damage of left heel: Secondary | ICD-10-CM | POA: Diagnosis not present

## 2021-12-04 DIAGNOSIS — L22 Diaper dermatitis: Secondary | ICD-10-CM | POA: Diagnosis not present

## 2021-12-04 DIAGNOSIS — R600 Localized edema: Secondary | ICD-10-CM | POA: Diagnosis not present

## 2021-12-04 DIAGNOSIS — M79661 Pain in right lower leg: Secondary | ICD-10-CM | POA: Diagnosis not present

## 2021-12-04 DIAGNOSIS — L89310 Pressure ulcer of right buttock, unstageable: Secondary | ICD-10-CM | POA: Diagnosis not present

## 2021-12-04 DIAGNOSIS — L89616 Pressure-induced deep tissue damage of right heel: Secondary | ICD-10-CM | POA: Diagnosis not present

## 2021-12-06 DIAGNOSIS — M79661 Pain in right lower leg: Secondary | ICD-10-CM | POA: Diagnosis not present

## 2021-12-06 DIAGNOSIS — R600 Localized edema: Secondary | ICD-10-CM | POA: Diagnosis not present

## 2021-12-06 DIAGNOSIS — L03115 Cellulitis of right lower limb: Secondary | ICD-10-CM | POA: Diagnosis not present

## 2021-12-12 DIAGNOSIS — L89626 Pressure-induced deep tissue damage of left heel: Secondary | ICD-10-CM | POA: Diagnosis not present

## 2021-12-12 DIAGNOSIS — L89313 Pressure ulcer of right buttock, stage 3: Secondary | ICD-10-CM | POA: Diagnosis not present

## 2021-12-12 DIAGNOSIS — L89616 Pressure-induced deep tissue damage of right heel: Secondary | ICD-10-CM | POA: Diagnosis not present

## 2021-12-12 DIAGNOSIS — E1159 Type 2 diabetes mellitus with other circulatory complications: Secondary | ICD-10-CM | POA: Diagnosis not present

## 2021-12-14 DIAGNOSIS — M5134 Other intervertebral disc degeneration, thoracic region: Secondary | ICD-10-CM | POA: Diagnosis not present

## 2021-12-14 DIAGNOSIS — K5900 Constipation, unspecified: Secondary | ICD-10-CM | POA: Diagnosis not present

## 2021-12-14 DIAGNOSIS — M6281 Muscle weakness (generalized): Secondary | ICD-10-CM | POA: Diagnosis not present

## 2021-12-14 DIAGNOSIS — R1110 Vomiting, unspecified: Secondary | ICD-10-CM | POA: Diagnosis not present

## 2021-12-14 DIAGNOSIS — Z1159 Encounter for screening for other viral diseases: Secondary | ICD-10-CM | POA: Diagnosis not present

## 2021-12-15 DIAGNOSIS — M6281 Muscle weakness (generalized): Secondary | ICD-10-CM | POA: Diagnosis not present

## 2021-12-15 DIAGNOSIS — M5134 Other intervertebral disc degeneration, thoracic region: Secondary | ICD-10-CM | POA: Diagnosis not present

## 2021-12-15 DIAGNOSIS — Z1159 Encounter for screening for other viral diseases: Secondary | ICD-10-CM | POA: Diagnosis not present

## 2021-12-18 DIAGNOSIS — Z1159 Encounter for screening for other viral diseases: Secondary | ICD-10-CM | POA: Diagnosis not present

## 2021-12-18 DIAGNOSIS — M6281 Muscle weakness (generalized): Secondary | ICD-10-CM | POA: Diagnosis not present

## 2021-12-18 DIAGNOSIS — M5134 Other intervertebral disc degeneration, thoracic region: Secondary | ICD-10-CM | POA: Diagnosis not present

## 2021-12-19 DIAGNOSIS — M5134 Other intervertebral disc degeneration, thoracic region: Secondary | ICD-10-CM | POA: Diagnosis not present

## 2021-12-19 DIAGNOSIS — Z1159 Encounter for screening for other viral diseases: Secondary | ICD-10-CM | POA: Diagnosis not present

## 2021-12-19 DIAGNOSIS — M6281 Muscle weakness (generalized): Secondary | ICD-10-CM | POA: Diagnosis not present

## 2021-12-20 ENCOUNTER — Other Ambulatory Visit: Payer: Self-pay | Admitting: *Deleted

## 2021-12-20 DIAGNOSIS — M6281 Muscle weakness (generalized): Secondary | ICD-10-CM | POA: Diagnosis not present

## 2021-12-20 DIAGNOSIS — M5134 Other intervertebral disc degeneration, thoracic region: Secondary | ICD-10-CM | POA: Diagnosis not present

## 2021-12-20 DIAGNOSIS — Z1159 Encounter for screening for other viral diseases: Secondary | ICD-10-CM | POA: Diagnosis not present

## 2021-12-20 NOTE — Patient Outreach (Signed)
Per [place] eligible member currently resides in [facility].  Screened for post care coordination needs.   Member's PCP at [facility] has [facility] care management.  Facility site visit to [facility] skilled nursing facility. Met with Marita Kansas, SNF SW who reports member is a [facility] term care resident.    Will sign off. No identifiable post SNF care coordination needs.    Marthenia Rolling, MSN, RN,BSN [facility] Acute Care Coordinator [phone] [facility]) [phone]  (Toll free office)

## 2021-12-21 DIAGNOSIS — L89313 Pressure ulcer of right buttock, stage 3: Secondary | ICD-10-CM | POA: Diagnosis not present

## 2021-12-21 DIAGNOSIS — L89626 Pressure-induced deep tissue damage of left heel: Secondary | ICD-10-CM | POA: Diagnosis not present

## 2021-12-21 DIAGNOSIS — L89616 Pressure-induced deep tissue damage of right heel: Secondary | ICD-10-CM | POA: Diagnosis not present

## 2021-12-21 DIAGNOSIS — M5134 Other intervertebral disc degeneration, thoracic region: Secondary | ICD-10-CM | POA: Diagnosis not present

## 2021-12-21 DIAGNOSIS — M6281 Muscle weakness (generalized): Secondary | ICD-10-CM | POA: Diagnosis not present

## 2021-12-21 DIAGNOSIS — Z1159 Encounter for screening for other viral diseases: Secondary | ICD-10-CM | POA: Diagnosis not present

## 2021-12-21 DIAGNOSIS — L89620 Pressure ulcer of left heel, unstageable: Secondary | ICD-10-CM | POA: Diagnosis not present

## 2021-12-22 DIAGNOSIS — L89150 Pressure ulcer of sacral region, unstageable: Secondary | ICD-10-CM | POA: Diagnosis not present

## 2021-12-22 DIAGNOSIS — L89313 Pressure ulcer of right buttock, stage 3: Secondary | ICD-10-CM | POA: Diagnosis not present

## 2021-12-23 DIAGNOSIS — Z1159 Encounter for screening for other viral diseases: Secondary | ICD-10-CM | POA: Diagnosis not present

## 2021-12-23 DIAGNOSIS — M6281 Muscle weakness (generalized): Secondary | ICD-10-CM | POA: Diagnosis not present

## 2021-12-23 DIAGNOSIS — M5134 Other intervertebral disc degeneration, thoracic region: Secondary | ICD-10-CM | POA: Diagnosis not present

## 2021-12-25 DIAGNOSIS — M5134 Other intervertebral disc degeneration, thoracic region: Secondary | ICD-10-CM | POA: Diagnosis not present

## 2021-12-25 DIAGNOSIS — M6281 Muscle weakness (generalized): Secondary | ICD-10-CM | POA: Diagnosis not present

## 2021-12-25 DIAGNOSIS — L89620 Pressure ulcer of left heel, unstageable: Secondary | ICD-10-CM | POA: Diagnosis not present

## 2021-12-25 DIAGNOSIS — L89626 Pressure-induced deep tissue damage of left heel: Secondary | ICD-10-CM | POA: Diagnosis not present

## 2021-12-25 DIAGNOSIS — L89313 Pressure ulcer of right buttock, stage 3: Secondary | ICD-10-CM | POA: Diagnosis not present

## 2021-12-25 DIAGNOSIS — L89616 Pressure-induced deep tissue damage of right heel: Secondary | ICD-10-CM | POA: Diagnosis not present

## 2021-12-25 DIAGNOSIS — Z1159 Encounter for screening for other viral diseases: Secondary | ICD-10-CM | POA: Diagnosis not present

## 2021-12-26 DIAGNOSIS — M5134 Other intervertebral disc degeneration, thoracic region: Secondary | ICD-10-CM | POA: Diagnosis not present

## 2021-12-26 DIAGNOSIS — M6281 Muscle weakness (generalized): Secondary | ICD-10-CM | POA: Diagnosis not present

## 2021-12-26 DIAGNOSIS — Z1159 Encounter for screening for other viral diseases: Secondary | ICD-10-CM | POA: Diagnosis not present

## 2021-12-26 DIAGNOSIS — I10 Essential (primary) hypertension: Secondary | ICD-10-CM | POA: Diagnosis not present

## 2021-12-27 DIAGNOSIS — I10 Essential (primary) hypertension: Secondary | ICD-10-CM | POA: Diagnosis not present

## 2021-12-27 DIAGNOSIS — M6281 Muscle weakness (generalized): Secondary | ICD-10-CM | POA: Diagnosis not present

## 2021-12-27 DIAGNOSIS — M5134 Other intervertebral disc degeneration, thoracic region: Secondary | ICD-10-CM | POA: Diagnosis not present

## 2021-12-27 DIAGNOSIS — R42 Dizziness and giddiness: Secondary | ICD-10-CM | POA: Diagnosis not present

## 2021-12-27 DIAGNOSIS — Z1159 Encounter for screening for other viral diseases: Secondary | ICD-10-CM | POA: Diagnosis not present

## 2021-12-27 DIAGNOSIS — R001 Bradycardia, unspecified: Secondary | ICD-10-CM | POA: Diagnosis not present

## 2021-12-28 DIAGNOSIS — G629 Polyneuropathy, unspecified: Secondary | ICD-10-CM | POA: Diagnosis not present

## 2021-12-28 DIAGNOSIS — R001 Bradycardia, unspecified: Secondary | ICD-10-CM | POA: Diagnosis not present

## 2021-12-28 DIAGNOSIS — M79672 Pain in left foot: Secondary | ICD-10-CM | POA: Diagnosis not present

## 2021-12-28 DIAGNOSIS — M79671 Pain in right foot: Secondary | ICD-10-CM | POA: Diagnosis not present

## 2021-12-28 DIAGNOSIS — M79662 Pain in left lower leg: Secondary | ICD-10-CM | POA: Diagnosis not present

## 2021-12-28 DIAGNOSIS — Z1159 Encounter for screening for other viral diseases: Secondary | ICD-10-CM | POA: Diagnosis not present

## 2021-12-28 DIAGNOSIS — M6281 Muscle weakness (generalized): Secondary | ICD-10-CM | POA: Diagnosis not present

## 2021-12-28 DIAGNOSIS — M5134 Other intervertebral disc degeneration, thoracic region: Secondary | ICD-10-CM | POA: Diagnosis not present

## 2021-12-30 DIAGNOSIS — M5134 Other intervertebral disc degeneration, thoracic region: Secondary | ICD-10-CM | POA: Diagnosis not present

## 2021-12-30 DIAGNOSIS — Z1159 Encounter for screening for other viral diseases: Secondary | ICD-10-CM | POA: Diagnosis not present

## 2021-12-30 DIAGNOSIS — M6281 Muscle weakness (generalized): Secondary | ICD-10-CM | POA: Diagnosis not present

## 2021-12-31 DIAGNOSIS — M5134 Other intervertebral disc degeneration, thoracic region: Secondary | ICD-10-CM | POA: Diagnosis not present

## 2021-12-31 DIAGNOSIS — M6281 Muscle weakness (generalized): Secondary | ICD-10-CM | POA: Diagnosis not present

## 2021-12-31 DIAGNOSIS — Z1159 Encounter for screening for other viral diseases: Secondary | ICD-10-CM | POA: Diagnosis not present

## 2022-01-02 DIAGNOSIS — Z1159 Encounter for screening for other viral diseases: Secondary | ICD-10-CM | POA: Diagnosis not present

## 2022-01-02 DIAGNOSIS — M6281 Muscle weakness (generalized): Secondary | ICD-10-CM | POA: Diagnosis not present

## 2022-01-02 DIAGNOSIS — M5134 Other intervertebral disc degeneration, thoracic region: Secondary | ICD-10-CM | POA: Diagnosis not present

## 2022-01-03 DIAGNOSIS — L299 Pruritus, unspecified: Secondary | ICD-10-CM | POA: Diagnosis not present

## 2022-01-03 DIAGNOSIS — L209 Atopic dermatitis, unspecified: Secondary | ICD-10-CM | POA: Diagnosis not present

## 2022-01-03 DIAGNOSIS — J069 Acute upper respiratory infection, unspecified: Secondary | ICD-10-CM | POA: Diagnosis not present

## 2022-01-04 DIAGNOSIS — L89616 Pressure-induced deep tissue damage of right heel: Secondary | ICD-10-CM | POA: Diagnosis not present

## 2022-01-04 DIAGNOSIS — M5134 Other intervertebral disc degeneration, thoracic region: Secondary | ICD-10-CM | POA: Diagnosis not present

## 2022-01-04 DIAGNOSIS — M6281 Muscle weakness (generalized): Secondary | ICD-10-CM | POA: Diagnosis not present

## 2022-01-04 DIAGNOSIS — Z1159 Encounter for screening for other viral diseases: Secondary | ICD-10-CM | POA: Diagnosis not present

## 2022-01-04 DIAGNOSIS — L89313 Pressure ulcer of right buttock, stage 3: Secondary | ICD-10-CM | POA: Diagnosis not present

## 2022-01-04 DIAGNOSIS — L89620 Pressure ulcer of left heel, unstageable: Secondary | ICD-10-CM | POA: Diagnosis not present

## 2022-01-04 DIAGNOSIS — L89626 Pressure-induced deep tissue damage of left heel: Secondary | ICD-10-CM | POA: Diagnosis not present

## 2022-01-05 DIAGNOSIS — Z1159 Encounter for screening for other viral diseases: Secondary | ICD-10-CM | POA: Diagnosis not present

## 2022-01-05 DIAGNOSIS — M6281 Muscle weakness (generalized): Secondary | ICD-10-CM | POA: Diagnosis not present

## 2022-01-05 DIAGNOSIS — M5134 Other intervertebral disc degeneration, thoracic region: Secondary | ICD-10-CM | POA: Diagnosis not present

## 2022-01-08 DIAGNOSIS — D638 Anemia in other chronic diseases classified elsewhere: Secondary | ICD-10-CM | POA: Diagnosis not present

## 2022-01-08 DIAGNOSIS — L89613 Pressure ulcer of right heel, stage 3: Secondary | ICD-10-CM | POA: Diagnosis not present

## 2022-01-08 DIAGNOSIS — E1159 Type 2 diabetes mellitus with other circulatory complications: Secondary | ICD-10-CM | POA: Diagnosis not present

## 2022-01-08 DIAGNOSIS — L89150 Pressure ulcer of sacral region, unstageable: Secondary | ICD-10-CM | POA: Diagnosis not present

## 2022-01-08 DIAGNOSIS — L89623 Pressure ulcer of left heel, stage 3: Secondary | ICD-10-CM | POA: Diagnosis not present

## 2022-01-09 DIAGNOSIS — L299 Pruritus, unspecified: Secondary | ICD-10-CM | POA: Diagnosis not present

## 2022-01-09 DIAGNOSIS — R001 Bradycardia, unspecified: Secondary | ICD-10-CM | POA: Diagnosis not present

## 2022-01-09 DIAGNOSIS — E119 Type 2 diabetes mellitus without complications: Secondary | ICD-10-CM | POA: Diagnosis not present

## 2022-01-09 DIAGNOSIS — M79671 Pain in right foot: Secondary | ICD-10-CM | POA: Diagnosis not present

## 2022-01-09 DIAGNOSIS — L209 Atopic dermatitis, unspecified: Secondary | ICD-10-CM | POA: Diagnosis not present

## 2022-01-09 DIAGNOSIS — G629 Polyneuropathy, unspecified: Secondary | ICD-10-CM | POA: Diagnosis not present

## 2022-01-09 DIAGNOSIS — K5900 Constipation, unspecified: Secondary | ICD-10-CM | POA: Diagnosis not present

## 2022-01-15 DIAGNOSIS — L299 Pruritus, unspecified: Secondary | ICD-10-CM | POA: Diagnosis not present

## 2022-01-15 DIAGNOSIS — R001 Bradycardia, unspecified: Secondary | ICD-10-CM | POA: Diagnosis not present

## 2022-01-15 DIAGNOSIS — L209 Atopic dermatitis, unspecified: Secondary | ICD-10-CM | POA: Diagnosis not present

## 2022-01-15 DIAGNOSIS — E1159 Type 2 diabetes mellitus with other circulatory complications: Secondary | ICD-10-CM | POA: Diagnosis not present

## 2022-01-18 DIAGNOSIS — L299 Pruritus, unspecified: Secondary | ICD-10-CM | POA: Diagnosis not present

## 2022-01-18 DIAGNOSIS — D638 Anemia in other chronic diseases classified elsewhere: Secondary | ICD-10-CM | POA: Diagnosis not present

## 2022-01-18 DIAGNOSIS — L89623 Pressure ulcer of left heel, stage 3: Secondary | ICD-10-CM | POA: Diagnosis not present

## 2022-01-18 DIAGNOSIS — L209 Atopic dermatitis, unspecified: Secondary | ICD-10-CM | POA: Diagnosis not present

## 2022-01-18 DIAGNOSIS — L89613 Pressure ulcer of right heel, stage 3: Secondary | ICD-10-CM | POA: Diagnosis not present

## 2022-01-18 DIAGNOSIS — K5900 Constipation, unspecified: Secondary | ICD-10-CM | POA: Diagnosis not present

## 2022-01-18 DIAGNOSIS — E1159 Type 2 diabetes mellitus with other circulatory complications: Secondary | ICD-10-CM | POA: Diagnosis not present

## 2022-01-22 DIAGNOSIS — L299 Pruritus, unspecified: Secondary | ICD-10-CM | POA: Diagnosis not present

## 2022-01-22 DIAGNOSIS — L209 Atopic dermatitis, unspecified: Secondary | ICD-10-CM | POA: Diagnosis not present

## 2022-01-23 DIAGNOSIS — E1159 Type 2 diabetes mellitus with other circulatory complications: Secondary | ICD-10-CM | POA: Diagnosis not present

## 2022-01-23 DIAGNOSIS — L89623 Pressure ulcer of left heel, stage 3: Secondary | ICD-10-CM | POA: Diagnosis not present

## 2022-01-23 DIAGNOSIS — G3184 Mild cognitive impairment, so stated: Secondary | ICD-10-CM | POA: Diagnosis not present

## 2022-01-23 DIAGNOSIS — D638 Anemia in other chronic diseases classified elsewhere: Secondary | ICD-10-CM | POA: Diagnosis not present

## 2022-01-29 DIAGNOSIS — R001 Bradycardia, unspecified: Secondary | ICD-10-CM | POA: Diagnosis not present

## 2022-01-29 DIAGNOSIS — I10 Essential (primary) hypertension: Secondary | ICD-10-CM | POA: Diagnosis not present

## 2022-01-29 DIAGNOSIS — M160 Bilateral primary osteoarthritis of hip: Secondary | ICD-10-CM | POA: Diagnosis not present

## 2022-01-29 DIAGNOSIS — I129 Hypertensive chronic kidney disease with stage 1 through stage 4 chronic kidney disease, or unspecified chronic kidney disease: Secondary | ICD-10-CM | POA: Diagnosis not present

## 2022-01-29 DIAGNOSIS — E119 Type 2 diabetes mellitus without complications: Secondary | ICD-10-CM | POA: Diagnosis not present

## 2022-01-29 DIAGNOSIS — G629 Polyneuropathy, unspecified: Secondary | ICD-10-CM | POA: Diagnosis not present

## 2022-01-30 DIAGNOSIS — L89623 Pressure ulcer of left heel, stage 3: Secondary | ICD-10-CM | POA: Diagnosis not present

## 2022-01-30 DIAGNOSIS — Z8673 Personal history of transient ischemic attack (TIA), and cerebral infarction without residual deficits: Secondary | ICD-10-CM | POA: Diagnosis not present

## 2022-01-30 DIAGNOSIS — G3184 Mild cognitive impairment, so stated: Secondary | ICD-10-CM | POA: Diagnosis not present

## 2022-01-30 DIAGNOSIS — M6281 Muscle weakness (generalized): Secondary | ICD-10-CM | POA: Diagnosis not present

## 2022-01-30 DIAGNOSIS — D638 Anemia in other chronic diseases classified elsewhere: Secondary | ICD-10-CM | POA: Diagnosis not present

## 2022-01-30 DIAGNOSIS — E1159 Type 2 diabetes mellitus with other circulatory complications: Secondary | ICD-10-CM | POA: Diagnosis not present

## 2022-01-31 DIAGNOSIS — Z8673 Personal history of transient ischemic attack (TIA), and cerebral infarction without residual deficits: Secondary | ICD-10-CM | POA: Diagnosis not present

## 2022-01-31 DIAGNOSIS — M6281 Muscle weakness (generalized): Secondary | ICD-10-CM | POA: Diagnosis not present

## 2022-02-01 DIAGNOSIS — M6281 Muscle weakness (generalized): Secondary | ICD-10-CM | POA: Diagnosis not present

## 2022-02-01 DIAGNOSIS — Z8673 Personal history of transient ischemic attack (TIA), and cerebral infarction without residual deficits: Secondary | ICD-10-CM | POA: Diagnosis not present

## 2022-02-02 DIAGNOSIS — M6281 Muscle weakness (generalized): Secondary | ICD-10-CM | POA: Diagnosis not present

## 2022-02-02 DIAGNOSIS — Z8673 Personal history of transient ischemic attack (TIA), and cerebral infarction without residual deficits: Secondary | ICD-10-CM | POA: Diagnosis not present

## 2022-02-05 DIAGNOSIS — M6281 Muscle weakness (generalized): Secondary | ICD-10-CM | POA: Diagnosis not present

## 2022-02-05 DIAGNOSIS — Z8673 Personal history of transient ischemic attack (TIA), and cerebral infarction without residual deficits: Secondary | ICD-10-CM | POA: Diagnosis not present

## 2022-02-06 DIAGNOSIS — Z8673 Personal history of transient ischemic attack (TIA), and cerebral infarction without residual deficits: Secondary | ICD-10-CM | POA: Diagnosis not present

## 2022-02-06 DIAGNOSIS — M6281 Muscle weakness (generalized): Secondary | ICD-10-CM | POA: Diagnosis not present

## 2022-02-06 DIAGNOSIS — L89613 Pressure ulcer of right heel, stage 3: Secondary | ICD-10-CM | POA: Diagnosis not present

## 2022-02-06 DIAGNOSIS — L89623 Pressure ulcer of left heel, stage 3: Secondary | ICD-10-CM | POA: Diagnosis not present

## 2022-02-06 DIAGNOSIS — D638 Anemia in other chronic diseases classified elsewhere: Secondary | ICD-10-CM | POA: Diagnosis not present

## 2022-02-06 DIAGNOSIS — E1159 Type 2 diabetes mellitus with other circulatory complications: Secondary | ICD-10-CM | POA: Diagnosis not present

## 2022-02-07 DIAGNOSIS — M6281 Muscle weakness (generalized): Secondary | ICD-10-CM | POA: Diagnosis not present

## 2022-02-07 DIAGNOSIS — Z8673 Personal history of transient ischemic attack (TIA), and cerebral infarction without residual deficits: Secondary | ICD-10-CM | POA: Diagnosis not present

## 2022-02-08 DIAGNOSIS — M6281 Muscle weakness (generalized): Secondary | ICD-10-CM | POA: Diagnosis not present

## 2022-02-08 DIAGNOSIS — Z8673 Personal history of transient ischemic attack (TIA), and cerebral infarction without residual deficits: Secondary | ICD-10-CM | POA: Diagnosis not present

## 2022-02-09 DIAGNOSIS — M6281 Muscle weakness (generalized): Secondary | ICD-10-CM | POA: Diagnosis not present

## 2022-02-09 DIAGNOSIS — Z8673 Personal history of transient ischemic attack (TIA), and cerebral infarction without residual deficits: Secondary | ICD-10-CM | POA: Diagnosis not present

## 2022-02-12 DIAGNOSIS — M6281 Muscle weakness (generalized): Secondary | ICD-10-CM | POA: Diagnosis not present

## 2022-02-12 DIAGNOSIS — Z8673 Personal history of transient ischemic attack (TIA), and cerebral infarction without residual deficits: Secondary | ICD-10-CM | POA: Diagnosis not present

## 2022-02-15 DIAGNOSIS — D638 Anemia in other chronic diseases classified elsewhere: Secondary | ICD-10-CM | POA: Diagnosis not present

## 2022-02-15 DIAGNOSIS — E1159 Type 2 diabetes mellitus with other circulatory complications: Secondary | ICD-10-CM | POA: Diagnosis not present

## 2022-02-15 DIAGNOSIS — L89623 Pressure ulcer of left heel, stage 3: Secondary | ICD-10-CM | POA: Diagnosis not present

## 2022-02-15 DIAGNOSIS — L89613 Pressure ulcer of right heel, stage 3: Secondary | ICD-10-CM | POA: Diagnosis not present

## 2022-02-20 DIAGNOSIS — L89623 Pressure ulcer of left heel, stage 3: Secondary | ICD-10-CM | POA: Diagnosis not present

## 2022-02-20 DIAGNOSIS — D638 Anemia in other chronic diseases classified elsewhere: Secondary | ICD-10-CM | POA: Diagnosis not present

## 2022-02-20 DIAGNOSIS — G3184 Mild cognitive impairment, so stated: Secondary | ICD-10-CM | POA: Diagnosis not present

## 2022-02-20 DIAGNOSIS — E1159 Type 2 diabetes mellitus with other circulatory complications: Secondary | ICD-10-CM | POA: Diagnosis not present

## 2022-02-23 DIAGNOSIS — L89623 Pressure ulcer of left heel, stage 3: Secondary | ICD-10-CM | POA: Diagnosis not present

## 2022-02-23 DIAGNOSIS — L89150 Pressure ulcer of sacral region, unstageable: Secondary | ICD-10-CM | POA: Diagnosis not present

## 2022-02-27 DIAGNOSIS — D638 Anemia in other chronic diseases classified elsewhere: Secondary | ICD-10-CM | POA: Diagnosis not present

## 2022-02-27 DIAGNOSIS — E1159 Type 2 diabetes mellitus with other circulatory complications: Secondary | ICD-10-CM | POA: Diagnosis not present

## 2022-02-27 DIAGNOSIS — L89623 Pressure ulcer of left heel, stage 3: Secondary | ICD-10-CM | POA: Diagnosis not present

## 2022-02-27 DIAGNOSIS — G3184 Mild cognitive impairment, so stated: Secondary | ICD-10-CM | POA: Diagnosis not present

## 2022-03-02 DIAGNOSIS — L299 Pruritus, unspecified: Secondary | ICD-10-CM | POA: Diagnosis not present

## 2022-03-02 DIAGNOSIS — L209 Atopic dermatitis, unspecified: Secondary | ICD-10-CM | POA: Diagnosis not present

## 2022-03-23 DIAGNOSIS — E1151 Type 2 diabetes mellitus with diabetic peripheral angiopathy without gangrene: Secondary | ICD-10-CM | POA: Diagnosis not present

## 2022-03-23 DIAGNOSIS — L603 Nail dystrophy: Secondary | ICD-10-CM | POA: Diagnosis not present

## 2022-03-23 DIAGNOSIS — Z7984 Long term (current) use of oral hypoglycemic drugs: Secondary | ICD-10-CM | POA: Diagnosis not present

## 2022-03-23 DIAGNOSIS — B351 Tinea unguium: Secondary | ICD-10-CM | POA: Diagnosis not present

## 2022-04-05 DIAGNOSIS — M5134 Other intervertebral disc degeneration, thoracic region: Secondary | ICD-10-CM | POA: Diagnosis not present

## 2022-04-05 DIAGNOSIS — E1159 Type 2 diabetes mellitus with other circulatory complications: Secondary | ICD-10-CM | POA: Diagnosis not present

## 2022-04-05 DIAGNOSIS — M6281 Muscle weakness (generalized): Secondary | ICD-10-CM | POA: Diagnosis not present

## 2022-04-05 DIAGNOSIS — I129 Hypertensive chronic kidney disease with stage 1 through stage 4 chronic kidney disease, or unspecified chronic kidney disease: Secondary | ICD-10-CM | POA: Diagnosis not present

## 2022-04-05 DIAGNOSIS — Z8673 Personal history of transient ischemic attack (TIA), and cerebral infarction without residual deficits: Secondary | ICD-10-CM | POA: Diagnosis not present

## 2022-04-05 DIAGNOSIS — G3184 Mild cognitive impairment, so stated: Secondary | ICD-10-CM | POA: Diagnosis not present

## 2022-04-05 DIAGNOSIS — I10 Essential (primary) hypertension: Secondary | ICD-10-CM | POA: Diagnosis not present

## 2022-04-06 DIAGNOSIS — E119 Type 2 diabetes mellitus without complications: Secondary | ICD-10-CM | POA: Diagnosis not present

## 2022-04-08 DIAGNOSIS — K429 Umbilical hernia without obstruction or gangrene: Secondary | ICD-10-CM | POA: Diagnosis not present

## 2022-04-08 DIAGNOSIS — K5900 Constipation, unspecified: Secondary | ICD-10-CM | POA: Diagnosis not present

## 2022-04-08 DIAGNOSIS — S2242XA Multiple fractures of ribs, left side, initial encounter for closed fracture: Secondary | ICD-10-CM | POA: Diagnosis not present

## 2022-04-08 DIAGNOSIS — R6520 Severe sepsis without septic shock: Secondary | ICD-10-CM | POA: Diagnosis not present

## 2022-04-08 DIAGNOSIS — Z7982 Long term (current) use of aspirin: Secondary | ICD-10-CM

## 2022-04-08 DIAGNOSIS — E1122 Type 2 diabetes mellitus with diabetic chronic kidney disease: Secondary | ICD-10-CM | POA: Diagnosis present

## 2022-04-08 DIAGNOSIS — G9341 Metabolic encephalopathy: Secondary | ICD-10-CM | POA: Diagnosis not present

## 2022-04-08 DIAGNOSIS — R531 Weakness: Secondary | ICD-10-CM | POA: Diagnosis not present

## 2022-04-08 DIAGNOSIS — Z8673 Personal history of transient ischemic attack (TIA), and cerebral infarction without residual deficits: Secondary | ICD-10-CM

## 2022-04-08 DIAGNOSIS — R6889 Other general symptoms and signs: Secondary | ICD-10-CM | POA: Diagnosis not present

## 2022-04-08 DIAGNOSIS — J9601 Acute respiratory failure with hypoxia: Secondary | ICD-10-CM | POA: Diagnosis not present

## 2022-04-08 DIAGNOSIS — Z87891 Personal history of nicotine dependence: Secondary | ICD-10-CM | POA: Diagnosis not present

## 2022-04-08 DIAGNOSIS — R7989 Other specified abnormal findings of blood chemistry: Secondary | ICD-10-CM | POA: Diagnosis not present

## 2022-04-08 DIAGNOSIS — Z79899 Other long term (current) drug therapy: Secondary | ICD-10-CM | POA: Diagnosis not present

## 2022-04-08 DIAGNOSIS — Z7401 Bed confinement status: Secondary | ICD-10-CM | POA: Diagnosis not present

## 2022-04-08 DIAGNOSIS — Z743 Need for continuous supervision: Secondary | ICD-10-CM | POA: Diagnosis not present

## 2022-04-08 DIAGNOSIS — Z66 Do not resuscitate: Secondary | ICD-10-CM | POA: Diagnosis not present

## 2022-04-08 DIAGNOSIS — R55 Syncope and collapse: Secondary | ICD-10-CM | POA: Diagnosis not present

## 2022-04-08 DIAGNOSIS — L209 Atopic dermatitis, unspecified: Secondary | ICD-10-CM | POA: Diagnosis not present

## 2022-04-08 DIAGNOSIS — I129 Hypertensive chronic kidney disease with stage 1 through stage 4 chronic kidney disease, or unspecified chronic kidney disease: Secondary | ICD-10-CM | POA: Diagnosis not present

## 2022-04-08 DIAGNOSIS — L03116 Cellulitis of left lower limb: Secondary | ICD-10-CM | POA: Diagnosis not present

## 2022-04-08 DIAGNOSIS — E871 Hypo-osmolality and hyponatremia: Secondary | ICD-10-CM | POA: Diagnosis present

## 2022-04-08 DIAGNOSIS — D631 Anemia in chronic kidney disease: Secondary | ICD-10-CM | POA: Diagnosis present

## 2022-04-08 DIAGNOSIS — N281 Cyst of kidney, acquired: Secondary | ICD-10-CM | POA: Diagnosis not present

## 2022-04-08 DIAGNOSIS — Z7984 Long term (current) use of oral hypoglycemic drugs: Secondary | ICD-10-CM

## 2022-04-08 DIAGNOSIS — R609 Edema, unspecified: Secondary | ICD-10-CM | POA: Diagnosis not present

## 2022-04-08 DIAGNOSIS — E861 Hypovolemia: Secondary | ICD-10-CM | POA: Diagnosis present

## 2022-04-08 DIAGNOSIS — Z20822 Contact with and (suspected) exposure to covid-19: Secondary | ICD-10-CM | POA: Diagnosis not present

## 2022-04-08 DIAGNOSIS — N390 Urinary tract infection, site not specified: Secondary | ICD-10-CM | POA: Diagnosis not present

## 2022-04-08 DIAGNOSIS — L03115 Cellulitis of right lower limb: Secondary | ICD-10-CM | POA: Diagnosis not present

## 2022-04-08 DIAGNOSIS — R509 Fever, unspecified: Secondary | ICD-10-CM | POA: Diagnosis not present

## 2022-04-08 DIAGNOSIS — R0902 Hypoxemia: Secondary | ICD-10-CM | POA: Diagnosis not present

## 2022-04-08 DIAGNOSIS — J986 Disorders of diaphragm: Secondary | ICD-10-CM | POA: Diagnosis not present

## 2022-04-08 DIAGNOSIS — R4182 Altered mental status, unspecified: Secondary | ICD-10-CM | POA: Diagnosis not present

## 2022-04-08 DIAGNOSIS — G9340 Encephalopathy, unspecified: Secondary | ICD-10-CM | POA: Diagnosis not present

## 2022-04-08 DIAGNOSIS — L538 Other specified erythematous conditions: Secondary | ICD-10-CM | POA: Diagnosis not present

## 2022-04-08 DIAGNOSIS — I6782 Cerebral ischemia: Secondary | ICD-10-CM | POA: Diagnosis not present

## 2022-04-08 DIAGNOSIS — J9691 Respiratory failure, unspecified with hypoxia: Secondary | ICD-10-CM | POA: Diagnosis not present

## 2022-04-08 DIAGNOSIS — A419 Sepsis, unspecified organism: Principal | ICD-10-CM | POA: Diagnosis present

## 2022-04-08 DIAGNOSIS — N1831 Chronic kidney disease, stage 3a: Secondary | ICD-10-CM | POA: Diagnosis not present

## 2022-04-08 DIAGNOSIS — I700 Atherosclerosis of aorta: Secondary | ICD-10-CM | POA: Diagnosis not present

## 2022-04-08 DIAGNOSIS — R739 Hyperglycemia, unspecified: Secondary | ICD-10-CM | POA: Diagnosis not present

## 2022-04-08 DIAGNOSIS — L0390 Cellulitis, unspecified: Secondary | ICD-10-CM | POA: Diagnosis not present

## 2022-04-09 ENCOUNTER — Emergency Department (HOSPITAL_COMMUNITY): Payer: Medicare Other

## 2022-04-09 ENCOUNTER — Encounter (HOSPITAL_COMMUNITY): Payer: Self-pay | Admitting: Emergency Medicine

## 2022-04-09 ENCOUNTER — Inpatient Hospital Stay (HOSPITAL_COMMUNITY)
Admission: EM | Admit: 2022-04-09 | Discharge: 2022-04-13 | DRG: 871 | Disposition: A | Discharge status: Skilled Nursing Facility | Payer: Medicare Other | Source: Skilled Nursing Facility | Attending: Internal Medicine | Admitting: Internal Medicine

## 2022-04-09 ENCOUNTER — Other Ambulatory Visit: Payer: Self-pay

## 2022-04-09 ENCOUNTER — Observation Stay (HOSPITAL_BASED_OUTPATIENT_CLINIC_OR_DEPARTMENT_OTHER): Payer: Medicare Other

## 2022-04-09 DIAGNOSIS — S2242XA Multiple fractures of ribs, left side, initial encounter for closed fracture: Secondary | ICD-10-CM | POA: Diagnosis not present

## 2022-04-09 DIAGNOSIS — G9341 Metabolic encephalopathy: Secondary | ICD-10-CM | POA: Diagnosis present

## 2022-04-09 DIAGNOSIS — A419 Sepsis, unspecified organism: Secondary | ICD-10-CM | POA: Diagnosis present

## 2022-04-09 DIAGNOSIS — R609 Edema, unspecified: Secondary | ICD-10-CM

## 2022-04-09 DIAGNOSIS — R55 Syncope and collapse: Secondary | ICD-10-CM | POA: Diagnosis not present

## 2022-04-09 DIAGNOSIS — I6782 Cerebral ischemia: Secondary | ICD-10-CM | POA: Diagnosis not present

## 2022-04-09 DIAGNOSIS — I700 Atherosclerosis of aorta: Secondary | ICD-10-CM | POA: Diagnosis not present

## 2022-04-09 DIAGNOSIS — R509 Fever, unspecified: Secondary | ICD-10-CM

## 2022-04-09 DIAGNOSIS — L538 Other specified erythematous conditions: Secondary | ICD-10-CM | POA: Diagnosis not present

## 2022-04-09 DIAGNOSIS — N390 Urinary tract infection, site not specified: Secondary | ICD-10-CM

## 2022-04-09 DIAGNOSIS — R4182 Altered mental status, unspecified: Secondary | ICD-10-CM

## 2022-04-09 DIAGNOSIS — L03115 Cellulitis of right lower limb: Secondary | ICD-10-CM

## 2022-04-09 DIAGNOSIS — J986 Disorders of diaphragm: Secondary | ICD-10-CM | POA: Diagnosis not present

## 2022-04-09 HISTORY — DX: Type 2 diabetes mellitus without complications: E11.9

## 2022-04-09 LAB — CBC WITH DIFFERENTIAL/PLATELET
Abs Immature Granulocytes: 0.11 10*3/uL — ABNORMAL HIGH (ref 0.00–0.07)
Basophils Absolute: 0 10*3/uL (ref 0.0–0.1)
Basophils Relative: 0 %
Eosinophils Absolute: 0 10*3/uL (ref 0.0–0.5)
Eosinophils Relative: 0 %
HCT: 32.1 % — ABNORMAL LOW (ref 36.0–46.0)
Hemoglobin: 10.1 g/dL — ABNORMAL LOW (ref 12.0–15.0)
Immature Granulocytes: 1 %
Lymphocytes Relative: 4 %
Lymphs Abs: 0.5 10*3/uL — ABNORMAL LOW (ref 0.7–4.0)
MCH: 30.9 pg (ref 26.0–34.0)
MCHC: 31.5 g/dL (ref 30.0–36.0)
MCV: 98.2 fL (ref 80.0–100.0)
Monocytes Absolute: 0.3 10*3/uL (ref 0.1–1.0)
Monocytes Relative: 3 %
Neutro Abs: 12.4 10*3/uL — ABNORMAL HIGH (ref 1.7–7.7)
Neutrophils Relative %: 92 %
Platelets: 165 10*3/uL (ref 150–400)
RBC: 3.27 MIL/uL — ABNORMAL LOW (ref 3.87–5.11)
RDW: 13.3 % (ref 11.5–15.5)
WBC: 13.5 10*3/uL — ABNORMAL HIGH (ref 4.0–10.5)
nRBC: 0 % (ref 0.0–0.2)

## 2022-04-09 LAB — COMPREHENSIVE METABOLIC PANEL
ALT: 30 U/L (ref 0–44)
AST: 30 U/L (ref 15–41)
Albumin: 2.4 g/dL — ABNORMAL LOW (ref 3.5–5.0)
Alkaline Phosphatase: 69 U/L (ref 38–126)
Anion gap: 9 (ref 5–15)
BUN: 24 mg/dL — ABNORMAL HIGH (ref 8–23)
CO2: 24 mmol/L (ref 22–32)
Calcium: 8.7 mg/dL — ABNORMAL LOW (ref 8.9–10.3)
Chloride: 100 mmol/L (ref 98–111)
Creatinine, Ser: 1.3 mg/dL — ABNORMAL HIGH (ref 0.44–1.00)
GFR, Estimated: 40 mL/min — ABNORMAL LOW (ref >60)
Glucose, Bld: 197 mg/dL — ABNORMAL HIGH (ref 70–99)
Potassium: 4 mmol/L (ref 3.5–5.1)
Sodium: 133 mmol/L — ABNORMAL LOW (ref 135–145)
Total Bilirubin: 1 mg/dL (ref 0.3–1.2)
Total Protein: 8 g/dL (ref 6.5–8.1)

## 2022-04-09 LAB — LACTIC ACID, PLASMA
Lactic Acid, Venous: 1.2 mmol/L (ref 0.5–1.9)
Lactic Acid, Venous: 1.1 mmol/L (ref 0.5–1.9)

## 2022-04-09 LAB — RESPIRATORY PANEL BY PCR

## 2022-04-09 LAB — CBG MONITORING, ED: Glucose-Capillary: 128 mg/dL — ABNORMAL HIGH (ref 70–99)

## 2022-04-09 LAB — PROTIME-INR
INR: 1.2 (ref 0.8–1.2)
Prothrombin Time: 14.9 seconds (ref 11.4–15.2)

## 2022-04-09 LAB — URINALYSIS, MICROSCOPIC (REFLEX)

## 2022-04-09 LAB — RESP PANEL BY RT-PCR (FLU A&B, COVID) ARPGX2
Influenza A by PCR: NEGATIVE
Influenza B by PCR: NEGATIVE
SARS Coronavirus 2 by RT PCR: NEGATIVE

## 2022-04-09 LAB — APTT: aPTT: 34 seconds (ref 24–36)

## 2022-04-09 LAB — URINALYSIS, ROUTINE W REFLEX MICROSCOPIC
Bilirubin Urine: NEGATIVE
Glucose, UA: NEGATIVE mg/dL
Ketones, ur: NEGATIVE mg/dL
Nitrite: NEGATIVE
Protein, ur: 100 mg/dL — AB
Specific Gravity, Urine: 1.025 (ref 1.005–1.030)
pH: 5.5 (ref 5.0–8.0)

## 2022-04-09 LAB — MRSA NEXT GEN BY PCR, NASAL: MRSA by PCR Next Gen: DETECTED — AB

## 2022-04-09 LAB — GLUCOSE, CAPILLARY: Glucose-Capillary: 127 mg/dL — ABNORMAL HIGH (ref 70–99)

## 2022-04-09 LAB — D-DIMER, QUANTITATIVE: D-Dimer, Quant: >20 ug/mL-FEU — ABNORMAL HIGH (ref 0.00–0.50)

## 2022-04-09 MED ORDER — ACETAMINOPHEN 650 MG RE SUPP: 650.0000 mg | Freq: Four times a day (QID) | RECTAL | Status: DC | PRN

## 2022-04-09 MED ORDER — SODIUM CHLORIDE 0.9 % IV SOLN
2.0000 g | Freq: Once | INTRAVENOUS | Status: AC
  Administered 2022-04-09: 2 g via INTRAVENOUS
  Filled 2022-04-09 (×2): qty 12.5

## 2022-04-09 MED ORDER — METRONIDAZOLE 500 MG/100ML IV SOLN
500.0000 mg | Freq: Once | INTRAVENOUS | Status: AC
  Administered 2022-04-09: 500 mg via INTRAVENOUS
  Filled 2022-04-09 (×2): qty 100

## 2022-04-09 MED ORDER — INSULIN ASPART 100 UNIT/ML IJ SOLN
0.0000 [IU] | Freq: Three times a day (TID) | INTRAMUSCULAR | Status: DC
  Administered 2022-04-09 – 2022-04-11 (×4): 1 [IU] via SUBCUTANEOUS
  Administered 2022-04-11: 2 [IU] via SUBCUTANEOUS

## 2022-04-09 MED ORDER — VANCOMYCIN HCL 1250 MG/250ML IV SOLN: 1250.0000 mg | INTRAVENOUS | Status: DC

## 2022-04-09 MED ORDER — VANCOMYCIN HCL 1250 MG/250ML IV SOLN
1250.0000 mg | Freq: Once | INTRAVENOUS | Status: AC
  Administered 2022-04-09: 1250 mg via INTRAVENOUS
  Filled 2022-04-09: qty 250

## 2022-04-09 MED ORDER — IOHEXOL 350 MG/ML SOLN
100.0000 mL | Freq: Once | INTRAVENOUS | Status: AC | PRN
  Administered 2022-04-09: 60 mL via INTRAVENOUS

## 2022-04-09 MED ORDER — LACTATED RINGERS IV SOLN: INTRAVENOUS | Status: DC

## 2022-04-09 MED ORDER — LACTATED RINGERS IV BOLUS (SEPSIS)
500.0000 mL | Freq: Once | INTRAVENOUS | Status: AC
  Administered 2022-04-09: 500 mL via INTRAVENOUS

## 2022-04-09 MED ORDER — ACETAMINOPHEN 325 MG PO TABS: 650.0000 mg | ORAL_TABLET | Freq: Four times a day (QID) | ORAL | Status: DC | PRN

## 2022-04-09 MED ORDER — ASPIRIN 81 MG PO CHEW
81.0000 mg | CHEWABLE_TABLET | Freq: Every day | ORAL | Status: DC
  Administered 2022-04-09 – 2022-04-13 (×5): 81 mg via ORAL
  Filled 2022-04-09 (×5): qty 1

## 2022-04-09 MED ORDER — POLYETHYLENE GLYCOL 3350 17 G PO PACK: 17.0000 g | PACK | Freq: Every day | ORAL | Status: DC | PRN

## 2022-04-09 MED ORDER — ACETAMINOPHEN 650 MG RE SUPP
650.0000 mg | Freq: Once | RECTAL | Status: AC
  Administered 2022-04-09: 650 mg via RECTAL
  Filled 2022-04-09: qty 1

## 2022-04-09 MED ORDER — ENOXAPARIN SODIUM 40 MG/0.4ML IJ SOSY
40.0000 mg | PREFILLED_SYRINGE | INTRAMUSCULAR | Status: DC
  Administered 2022-04-09 – 2022-04-12 (×4): 40 mg via SUBCUTANEOUS
  Filled 2022-04-09 (×4): qty 0.4

## 2022-04-09 MED ORDER — POLYETHYLENE GLYCOL 3350 17 G PO PACK
17.0000 g | PACK | Freq: Every day | ORAL | Status: DC
  Administered 2022-04-10 – 2022-04-13 (×4): 17 g via ORAL
  Filled 2022-04-09 (×5): qty 1

## 2022-04-09 MED ORDER — SODIUM CHLORIDE 0.9 % IV SOLN
2.0000 g | Freq: Two times a day (BID) | INTRAVENOUS | Status: DC
  Administered 2022-04-09 – 2022-04-10 (×2): 2 g via INTRAVENOUS
  Filled 2022-04-09 (×2): qty 12.5

## 2022-04-09 MED ORDER — SENNA 8.6 MG PO TABS
1.0000 | ORAL_TABLET | Freq: Every day | ORAL | Status: DC
  Administered 2022-04-09 – 2022-04-13 (×5): 8.6 mg via ORAL
  Filled 2022-04-09 (×5): qty 1

## 2022-04-09 MED ORDER — HYDROXYZINE HCL 10 MG PO TABS
10.0000 mg | ORAL_TABLET | Freq: Four times a day (QID) | ORAL | Status: DC | PRN
  Administered 2022-04-09: 10 mg via ORAL
  Filled 2022-04-09: qty 1

## 2022-04-09 MED ORDER — ENOXAPARIN SODIUM 30 MG/0.3ML IJ SOSY: 30.0000 mg | PREFILLED_SYRINGE | INTRAMUSCULAR | Status: DC

## 2022-04-09 NOTE — ED Notes (Signed)
Pt to CT and returned.  

## 2022-04-09 NOTE — ED Provider Triage Note (Signed)
Emergency Medicine Provider Triage Evaluation Note  Penny [name] , a 87 y.o. female  was evaluated in triage.  Pt presents via EMS with altered mental status, febrile to 103 Fahrenheit and hypoxic to the 80s on 6 L.   Review of Systems  Positive:  Negative:   Unable to obtain ROS due to altered mental status Physical Exam  BP (!) 158/78 (BP Location: Right Arm)   Pulse 89   Temp (!) 103.3 F (39.6 C) (Oral)   Resp 18   LMP  (LMP Unknown)   SpO2 98%  Gen:   Awake, no distress   Resp:  Normal effort lungs clear to auscultation bilaterally on 6 L supplemental oxygen by nonrebreather MSK:   Moves extremities without difficulty  Other:  Abdomen with mild distention but soft and nontender.  Patient alert to herself and oriented to year but not to place or why she is in the ED.  Very warm to the touch  Medical Decision Making  Medically screening exam initiated at 12:15 AM.  Appropriate orders placed.  Demi D Steichen was informed that the remainder of the evaluation will be completed by another provider, this initial triage assessment does not replace that evaluation, and the importance of remaining in the ED until their evaluation is complete.  No Tylenol given in route due to altered mental status.  Code sepsis activated due to febrile hypoxia with EMS, patient to be transported to room #24 once clean.  This chart was dictated using voice recognition software, Dragon. Despite the best efforts of this provider to proofread and correct errors, errors may still occur which can change documentation meaning.    Emeline Darling, PA-C 04/09/22 0018

## 2022-04-09 NOTE — ED Notes (Signed)
Tele tracking placed for pt at this time per nurses request

## 2022-04-09 NOTE — ED Notes (Signed)
Urine shows in process at 0755. Pt resting quietly with eyes closed.

## 2022-04-09 NOTE — ED Provider Notes (Signed)
  Physical Exam  BP 139/63   Pulse (!) 54   Temp 99.1 F (37.3 C) (Rectal)   Resp 13   Wt 63 kg   LMP  (LMP Unknown)   SpO2 100%   BMI 21.75 kg/m   Physical Exam  Procedures  Procedures  ED Course / MDM    Medical Decision Making Amount and/or Complexity of Data Reviewed Labs: ordered. Radiology: ordered.  Risk Prescription drug management.   Received patient in signout.  Syncope potential hypoxia mental status change.  Initially had fever.  Had been given antibiotics for potential sepsis.  Had not been hypotensive here.  Reassuring lactic acid.  However does have apparent UTI.  D-dimer had been done due to potential hypoxia and was grossly positive.  CT scan done and reassuring.  With mental status change syncope and UTI I believe she will benefit from mission to the hospital.  Does not appear to be a severe sepsis at this time.       Davonna Belling, MD 04/09/22 1158

## 2022-04-09 NOTE — ED Notes (Signed)
Called lab to check on UA. Report they did not receive urine. Confirmed it was sent by Penny [name].

## 2022-04-09 NOTE — ED Notes (Signed)
Pt returned from CT °

## 2022-04-09 NOTE — Progress Notes (Signed)
Lower extremity venous bilateral study completed.   Please see CV Proc for preliminary results.   Dinita Migliaccio, RDMS, RVT  

## 2022-04-09 NOTE — ED Notes (Signed)
Pt arrived to R32 from triage.

## 2022-04-09 NOTE — ED Provider Notes (Signed)
[facility] EMERGENCY DEPARTMENT Provider Note   CSN: [id number] Arrival date & time: 04/08/22  2345     History  Chief Complaint  Patient presents with   Altered Mental Status   Code Sepsis    Penny [name] is a 87 y.o. female.  Patient was sent to the emergency department for altered mental status.  Patient is currently in a skilled nursing facility.  She reportedly had a syncopal episode earlier tonight while on the commode but did not have any mental status changes.  Some hours later she was checked out and was found to be altered.  EMS report that the patient was hypoxic upon arrival, was placed on oxygen with improvement.       Home Medications Prior to Admission medications   Medication Sig Start Date End Date Taking? Authorizing Provider  acetaminophen (TYLENOL) 325 MG tablet Take 650 mg by mouth every 6 (six) hours as needed for mild pain.    [provider]  aspirin 81 MG tablet Take 81 mg by mouth daily.    [provider]  carvedilol (COREG) 12.5 MG tablet Take 12.5 mg by mouth 2 (two) times daily. 11/19/21   [provider]  feeding supplement, GLUCERNA SHAKE, (GLUCERNA SHAKE) LIQD Take 237 mLs by mouth 2 (two) times daily between meals.    [provider]  hydrALAZINE (APRESOLINE) 25 MG tablet Take 1 tablet (25 mg total) by mouth every 8 (eight) hours. 04/21/21   Hosie Poisson, MD  hydrOXYzine (ATARAX) 25 MG tablet Take 25 mg by mouth every 6 (six) hours as needed for itching. 11/09/21   [provider]  magnesium hydroxide (MILK OF MAGNESIA) 400 MG/5ML suspension Take 30 mLs by mouth every 12 (twelve) hours as needed for mild constipation.    [provider]  melatonin 3 MG TABS tablet Take 3 mg by mouth at bedtime.    [provider]  metFORMIN (GLUCOPHAGE) 500 MG tablet Take 1 tablet (500 mg total) by mouth daily with breakfast. 11/27/21   Dwyane Dee, MD  Multiple Vitamins-Minerals  (MULTIVITAMIN ADULTS) TABS Take 1 tablet by mouth daily.    [provider]  polyethylene glycol (MIRALAX / GLYCOLAX) 17 g packet Take 17 g by mouth daily. 05/17/21   Mariel Aloe, MD  sennosides-docusate sodium (SENOKOT-S) 8.6-50 MG tablet Take 2 tablets by mouth at bedtime.    [provider]      Allergies    Glipizide    Review of Systems   Review of Systems  Physical Exam Updated Vital Signs BP 133/60   Pulse 62   Temp 99.1 F (37.3 C) (Rectal)   Resp 18   Wt 63 kg   LMP  (LMP Unknown)   SpO2 100%   BMI 21.75 kg/m  Physical Exam Vitals and nursing note reviewed.  Constitutional:      General: She is not in acute distress.    Appearance: She is well-developed.  HENT:     Head: Normocephalic and atraumatic.     Mouth/Throat:     Mouth: Mucous membranes are moist.  Eyes:     General: Vision grossly intact. Gaze aligned appropriately.     Extraocular Movements: Extraocular movements intact.     Conjunctiva/sclera: Conjunctivae normal.  Cardiovascular:     Rate and Rhythm: Normal rate and regular rhythm.     Pulses: Normal pulses.     Heart sounds: Normal heart sounds, S1 normal and S2 normal. No murmur  heard.    No friction rub. No gallop.  Pulmonary:     Effort: Pulmonary effort is normal. No respiratory distress.     Breath sounds: Normal breath sounds.  Abdominal:     General: Bowel sounds are normal.     Palpations: Abdomen is soft.     Tenderness: There is no abdominal tenderness. There is no guarding or rebound.     Hernia: No hernia is present.  Musculoskeletal:        General: No swelling.     Cervical back: Full passive range of motion without pain, normal range of motion and neck supple. No spinous process tenderness or muscular tenderness. Normal range of motion.     Right lower leg: No edema.     Left lower leg: No edema.  Skin:    General: Skin is warm and dry.     Capillary Refill: Capillary refill takes less than 2 seconds.      Findings: No ecchymosis, erythema, rash or wound.  Neurological:     General: No focal deficit present.     Mental Status: She is lethargic.     GCS: GCS eye subscore is 4. GCS verbal subscore is 5. GCS motor subscore is 6.     Cranial Nerves: Cranial nerves 2-12 are intact.  Psychiatric:        Attention and Perception: Attention normal.        Mood and Affect: Mood normal.        Speech: Speech normal.        Behavior: Behavior normal.     ED Results / Procedures / Treatments   Labs (all labs ordered are listed, but only abnormal results are displayed) Labs Reviewed  COMPREHENSIVE METABOLIC PANEL - Abnormal; Notable for the following components:      Result Value   Sodium 133 (*)    Glucose, Bld 197 (*)    BUN 24 (*)    Creatinine, Ser 1.30 (*)    Calcium 8.7 (*)    Albumin 2.4 (*)    GFR, Estimated 40 (*)    All other components within normal limits  CBC WITH DIFFERENTIAL/PLATELET - Abnormal; Notable for the following components:   WBC 13.5 (*)    RBC 3.27 (*)    Hemoglobin 10.1 (*)    HCT 32.1 (*)    Neutro Abs 12.4 (*)    Lymphs Abs 0.5 (*)    Abs Immature Granulocytes 0.11 (*)    All other components within normal limits  RESP PANEL BY RT-PCR (FLU A&B, COVID) ARPGX2  CULTURE, BLOOD (ROUTINE X 2)  CULTURE, BLOOD (ROUTINE X 2)  URINE CULTURE  LACTIC ACID, PLASMA  LACTIC ACID, PLASMA  PROTIME-INR  APTT  URINALYSIS, ROUTINE W REFLEX MICROSCOPIC  D-DIMER, QUANTITATIVE    EKG None  Radiology DG Chest Port 1 View  Result Date: 04/09/2022 CLINICAL DATA:  Questionable sepsis evaluate for abnormality EXAM: PORTABLE CHEST 1 VIEW COMPARISON:  11/24/2021 FINDINGS: Stable cardiomediastinal silhouette. Aortic atherosclerotic calcification. Elevated right hemidiaphragm. No focal consolidation, pleural effusion, or pneumothorax. No acute osseous abnormality. Remote left rib fractures IMPRESSION: No active disease. Electronically Signed   By: Placido Sou M.D.   On:  04/09/2022 02:19    Procedures Procedures    Medications Ordered in ED Medications  lactated ringers infusion ( Intravenous New Bag/Given 04/09/22 0308)  vancomycin (VANCOREADY) IVPB 1250 mg/250 mL (has no administration in time range)  ceFEPIme (MAXIPIME) 2 g in sodium chloride 0.9 % 100  mL IVPB (has no administration in time range)  lactated ringers bolus 500 mL (0 mLs Intravenous Stopped 04/09/22 0410)  ceFEPIme (MAXIPIME) 2 g in sodium chloride 0.9 % 100 mL IVPB (0 g Intravenous Stopped 04/09/22 0330)  metroNIDAZOLE (FLAGYL) IVPB 500 mg (0 mg Intravenous Stopped 04/09/22 0443)  vancomycin (VANCOREADY) IVPB 1250 mg/250 mL (0 mg Intravenous Stopped 04/09/22 0443)  acetaminophen (TYLENOL) suppository 650 mg (650 mg Rectal Given 04/09/22 4536)    ED Course/ Medical Decision Making/ A&P                           Medical Decision Making Amount and/or Complexity of Data Reviewed Labs: ordered.  Risk Prescription drug management.   Patient presents to the emergency department for evaluation of mental status changes.  Patient is reportedly much more alert and is oriented at baseline.  On arrival she is not able to answer questions.  She attempts to follow some commands.  Patient found to be febrile, sepsis work-up and treatment including IV fluids and broad-spectrum antibiotics initiated at time of MSE.  She does have a mild leukocytosis, white blood cell count is 13.5.  With her hypoxia, pulmonary etiology was considered highly likely, however initial chest x-ray does not show pneumonia or other pathology.  BUN and creatinine mildly elevated, suspect some element of dehydration.  She has been given fluid resuscitation.  Remainder of electrolytes unremarkable.  Lactic acid is not elevated.  COVID swab negative.  Awaiting urinalysis to determine if there is a urinary source for her fever.  No obvious meningitic signs on exam.         Final Clinical Impression(s) / ED Diagnoses Final  diagnoses:  Altered mental status, unspecified altered mental status type  Fever, unspecified    Rx / DC Orders ED Discharge Orders     None         Delynda Sepulveda, Gwenyth Allegra, MD 04/09/22 [phone]

## 2022-04-09 NOTE — H&P (Cosign Needed Addendum)
Date: 04/09/2022               Patient Name:  Penny [name] MRN: [id number]  DOB: 13-Jan-1935 Age / Sex: 87 y.o., female   PCP: Penny Brothers, MD         Medical Service: Internal Medicine Teaching Service         Attending Physician: Dr. Lucious Groves, DO    First Contact: Dr. Roswell Nickel MD   Pager: [phone]   Second Contact: Penny [name]  Pager: Penny [name] [phone]       After Hours (After 5p/  First Contact Pager: [phone]  weekends / holidays): Second Contact Pager: [phone]   SUBJECTIVE   Chief Complaint: Altered Mental Status  History of Present Illness:   Penny [name] is an 87 y/o person living with a history of type 2 diabetes, TIA, osteoarthritis, acute on chronic CKD 3A, atopic dermatitis who presented today from [facility] for altered mental status. Per [facility] she was in her usual state of health until yesterday evening when she was altered and somnolent.  They also reported multiple syncopal episodes.  Her baseline mental status is alert and oriented x4 and she is quite active per her regular nurse at the facility.  Per EMS run sheet patient was altered and with dyspnea.  She had a syncopal episode while on the commode and did not have any fall or injuries from this.  She regained consciousness shortly after and was assisted back to her bed.  She continued to be altered and felt warm, so EMS was called and she was brought into the hospital.  On my evaluation she is pleasant appearing and alert and oriented x4.  She denies any recollection of the events listed above.  She is able to answer all my questions appropriately she denies being in any discomfort or pain.  She does endorse chills.  She denies any fevers, chest pain, shortness of breath, cough, abdominal pain, nausea, vomiting, diarrhea.  Denies any dysuria or increased urinary frequency.  She does have pain in her bilateral lower extremities.  She denies having any injuries to her legs.  She  endorses a rash of her upper extremities and chest wall that have been present for a [place] time, she states she has a follow-up with a dermatologist in the coming days for this.   Meds:  Aspirin 81 mg daily Gabapentin 100 mg nightly Hydralazine 25 mg 3 times daily Melatonin 3 mg daily Metformin 500 mg daily Multivitamin Senokot 2 tabs every 12 hours  Past Medical History Type 2 diabetes TIA Hypertension  Past Surgical History:  Procedure Laterality Date   INCISION AND DRAINAGE HIP Right 05/11/2021   Procedure: IRRIGATION AND DEBRIDEMENT HIP PLACEMENT OF WOUND VAC;  Surgeon: Penny Ade, MD;  Location: [facility];  Service: Orthopedics;  Laterality: Right;    Social:  Lives at [facility] Occupation: Retired, Armed forces logistics/support/administrative officer prior  Support: Niece in the area, lives at [facility] Level of Function: Able to perform ADL/IADL with assistance PCP: Dr. Reesa [name] Substances: Quit tobacco use many years ago. Denies alcohol or other substance use  Family History:  Denies family history of cancer   Allergies: Allergies as of 04/08/2022 - Review Complete 11/24/2021  Allergen Reaction Noted   Glipizide Rash 04/18/2021   Review of Systems: A complete ROS was negative except as per HPI.   OBJECTIVE:   Physical Exam: Blood pressure (!) 138/109, pulse 60,  temperature 97.8 F (36.6 C), temperature source Oral, resp. rate 18, weight 63 kg, SpO2 100 %.  Constitutional: Well-appearing, no acute distress HENT: normocephalic atraumatic, mucous membranes dry Eyes: conjunctiva non-erythematous Neck: supple Cardiovascular: regular rate and rhythm, no m/r/g Pulmonary/Chest: normal work of breathing on room air, lungs clear to auscultation bilaterally Abdominal: soft, non-tender, non-distended MSK: normal bulk and tone.  Right knee and ankle joints with normal range of motion, no erythema, warmth, or swelling. Neurological: alert & oriented x 3 bilateral upper and  lower extremity strength  Skin: Erythema of right lower extremity with warmth and 1+ edema.  Erythema tracks medial aspect of right lower extremity up to the groin.  Healing laceration to the lateral aspect of the right lower extremity Psych: Normal mood, pleasant.        Labs: CBC    Component Value Date/Time   WBC 13.5 (H) 04/09/2022 0013   RBC 3.27 (L) 04/09/2022 0013   HGB 10.1 (L) 04/09/2022 0013   HCT 32.1 (L) 04/09/2022 0013   PLT 165 04/09/2022 0013   MCV 98.2 04/09/2022 0013   MCH 30.9 04/09/2022 0013   MCHC 31.5 04/09/2022 0013   RDW 13.3 04/09/2022 0013   LYMPHSABS 0.5 (L) 04/09/2022 0013   MONOABS 0.3 04/09/2022 0013   EOSABS 0.0 04/09/2022 0013   BASOSABS 0.0 04/09/2022 0013     CMP     Component Value Date/Time   NA 133 (L) 04/09/2022 0013   K 4.0 04/09/2022 0013   CL 100 04/09/2022 0013   CO2 24 04/09/2022 0013   GLUCOSE 197 (H) 04/09/2022 0013   BUN 24 (H) 04/09/2022 0013   CREATININE 1.30 (H) 04/09/2022 0013   CALCIUM 8.7 (L) 04/09/2022 0013   PROT 8.0 04/09/2022 0013   ALBUMIN 2.4 (L) 04/09/2022 0013   AST 30 04/09/2022 0013   ALT 30 04/09/2022 0013   ALKPHOS 69 04/09/2022 0013   BILITOT 1.0 04/09/2022 0013   GFRNONAA 40 (L) 04/09/2022 0013    Imaging: Chest Xray - no focal consolidation present, effusion.   CT head without contrast-no acute intracranial abnormalities.  Some chronic microvascular ischemic changes  CTA chest-pulmonary embolism absent.  Scattered tiny pulmonary nodules unchanged from prior non-contrast.  Multiple predominantly left-sided thyroid nodules.  CT abdomen pelvis with contrast -moderate stool burden.  Intraluminal air within the bladder  EKG: pending  ASSESSMENT & PLAN:   Assessment & Plan by Problem: Principal Problem:   Cellulitis of right lower extremity Active Problems:   Acute metabolic encephalopathy  Penny [name] is a 87 y.o. person living with a history of  type 2 diabetes, TIA, osteoarthritis,  acute on chronic CKD 3A, atopic dermatitis who presented after multiple stable episodes and being altered at her facility and admitted for cellulitis of the right lower extremity on hospital day 0  Cellulitis of right lower extremity Leukocytosis Patient presented with altered mental status and found to be febrile to 103F in the ED with a leukocytosis of 13.5. Lactic acid normal. Right lower extremity with cellulitis and tracking along the medial aspect of her right thigh into her groin. Will continue abx and obtain RLE [place] to rule out a DVT w/ elevated d-dimer over 20. Per nursing facility she has recurrent cellulitis in her lower extremities. Do not believe involvement of knee or ankle joints.  -Continue vancomycin and cefepime day 1/5, narrow tomorrow if improvement -Follow-up blood cultures -Ultrasound of bilateral lower extremities to rule out DVT  Acute metabolic encephalopathy secondary to  cellulitis Patient's mentation is improved on my examination she is alert and oriented x3 and appears to be at her baseline per documentation and speaking with nursing facility. -Continue to monitor -Hold gabapentin and melatonin  Acute hypoxic respiratory failure Initially patient presented requiring supplemental oxygen on my examination she is oxygenating well on room air, not in respiratory distress. Pulmonary exam unremarkable. Chest xray and CTA chest without focal opacity or effusions. Do not suspect respiratory infection contributing to the above. If worsening oxygen requirement, can consider repeat chest xray in event she aspirated during syncopal episode.  -Continue to monitor oxygen levels, O2 goal of 90-95%  Pyuria Bacteriuria Intraluminal air within bladder Patient with history of recurrent urinary tract infections.  Recent urinary cultures (12/22) positive for klebsiella pneumonia and only sensitive to imipenem and Zosyn.  Last urine cultures from 5/23 positive for group B strep.  She is  asymptomatic and I do not suspect her systemic symptoms or from her UA findings.  It was sent for culture while in the ED. Possible air within bladder if in/out catheterization to obtain urine sample -Monitor for signs of urinary tract infection  CKD 3a Baseline creatinine seems to be around 1.2 on prior admission. Cr of 1.3 today.  -daily BMP  Syncope Per nursing facility and EMS run sheet patient with syncopal episode after using the bathroom yesterday and remained somnolent after the event. Head imaging negative for any acute process. Likely vasovagal and orthostatic in setting of acute illness. Do not suspect cardiogenic etiology.  -orthostatics pending -PT evaluation -EKG pending  Hyponatremia Likely hypovolemic in setting of acute illness. Was started on IV fluids in the ED, now that she is alert and oriented will discontinue fluids and encourage PO fluids.  -follow up sodium tomorrow  Anemia of chronic disease Hgb of 10.1, MCV of 98.2. Likely in setting of anemia of chronic disease. Iron studies on 5/23 iron of 32, sat of 19%.  -continue to follow outpatient.   Constipation Home regimen of senna, moderate stool burden on imaging -daily miralax and senna  Chronic Atopic Dermatitis Patient with history of dermatitis of the chest wall and upper extremities that she notes is pruritic. On exam has has hyperpigmentation of these areas. She states she has a follow up with dermatology in the next few days -Follow up dermatology outpatient  Hx of type 2 diabetes mellitus Home regimen of metformin, last A1c 4 months ago of 6.4%. -SSI -A1c tomorrow  Hx of hypertension -hold hydralazine in setting of acute illness  Hx of TIA Resume home 81 mg aspirin. No acute process on CT imaging of head.   Advanced care planning DNR and MOST form at bedside. Confirms DNR status and MOST form with limited antibiotic use and hospital admission for treatment. Would want use of non-invasive  ventilatory support if needed. Her Niece [name] is her primary contact, unable to reach her by phone, let HIPPA compliant voice mail.   Abnormal Imaging Patient with multiple abnormal findings on CT imaging, do not believe related to presentation but will need outpatient follow up;  -thyroid nodules, follow up outpatient [place] -pulmonary nodules, unchanged from prior.  -nodular soft tissue abnormality in anterior mediastinal, will need non emergent chest MRI follow up  Diet: Carb-Modified VTE: Enoxaparin IVF: None,None Code: DNR  Prior to Admission Living Arrangement:  [facility] Anticipated Discharge Location:  [facility] Barriers to Discharge: IV abx  Dispo: Admit patient to Observation with expected length of  stay less than 2 midnights.  Signed: Riesa Pope, MD Internal Medicine Resident PGY-3  04/09/2022, 2:24 PM

## 2022-04-09 NOTE — Progress Notes (Signed)
Pt being followed by [facility] for Sepsis protocol. 

## 2022-04-09 NOTE — ED Triage Notes (Addendum)
Per EMS, pt from [place], had a syncopal episode as while on the commode w/ staff present around 6pm today, no fall or injury.  Pt was alert and oriented, no change to self.  Around 11pm  they found pt altered and called EMS  88%RA, shallow and fast respirations, placed on 6L, maintaining O2,  140/68 HR 86 regular CBG 268  SHe is alert to year and self.

## 2022-04-09 NOTE — Progress Notes (Signed)
Pharmacy Antibiotic Note  Penny [name] is a 87 y.o. female admitted on 04/09/2022 after a syncopal episode with subsequent AMS.  Pharmacy has been consulted for vancomycin and cefepime dosing for sepsis.  SCr 1.3, Tmax 103.3, WBC 13.5.  Plan: Vanc '1250mg'$  IV Q48H Cefepime 2gm IV Q12H Monitor renal fxn, clinical progress, vanc levels as indicated  Weight: 63 kg (138 lb 14.2 oz)  Temp (24hrs), Avg:103.3 F (39.6 C), Min:103.3 F (39.6 C), Max:103.3 F (39.6 C)  Recent Labs  Lab 04/09/22 0013  WBC 13.5*  CREATININE 1.30*    Estimated Creatinine Clearance: 30.8 mL/min (A) (by C-G formula based on SCr of 1.3 mg/dL (H)).    Allergies  Allergen Reactions   Glipizide Rash   Vanc 9/25 >> Cefepime 9/25 >> Flagyl x1 9/25 >>   9/25 UCx -  9/25 BCx -  9/25 resp panel PCR -   Supriya Beaston D. Mina Marble, PharmD, BCPS, [place] 04/09/2022, 1:42 AM

## 2022-04-09 NOTE — ED Notes (Addendum)
Pt resting eyes, bilateral chest rise and fall, NAD.

## 2022-04-10 DIAGNOSIS — L03115 Cellulitis of right lower limb: Secondary | ICD-10-CM | POA: Diagnosis not present

## 2022-04-10 DIAGNOSIS — R509 Fever, unspecified: Secondary | ICD-10-CM

## 2022-04-10 LAB — BASIC METABOLIC PANEL
Anion gap: 4 — ABNORMAL LOW (ref 5–15)
BUN: 26 mg/dL — ABNORMAL HIGH (ref 8–23)
CO2: 26 mmol/L (ref 22–32)
Calcium: 8.4 mg/dL — ABNORMAL LOW (ref 8.9–10.3)
Chloride: 104 mmol/L (ref 98–111)
Creatinine, Ser: 1.12 mg/dL — ABNORMAL HIGH (ref 0.44–1.00)
GFR, Estimated: 48 mL/min — ABNORMAL LOW (ref >60)
Glucose, Bld: 124 mg/dL — ABNORMAL HIGH (ref 70–99)
Potassium: 4.1 mmol/L (ref 3.5–5.1)
Sodium: 134 mmol/L — ABNORMAL LOW (ref 135–145)

## 2022-04-10 LAB — GLUCOSE, CAPILLARY
Glucose-Capillary: 100 mg/dL — ABNORMAL HIGH (ref 70–99)
Glucose-Capillary: 132 mg/dL — ABNORMAL HIGH (ref 70–99)
Glucose-Capillary: 147 mg/dL — ABNORMAL HIGH (ref 70–99)
Glucose-Capillary: 159 mg/dL — ABNORMAL HIGH (ref 70–99)

## 2022-04-10 LAB — CBC WITH DIFFERENTIAL/PLATELET
Abs Immature Granulocytes: 0.24 10*3/uL — ABNORMAL HIGH (ref 0.00–0.07)
Basophils Absolute: 0 10*3/uL (ref 0.0–0.1)
Basophils Relative: 0 %
Eosinophils Absolute: 0.7 10*3/uL — ABNORMAL HIGH (ref 0.0–0.5)
Eosinophils Relative: 5 %
HCT: 29.4 % — ABNORMAL LOW (ref 36.0–46.0)
Hemoglobin: 9.2 g/dL — ABNORMAL LOW (ref 12.0–15.0)
Immature Granulocytes: 2 %
Lymphocytes Relative: 4 %
Lymphs Abs: 0.5 10*3/uL — ABNORMAL LOW (ref 0.7–4.0)
MCH: 30.7 pg (ref 26.0–34.0)
MCHC: 31.3 g/dL (ref 30.0–36.0)
MCV: 98 fL (ref 80.0–100.0)
Monocytes Absolute: 0.8 10*3/uL (ref 0.1–1.0)
Monocytes Relative: 6 %
Neutro Abs: 11.3 10*3/uL — ABNORMAL HIGH (ref 1.7–7.7)
Neutrophils Relative %: 83 %
Platelets: 161 10*3/uL (ref 150–400)
RBC: 3 MIL/uL — ABNORMAL LOW (ref 3.87–5.11)
RDW: 13.2 % (ref 11.5–15.5)
WBC: 13.6 10*3/uL — ABNORMAL HIGH (ref 4.0–10.5)
nRBC: 0 % (ref 0.0–0.2)

## 2022-04-10 LAB — HEMOGLOBIN A1C
Hgb A1c MFr Bld: 6.7 % — ABNORMAL HIGH (ref 4.8–5.6)
Mean Plasma Glucose: 145.59 mg/dL

## 2022-04-10 MED ORDER — CAMPHOR-MENTHOL 0.5-0.5 % EX LOTN
TOPICAL_LOTION | CUTANEOUS | Status: DC | PRN
  Filled 2022-04-10: qty 222

## 2022-04-10 MED ORDER — HYDROXYZINE HCL 25 MG PO TABS
25.0000 mg | ORAL_TABLET | Freq: Three times a day (TID) | ORAL | Status: DC | PRN
  Administered 2022-04-10: 25 mg via ORAL
  Filled 2022-04-10: qty 1

## 2022-04-10 MED ORDER — TRIAMCINOLONE 0.1 % CREAM:EUCERIN CREAM 1:1
TOPICAL_CREAM | Freq: Two times a day (BID) | CUTANEOUS | Status: DC
  Filled 2022-04-10: qty 1

## 2022-04-10 MED ORDER — VANCOMYCIN HCL 750 MG/150ML IV SOLN
750.0000 mg | INTRAVENOUS | Status: DC
  Filled 2022-04-10: qty 150

## 2022-04-10 MED ORDER — CEFAZOLIN SODIUM-DEXTROSE 2-4 GM/100ML-% IV SOLN
2.0000 g | Freq: Two times a day (BID) | INTRAVENOUS | Status: DC
  Administered 2022-04-10 (×2): 2 g via INTRAVENOUS
  Filled 2022-04-10 (×3): qty 100

## 2022-04-10 NOTE — Hospital Course (Addendum)
Penny [name] is a 87 y.o. woman with a history of DM2, TIA, osteoarthritis, acute on chronic CKD 3A, atopic dermatitis, and multiple hospitalizations for UTI's who presented with acute encephalopathy in the setting of sepsis and was admitted for right lower extremity cellulitis. Below is her hospital course by problem.  #Cellulitis of right lower extremity #Acute encephalopathy secondary to sepsis #Acute hypoxic respiratory failure secondary to sepsis Patient presented with acute encephalopathy and acute hypoxic respiratory failure in the setting of sepsis from right lower extremity cellulitis. RLE [place] for DVT was negative. In the ED, patient required supplemental O2, but was stable on room air once admitted. She presented with altered mental status in ED, but was back to baseline during admission exam. She was started on broad spectrum antibiotics including Cefipime, vancomycin, and Flagyl on 9/25, which was changed to Ancef on 9/26 given source of infection was cellulitis. The cellulitis in her RLE gradually improved with the antibiotics and patient remained afebrile and hemodynamically stable throughout hospitalization. She transitioned to oral Keflex on 9/27 and will be on a course of   #Syncope Patient had multiple syncopal episodes on the day of her presentation per nursing facility and EMS. Head CT was unremarkable. EKG was normal rate sinus rhythm with no ischemic changes. No cardiac symptoms. Patient's fatigue and weakness improved with fluids. She had no further syncope during hospitalization. She was able to walk with assistance during hospitalization, which per PT, is slightly off her baseline. She will be discharged to skilled nursing facility - short term rehabilitation.  #Hx of Atopic Dermatitis Patient was given triamcinolone for her bilateral upper arm atopic dermatitis. Patient's niece reports patient will have dermatology appointment on 10/11. Patient's niece reported that patient will  get itch in her legs and will sometimes scratch her legs with her toenails - this could be a potential source of her recurrent cellulitis.   #Hx of type 2 diabetes mellitus A1c this admission 6.7%. Patient's home regimen is metformin. Patient was on SSI and her CBG were stable during this hospitalization.    #CKD 3a Presented with 1.3 Cr. Baseline Cr around 1.2 prior to admission. Patient's kidney function improved with fluids and she was discharged with Cr of ***.    Discharge Instructions  [name], you were hospitalized for an infection of the skin (cellulitis) in your right leg that led to some confusion and fainting when you presented to the emergency department. Your symptoms improved with rest and fluids, and your skin infection improved with antibiotics.  As you will participate in rehab at your skilled nursing facility, we will be sending you with a *** day course of antibiotics  Please follow-up in 2 weeks with your PCP or dermatologist for your skin infection and atopic dermatitis.  Please note these changes made to your medications:   Please START taking:    Please STOP taking:   If you experience worsening of your skin infection including more swelling, redness, or warmth in the leg or if you experience fever and confusion, please return to the emergency department for evaluation.   It was a pleasure taking care of you, thank you for allowing [place] to be part of your care.   Max, Dr. Eloisa Northern, and Dr. Alfonse Spruce    9/26 Patient is evaluated in bed this AM. Patient is A&O x 3, knows her name, the date, and situation. Was slightly cold overnight. Denies dysuria and difficulty urinating. Denies SOB.   Skin: right lower leg appears more  errythematous and warmer compared to left leg. Erythema on the right leg extends to the medial knee area  9/27 Was seen in bed this AM. She is concerned about her cellulitis. She feels that the steroid cream is helpful with her dermatitis. Cannot  recall if she has a penicillin allergy. Has been to SNF since November 2022.   9/28 Patient was seen this AM. She feels good. Reports not having a BM while she is in the hospital.

## 2022-04-10 NOTE — Progress Notes (Addendum)
Pharmacy Antibiotic Note  Penny [name] is a 87 y.o. female admitted on 04/09/2022 with sepsis likely from RLE cellulitis.  Pharmacy has been consulted for vancomycin and cefepime dosing for sepsis.  SCr down to 1.12. afebrile. WBC still 13.   9/26 Vancomycin '750mg'$  Q 24 hr Scr used: 1.12 mg/dL Weight: 63 kg Vd coeff: 0.72 L/kg Est AUC: 486    Plan: Increase Vancomycin '740mg'$  IV Q24H Continue Cefepime 2gm IV Q12H Monitor cultures, clinical status, renal function, vancomycin level Narrow abx as able and f/u duration    Weight: 63 kg (138 lb 14.2 oz)  Temp (24hrs), Avg:98.6 F (37 C), Min:97.8 F (36.6 C), Max:99.6 F (37.6 C)  Recent Labs  Lab 04/09/22 0013 04/09/22 0320 04/10/22 0021  WBC 13.5*  --  13.6*  CREATININE 1.30*  --  1.12*  LATICACIDVEN 1.2 1.1  --      Estimated Creatinine Clearance: 35.7 mL/min (A) (by C-G formula based on SCr of 1.12 mg/dL (H)).    Allergies  Allergen Reactions   Glipizide Rash   Vanc 9/25 >> Cefepime 9/25 >> Flagyl x1 9/25    9/25 UCx - reincubated  9/25 BCx - pending 9/25 MRSA +  9/25 resp panel PCR - neg   ADDENDUM 10:00- narrow to cefazolin per team  Benetta Spar, PharmD, BCPS, [place] Clinical Pharmacist  Please check AMION for all [place] phone numbers After 10:00 PM, call [place] [phone]

## 2022-04-10 NOTE — Progress Notes (Signed)
Mobility Specialist - Progress Note   04/10/22 1525  Mobility  Activity Transferred from chair to bed  Level of Assistance Moderate assist, patient does 50-74%  Assistive Device Front wheel walker  Distance Ambulated (ft) 0 ft  Activity Response Tolerated well  $Mobility charge 1 Mobility    Pt received in chair requesting to transfer to bed. Left in bed w/ call bell in reach and all needs met.   Paulla Dolly Mobility Specialist

## 2022-04-10 NOTE — Progress Notes (Signed)
Patient's POA, [name], called requesting a dermatology consult. MD states no consult is required at this time, but will order steroid cream for patient.

## 2022-04-10 NOTE — Progress Notes (Signed)
Subjective:  Overnight Events: No acute events or concerns overnight.  Patient is evaluated at bedside this AM. Patient is A&O x3 - knows her name, date, and location. She reports being cold overnight. Denies dysuria and difficulty urinating. Denies fever or SOB. Patient reports no trauma to legs or previous swelling like this in leg.  Collateral information from niece: reports that given patient's history of atopic dermatitis, patient will get itch in her legs and will sometimes scratch her legs with her toenails.  Objective:  Vital signs in last 24 hours: Vitals:   04/09/22 2010 04/10/22 0002 04/10/22 0457 04/10/22 0745  BP: 136/72 136/64 (!) 176/76 124/65  Pulse: 65 66 68 63  Resp: '19 16 16 18  '$ Temp: 99.6 F (37.6 C) 98.9 F (37.2 C) 98.7 F (37.1 C) 98.7 F (37.1 C)  TempSrc: Oral Oral Oral Oral  SpO2: 98% 99% 99% 96%  Weight:       Weight change:   Intake/Output Summary (Last 24 hours) at 04/10/2022 0820 Last data filed at 04/09/2022 1550 Gross per 24 hour  Intake 100.18 ml  Output --  Net 100.18 ml    Physical Exam   Constitutional: Pleasant, mildly fatigued and sitting in bed, in no acute distress. Cardiovascular: regular rate with normal rhythm, no murmurs. Pedal pulse 2+ bilaterally. Pulmonary/Chest: normal work of breathing on room air, lungs clear to auscultation bilaterally Abdominal: soft, non-tender, non-distended, bowel sounds present MSK: Normal bulk and tone. No left lower extremity edema.  Skin: warm and dry. Right lower shin to below right knee with edema and warmth. Erythema tracks to medial aspect of right lower extremity up to groin. No warmth or swelling in right knee. Neurological: Alert and oriented to person, place, and time. Answering questions appropriately.  Labs: CBC    Component Value Date/Time   WBC 13.6 (H) 04/10/2022 0021   RBC 3.00 (L) 04/10/2022 0021   HGB 9.2 (L) 04/10/2022 0021   HCT 29.4 (L) 04/10/2022 0021   PLT 161  04/10/2022 0021   MCV 98.0 04/10/2022 0021   MCH 30.7 04/10/2022 0021   MCHC 31.3 04/10/2022 0021   RDW 13.2 04/10/2022 0021   LYMPHSABS 0.5 (L) 04/10/2022 0021   MONOABS 0.8 04/10/2022 0021   EOSABS 0.7 (H) 04/10/2022 0021   BASOSABS 0.0 04/10/2022 0021     CMP     Component Value Date/Time   NA 134 (L) 04/10/2022 0021   K 4.1 04/10/2022 0021   CL 104 04/10/2022 0021   CO2 26 04/10/2022 0021   GLUCOSE 124 (H) 04/10/2022 0021   BUN 26 (H) 04/10/2022 0021   CREATININE 1.12 (H) 04/10/2022 0021   CALCIUM 8.4 (L) 04/10/2022 0021   PROT 8.0 04/09/2022 0013   ALBUMIN 2.4 (L) 04/09/2022 0013   AST 30 04/09/2022 0013   ALT 30 04/09/2022 0013   ALKPHOS 69 04/09/2022 0013   BILITOT 1.0 04/09/2022 0013   GFRNONAA 48 (L) 04/10/2022 0021    Imaging: VAS [place] LOWER EXTREMITY VENOUS (DVT)  Result Date: 04/09/2022  Lower Venous DVT Study Patient Name:  Penny [name]  Date of Exam:   04/09/2022 Medical Rec #: [id number]     Accession #:    [id number] Date of Birth: 30-Dec-1934     Patient Gender: F Patient Age:   87 years Exam Location:  [facility] Procedure:      VAS [place] LOWER EXTREMITY VENOUS (DVT) Referring Phys: ERIK HOFFMAN --------------------------------------------------------------------------------  Indications: Erythema, edema RT>LT.  Limitations:  Poor ultrasound/tissue interface and Patient sensitivity to compression/probe pressure. Limited patient mobility. Comparison Study: 07-06-2021 Prior bilateral lower extremity venous study was                   negative for DVT. Performing Technologist: Darlin Coco RDMS, RVT  Examination Guidelines: A complete evaluation includes B-mode imaging, spectral Doppler, color Doppler, and power Doppler as needed of all accessible portions of each vessel. Bilateral testing is considered an integral part of a complete examination. Limited examinations for reoccurring indications may be performed as noted. The reflux portion of the exam is performed with  the patient in reverse Trendelenburg.  +---------+---------------+---------+-----------+----------+--------------+ RIGHT    CompressibilityPhasicitySpontaneityPropertiesThrombus Aging +---------+---------------+---------+-----------+----------+--------------+ CFV      Full           Yes      Yes                                 +---------+---------------+---------+-----------+----------+--------------+ SFJ      Full                                                        +---------+---------------+---------+-----------+----------+--------------+ FV Prox  Full           Yes      Yes                                 +---------+---------------+---------+-----------+----------+--------------+ FV Mid   Full                                                        +---------+---------------+---------+-----------+----------+--------------+ FV Distal               Yes      Yes                                 +---------+---------------+---------+-----------+----------+--------------+ PFV                     Yes      Yes                                 +---------+---------------+---------+-----------+----------+--------------+ POP                     Yes      Yes                                 +---------+---------------+---------+-----------+----------+--------------+ PTV      Full                                                        +---------+---------------+---------+-----------+----------+--------------+ PERO  Full                                                        +---------+---------------+---------+-----------+----------+--------------+   +---------+---------------+---------+-----------+----------+--------------+ LEFT     CompressibilityPhasicitySpontaneityPropertiesThrombus Aging +---------+---------------+---------+-----------+----------+--------------+ CFV      Full           Yes      Yes                                  +---------+---------------+---------+-----------+----------+--------------+ SFJ      Full                                                        +---------+---------------+---------+-----------+----------+--------------+ FV Prox  Full                                                        +---------+---------------+---------+-----------+----------+--------------+ FV Mid   Full                                                        +---------+---------------+---------+-----------+----------+--------------+ FV DistalFull                                                        +---------+---------------+---------+-----------+----------+--------------+ PFV      Full                                                        +---------+---------------+---------+-----------+----------+--------------+ POP                     Yes      Yes                                 +---------+---------------+---------+-----------+----------+--------------+ PTV                     Yes      Yes                                 +---------+---------------+---------+-----------+----------+--------------+ PERO                    Yes      Yes                                 +---------+---------------+---------+-----------+----------+--------------+  Summary: RIGHT: - There is no evidence of deep vein thrombosis in the lower extremity. However, portions of this examination were limited- see technologist comments above.  - No cystic structure found in the popliteal fossa.  LEFT: - There is no evidence of deep vein thrombosis in the lower extremity. However, portions of this examination were limited- see technologist comments above.  - No cystic structure found in the popliteal fossa.  *See table(s) above for measurements and observations. Electronically signed by Servando Snare MD on 04/09/2022 at 6:07:01 PM.    Final    CT Angio Chest PE W and/or Wo Contrast  Result Date:  04/09/2022 CLINICAL DATA:  Pulmonary embolism (PE) suspected, positive D-dimer; Abdominal pain, acute, nonlocalized EXAM: CT ANGIOGRAPHY CHEST CT ABDOMEN AND PELVIS WITH CONTRAST TECHNIQUE: Multidetector CT imaging of the chest was performed using the standard protocol during bolus administration of intravenous contrast. Multiplanar CT image reconstructions and MIPs were obtained to evaluate the vascular anatomy. Multidetector CT imaging of the abdomen and pelvis was performed using the standard protocol during bolus administration of intravenous contrast. RADIATION DOSE REDUCTION: This exam was performed according to the departmental dose-optimization program which includes automated exposure control, adjustment of the mA and/or kV according to patient size and/or use of iterative reconstruction technique. CONTRAST:  46m OMNIPAQUE IOHEXOL 350 MG/ML SOLN COMPARISON:  July 06, 2021 FINDINGS: CTA CHEST FINDINGS Cardiovascular: Evaluation is limited by respiratory motion. Within these limitations, no evidence of acute pulmonary embolism through the proximal segmental pulmonary arteries. Cardiomegaly. Aortic valve calcifications. Predominately LEFT-sided coronary artery atherosclerotic calcifications. No pericardial effusion. Mediastinum/Nodes: Multiple LEFT-sided thyroid nodules, similar comparison to prior. Dominant nodule with coarse calcifications measures at least 18 mm. No new axillary adenopathy. Soft tissue density of the anterior mediastinum, mildly increased in comparison to prior with representative nodule measuring 10 by 15 mm, previously 5 x 13 mm (series 7, image 124). Lungs/Pleura: No pleural effusion or pneumothorax. Enhancing opacity of the RIGHT lower lobe along the paramediastinal border, most consistent with atelectasis. LEFT upper lobe subpleural pulmonary nodule measures 4 mm, unchanged in comparison to prior (series 7, image 102). LEFT apical 3 mm pulmonary nodule is unchanged in comparison  to prior (series 7, image 49). Musculoskeletal: Osteopenia with heterogeneous appearance of the bone marrow. Advanced degenerative changes of the shoulders.Lipoma of the RIGHT back Review of the MIP images confirms the above findings. CT ABDOMEN and PELVIS FINDINGS Hepatobiliary: Focal fatty deposition adjacent to the falciform ligament. Gallbladder is unremarkable. No intrahepatic or extrahepatic biliary ductal dilation. Portal vein is patent. Pancreas: Unremarkable. No pancreatic ductal dilatation or surrounding inflammatory changes. Spleen: Normal in size without focal abnormality. Adrenals/Urinary Tract: Adrenal glands are unremarkable. No hydronephrosis. Kidneys enhance symmetrically. There is a 22 mm benign LEFT renal cyst (for which no dedicated imaging follow-up is recommended). Dystrophic LEFT-sided cortical calcification. No obstructing nephrolithiasis. There is intraluminal air within the bladder. Stomach/Bowel: No evidence of bowel obstruction. Moderate hiatal hernia. Sigmoid diverticulosis without evidence of acute diverticulitis. Appendix is not visualized; no ancillary evidence of acute appendicitis. Moderate colonic stool burden. Vascular/Lymphatic: Abdominal aorta is normal in course and caliber with mild scattered atherosclerotic calcifications. No new suspicious lymphadenopathy with a similar appearance of mildly prominent RIGHT inguinal lymph nodes. Representative RIGHT inguinal lymph node measures 10 mm in the short axis (series 3, image 77). Reproductive: Status post hysterectomy. No adnexal masses. Other: Small periumbilical fat containing hernia. Musculoskeletal: Osteopenia with heterogeneous appearance of the bone marrow, similar in comparison to prior. Degenerative changes  of the lower lumbar spine. Advanced degenerative changes with bone-on-bone apposition of bilateral hips Review of the MIP images confirms the above findings. IMPRESSION: 1. Intraluminal air within the bladder. Recommend  correlation for any recent instrumentation. 2. No acute pulmonary embolism. 3. Otherwise no CT etiology for abdominal pain is identified. Moderate colonic stool burden diffusely throughout the colon. 4. Mild increase in anterior mediastinal nodular soft tissue. This could reflect underlying thymic hyperplasia. This would be better characterized with dedicated nonemergent chest MRI. 5. Similar appearance of multiple predominately LEFT-sided thyroid nodules. Nonemergent thyroid ultrasound could be considered if not previously performed. 6. Scattered tiny pulmonary nodules measuring up to 4 mm, unchanged in comparison to prior. No follow-up needed if patient is low-risk (and has no known or suspected primary neoplasm). Non-contrast chest CT can be considered in 12 months if patient is high-risk. This recommendation follows the consensus statement: Guidelines for Management of Incidental Pulmonary Nodules Detected on CT Images: From the Fleischner Society 2017; Radiology 2017; 284:228-243. Aortic Atherosclerosis (ICD10-I70.0). Electronically Signed   By: Valentino Saxon M.D.   On: 04/09/2022 10:31   CT ABDOMEN PELVIS W CONTRAST  Result Date: 04/09/2022 CLINICAL DATA:  Pulmonary embolism (PE) suspected, positive D-dimer; Abdominal pain, acute, nonlocalized EXAM: CT ANGIOGRAPHY CHEST CT ABDOMEN AND PELVIS WITH CONTRAST TECHNIQUE: Multidetector CT imaging of the chest was performed using the standard protocol during bolus administration of intravenous contrast. Multiplanar CT image reconstructions and MIPs were obtained to evaluate the vascular anatomy. Multidetector CT imaging of the abdomen and pelvis was performed using the standard protocol during bolus administration of intravenous contrast. RADIATION DOSE REDUCTION: This exam was performed according to the departmental dose-optimization program which includes automated exposure control, adjustment of the mA and/or kV according to patient size and/or use of  iterative reconstruction technique. CONTRAST:  62m OMNIPAQUE IOHEXOL 350 MG/ML SOLN COMPARISON:  July 06, 2021 FINDINGS: CTA CHEST FINDINGS Cardiovascular: Evaluation is limited by respiratory motion. Within these limitations, no evidence of acute pulmonary embolism through the proximal segmental pulmonary arteries. Cardiomegaly. Aortic valve calcifications. Predominately LEFT-sided coronary artery atherosclerotic calcifications. No pericardial effusion. Mediastinum/Nodes: Multiple LEFT-sided thyroid nodules, similar comparison to prior. Dominant nodule with coarse calcifications measures at least 18 mm. No new axillary adenopathy. Soft tissue density of the anterior mediastinum, mildly increased in comparison to prior with representative nodule measuring 10 by 15 mm, previously 5 x 13 mm (series 7, image 124). Lungs/Pleura: No pleural effusion or pneumothorax. Enhancing opacity of the RIGHT lower lobe along the paramediastinal border, most consistent with atelectasis. LEFT upper lobe subpleural pulmonary nodule measures 4 mm, unchanged in comparison to prior (series 7, image 102). LEFT apical 3 mm pulmonary nodule is unchanged in comparison to prior (series 7, image 49). Musculoskeletal: Osteopenia with heterogeneous appearance of the bone marrow. Advanced degenerative changes of the shoulders.Lipoma of the RIGHT back Review of the MIP images confirms the above findings. CT ABDOMEN and PELVIS FINDINGS Hepatobiliary: Focal fatty deposition adjacent to the falciform ligament. Gallbladder is unremarkable. No intrahepatic or extrahepatic biliary ductal dilation. Portal vein is patent. Pancreas: Unremarkable. No pancreatic ductal dilatation or surrounding inflammatory changes. Spleen: Normal in size without focal abnormality. Adrenals/Urinary Tract: Adrenal glands are unremarkable. No hydronephrosis. Kidneys enhance symmetrically. There is a 22 mm benign LEFT renal cyst (for which no dedicated imaging follow-up is  recommended). Dystrophic LEFT-sided cortical calcification. No obstructing nephrolithiasis. There is intraluminal air within the bladder. Stomach/Bowel: No evidence of bowel obstruction. Moderate hiatal hernia. Sigmoid diverticulosis without evidence of  acute diverticulitis. Appendix is not visualized; no ancillary evidence of acute appendicitis. Moderate colonic stool burden. Vascular/Lymphatic: Abdominal aorta is normal in course and caliber with mild scattered atherosclerotic calcifications. No new suspicious lymphadenopathy with a similar appearance of mildly prominent RIGHT inguinal lymph nodes. Representative RIGHT inguinal lymph node measures 10 mm in the short axis (series 3, image 77). Reproductive: Status post hysterectomy. No adnexal masses. Other: Small periumbilical fat containing hernia. Musculoskeletal: Osteopenia with heterogeneous appearance of the bone marrow, similar in comparison to prior. Degenerative changes of the lower lumbar spine. Advanced degenerative changes with bone-on-bone apposition of bilateral hips Review of the MIP images confirms the above findings. IMPRESSION: 1. Intraluminal air within the bladder. Recommend correlation for any recent instrumentation. 2. No acute pulmonary embolism. 3. Otherwise no CT etiology for abdominal pain is identified. Moderate colonic stool burden diffusely throughout the colon. 4. Mild increase in anterior mediastinal nodular soft tissue. This could reflect underlying thymic hyperplasia. This would be better characterized with dedicated nonemergent chest MRI. 5. Similar appearance of multiple predominately LEFT-sided thyroid nodules. Nonemergent thyroid ultrasound could be considered if not previously performed. 6. Scattered tiny pulmonary nodules measuring up to 4 mm, unchanged in comparison to prior. No follow-up needed if patient is low-risk (and has no known or suspected primary neoplasm). Non-contrast chest CT can be considered in 12 months if  patient is high-risk. This recommendation follows the consensus statement: Guidelines for Management of Incidental Pulmonary Nodules Detected on CT Images: From the Fleischner Society 2017; Radiology 2017; 284:228-243. Aortic Atherosclerosis (ICD10-I70.0). Electronically Signed   By: Valentino Saxon M.D.   On: 04/09/2022 10:31   CT HEAD WO CONTRAST (5MM)  Result Date: 04/09/2022 CLINICAL DATA:  87 year old female with history of altered mental status. EXAM: CT HEAD WITHOUT CONTRAST TECHNIQUE: Contiguous axial images were obtained from the base of the skull through the vertex without intravenous contrast. RADIATION DOSE REDUCTION: This exam was performed according to the departmental dose-optimization program which includes automated exposure control, adjustment of the mA and/or kV according to patient size and/or use of iterative reconstruction technique. COMPARISON:  Head CT 04/17/2021. FINDINGS: Brain: Patchy and confluent areas of decreased attenuation are noted throughout the deep and periventricular white matter of the cerebral hemispheres bilaterally, compatible with chronic microvascular ischemic disease. No evidence of acute infarction, hemorrhage, hydrocephalus, extra-axial collection or mass lesion/mass effect. Vascular: No hyperdense vessel or unexpected calcification. Skull: Normal. Negative for fracture or focal lesion. Sinuses/Orbits: No acute finding. Other: None. IMPRESSION: 1. No acute intracranial abnormalities. 2. Chronic microvascular ischemic changes in the cerebral white matter redemonstrated, as above. Electronically Signed   By: Vinnie Langton M.D.   On: 04/09/2022 07:35    EKG: personally reviewed my interpretation is normal rate with sinus rhythm, no ischemic changes noted  Chest Xray - no focal consolidation present, effusion.    CT head without contrast-no acute intracranial abnormalities.  Some chronic microvascular ischemic changes   CTA chest-pulmonary embolism absent.   Scattered tiny pulmonary nodules unchanged from prior non-contrast.  Multiple predominantly left-sided thyroid nodules.   CT abdomen pelvis with contrast -moderate stool burden.  Intraluminal air within the bladder  Assessment/Plan:  Principal Problem:   Cellulitis of right lower extremity Active Problems:   Acute metabolic encephalopathy   Patient Summary MACIL CRADY is a 87 y.o. woman with a history of DM2, TIA, osteoarthritis, acute on chronic CKD 3A, atopic dermatitis, and multiple hospitalizations for UTI's who presented with acute encephalopathy including altered mental status and  syncope and was admitted for right lower extremity cellulitis.  Cellulitis of right lower extremity Patient presented with latered mental status and found to be febrile to 103F in ED. Leukocytosis at 13.6 today. Suspect that source of her fever and acute encephalopathy was right lower extremity with cellulitis. On exam there is edema and warmth on right lower shin to below right knee. Erythema tracks to medial aspect of right lower extremity up to groin. No warmth or swelling in right knee. RLE US DVT negative yesterday. Overall, patient is clinically and hemodynamically stable. Will change patient's antibiotics to cefazolin in the setting of skin infection and continue to monitor for worsening of erythema, warmth, and swelling. - Change vancomycin and cefepime to cefazolin IV 2 g - F/u blood cultures  Syncope Patient had multiple syncopal episodes yesterday per nursing facility and EMS. Head CT unremarkable. EKG normal rate sinus rhythm with no ischemic changes. No cardiac symptoms. Orthostatic BP obtained today were negative. -PT eval/treat  Pyuria, bacteriuria, intraluminal air within bladder Patient has history of recurrent UTI's. She is asymptomatic of urinary symptoms. Intraluminal air on CT could be from in/out catheter. Urine culture growing  s. Aureus and gram negative rods. Low suspicion of  presenting symptoms were from UTI given she has had no urinary issues or complaints.  -Continue to monitor for signs of UTI  Hx of Atopic Dermatitis Patient reports itching and dryness on her upper extremities. Patient reports she had dermatology appointment later this week that has been rescheduled to 10/11 due to this hospitalization. Patient's niece reports that patient will get itch in her legs and will sometimes scratch her legs with her toenails - this could be a potential source of her recurrent cellulitis. - Start patient on triamcinolone cream BID - Start patient on Sarna PRN  Acute hypoxic respiratory failure Patient required supplemental O2 in ED. Has been on room air since admission with no respiratory distress. CXR and CTA chest no focal opacity or effusions. -Continue to monitor, O2 goal of 90-95%.  Hx of type 2 diabetes mellitus A1c today 6.7%, 4 months ago was 6.4%. Home regimen is metformin. CBG overnight stable, fasting glucose 100 this AM. -Continue SSI  CKD 3a Hyponatremia (resolving) Baseline Cr around 1.2 prior to admission. Cr 1.1 this morning. Hyponatremia likely hypovolemic in setting of acute illness. Na 134 this morning, corrected 134-135. -daily BMP  Acute encephalopathy secondary to cellulitis (resolved) Patient presented with alter mental status on presentation in the ED, but her mental status improved back to baseline on admission examination. Today on exam, she is alert and oriented x3 and able to answer all questions appropriately. - Continue to monitor mental status - Hold gabapentin and melatonin  Hypertension Overnight, BP's were 118-176/58-128. -hold hydralazine in setting of acute illness  Hx of TIA No acute process on CT head -Continue home aspirin 81 mg   Anemia of chronic disease Hgb 9.2, MCV 98 this morning. Baseline 9-10. Iron studies on 5/23 iron of 32, sat of 19%. -Continue to monitor outpatient    Hx of Constipation -Continue daily  home Miralax and senna    Diet: Carb-modified VTE: Enoxaparin IVF: N/A Code: DNR   Prior to Admission Living Arrangement: [facility] Anticipated Discharge Location: [facility] Barriers to Discharge: IV antibiotics Dispo: Anticipated discharge in approximately 2 day(s).    LOS: 0 days   Chandani Rogowski (Max) [place], MS3 Pager Number: [phone] 04/10/2022, 8:00 AM

## 2022-04-10 NOTE — Evaluation (Addendum)
Physical Therapy Evaluation Patient Details Name: Penny [name] MRN: [id number] DOB: [birth year] Today's Date: 04/10/2022  History of Present Illness  Pt is 87 yo female admitted on 04/09/22 with syncopal episodes, metabolic encephalopathy, and R LE cellulitis.  Pt with hx of DM2, TIA, OA, CKD, atopic dermatitis.   Clinical Impression  Pt admitted with above diagnosis. Pt from [facility] term [facility] at nursing home but fairly mobile per pt and note from MD reports RN at facility stating pt fairly active.  Pt reports independent with transfers and ADLs with ambulation using RW - do question accuracy of independence due to limited knee flexion and level of assist required today.  Pt has limited knee flexion with tight endfeel and generalized weakness.  She required mod A with bed mobility and to stand with tendency to posterior lean.  Once on her feet, pt min A with RW to take a few steps.  Pt below reported baseline and will benefit from acute PT services.  Pt currently with functional limitations due to the deficits listed below (see PT Problem List). Pt will benefit from skilled PT to increase their independence and safety with mobility to allow discharge to the venue listed below.   Orthostatic BP were negative.  Orthostatic BP: Supine 132/80 Sitting 140/76 Standing 121/60        Recommendations for follow up therapy are one component of a multi-disciplinary discharge planning process, led by the attending physician.  Recommendations may be updated based on patient status, additional functional criteria and insurance authorization.  Follow Up Recommendations Skilled nursing-short term rehab (<3 hours/day) Can patient physically be transported by private vehicle: No    Assistance Recommended at Discharge Frequent or constant Supervision/Assistance  Patient can return home with the following  A little help with walking and/or transfers;A little help with bathing/dressing/bathroom;Assistance with  cooking/housework;Help with stairs or ramp for entrance    Equipment Recommendations None recommended by PT  Recommendations for Other Services       Functional Status Assessment Patient has had a recent decline in their functional status and demonstrates the ability to make significant improvements in function in a reasonable and predictable amount of time.     Precautions / Restrictions Precautions Precautions: Fall      Mobility  Bed Mobility Overal bed mobility: Needs Assistance Bed Mobility: Supine to Sit     Supine to sit: Mod assist     General bed mobility comments: Increased time, cues for sequencing then mod A to lift trunk and scoot forward    Transfers Overall transfer level: Needs assistance Equipment used: Rolling walker (2 wheels) Transfers: Sit to/from Stand, Bed to chair/wheelchair/BSC Sit to Stand: Mod assist, From elevated surface   Step pivot transfers: Min assist       General transfer comment: Pt with limited knee flexion and unable to get feet under body for efficient standing.  Required mod A from elevated bed and bsc with heavy use of UE and once partially up pt walks feet under body.  Similarly, when sitting she reaches back to support self on chair and walks feet out due to decreased knee flexion.  Once standing, min A to steady during step pivot    Ambulation/Gait Ambulation/Gait assistance: Min assist Gait Distance (Feet): 2 Feet Assistive device: Rolling walker (2 wheels) Gait Pattern/deviations: Step-to pattern, Decreased stride length Gait velocity: decreased     General Gait Details: Small steps to chair with RW and cues  Stairs  Wheelchair Mobility    Modified Rankin (Stroke Patients Only)       Balance Overall balance assessment: Needs assistance Sitting-balance support: Bilateral upper extremity supported Sitting balance-Leahy Scale: Poor Sitting balance - Comments: Requiring UE support and tending to  lean posteriorly.  Initially mod A progressing to min guard   Standing balance support: Bilateral upper extremity supported, Reliant on assistive device for balance Standing balance-Leahy Scale: Poor Standing balance comment: RW and min guard/min A                             Pertinent Vitals/Pain Pain Assessment Pain Assessment: No/denies pain    Home Living Family/patient expects to be discharged to:: [place]: Conservation officer, nature (2 wheels);Wheelchair - manual Additional Comments: Pt from [facility] term [facility] facility - [facility]    Prior Function Prior Level of Function : Independent/Modified Independent             Mobility Comments: Somewhat questionable historian.  Pt reporting failry independent and ambulatory with RW, but today had decreased knee flexion with tight end feel requiring mod A to stand - question if pt able to do on her own at facility.  Per H and P MD note - pt alert and oriented x 4 and fairly active baseline per RN at facility.  Pt reports using RW and can mobilize in room but also in hallway some. She also uses a w/c for longer distances ADLs Comments: Reports independent toileting, sponge baths, dressing but staff assist with showers     Hand Dominance   Dominant Hand: Right    Extremity/Trunk Assessment   Upper Extremity Assessment Upper Extremity Assessment: Generalized weakness    Lower Extremity Assessment Lower Extremity Assessment: LLE deficits/detail;RLE deficits/detail RLE Deficits / Details: ROM: ankle and hip WFL, knee ext 0, knee flexion limited to ~60 degrees tight endfeel; MMT: ankle 5/5, knee and hip 4/5 LLE Deficits / Details: ROM: ankle and hip WFL, knee ext 0, knee flexion limited to ~60 degrees tight endfeel; MMT: ankle 5/5, knee and hip 4/5    Cervical / Trunk Assessment Cervical / Trunk Assessment: Normal  Communication   Communication: No difficulties   Cognition Arousal/Alertness: Awake/alert Behavior During Therapy: WFL for tasks assessed/performed Overall Cognitive Status: No family/caregiver present to determine baseline cognitive functioning Area of Impairment: Orientation                 Orientation Level: Situation             General Comments: A and O x 3; followed basic commands; answered questions appropriatley        General Comments      Exercises     Assessment/Plan    PT Assessment Patient needs continued PT services  PT Problem List Decreased mobility;Decreased strength;Decreased safety awareness;Decreased range of motion;Decreased activity tolerance;Decreased balance;Decreased knowledge of use of DME;Decreased cognition       PT Treatment Interventions DME instruction;Therapeutic activities;Gait training;Therapeutic exercise;Patient/family education;Balance training;Functional mobility training    PT Goals (Current goals can be found in the Care Plan section)  Acute Rehab PT Goals Patient Stated Goal: return to nursing home PT Goal Formulation: With patient Time For Goal Achievement: 04/24/22 Potential to Achieve Goals: Good    Frequency Min 2X/week     Co-evaluation  AM-PAC PT "6 Clicks" Mobility  Outcome Measure Help needed turning from your back to your side while in a flat bed without using bedrails?: A Little Help needed moving from lying on your back to sitting on the side of a flat bed without using bedrails?: A Lot Help needed moving to and from a bed to a chair (including a wheelchair)?: A Lot Help needed standing up from a chair using your arms (e.g., wheelchair or bedside chair)?: A Lot Help needed to walk in hospital room?: A Lot Help needed climbing 3-5 steps with a railing? : Total 6 Click Score: 12    End of Session Equipment Utilized During Treatment: Gait belt Activity Tolerance: Patient tolerated treatment well Patient left: with chair alarm set;in  chair;with call bell/phone within reach Nurse Communication: Mobility status;Other (comment) (Notified RN&tech: Bed saturated at arrival; PT did not strip bed as no linen bag&pt on contact, used BSC small hard BM; needs new purewick; recommend assist of 2 for transfers (mod A RW); orthostatic negative) PT Visit Diagnosis: Other abnormalities of gait and mobility (R26.89);Muscle weakness (generalized) (M62.81)    Time: 6004-5997 PT Time Calculation (min) (ACUTE ONLY): 37 min   Charges:   PT Evaluation $PT Eval Moderate Complexity: 1 Mod PT Treatments $Therapeutic Activity: 8-22 mins        Abran Richard, PT Acute Rehab [facility] [phone]   Penny [name] 04/10/2022, 1:53 PM

## 2022-04-11 DIAGNOSIS — N1831 Chronic kidney disease, stage 3a: Secondary | ICD-10-CM | POA: Diagnosis present

## 2022-04-11 DIAGNOSIS — G9341 Metabolic encephalopathy: Secondary | ICD-10-CM | POA: Diagnosis present

## 2022-04-11 DIAGNOSIS — J9601 Acute respiratory failure with hypoxia: Secondary | ICD-10-CM | POA: Diagnosis present

## 2022-04-11 DIAGNOSIS — E1122 Type 2 diabetes mellitus with diabetic chronic kidney disease: Secondary | ICD-10-CM | POA: Diagnosis present

## 2022-04-11 DIAGNOSIS — A419 Sepsis, unspecified organism: Secondary | ICD-10-CM

## 2022-04-11 DIAGNOSIS — Z8673 Personal history of transient ischemic attack (TIA), and cerebral infarction without residual deficits: Secondary | ICD-10-CM | POA: Diagnosis not present

## 2022-04-11 DIAGNOSIS — Z79899 Other long term (current) drug therapy: Secondary | ICD-10-CM | POA: Diagnosis not present

## 2022-04-11 DIAGNOSIS — J9691 Respiratory failure, unspecified with hypoxia: Secondary | ICD-10-CM | POA: Diagnosis not present

## 2022-04-11 DIAGNOSIS — L03115 Cellulitis of right lower limb: Secondary | ICD-10-CM | POA: Diagnosis present

## 2022-04-11 DIAGNOSIS — Z20822 Contact with and (suspected) exposure to covid-19: Secondary | ICD-10-CM | POA: Diagnosis present

## 2022-04-11 DIAGNOSIS — Z7984 Long term (current) use of oral hypoglycemic drugs: Secondary | ICD-10-CM | POA: Diagnosis not present

## 2022-04-11 DIAGNOSIS — L0390 Cellulitis, unspecified: Secondary | ICD-10-CM | POA: Diagnosis not present

## 2022-04-11 DIAGNOSIS — R6520 Severe sepsis without septic shock: Secondary | ICD-10-CM | POA: Diagnosis present

## 2022-04-11 DIAGNOSIS — Z87891 Personal history of nicotine dependence: Secondary | ICD-10-CM | POA: Diagnosis not present

## 2022-04-11 DIAGNOSIS — K5900 Constipation, unspecified: Secondary | ICD-10-CM | POA: Diagnosis present

## 2022-04-11 DIAGNOSIS — R4182 Altered mental status, unspecified: Secondary | ICD-10-CM | POA: Diagnosis not present

## 2022-04-11 DIAGNOSIS — R509 Fever, unspecified: Secondary | ICD-10-CM | POA: Diagnosis present

## 2022-04-11 DIAGNOSIS — E861 Hypovolemia: Secondary | ICD-10-CM | POA: Diagnosis present

## 2022-04-11 DIAGNOSIS — I129 Hypertensive chronic kidney disease with stage 1 through stage 4 chronic kidney disease, or unspecified chronic kidney disease: Secondary | ICD-10-CM | POA: Diagnosis present

## 2022-04-11 DIAGNOSIS — L03116 Cellulitis of left lower limb: Secondary | ICD-10-CM | POA: Diagnosis present

## 2022-04-11 DIAGNOSIS — Z7982 Long term (current) use of aspirin: Secondary | ICD-10-CM | POA: Diagnosis not present

## 2022-04-11 DIAGNOSIS — G9340 Encephalopathy, unspecified: Secondary | ICD-10-CM | POA: Diagnosis not present

## 2022-04-11 DIAGNOSIS — E871 Hypo-osmolality and hyponatremia: Secondary | ICD-10-CM | POA: Diagnosis present

## 2022-04-11 DIAGNOSIS — L209 Atopic dermatitis, unspecified: Secondary | ICD-10-CM | POA: Diagnosis present

## 2022-04-11 DIAGNOSIS — D631 Anemia in chronic kidney disease: Secondary | ICD-10-CM | POA: Diagnosis present

## 2022-04-11 DIAGNOSIS — Z66 Do not resuscitate: Secondary | ICD-10-CM | POA: Diagnosis present

## 2022-04-11 LAB — BASIC METABOLIC PANEL
Anion gap: 6 (ref 5–15)
BUN: 25 mg/dL — ABNORMAL HIGH (ref 8–23)
CO2: 27 mmol/L (ref 22–32)
Calcium: 8.5 mg/dL — ABNORMAL LOW (ref 8.9–10.3)
Chloride: 102 mmol/L (ref 98–111)
Creatinine, Ser: 1.02 mg/dL — ABNORMAL HIGH (ref 0.44–1.00)
GFR, Estimated: 54 mL/min — ABNORMAL LOW (ref >60)
Glucose, Bld: 121 mg/dL — ABNORMAL HIGH (ref 70–99)
Potassium: 4 mmol/L (ref 3.5–5.1)
Sodium: 135 mmol/L (ref 135–145)

## 2022-04-11 LAB — GLUCOSE, CAPILLARY
Glucose-Capillary: 126 mg/dL — ABNORMAL HIGH (ref 70–99)
Glucose-Capillary: 160 mg/dL — ABNORMAL HIGH (ref 70–99)
Glucose-Capillary: 144 mg/dL — ABNORMAL HIGH (ref 70–99)

## 2022-04-11 MED ORDER — MUPIROCIN 2 % EX OINT
1.0000 | TOPICAL_OINTMENT | Freq: Two times a day (BID) | CUTANEOUS | Status: DC
  Administered 2022-04-11: 1 via NASAL
  Filled 2022-04-11: qty 22

## 2022-04-11 MED ORDER — CHLORHEXIDINE GLUCONATE CLOTH 2 % EX PADS
6.0000 | MEDICATED_PAD | Freq: Every day | CUTANEOUS | Status: DC
  Administered 2022-04-11 – 2022-04-13 (×3): 6 via TOPICAL

## 2022-04-11 MED ORDER — CEPHALEXIN 500 MG PO CAPS
500.0000 mg | ORAL_CAPSULE | Freq: Two times a day (BID) | ORAL | Status: DC
  Administered 2022-04-11 – 2022-04-13 (×5): 500 mg via ORAL
  Filled 2022-04-11 (×5): qty 1

## 2022-04-11 NOTE — Progress Notes (Signed)
Mobility Specialist Criteria Algorithm Info.   04/11/22 1100  Mobility  Activity Transferred from bed to chair  Range of Motion/Exercises Active;All extremities  Level of Assistance Moderate assist, patient does 50-74%  Assistive Device Front wheel walker  Distance Ambulated (ft) 2 ft  Activity Response Tolerated well   Patient received in supine reluctant to participate in mobility, eventually agreeable with max encouragement. Required mod-max A to EOB from supine to sit. Stood with mod A + cues for hand placement, upon standing pt with heavy posterior lean that improved with minimal assistance. Pt then required min A to take steps to recliner. Tolerated without incident but c/o pain in LLE. Was left in recliner with all needs met, call bell in reach.   [place] Morrigan [place], [place], [place]  KVQQV:[phone] Office: [phone]

## 2022-04-11 NOTE — Progress Notes (Addendum)
Patients niece would like to be contacted before patient is discharge to discuss plan of care at [phone].

## 2022-04-11 NOTE — Plan of Care (Signed)
  Problem: Education: Goal: Ability to describe self-care measures that may prevent or decrease complications (Diabetes Survival Skills Education) will improve Outcome: Progressing   Problem: Coping: Goal: Ability to adjust to condition or change in health will improve Outcome: Progressing   Problem: Education: Goal: Knowledge of General Education information will improve Description: Including pain rating scale, medication(s)/side effects and non-pharmacologic comfort measures Outcome: Progressing

## 2022-04-11 NOTE — Progress Notes (Addendum)
Subjective:  Overnight Events: No acute events or concerns overnight.  Patient evaluated at bedside this AM. She reports the steroid cream has been helpful for her atopic dermatitis.  Objective:  Vital signs in last 24 hours: Vitals:   04/10/22 1526 04/10/22 2043 04/11/22 0500 04/11/22 0719  BP: (!) 164/67 (!) 158/89 (!) 148/76 (!) 153/72  Pulse: (!) 101 (!) 59 62 64  Resp: '17 18 18 16  '$ Temp: (!) 97.5 F (36.4 C) 98.6 F (37 C) 98.7 F (37.1 C) 98.8 F (37.1 C)  TempSrc: Oral Oral Oral Oral  SpO2: 99% 97% 96% 93%  Weight:       Weight change:   Intake/Output Summary (Last 24 hours) at 04/11/2022 2637 Last data filed at 04/11/2022 0300 Gross per 24 hour  Intake 200 ml  Output 925 ml  Net -725 ml    Physical Exam   Constitutional: pleasant, well appearing and sitting in bed, in no acute distress Cardiovascular: warm and well perfused. Pedal pulses 2+ bilaterally. Pulmonary/Chest: normal work of breathing on room air MSK: normal bulk and tone, no left lower extremity edema. Skin: warm and dry. Right lower shin to below right knee with edema and warmth - edema and warmth improving from yesterday. Erythema tracks to medial aspect of shin up towards groin. No warmth or swelling in right knee Neurological: alert and answering questions appropriately. Psych: appropriate mood and affect   Labs: CBC    Component Value Date/Time   WBC 13.6 (H) 04/10/2022 0021   RBC 3.00 (L) 04/10/2022 0021   HGB 9.2 (L) 04/10/2022 0021   HCT 29.4 (L) 04/10/2022 0021   PLT 161 04/10/2022 0021   MCV 98.0 04/10/2022 0021   MCH 30.7 04/10/2022 0021   MCHC 31.3 04/10/2022 0021   RDW 13.2 04/10/2022 0021   LYMPHSABS 0.5 (L) 04/10/2022 0021   MONOABS 0.8 04/10/2022 0021   EOSABS 0.7 (H) 04/10/2022 0021   BASOSABS 0.0 04/10/2022 0021     CMP     Component Value Date/Time   NA 135 04/11/2022 0355   K 4.0 04/11/2022 0355   CL 102 04/11/2022 0355   CO2 27 04/11/2022 0355   GLUCOSE 121  (H) 04/11/2022 0355   BUN 25 (H) 04/11/2022 0355   CREATININE 1.02 (H) 04/11/2022 0355   CALCIUM 8.5 (L) 04/11/2022 0355   PROT 8.0 04/09/2022 0013   ALBUMIN 2.4 (L) 04/09/2022 0013   AST 30 04/09/2022 0013   ALT 30 04/09/2022 0013   ALKPHOS 69 04/09/2022 0013   BILITOT 1.0 04/09/2022 0013   GFRNONAA 54 (L) 04/11/2022 0355    Assessment/Plan:  Principal Problem:   Sepsis due to cellulitis ([place]) Active Problems:   Cellulitis of right lower extremity   Acute metabolic encephalopathy   Fever, unspecified   Patient Summary Penny [name] is a 87 y.o. woman with a history of DM2, TIA, osteoarthritis, acute on chronic CKD 3A, atopic dermatitis, and multiple hospitalizations for UTI's who presented with acute encephalopathy in the setting of sepsis and was admitted for right lower extremity cellulitis.   Cellulitis of right lower extremity Patient presented with sepsis secondary to RLE cellulitis. On exam today the edema on right lower shin to below right knee is retracting and is less warm than yesterday. Still some erythema tracking to medial aspect of right lower extremity up to groin. No warmth or swelling in right knee. Overall, patient is clinically and hemodynamically stable. Will change patient's antibiotics from IV to oral and  continue to monitor for worsening of erythema, warmth, and swelling. She is medically stable and ready for discharge pending placement back to [facility] from cefazolin IV 2 g to Keflex 500 mg BID -F/u blood cultures   Syncope Patient had multiple syncopal episodes on presentation per nursing facility and EMS. Orthostatic BP obtained yesterday were negative. Patient has had no further syncope on admission and is able to ambulate with some assistance. -PT recommends skilled nursing-short term rehab   Hx of Atopic Dermatitis Patient reports triamcinolone has helped with bilateral upper arm atopic dermatitis. Patient's niece reports  patient will have dermatology appointment on 10/11. - Continue patient on triamcinolone cream BID - Continue patient on Sarna PRN   Hx of type 2 diabetes mellitus A1c yesterday 6.7%, 4 months ago was 6.4%. Home regimen is metformin. CBG overnight stable at 126-159, fasting glucose 126 this AM. -D/C SSI given her glucose has been stable and A1C at 6.7%   CKD 3a Presented with 1.3 Cr. Baseline Cr around 1.2 prior to admission. Cr 1.02 this morning. Na 135 this morning. -CTM  Pyuria, bacteriuria, intraluminal air within bladder Patient has history of recurrent UTI's. Urine culture unremarkable level of colonies of s. Aureus and gram negative rods. Low suspicion of UTI given she has had no urinary issues or complaints.  -Continue to monitor for signs of UTI  Hx of TIA No acute process on CT head -Continue home aspirin 81 mg   Hypertension Overnight, BP's were 148-158/72-89. -hold hydralazine in setting of acute illness   Diet: Carb-modified VTE: Enoxaparin IVF: N/A Code: DNR   Prior to Admission Living Arrangement: [facility] Anticipated Discharge Location: [facility] Barriers to Discharge: Insurance authorization to return to SNF Dispo: Anticipated discharge in approximately 1 day(s).    LOS: 0 days   Penny [name], MS3 Pager Number: [phone] 04/11/2022, 7:23 AM

## 2022-04-11 NOTE — TOC Initial Note (Signed)
Transition of Care [facility]) - Initial/Assessment Note    Patient Details  Name: Penny [name] MRN: [id number] Date of Birth: 03/24/1935  Transition of Care [facility]) CM/SW Contact:    Tresa Endo Phone Number: 04/11/2022, 10:00 AM  Clinical Narrative:                 Patient is from [facility] (LTC), CSW attempted to contact patient and her niece to confirm DC location. No answer CSW left a VM.   CSW spoke with Juliann Pulse at [facility], she confirmed that pt is a LTC resident and is able to DC when medically stable. PT has recommended PT/OT at SNF. Patient is now medically stable but will need insurance auth before returning.   CSW started auth, no ref# provided, CSW will continue to follow for possible DC today.  Expected Discharge Plan: Skilled Nursing Facility Barriers to Discharge: No Barriers Identified   Patient Goals and CMS Choice Patient states their goals for this hospitalization and ongoing recovery are:: To return to [place] Medicare.gov Compare Post Acute Care list provided to:: Patient Choice offered to / list presented to : Patient  Expected Discharge Plan and Services Expected Discharge Plan: [place] In-house Referral: Clinical Social Work   Post Acute Care Choice: [place] Living arrangements for the past 2 months: [place]                                      Prior Living Arrangements/Services Living arrangements for the past 2 months: [place] Lives with:: Facility Resident Patient language and need for interpreter reviewed:: Yes Do you feel safe going back to the place where you live?: Yes      Need for Family Participation in Patient Care: Yes (Comment) Care giver support system in place?: Yes (comment)   Criminal Activity/Legal Involvement Pertinent to Current Situation/Hospitalization: No - Comment as needed  Activities of Daily Living      Permission  Sought/Granted Permission sought to share information with : Family Supports, Customer service manager Permission granted to share information with : Yes, Verbal Permission Granted  Share Information with NAME: [name] (Niece)   [phone]  Permission granted to share info w AGENCY: [place] granted to share info w Relationship: [name] (Niece)   [phone]  Permission granted to share info w Contact Information: [name] (Niece)   [phone]  Emotional Assessment   Attitude/Demeanor/Rapport: Unable to Assess Affect (typically observed): Unable to Assess Orientation: : Oriented to Self, Oriented to Place, Oriented to  Time, Oriented to Situation Alcohol / Substance Use: Not Applicable Psych Involvement: No (comment)  Admission diagnosis:  Fever, unspecified [R50.9] Urinary tract infection without hematuria, site unspecified [N39.0] Altered mental status, unspecified altered mental status type [R41.82] Syncope, unspecified syncope type [S01] Acute metabolic encephalopathy [U93.23] Patient Active Problem List   Diagnosis Date Noted   Fever, unspecified    Acute metabolic encephalopathy 55/73/2202   Sepsis due to cellulitis ([place]) 11/24/2021   HTN (hypertension) 11/24/2021   Pressure injury of skin 07/11/2021   Thyroid nodule 07/11/2021   Cellulitis of right lower extremity 07/09/2021   AKI (acute kidney injury) ([place]) 07/09/2021   Anemia due to chronic kidney disease 07/09/2021   Edema of left lower extremity 07/06/2021   Pulmonary nodule 1 cm or greater in diameter 07/06/2021   Condyloma acuminatum 07/06/2021  Protein-calorie malnutrition, severe 05/11/2021   Acute prerenal azotemia 05/09/2021   Acute on chronic anemia 05/09/2021   Senile dementia without behavioral disturbance (HCC)    Pressure injury of right hip, stage 4 (HCC)    Complicated open wound of right hip 05/08/2021   Malnutrition of moderate degree  04/21/2021   Hypothermia due to exposure 04/17/2021   Pressure ulcer, shoulder blades, stage II ([place]) - right posterior shoulder 04/17/2021   Pressure ulcer, hip, right, unstageable ([place]) 04/17/2021   Acute renal failure superimposed on stage 3a chronic kidney disease ([place]) 04/17/2021   Pain due to onychomycosis of toenails of both feet 02/01/2021   Type 2 diabetes mellitus with vascular disease ([place]) 02/01/2021   PCP:  Garwin Brothers, MD Pharmacy:  No Pharmacies Listed    Social Determinants of Health (SDOH) Interventions    Readmission Risk Interventions     No data to display

## 2022-04-11 NOTE — Care Management (Signed)
  Transition of Care (TOC) Screening Note   Patient Details  Name: Penny [name] Date of Birth: 1935-04-02   Transition of Care [facility]) CM/SW Contact:    Carles Collet, RN Phone Number: 04/11/2022, 7:45 AM    Transition of Care Department [facility]) has reviewed patient and we will continue to monitor patient advancement through interdisciplinary progression rounds.   Patient admitted from [facility] and Rehab

## 2022-04-11 NOTE — [place] FL2 (Signed)
[place] MEDICAID FL2 LEVEL OF CARE SCREENING TOOL     IDENTIFICATION  Patient Name: Penny [name] Birthdate: 06/16/1935 Sex: female Admission Date (Current Location): 04/09/2022  [place] and [place] Number:  Penny [name] [id number] [place] and Address:  [facility], [place] [address], [place], [place] [address]      Provider Number: [id number]  Attending Physician Name and Address:  Lucious Groves, DO  Relative Name and Phone Number:  [name] (Niece)   [phone]    Current Level of Care: Hospital Recommended Level of Care:   Prior Approval Number:    Date Approved/Denied:   PASRR Number:    Discharge Plan: SNF    Current Diagnoses: Patient Active Problem List   Diagnosis Date Noted   Fever, unspecified    Acute metabolic encephalopathy 16/07/930   Sepsis due to cellulitis ([place]) 11/24/2021   HTN (hypertension) 11/24/2021   Pressure injury of skin 07/11/2021   Thyroid nodule 07/11/2021   Cellulitis of right lower extremity 07/09/2021   AKI (acute kidney injury) ([place]) 07/09/2021   Anemia due to chronic kidney disease 07/09/2021   Edema of left lower extremity 07/06/2021   Pulmonary nodule 1 cm or greater in diameter 07/06/2021   Condyloma acuminatum 07/06/2021   Protein-calorie malnutrition, severe 05/11/2021   Acute prerenal azotemia 05/09/2021   Acute on chronic anemia 05/09/2021   Senile dementia without behavioral disturbance (HCC)    Pressure injury of right hip, stage 4 ([place])    Complicated open wound of right hip 05/08/2021   Malnutrition of moderate degree 04/21/2021   Hypothermia due to exposure 04/17/2021   Pressure ulcer, shoulder blades, stage II ([place]) - right posterior shoulder 04/17/2021   Pressure ulcer, hip, right, unstageable ([place]) 04/17/2021   Acute renal failure superimposed on stage 3a chronic kidney disease ([place]) 04/17/2021   Pain due to onychomycosis of toenails of both feet 02/01/2021   Type 2 diabetes  mellitus with vascular disease ([place]) 02/01/2021    Orientation RESPIRATION BLADDER Height & Weight     Self, Time, Situation, Place  Normal Incontinent, External catheter Weight: 138 lb 14.2 oz (63 kg) Height:     BEHAVIORAL SYMPTOMS/MOOD NEUROLOGICAL BOWEL NUTRITION STATUS      Continent Diet (See DC Summary)  AMBULATORY STATUS COMMUNICATION OF NEEDS Skin   Limited Assist Verbally Normal                       Personal Care Assistance Level of Assistance  Bathing, Feeding, Dressing Bathing Assistance: Limited assistance Feeding assistance: Independent Dressing Assistance: Limited assistance     Functional Limitations Info  Sight, Hearing, Speech Sight Info: Adequate Hearing Info: Adequate Speech Info: Adequate    SPECIAL CARE FACTORS FREQUENCY  PT (By licensed PT), OT (By licensed OT)     PT Frequency: 5x a week OT Frequency: 3x a week            Contractures Contractures Info: Not present    Additional Factors Info  Code Status, Allergies Code Status Info: DNR Allergies Info: Glipizide           Current Medications (04/11/2022):  This is the current hospital active medication list Current Facility-Administered Medications  Medication Dose Route Frequency Provider Last Rate Last Admin   acetaminophen (TYLENOL) tablet 650 mg  650 mg Oral Q6H PRN Katsadouros, Vasilios, MD       Or   acetaminophen (TYLENOL) suppository 650 mg  650 mg Rectal Q6H PRN Katsadouros,  Vasilios, MD       aspirin chewable tablet 81 mg  81 mg Oral Daily Katsadouros, Vasilios, MD   81 mg at 04/11/22 0312   camphor-menthol (SARNA) lotion   Topical PRN Romana Juniper, MD       cephALEXin (KEFLEX) capsule 500 mg  500 mg Oral Q12H Gaylan Gerold, DO       Chlorhexidine Gluconate Cloth 2 % PADS 6 each  6 each Topical Q0600 Joni Reining C, DO       enoxaparin (LOVENOX) injection 40 mg  40 mg Subcutaneous Q24H Joni Reining C, DO   40 mg at 04/10/22 2130   hydrOXYzine (ATARAX) tablet  25 mg  25 mg Oral Q8H PRN Romana Juniper, MD   25 mg at 04/10/22 2129   insulin aspart (novoLOG) injection 0-9 Units  0-9 Units Subcutaneous TID WC Katsadouros, Vasilios, MD   1 Units at 04/11/22 0909   mupirocin ointment (BACTROBAN) 2 % 1 Application  1 Application Nasal BID Joni Reining C, DO   1 Application at 81/18/86 0909   polyethylene glycol (MIRALAX / GLYCOLAX) packet 17 g  17 g Oral Daily Katsadouros, Vasilios, MD   17 g at 04/11/22 7737   senna (SENOKOT) tablet 8.6 mg  1 tablet Oral Daily Katsadouros, Vasilios, MD   8.6 mg at 04/11/22 0909   triamcinolone 0.1 % cream : eucerin cream, 1:1   Topical BID Gaylan Gerold, DO   Given at 04/11/22 3668     Discharge Medications: Please see discharge summary for a list of discharge medications.  Relevant Imaging Results:  Relevant Lab Results:   Additional Information SSN [id number]  Reece Agar, LCSWA

## 2022-04-12 DIAGNOSIS — R4182 Altered mental status, unspecified: Secondary | ICD-10-CM | POA: Diagnosis not present

## 2022-04-12 DIAGNOSIS — L0390 Cellulitis, unspecified: Secondary | ICD-10-CM

## 2022-04-12 DIAGNOSIS — A419 Sepsis, unspecified organism: Secondary | ICD-10-CM | POA: Diagnosis not present

## 2022-04-12 LAB — URINE CULTURE: Culture: 10000 — AB

## 2022-04-12 LAB — GLUCOSE, CAPILLARY: Glucose-Capillary: 99 mg/dL (ref 70–99)

## 2022-04-12 MED ORDER — CHLORHEXIDINE GLUCONATE CLOTH 2 % EX PADS
6.0000 | MEDICATED_PAD | Freq: Every day | CUTANEOUS | Status: DC
  Administered 2022-04-12 – 2022-04-13 (×2): 6 via TOPICAL

## 2022-04-12 MED ORDER — BISACODYL 10 MG RE SUPP
10.0000 mg | Freq: Once | RECTAL | Status: AC
  Administered 2022-04-12: 10 mg via RECTAL
  Filled 2022-04-12: qty 1

## 2022-04-12 MED ORDER — MUPIROCIN 2 % EX OINT
1.0000 | TOPICAL_OINTMENT | Freq: Two times a day (BID) | CUTANEOUS | Status: DC
  Administered 2022-04-12 – 2022-04-13 (×3): 1 via NASAL

## 2022-04-12 MED ORDER — HYDRALAZINE HCL 25 MG PO TABS
25.0000 mg | ORAL_TABLET | Freq: Three times a day (TID) | ORAL | Status: DC
  Administered 2022-04-12 – 2022-04-13 (×4): 25 mg via ORAL
  Filled 2022-04-12 (×4): qty 1

## 2022-04-12 NOTE — Discharge Instructions (Addendum)
Ms. Rodriges, you were hospitalized for an infection of the skin (cellulitis) in your right leg that led to some confusion and fainting when you presented to the emergency department. Your symptoms improved with rest and fluids, and your skin infection improved with antibiotics.   As you will participate in rehab at your skilled nursing facility, we will be sending you with 3 more days of your current oral antibiotics course.   Please follow-up in 2 weeks with your PCP or dermatologist for your skin infection and atopic dermatitis.   Please note these changes made to your medications:    Please START taking:    Keflex capsule 500 mg 2 times a day for your right leg skin infection Tramcinolone cream two times daily for your atopic dermatitis on your arms   If you experience worsening of your skin infection including more swelling, redness, or warmth in the leg or if you experience fever and confusion, please return to the emergency department for evaluation.    It was a pleasure taking care of you, thank you for allowing [place] to be part of your care.    Max, Dr. Eloisa Northern, and Dr. Johnney Ou

## 2022-04-12 NOTE — Progress Notes (Signed)
Subjective:  Overnight Events: No acute events or concerns overnight.  Patient evaluated at bedside this AM. She reports that her right leg feels better although still experiencing some tenderness with palpation and ambulation. No fevers or chills. Has not had a BM in the hospital so far.  Objective:  Vital signs in last 24 hours: Vitals:   04/11/22 0719 04/11/22 2012 04/12/22 0422 04/12/22 0742  BP: (!) 153/72 (!) 177/83 (!) 169/69 133/61  Pulse: 64 61 60 (!) 52  Resp: '16 16 16 17  '$ Temp: 98.8 F (37.1 C) 98 F (36.7 C) 98.5 F (36.9 C)   TempSrc: Oral Oral Oral   SpO2: 93% 100% 99% 94%  Weight:   66.4 kg    Weight change: 3.4 kg  Intake/Output Summary (Last 24 hours) at 04/12/2022 1317 Last data filed at 04/12/2022 0426 Gross per 24 hour  Intake 180 ml  Output 850 ml  Net -670 ml    Physical Exam   Constitutional: pleasant, well appearing and sitting in bed, in no acute distress Cardiovascular: warm and well perfused. Pulmonary/Chest: normal work of breathing on room air Abdominal: soft, non-tender, non-distended, bowel sounds present MSK: normal bulk and tone, no left lower extremity edema. Skin: warm and dry. Right lower shin to below right knee with edema and warmth. Edema and redness improving from yesterday. Erythema that tracks to medial aspect of shin up towards groin improving. No warmth or swelling in right knee. RLE tender to palpation Neurological: alert and answering questions appropriately. Psych: appropriate mood and affect   Labs: CBC    Component Value Date/Time   WBC 13.6 (H) 04/10/2022 0021   RBC 3.00 (L) 04/10/2022 0021   HGB 9.2 (L) 04/10/2022 0021   HCT 29.4 (L) 04/10/2022 0021   PLT 161 04/10/2022 0021   MCV 98.0 04/10/2022 0021   MCH 30.7 04/10/2022 0021   MCHC 31.3 04/10/2022 0021   RDW 13.2 04/10/2022 0021   LYMPHSABS 0.5 (L) 04/10/2022 0021   MONOABS 0.8 04/10/2022 0021   EOSABS 0.7 (H) 04/10/2022 0021   BASOSABS 0.0 04/10/2022  0021     CMP     Component Value Date/Time   NA 135 04/11/2022 0355   K 4.0 04/11/2022 0355   CL 102 04/11/2022 0355   CO2 27 04/11/2022 0355   GLUCOSE 121 (H) 04/11/2022 0355   BUN 25 (H) 04/11/2022 0355   CREATININE 1.02 (H) 04/11/2022 0355   CALCIUM 8.5 (L) 04/11/2022 0355   PROT 8.0 04/09/2022 0013   ALBUMIN 2.4 (L) 04/09/2022 0013   AST 30 04/09/2022 0013   ALT 30 04/09/2022 0013   ALKPHOS 69 04/09/2022 0013   BILITOT 1.0 04/09/2022 0013   GFRNONAA 54 (L) 04/11/2022 0355    Assessment/Plan:  Principal Problem:   Sepsis due to cellulitis ([place]) Active Problems:   Cellulitis of right lower extremity   Acute metabolic encephalopathy   Fever, unspecified   Patient Summary Penny [name] is a 87 y.o. woman with a history of DM2, TIA, osteoarthritis, acute on chronic CKD 3A, atopic dermatitis, and multiple hospitalizations for UTI's who presented with acute encephalopathy in the setting of sepsis and was admitted for right lower extremity cellulitis.   Cellulitis of right lower extremity Patient presented with sepsis secondary to RLE cellulitis. On exam today the edema on right lower shin to below right knee continuing to retract. Still some erythema and redness tracking to medial aspect of right lower extremity up to groin, but this is  also retracting. No warmth or swelling in right knee. Patient is still experiencing some tenderness to palpation on in the RLE and pain with ambulation. Overall, patient is clinically and hemodynamically stable. Will continue patient's antibiotic course (abx day 4) and monitor for worsening of erythema, warmth, and swelling. She is medically stable and ready for discharge pending placement and insurance authorization back to [place] 500 mg BID -F/u blood cultures  Hx of Atopic Dermatitis Patient reports triamcinolone has helped with bilateral upper arm atopic dermatitis. Patient's niece reports patient will  have dermatology appointment on 10/11. - Continue patient on triamcinolone cream BID - Continue patient on Sarna PRN   Syncope Patient had multiple syncopal episodes on presentation per nursing facility and EMS. Patient has had no further syncope on admission and is able to ambulate with some assistance. -PT recommends skilled nursing-short term rehab  Hypertension Overnight, BP's were 148-158/72-89. -Resume patient's home hydralazine   Constipation Patient has not had a bowel movement during this hospitalization. -Continue Miralax and senna   Hx of TIA No acute process on CT head -Continue home aspirin 81 mg     Diet: Carb-modified VTE: Enoxaparin IVF: N/A Code: DNR   Prior to Admission Living Arrangement: [facility] Anticipated Discharge Location: [facility] Barriers to Discharge: Insurance authorization to return to SNF Dispo: Anticipated discharge in approximately 1 day(s).    LOS: 1 day   Penny [name], MS3 Pager Number: [phone] 04/12/2022, 1:17 PM

## 2022-04-12 NOTE — TOC Progression Note (Signed)
Transition of Care [facility]) - Progression Note    Patient Details  Name: Penny [name] MRN: [id number] Date of Birth: February 10, 1935  Transition of Care [facility]) CM/SW Contact  Reece Agar, [place] Phone Number: 04/12/2022, 3:56 PM  Clinical Narrative:    CSW received a call from [employer] with information for a peer to peer. Information is [phone] opt 5 and is to be completed by tomorrow at [place] provided this information to pt medical team.   Expected Discharge Plan: [place] Barriers to Discharge: No Barriers Identified  Expected Discharge Plan and Services Expected Discharge Plan: [place] In-house Referral: Clinical Social Work   Post Acute Care Choice: [place] arrangements for the past 2 months: [place]                                       Social Determinants of Health (SDOH) Interventions    Readmission Risk Interventions     No data to display

## 2022-04-12 NOTE — Discharge Summary (Signed)
Name: Penny [name] MRN: [id number] DOB: [birth year] 87 y.o. PCP: Garwin Brothers, MD  Date of Admission: 04/09/2022 12:01 AM Date of Discharge: 04/13/22 Attending Physician: Lucious Groves, DO  Discharge Diagnosis: 1. Sepsis 2. Acute encephalopathy 3. Cellulitis of right lower extremity  Discharge Medications: Allergies as of 04/13/2022       Reactions   Glipizide Rash        Medication List     STOP taking these medications    carvedilol 12.5 MG tablet Commonly known as: COREG   feeding supplement (GLUCERNA SHAKE) Liqd       TAKE these medications    acetaminophen 325 MG tablet Commonly known as: TYLENOL Take 650 mg by mouth every 6 (six) hours as needed for mild pain.   aspirin 81 MG tablet Take 81 mg by mouth daily.   camphor-menthol lotion Commonly known as: SARNA Apply topically as needed for itching.   cephALEXin 500 MG capsule Commonly known as: KEFLEX Take 1 capsule (500 mg total) by mouth every 12 (twelve) hours for 3 days. Start taking on: April 14, 2022   hydrALAZINE 25 MG tablet Commonly known as: APRESOLINE Take 1 tablet (25 mg total) by mouth every 8 (eight) hours.   hydrOXYzine 25 MG tablet Commonly known as: ATARAX Take 25 mg by mouth every 6 (six) hours as needed for itching.   metFORMIN 500 MG tablet Commonly known as: GLUCOPHAGE Take 1 tablet (500 mg total) by mouth daily with breakfast.   Multivitamin Adults Tabs Take 1 tablet by mouth daily.   sennosides-docusate sodium 8.6-50 MG tablet Commonly known as: SENOKOT-S Take 2 tablets by mouth in the morning and at bedtime.   triamcinolone ointment 0.1 % Commonly known as: KENALOG Apply 1 Application topically 2 (two) times daily. Apply to arms        Disposition and follow-up:   Ms.Penny [name] was discharged from [facility] in stable condition. At the hospital follow up visit please address:  1.  Recommend following up on cellulitis of her RLE. Her  Keflex abx course will be complete by 10/1. Recommend following up on her atopic dermatitis, which is causing her to itch and break skin in her extremities.  2.  Labs / imaging needed at time of follow-up: BMP  3.  Pending labs/ test needing follow-up: Blood culture  Follow-up Appointments:  Follow-up Information     Garwin Brothers, MD Follow up.   Specialty: Internal Medicine Contact information: [address] [place] [address] [phone]                 Hospital Course by problem list: Penny [name] is a 87 y.o. woman with a history of DM2, TIA, osteoarthritis, acute on chronic CKD 3A, atopic dermatitis, and multiple hospitalizations for UTI's who presented with acute encephalopathy in the setting of sepsis and was admitted for right lower extremity cellulitis. Below is her hospital course by problem.  #Cellulitis of right lower extremity #Acute encephalopathy secondary to sepsis #Acute hypoxic respiratory failure secondary to sepsis Patient presented with acute encephalopathy and acute hypoxic respiratory failure in the setting of sepsis from right lower extremity cellulitis. She has had atopic dermatitis of the lowe extremities with frequent scratching which we believe to believe to be the etiology of her infection. RLE [place] for DVT was negative. In the ED, patient required supplemental O2, but was stable on room air once admitted. She presented with altered mental status in  ED, but was back to baseline during admission exam and remained at normal mental status during  hospitalization. She was started on broad spectrum antibiotics including Cefipime, vancomycin, and Flagyl on 9/25, which was changed to Ancef on 9/26 given source of infection was cellulitis. The cellulitis in her RLE gradually improved with the antibiotics and patient remained afebrile and hemodynamically stable throughout hospitalization. She transitioned to oral Keflex on 9/27 and will be on 3 additional days of  abx after today, for a total of 7-day antibiotic course. Blood cultures with no growth after 4 days.  #Syncope Patient had multiple syncopal episodes on the day of her presentation per nursing facility and EMS. Head CT was unremarkable. EKG was normal rate sinus rhythm with no ischemic changes. No cardiac symptoms. Patient's fatigue and weakness improved with fluids. She had no further syncope during hospitalization. She was able to walk with assistance during hospitalization, which per PT, is slightly off her baseline. They recommended SNF facility which her current living facility with work with patient's insurance to appeal denial of SNF.   #Hx of Atopic Dermatitis Patient was given triamcinolone for her bilateral upper arm atopic dermatitis. Patient's niece reports patient will have dermatology appointment on 10/11. Patient's niece reported that patient will get itch in her legs and will sometimes scratch her legs with her toenails - this could be a potential source of her recurrent cellulitis   #Hx of type 2 diabetes mellitus A1c this admission 6.7%. Patient's home regimen is metformin. Patient was on SSI and her CBG were stable during this hospitalization.    #CKD 3a Presented with 1.3 Cr. Baseline Cr around 1.2 prior to admission. Patient's kidney function improved with fluids and she was discharged with Cr of 1.02.  Discharge Exam:    Subjective: Patient evaluated at bedside this AM. She is feeling well without fevers or chills. Some tenderness in her right lower extremity. The steroid cream has been helping with her itch in upper extremities.  Physical Exam: BP 118/82   Pulse (!) 55   Temp 98.3 F (36.8 C) (Oral)   Resp 16   Wt 66.4 kg   LMP  (LMP Unknown)   SpO2 100%   BMI 22.93 kg/m   Constitutional: Pleasant, well-appearing and sitting in bed, in no acute distress Cardiovascular: Warm and well perfused. Respiratory: Effort is normal on room air. Musculoskeletal: Normal  bulk and tone. No left lower extremity edema. Skin: Warm and dry. Right lower shin to below right knee edema, warmth, and redness improving from yesterday. Erythema that tracks to medial aspect of shin up towards groin is improving. No warmth or swelling in right knee. RLE tender to palpation. Neurological: no apparent focal deficits noted.  Pertinent Labs, Studies, and Procedures:   VAS [place] LOWER EXTREMITY VENOUS (DVT)  Result Date: 04/09/2022  Lower Venous DVT Study Patient Name:  EUN VERMEER Eakes  Date of Exam:   04/09/2022 Medical Rec #: [id number]     Accession #:    [id number] Date of Birth: 05/01/1935     Patient Gender: F Patient Age:   89 years Exam Location:  [facility] Procedure:      VAS [place] LOWER EXTREMITY VENOUS (DVT) Referring Phys: ERIK HOFFMAN --------------------------------------------------------------------------------  Indications: Erythema, edema RT>LT.  Limitations: Poor ultrasound/tissue interface and Patient sensitivity to compression/probe pressure. Limited patient mobility. Comparison Study: 07-06-2021 Prior bilateral lower extremity venous study was  negative for DVT. Performing Technologist: Darlin Coco RDMS, RVT    Summary: RIGHT: - There is no evidence of deep vein thrombosis in the lower extremity. However, portions of this examination were limited- see technologist comments above.  - No cystic structure found in the popliteal fossa.  LEFT: - There is no evidence of deep vein thrombosis in the lower extremity. However, portions of this examination were limited- see technologist comments above.  - No cystic structure found in the popliteal fossa.  *See table(s) above for measurements and observations. Electronically signed by Servando Snare MD on 04/09/2022 at 6:07:01 PM.    Final    CT Angio Chest PE W and/or Wo Contrast  Result Date: 04/09/2022 CLINICAL DATA:  Pulmonary embolism (PE) suspected, positive D-dimer; Abdominal pain, acute, nonlocalized EXAM:  CT ANGIOGRAPHY CHEST CT ABDOMEN AND PELVIS WITH CONTRAST TECHNIQUE: Multidetector CT imaging of the chest was performed using the standard protocol during bolus administration of intravenous contrast. Multiplanar CT image reconstructions and MIPs were obtained to evaluate the vascular anatomy. Multidetector CT imaging of the abdomen and pelvis was performed using the standard protocol during bolus administration of intravenous contrast. RADIATION DOSE REDUCTION: This exam was performed according to the departmental dose-optimization program which includes automated exposure control, adjustment of the mA and/or kV according to patient size and/or use of iterative reconstruction technique. CONTRAST:  37m OMNIPAQUE IOHEXOL 350 MG/ML SOLN COMPARISON:  July 06, 2021   IMPRESSION: 1. Intraluminal air within the bladder. Recommend correlation for any recent instrumentation. 2. No acute pulmonary embolism. 3. Otherwise no CT etiology for abdominal pain is identified. Moderate colonic stool burden diffusely throughout the colon. 4. Mild increase in anterior mediastinal nodular soft tissue. This could reflect underlying thymic hyperplasia. This would be better characterized with dedicated nonemergent chest MRI. 5. Similar appearance of multiple predominately LEFT-sided thyroid nodules. Nonemergent thyroid ultrasound could be considered if not previously performed. 6. Scattered tiny pulmonary nodules measuring up to 4 mm, unchanged in comparison to prior. No follow-up needed if patient is low-risk (and has no known or suspected primary neoplasm). Non-contrast chest CT can be considered in 12 months if patient is high-risk. This recommendation follows the consensus statement: Guidelines for Management of Incidental Pulmonary Nodules Detected on CT Images: From the Fleischner Society 2017; Radiology 2017; 284:228-243. Aortic Atherosclerosis (ICD10-I70.0). Electronically Signed   By: SValentino SaxonM.D.   On:  04/09/2022 10:31       Latest Ref Rng & Units 04/10/2022   12:21 AM 04/09/2022   12:13 AM 11/27/2021    4:31 AM  CBC  WBC 4.0 - 10.5 K/uL 13.6  13.5  16.9   Hemoglobin 12.0 - 15.0 g/dL 9.2  10.1  8.5   Hematocrit 36.0 - 46.0 % 29.4  32.1  26.7   Platelets 150 - 400 K/uL 161  165  187        Latest Ref Rng & Units 04/11/2022    3:55 AM 04/10/2022   12:21 AM 04/09/2022   12:13 AM  BMP  Glucose 70 - 99 mg/dL 121  124  197   BUN 8 - 23 mg/dL '25  26  24   '$ Creatinine 0.44 - 1.00 mg/dL 1.02  1.12  1.30   Sodium 135 - 145 mmol/L 135  134  133   Potassium 3.5 - 5.1 mmol/L 4.0  4.1  4.0   Chloride 98 - 111 mmol/L 102  104  100   CO2 22 - 32 mmol/L 27  26  24   Calcium 8.9 - 10.3 mg/dL 8.5  8.4  8.7     Discharge Instructions: Ms. Pilling, you were hospitalized for an infection of the skin (cellulitis) in your right leg that led to some confusion and fainting when you presented to the emergency department. Your symptoms improved with rest and fluids, and your skin infection improved with antibiotics.  As you will participate in rehab at your skilled nursing facility, we will be sending you with 3 more days of your current oral antibiotics course.  Please follow-up in 2 weeks with your PCP or dermatologist for your skin infection and atopic dermatitis.  Please note these changes made to your medications:   Please START taking:   Keflex capsule 500 mg 2 times a day for your right leg skin infection Tramcinolone cream two times daily for your atopic dermatitis on your arms  If you experience worsening of your skin infection including more swelling, redness, or warmth in the leg or if you experience fever and confusion, please return to the emergency department for evaluation.   It was a pleasure taking care of you, thank you for allowing [place] to be part of your care.   Max, Dr. Eloisa Northern, and Dr. Johnney Ou  Signed: [place] (Max) [place], MS3

## 2022-04-13 DIAGNOSIS — Z87891 Personal history of nicotine dependence: Secondary | ICD-10-CM

## 2022-04-13 DIAGNOSIS — G9340 Encephalopathy, unspecified: Secondary | ICD-10-CM

## 2022-04-13 DIAGNOSIS — J9691 Respiratory failure, unspecified with hypoxia: Secondary | ICD-10-CM

## 2022-04-13 LAB — GLUCOSE, CAPILLARY: Glucose-Capillary: 104 mg/dL — ABNORMAL HIGH (ref 70–99)

## 2022-04-13 MED ORDER — CAMPHOR-MENTHOL 0.5-0.5 % EX LOTN: TOPICAL_LOTION | CUTANEOUS | 0 refills | Status: AC | PRN

## 2022-04-13 MED ORDER — CEPHALEXIN 500 MG PO CAPS: 500.0000 mg | ORAL_CAPSULE | Freq: Two times a day (BID) | ORAL | 0 refills | Status: AC

## 2022-04-13 MED ORDER — TRIAMCINOLONE ACETONIDE 0.1 % EX OINT: 1.0000 | TOPICAL_OINTMENT | Freq: Two times a day (BID) | CUTANEOUS | 0 refills | Status: AC

## 2022-04-13 NOTE — Progress Notes (Signed)
[name] here now to pick up patient for transport. Patient transported to [place] via [facility] with all belongings.

## 2022-04-13 NOTE — TOC Progression Note (Addendum)
Transition of Care [facility]) - Progression Note    Patient Details  Name: Penny [name] MRN: [id number] Date of Birth: December 18, 1934  Transition of Care [facility]) CM/SW Contact  Reece Agar, [place] Phone Number: 04/13/2022, 10:29 AM  Clinical Narrative:    CSW received a call from [place] to provide an update on pt auth denial. Pt appeals number is [phone]. CSW will update pt niece and [facility] about the denial and appeal decision.  Pt niece would like to appeal the insurance denial. CSW provided her with the appeal information.   Juliann Pulse with [facility] contacted CSW stating that the pt can return today with while appeal is pending. CSW informed pt niece who is also the HCPOA  Expected Discharge Plan: Skilled Nursing Facility Barriers to Discharge: No Barriers Identified  Expected Discharge Plan and Services Expected Discharge Plan: [place] In-house Referral: Clinical Social Work   Post Acute Care Choice: [place] Living arrangements for the past 2 months: [place]                                       Social Determinants of Health (SDOH) Interventions    Readmission Risk Interventions     No data to display

## 2022-04-13 NOTE — TOC Transition Note (Signed)
Transition of Care [facility]) - CM/SW Discharge Note   Patient Details  Name: Penny [name] MRN: [id number] Date of Birth: May 22, 1935  Transition of Care [facility]) CM/SW Contact:  Tresa Endo Phone Number: 04/13/2022, 12:50 PM   Clinical Narrative:    Patient will DC to: [place] date: 04/13/2022 Family notified: Pt Niece Transport by: Corey Harold   Per MD patient ready for DC to [facility] room 217a. RN to call report prior to discharge [phone]). RN, patient, patient's family, and facility notified of DC.  Ambulance transport requested for patient.   CSW will sign off for now as social work intervention is no longer needed. Please consult [place] again if new needs arise.     Final next level of care: [place] Barriers to Discharge: No Barriers Identified   Patient Goals and CMS Choice Patient states their goals for this hospitalization and ongoing recovery are:: To return to [place] Medicare.gov Compare Post Acute Care list provided to:: Patient Choice offered to / list presented to : Patient  Discharge Placement                       Discharge Plan and Services In-house Referral: Clinical Social Work   Post Acute Care Choice: [place]                               Social Determinants of Health (SDOH) Interventions     Readmission Risk Interventions     No data to display

## 2022-04-13 NOTE — Progress Notes (Signed)
Physical Therapy Treatment Patient Details Name: Penny [name] MRN: [id number] DOB: 08-04-1934 Today's Date: 04/13/2022   History of Present Illness Pt is 87 yo female admitted on 04/09/22 with syncopal episodes, metabolic encephalopathy, and R LE cellulitis.  Pt with hx of DM2, TIA, OA, CKD, atopic dermatitis.    PT Comments    Pt making gradual progress.  Slight improvement in knee flexion but still limited and walks feet under body when standing.  Ambulated 20' with RW and min A.  Pt continues to report she was able to get OOB and go to bathroom on her on at nursing home, so she is still below baseline.  Continue plan of care.     Recommendations for follow up therapy are one component of a multi-disciplinary discharge planning process, led by the attending physician.  Recommendations may be updated based on patient status, additional functional criteria and insurance authorization.  Follow Up Recommendations  Skilled nursing-short term rehab (<3 hours/day) Can patient physically be transported by private vehicle: No   Assistance Recommended at Discharge Frequent or constant Supervision/Assistance  Patient can return home with the following A little help with walking and/or transfers;A little help with bathing/dressing/bathroom;Assistance with cooking/housework;Help with stairs or ramp for entrance   Equipment Recommendations  None recommended by PT    Recommendations for Other Services       Precautions / Restrictions Precautions Precautions: Fall     Mobility  Bed Mobility Overal bed mobility: Needs Assistance Bed Mobility: Supine to Sit     Supine to sit: Mod assist     General bed mobility comments: Increased time, cues for sequencing then mod A to lift trunk and scoot forward - heavy posterior lean during transfers, very stiff    Transfers Overall transfer level: Needs assistance Equipment used: Rolling walker (2 wheels) Transfers: Sit to/from Stand, Bed to  chair/wheelchair/BSC Sit to Stand: Mod assist, From elevated surface   Step pivot transfers: Min assist       General transfer comment: Pt with limited knee flexion and unable to get feet under body for efficient standing. Worked on knee ROM prior to transfer with slight improvement. Required mod A from elevated bed and bsc with heavy use of UE and once partially up pt walks feet under body.  Similarly, when sitting she reaches back to support self on chair and walks feet out due to decreased knee flexion.  Once standing, min A to steady during step pivot.  Sit to stand from bed and [facility]    Ambulation/Gait Ambulation/Gait assistance: Min assist Gait Distance (Feet): 20 Feet Assistive device: Rolling walker (2 wheels) Gait Pattern/deviations: Step-to pattern, Decreased stride length, Shuffle Gait velocity: decreased     General Gait Details: Small steps with cues for posture and RW; fatigued after 20 '   Stairs             Wheelchair Mobility    Modified Rankin (Stroke Patients Only)       Balance Overall balance assessment: Needs assistance Sitting-balance support: Bilateral upper extremity supported Sitting balance-Leahy Scale: Poor Sitting balance - Comments: Requiring UE support and tending to lean posteriorly.  Initially mod A progressing to min guard   Standing balance support: Bilateral upper extremity supported, Reliant on assistive device for balance Standing balance-Leahy Scale: Poor Standing balance comment: RW and min guard/min A  Cognition Arousal/Alertness: Awake/alert Behavior During Therapy: WFL for tasks assessed/performed Overall Cognitive Status: No family/caregiver present to determine baseline cognitive functioning Area of Impairment: Orientation                 Orientation Level: Situation             General Comments: A and O x 3; followed basic commands; answered questions appropriatley         Exercises Total Joint Exercises Knee Flexion: Both, 10 reps, Seated, AAROM (With stretch to tolerance 5 sec hold each rep) General Exercises - Lower Extremity Ankle Circles/Pumps: AROM, Both, 10 reps, Seated (with heel cord stretch) Verge Arc Quad: AROM, Both, 10 reps, Seated    General Comments General comments (skin integrity, edema, etc.): VSS      Pertinent Vitals/Pain Pain Assessment Pain Assessment: Faces Faces Pain Scale: Hurts little more Pain Location: bil knees Pain Descriptors / Indicators: Sore ("stiff") Pain Intervention(s): Limited activity within patient's tolerance, Monitored during session (warm up exercises)    Home Living                          Prior Function            PT Goals (current goals can now be found in the care plan section) Progress towards PT goals: Progressing toward goals    Frequency    Min 2X/week      PT Plan Current plan remains appropriate    Co-evaluation              AM-PAC PT "6 Clicks" Mobility   Outcome Measure  Help needed turning from your back to your side while in a flat bed without using bedrails?: A Little Help needed moving from lying on your back to sitting on the side of a flat bed without using bedrails?: A Lot Help needed moving to and from a bed to a chair (including a wheelchair)?: A Lot Help needed standing up from a chair using your arms (e.g., wheelchair or bedside chair)?: A Lot Help needed to walk in hospital room?: A Little Help needed climbing 3-5 steps with a railing? : A Lot 6 Click Score: 14    End of Session Equipment Utilized During Treatment: Gait belt Activity Tolerance: Patient tolerated treatment well Patient left: with chair alarm set;in chair;with call bell/phone within reach Nurse Communication: Mobility status PT Visit Diagnosis: Other abnormalities of gait and mobility (R26.89);Muscle weakness (generalized) (M62.81)     Time: 6811-5726 PT Time Calculation  (min) (ACUTE ONLY): 30 min  Charges:  $Gait Training: 8-22 mins $Therapeutic Exercise: 8-22 mins                     Abran Richard, PT Acute Rehab [facility] [place] 04/13/2022, 1:18 PM

## 2022-04-13 NOTE — Progress Notes (Signed)
Called report to [facility] room 217a.  [phone]) spoke with nurse biance who will be taking care of patient when she gets there. Nurse verbalized understanding of report and had no further questions. [name] called. Patient waiting in room.

## 2022-04-14 LAB — CULTURE, BLOOD (ROUTINE X 2)
Culture: NO GROWTH
Culture: NO GROWTH
Special Requests: ADEQUATE
Special Requests: ADEQUATE

## 2022-04-16 DIAGNOSIS — N390 Urinary tract infection, site not specified: Secondary | ICD-10-CM | POA: Diagnosis not present

## 2022-04-16 DIAGNOSIS — L299 Pruritus, unspecified: Secondary | ICD-10-CM | POA: Diagnosis not present

## 2022-04-16 DIAGNOSIS — L209 Atopic dermatitis, unspecified: Secondary | ICD-10-CM | POA: Diagnosis not present

## 2022-04-16 DIAGNOSIS — L03115 Cellulitis of right lower limb: Secondary | ICD-10-CM | POA: Diagnosis not present

## 2022-04-19 DIAGNOSIS — D638 Anemia in other chronic diseases classified elsewhere: Secondary | ICD-10-CM | POA: Diagnosis not present

## 2022-04-19 DIAGNOSIS — E1159 Type 2 diabetes mellitus with other circulatory complications: Secondary | ICD-10-CM | POA: Diagnosis not present

## 2022-04-19 DIAGNOSIS — L03115 Cellulitis of right lower limb: Secondary | ICD-10-CM | POA: Diagnosis not present

## 2022-04-19 DIAGNOSIS — L2089 Other atopic dermatitis: Secondary | ICD-10-CM | POA: Diagnosis not present

## 2022-04-20 DIAGNOSIS — G3184 Mild cognitive impairment, so stated: Secondary | ICD-10-CM | POA: Diagnosis not present

## 2022-04-20 DIAGNOSIS — M6281 Muscle weakness (generalized): Secondary | ICD-10-CM | POA: Diagnosis not present

## 2022-04-20 DIAGNOSIS — I129 Hypertensive chronic kidney disease with stage 1 through stage 4 chronic kidney disease, or unspecified chronic kidney disease: Secondary | ICD-10-CM | POA: Diagnosis not present

## 2022-04-20 DIAGNOSIS — E079 Disorder of thyroid, unspecified: Secondary | ICD-10-CM | POA: Diagnosis not present

## 2022-04-20 DIAGNOSIS — M5134 Other intervertebral disc degeneration, thoracic region: Secondary | ICD-10-CM | POA: Diagnosis not present

## 2022-04-20 DIAGNOSIS — M170 Bilateral primary osteoarthritis of knee: Secondary | ICD-10-CM | POA: Diagnosis not present

## 2022-04-20 DIAGNOSIS — I10 Essential (primary) hypertension: Secondary | ICD-10-CM | POA: Diagnosis not present

## 2022-04-20 DIAGNOSIS — M160 Bilateral primary osteoarthritis of hip: Secondary | ICD-10-CM | POA: Diagnosis not present

## 2022-04-20 DIAGNOSIS — Z8673 Personal history of transient ischemic attack (TIA), and cerebral infarction without residual deficits: Secondary | ICD-10-CM | POA: Diagnosis not present

## 2022-04-20 DIAGNOSIS — I739 Peripheral vascular disease, unspecified: Secondary | ICD-10-CM | POA: Diagnosis not present

## 2022-04-20 DIAGNOSIS — N1831 Chronic kidney disease, stage 3a: Secondary | ICD-10-CM | POA: Diagnosis not present

## 2022-04-24 DIAGNOSIS — B351 Tinea unguium: Secondary | ICD-10-CM | POA: Diagnosis not present

## 2022-04-24 DIAGNOSIS — L299 Pruritus, unspecified: Secondary | ICD-10-CM | POA: Diagnosis not present

## 2022-04-24 DIAGNOSIS — L209 Atopic dermatitis, unspecified: Secondary | ICD-10-CM | POA: Diagnosis not present

## 2022-04-25 ENCOUNTER — Other Ambulatory Visit: Payer: Self-pay | Admitting: *Deleted

## 2022-04-25 DIAGNOSIS — L299 Pruritus, unspecified: Secondary | ICD-10-CM | POA: Diagnosis not present

## 2022-04-25 DIAGNOSIS — L853 Xerosis cutis: Secondary | ICD-10-CM | POA: Diagnosis not present

## 2022-04-25 DIAGNOSIS — K068 Other specified disorders of gingiva and edentulous alveolar ridge: Secondary | ICD-10-CM | POA: Diagnosis not present

## 2022-04-25 DIAGNOSIS — D1724 Benign lipomatous neoplasm of skin and subcutaneous tissue of left leg: Secondary | ICD-10-CM | POA: Diagnosis not present

## 2022-04-25 NOTE — Patient Outreach (Signed)
Penny [name] resides in [facility] SNF.   Confirmed with SNF SW and therapy manager at [facility] Penny [name] is a [place] term care resident.  No identifiable [facility] needs.   Marthenia Rolling, MSN, RN,BSN [place] Acute Care Coordinator [phone] (Direct dial)

## 2022-04-26 DIAGNOSIS — L97122 Non-pressure chronic ulcer of left thigh with fat layer exposed: Secondary | ICD-10-CM | POA: Diagnosis not present

## 2022-04-26 DIAGNOSIS — L89150 Pressure ulcer of sacral region, unstageable: Secondary | ICD-10-CM | POA: Diagnosis not present

## 2022-04-27 DIAGNOSIS — L54 Erythema in diseases classified elsewhere: Secondary | ICD-10-CM | POA: Diagnosis not present

## 2022-04-27 DIAGNOSIS — D485 Neoplasm of uncertain behavior of skin: Secondary | ICD-10-CM | POA: Diagnosis not present

## 2022-05-01 DIAGNOSIS — L97121 Non-pressure chronic ulcer of left thigh limited to breakdown of skin: Secondary | ICD-10-CM | POA: Diagnosis not present

## 2022-05-01 DIAGNOSIS — L918 Other hypertrophic disorders of the skin: Secondary | ICD-10-CM | POA: Diagnosis not present

## 2022-05-01 DIAGNOSIS — D638 Anemia in other chronic diseases classified elsewhere: Secondary | ICD-10-CM | POA: Diagnosis not present

## 2022-05-01 DIAGNOSIS — E1159 Type 2 diabetes mellitus with other circulatory complications: Secondary | ICD-10-CM | POA: Diagnosis not present

## 2022-05-03 DIAGNOSIS — M6281 Muscle weakness (generalized): Secondary | ICD-10-CM | POA: Diagnosis not present

## 2022-05-03 DIAGNOSIS — K068 Other specified disorders of gingiva and edentulous alveolar ridge: Secondary | ICD-10-CM | POA: Diagnosis not present

## 2022-05-03 DIAGNOSIS — I129 Hypertensive chronic kidney disease with stage 1 through stage 4 chronic kidney disease, or unspecified chronic kidney disease: Secondary | ICD-10-CM | POA: Diagnosis not present

## 2022-05-03 DIAGNOSIS — E1159 Type 2 diabetes mellitus with other circulatory complications: Secondary | ICD-10-CM | POA: Diagnosis not present

## 2022-05-03 DIAGNOSIS — Z0189 Encounter for other specified special examinations: Secondary | ICD-10-CM | POA: Diagnosis not present

## 2022-05-03 DIAGNOSIS — G3184 Mild cognitive impairment, so stated: Secondary | ICD-10-CM | POA: Diagnosis not present

## 2022-05-09 DIAGNOSIS — N6313 Unspecified lump in the right breast, lower outer quadrant: Secondary | ICD-10-CM | POA: Diagnosis not present

## 2022-05-09 DIAGNOSIS — L97121 Non-pressure chronic ulcer of left thigh limited to breakdown of skin: Secondary | ICD-10-CM | POA: Diagnosis not present

## 2022-05-09 DIAGNOSIS — L918 Other hypertrophic disorders of the skin: Secondary | ICD-10-CM | POA: Diagnosis not present

## 2022-05-09 DIAGNOSIS — E1159 Type 2 diabetes mellitus with other circulatory complications: Secondary | ICD-10-CM | POA: Diagnosis not present

## 2022-05-09 DIAGNOSIS — R21 Rash and other nonspecific skin eruption: Secondary | ICD-10-CM | POA: Diagnosis not present

## 2022-05-09 DIAGNOSIS — N6459 Other signs and symptoms in breast: Secondary | ICD-10-CM | POA: Diagnosis not present

## 2022-05-09 DIAGNOSIS — D638 Anemia in other chronic diseases classified elsewhere: Secondary | ICD-10-CM | POA: Diagnosis not present

## 2022-05-10 DIAGNOSIS — R229 Localized swelling, mass and lump, unspecified: Secondary | ICD-10-CM | POA: Diagnosis not present

## 2022-05-15 DIAGNOSIS — L97121 Non-pressure chronic ulcer of left thigh limited to breakdown of skin: Secondary | ICD-10-CM | POA: Diagnosis not present

## 2022-05-15 DIAGNOSIS — L918 Other hypertrophic disorders of the skin: Secondary | ICD-10-CM | POA: Diagnosis not present

## 2022-05-15 DIAGNOSIS — D638 Anemia in other chronic diseases classified elsewhere: Secondary | ICD-10-CM | POA: Diagnosis not present

## 2022-05-15 DIAGNOSIS — E1159 Type 2 diabetes mellitus with other circulatory complications: Secondary | ICD-10-CM | POA: Diagnosis not present

## 2022-05-16 DIAGNOSIS — L03116 Cellulitis of left lower limb: Secondary | ICD-10-CM | POA: Diagnosis not present

## 2022-05-22 DIAGNOSIS — L918 Other hypertrophic disorders of the skin: Secondary | ICD-10-CM | POA: Diagnosis not present

## 2022-05-22 DIAGNOSIS — D638 Anemia in other chronic diseases classified elsewhere: Secondary | ICD-10-CM | POA: Diagnosis not present

## 2022-05-22 DIAGNOSIS — L97121 Non-pressure chronic ulcer of left thigh limited to breakdown of skin: Secondary | ICD-10-CM | POA: Diagnosis not present

## 2022-05-22 DIAGNOSIS — E1159 Type 2 diabetes mellitus with other circulatory complications: Secondary | ICD-10-CM | POA: Diagnosis not present

## 2022-05-23 DIAGNOSIS — L03115 Cellulitis of right lower limb: Secondary | ICD-10-CM | POA: Diagnosis not present

## 2022-05-23 DIAGNOSIS — E1151 Type 2 diabetes mellitus with diabetic peripheral angiopathy without gangrene: Secondary | ICD-10-CM | POA: Diagnosis not present

## 2022-05-23 DIAGNOSIS — Z23 Encounter for immunization: Secondary | ICD-10-CM | POA: Diagnosis not present

## 2022-05-23 DIAGNOSIS — Z7984 Long term (current) use of oral hypoglycemic drugs: Secondary | ICD-10-CM | POA: Diagnosis not present

## 2022-05-23 DIAGNOSIS — B351 Tinea unguium: Secondary | ICD-10-CM | POA: Diagnosis not present

## 2022-05-23 DIAGNOSIS — Z8673 Personal history of transient ischemic attack (TIA), and cerebral infarction without residual deficits: Secondary | ICD-10-CM | POA: Diagnosis not present

## 2022-05-23 DIAGNOSIS — M6281 Muscle weakness (generalized): Secondary | ICD-10-CM | POA: Diagnosis not present

## 2022-05-25 DIAGNOSIS — I10 Essential (primary) hypertension: Secondary | ICD-10-CM | POA: Diagnosis not present

## 2022-05-25 DIAGNOSIS — I739 Peripheral vascular disease, unspecified: Secondary | ICD-10-CM | POA: Diagnosis not present

## 2022-05-25 DIAGNOSIS — Z8673 Personal history of transient ischemic attack (TIA), and cerebral infarction without residual deficits: Secondary | ICD-10-CM | POA: Diagnosis not present

## 2022-05-25 DIAGNOSIS — I129 Hypertensive chronic kidney disease with stage 1 through stage 4 chronic kidney disease, or unspecified chronic kidney disease: Secondary | ICD-10-CM | POA: Diagnosis not present

## 2022-05-25 DIAGNOSIS — M6281 Muscle weakness (generalized): Secondary | ICD-10-CM | POA: Diagnosis not present

## 2022-06-01 DIAGNOSIS — E1159 Type 2 diabetes mellitus with other circulatory complications: Secondary | ICD-10-CM | POA: Diagnosis not present

## 2022-06-01 DIAGNOSIS — L918 Other hypertrophic disorders of the skin: Secondary | ICD-10-CM | POA: Diagnosis not present

## 2022-06-01 DIAGNOSIS — D638 Anemia in other chronic diseases classified elsewhere: Secondary | ICD-10-CM | POA: Diagnosis not present

## 2022-06-01 DIAGNOSIS — L22 Diaper dermatitis: Secondary | ICD-10-CM | POA: Diagnosis not present

## 2022-06-03 DIAGNOSIS — L03115 Cellulitis of right lower limb: Secondary | ICD-10-CM | POA: Diagnosis not present

## 2022-06-04 DIAGNOSIS — L89616 Pressure-induced deep tissue damage of right heel: Secondary | ICD-10-CM | POA: Diagnosis not present

## 2022-06-04 DIAGNOSIS — R300 Dysuria: Secondary | ICD-10-CM | POA: Diagnosis not present

## 2022-06-04 DIAGNOSIS — D72829 Elevated white blood cell count, unspecified: Secondary | ICD-10-CM | POA: Diagnosis not present

## 2022-06-04 DIAGNOSIS — L89626 Pressure-induced deep tissue damage of left heel: Secondary | ICD-10-CM | POA: Diagnosis not present

## 2022-06-04 DIAGNOSIS — L22 Diaper dermatitis: Secondary | ICD-10-CM | POA: Diagnosis not present

## 2022-06-04 DIAGNOSIS — L89310 Pressure ulcer of right buttock, unstageable: Secondary | ICD-10-CM | POA: Diagnosis not present

## 2022-06-05 DIAGNOSIS — D72829 Elevated white blood cell count, unspecified: Secondary | ICD-10-CM | POA: Diagnosis not present

## 2022-06-05 DIAGNOSIS — R300 Dysuria: Secondary | ICD-10-CM | POA: Diagnosis not present

## 2022-06-12 DIAGNOSIS — L22 Diaper dermatitis: Secondary | ICD-10-CM | POA: Diagnosis not present

## 2022-06-12 DIAGNOSIS — L89616 Pressure-induced deep tissue damage of right heel: Secondary | ICD-10-CM | POA: Diagnosis not present

## 2022-06-12 DIAGNOSIS — L89626 Pressure-induced deep tissue damage of left heel: Secondary | ICD-10-CM | POA: Diagnosis not present

## 2022-06-12 DIAGNOSIS — L89310 Pressure ulcer of right buttock, unstageable: Secondary | ICD-10-CM | POA: Diagnosis not present

## 2022-06-18 DIAGNOSIS — L22 Diaper dermatitis: Secondary | ICD-10-CM | POA: Diagnosis not present

## 2022-06-18 DIAGNOSIS — L89626 Pressure-induced deep tissue damage of left heel: Secondary | ICD-10-CM | POA: Diagnosis not present

## 2022-06-18 DIAGNOSIS — L89310 Pressure ulcer of right buttock, unstageable: Secondary | ICD-10-CM | POA: Diagnosis not present

## 2022-06-18 DIAGNOSIS — L89616 Pressure-induced deep tissue damage of right heel: Secondary | ICD-10-CM | POA: Diagnosis not present

## 2022-06-26 DIAGNOSIS — E43 Unspecified severe protein-calorie malnutrition: Secondary | ICD-10-CM | POA: Diagnosis not present

## 2022-06-26 DIAGNOSIS — L22 Diaper dermatitis: Secondary | ICD-10-CM | POA: Diagnosis not present

## 2022-06-26 DIAGNOSIS — E1159 Type 2 diabetes mellitus with other circulatory complications: Secondary | ICD-10-CM | POA: Diagnosis not present

## 2022-06-26 DIAGNOSIS — D638 Anemia in other chronic diseases classified elsewhere: Secondary | ICD-10-CM | POA: Diagnosis not present

## 2022-06-28 DIAGNOSIS — L299 Pruritus, unspecified: Secondary | ICD-10-CM | POA: Diagnosis not present

## 2022-06-28 DIAGNOSIS — R21 Rash and other nonspecific skin eruption: Secondary | ICD-10-CM | POA: Diagnosis not present

## 2022-06-28 DIAGNOSIS — L209 Atopic dermatitis, unspecified: Secondary | ICD-10-CM | POA: Diagnosis not present

## 2022-07-13 DIAGNOSIS — L209 Atopic dermatitis, unspecified: Secondary | ICD-10-CM | POA: Diagnosis not present

## 2022-08-28 ENCOUNTER — Encounter (INDEPENDENT_AMBULATORY_CARE_PROVIDER_SITE_OTHER): Payer: Medicare Other | Admitting: Ophthalmology

## 2022-11-26 ENCOUNTER — Encounter (INDEPENDENT_AMBULATORY_CARE_PROVIDER_SITE_OTHER): Payer: Medicare Other | Admitting: Ophthalmology

## 2022-12-11 ENCOUNTER — Encounter: Payer: Self-pay | Admitting: Cardiovascular Disease

## 2022-12-11 NOTE — Progress Notes (Unsigned)
Cardiology Office Note:    Date:  12/12/2022   ID:  Penny [name], DOB Nov 08, 1934, MRN [id number]  PCP:  Eloisa Northern, MD   [facility] Providers Cardiologist:  Bethsaida Siegenthaler  Click to update primary MD,subspecialty MD or APP then REFRESH:1}    Referring MD: Durward Parcel, FNP   Chief Complaint  Patient presents with   Loss of Consciousness    History of Present Illness:    Penny [name] is a 88 y.o. female with a hx of CKD, bradycardia, HTN,  dementia  We are asked to see her for an episode of syncope   Seen with niece, Dionne Milo  Lives at [facility] care facility   She was recently admitted to [facility] in Sept, 2023 for sepsis and acute encephalopathy  Her encephalopathy was preceded with several episodes of syncope ( same day )  Niece says her HR has been slow at the nursing appt .  Has had 1 episode of syncope associated with nausea and vomitting  She does not recall passing out outside of that episode    Uses a walker in the nursing home Used wheelchair when she is going out  She has asymptomatic sinus bradycardia.  I [place] discussion with the patient and her daughter.  She is not interested in having any surgical procedures.  She is not interested in getting a pacemaker. Given this, there is no indication for monitoring or other procedures      Past Medical History:  Diagnosis Date   Abnormality of albumin    Acute respiratory failure with hypoxia (HCC)    Anemia in chronic kidney disease    Bacteriuria    Bilateral primary osteoarthritis of knee    Bradycardia    Cellulitis of right lower limb    CKD (chronic kidney disease) stage 3, GFR 30-59 ml/min (HCC) 04/17/2021   CKD stage 3a, GFR 45-59 ml/min (HCC)    Cognitive communication deficit    Constipation    DDD (degenerative disc disease), thoracic    Diabetes mellitus without complication (HCC)    Disorder of thyroid    Dry mouth, unspecified    HTN (hypertension)    Hypo-osmolality and  hyponatremia    Hypocalcemia    Irritant contact dermatitis due to fecal, urinary or dual incontinence    Irritant contact dermatitis due to incontinence of both feces and urine    Metabolic encephalopathy    Mild cognitive impairment of uncertain or unknown etiology    Muscle weakness    Nicotine dependence    Other atopic dermatitis    Other iron deficiency anemias    Pain in right lower leg    Personal history of TIA (transient ischemic attack)    Personal history of urinary (tract) infections    Polyneuropathy    Posterior subluxation of right humerus    Primary osteoarthritis of left shoulder    Primary osteoarthritis, left shoulder    Pruritus    PVD (peripheral vascular disease) (HCC)    Pyuria    Unspecified dementia, moderate, without behavioral disturbance, psychotic disturbance, mood disturbance, and anxiety (HCC)    Unspecified severe protein-calorie malnutrition (HCC)    Urticaria     Past Surgical History:  Procedure Laterality Date   INCISION AND DRAINAGE HIP Right 05/11/2021   Procedure: IRRIGATION AND DEBRIDEMENT HIP PLACEMENT OF WOUND VAC;  Surgeon: Jones Broom, MD;  Location: [facility]S;  Service: Orthopedics;  Laterality: Right;    Current Medications: Current Meds  Medication Sig   acetaminophen (TYLENOL) 325 MG tablet Take 650 mg by mouth every 6 (six) hours as needed for mild pain.   aspirin 81 MG tablet Take 81 mg by mouth daily.   camphor-menthol (SARNA) lotion Apply topically as needed for itching.   cetirizine (ZYRTEC) 10 MG tablet Take 10 mg by mouth daily.   famotidine (PEPCID) 20 MG tablet Take 20 mg by mouth at bedtime.   ferrous sulfate 324 MG TBEC Take 324 mg by mouth 2 (two) times daily.   hydrALAZINE (APRESOLINE) 25 MG tablet Take 1 tablet (25 mg total) by mouth every 8 (eight) hours.   hydrOXYzine (ATARAX) 25 MG tablet Take 25 mg by mouth every 6 (six) hours as needed for itching.   metFORMIN (GLUCOPHAGE) 500 MG tablet Take 1 tablet (500  mg total) by mouth daily with breakfast.   Multiple Vitamins-Minerals (MULTIVITAMIN ADULTS) TABS Take 1 tablet by mouth daily.   polyethylene glycol (MIRALAX / GLYCOLAX) 17 g packet Take 17 g by mouth daily.   sennosides-docusate sodium (SENOKOT-S) 8.6-50 MG tablet Take 2 tablets by mouth in the morning and at bedtime.   triamcinolone ointment (KENALOG) 0.1 % Apply 1 Application topically 2 (two) times daily. Apply to arms     Allergies:   Glipizide   Social History   Socioeconomic History   Marital status: Single    Spouse name: Not on file   Number of children: Not on file   Years of education: Not on file   Highest education level: Not on file  Occupational History   Not on file  Tobacco Use   Smoking status: Never   Smokeless tobacco: Former    Types: Snuff, Dorna Bloom    Quit date: 05/06/1956  Vaping Use   Vaping Use: Never used  Substance and Sexual Activity   Alcohol use: No   Drug use: No   Sexual activity: Not on file  Other Topics Concern   Not on file  Social History Narrative   Not on file   Social Determinants of Health   Financial Resource Strain: Not on file  Food Insecurity: Not on file  Transportation Needs: Not on file  Physical Activity: Not on file  Stress: Not on file  Social Connections: Not on file     Family History: The patient's family history is not on file.  ROS:   Please see the history of present illness.     All other systems reviewed and are negative.  EKGs/Labs/Other Studies Reviewed:    The following studies were reviewed today:    EKG: Dec 12, 2022: Sinus bradycardia at 44.  Poor R wave progression-?  Previous septal wall myocardial infarction.  Recent Labs: 04/09/2022: ALT 30 04/10/2022: Hemoglobin 9.2; Platelets 161 04/11/2022: BUN 25; Creatinine, Ser 1.02; Potassium 4.0; Sodium 135  Recent Lipid Panel No results found for: "CHOL", "TRIG", "HDL", "CHOLHDL", "VLDL", "LDLCALC", "LDLDIRECT"   Risk Assessment/Calculations:            Physical Exam:    VS:  BP (!) 140/80   Pulse (!) 48   Ht 5\' 7"  (1.702 m)   LMP  (LMP Unknown)   SpO2 96%   BMI 22.93 kg/m     Wt Readings from Last 3 Encounters:  04/12/22 146 lb 6.2 oz (66.4 kg)  11/24/21 139 lb (63 kg)  07/11/21 145 lb (65.8 kg)     GEN:  Well nourished, well developed in no acute distress HEENT: Normal NECK: No JVD; No  carotid bruits LYMPHATICS: No lymphadenopathy CARDIAC: RRR, no murmurs, rubs, gallops, bradycardic  RESPIRATORY:  Clear to auscultation without rales, wheezing or rhonchi  ABDOMEN: Soft, non-tender, non-distended MUSCULOSKELETAL:  No edema; No deformity  SKIN: Warm and dry NEUROLOGIC:  Alert and oriented x 3 PSYCHIATRIC:  Normal affect   ASSESSMENT:    1. Syncope, unspecified syncope type    PLAN:    In order of problems listed above:  Sinus bradycardia:  She has asymptomatic sinus bradycardia.  I [place] discussion with the patient and her daughter.  She is not interested in having any surgical procedures.  She is not interested in getting a pacemaker. Given this, there is no indication for monitoring or other procedures            Medication Adjustments/Labs and Tests Ordered: Current medicines are reviewed at length with the patient today.  Concerns regarding medicines are outlined above.  Orders Placed This Encounter  Procedures   EKG 12-Lead   No orders of the defined types were placed in this encounter.   Patient Instructions  Medication Instructions:  Your physician recommends that you continue on your current medications as directed. Please refer to the Current Medication list given to you today.  *If you need a refill on your cardiac medications before your next appointment, please call your pharmacy*   Lab Work: NONE If you have labs (blood work) drawn today and your tests are completely normal, you will receive your results only by: MyChart Message (if you have MyChart) OR A paper copy in the  mail If you have any lab test that is abnormal or we need to change your treatment, we will call you to review the results.   Testing/Procedures: NONE   Follow-Up: At [facility], you and your health needs are our priority.  As part of our continuing mission to provide you with exceptional heart care, we have created designated Provider Care Teams.  These Care Teams include your primary Cardiologist (physician) and Advanced Practice Providers (APPs -  Physician Assistants and Nurse Practitioners) who all work together to provide you with the care you need, when you need it.  We recommend signing up for the patient portal called "MyChart".  Sign up information is provided on this After Visit Summary.  MyChart is used to connect with patients for Virtual Visits (Telemedicine).  Patients are able to view lab/test results, encounter notes, upcoming appointments, etc.  Non-urgent messages can be sent to your provider as well.   To learn more about what you can do with MyChart, go to [email or web address].    Your next appointment:   As Needed  Provider:   Kristeen Miss, MD     Signed, Kristeen Miss, MD  12/12/2022 3:02 PM    [facility]

## 2022-12-12 ENCOUNTER — Ambulatory Visit: Payer: Medicare Other | Attending: Cardiovascular Disease | Admitting: Cardiovascular Disease

## 2022-12-12 ENCOUNTER — Encounter: Payer: Self-pay | Admitting: Cardiovascular Disease

## 2022-12-12 VITALS — BP 140/80 | HR 48 | Ht 67.0 in

## 2022-12-12 DIAGNOSIS — R55 Syncope and collapse: Secondary | ICD-10-CM

## 2022-12-12 NOTE — Patient Instructions (Signed)
Medication Instructions:  Your physician recommends that you continue on your current medications as directed. Please refer to the Current Medication list given to you today.  *If you need a refill on your cardiac medications before your next appointment, please call your pharmacy*   Lab Work: NONE If you have labs (blood work) drawn today and your tests are completely normal, you will receive your results only by: MyChart Message (if you have MyChart) OR A paper copy in the mail If you have any lab test that is abnormal or we need to change your treatment, we will call you to review the results.   Testing/Procedures: NONE   Follow-Up: At [facility], you and your health needs are our priority.  As part of our continuing mission to provide you with exceptional heart care, we have created designated Provider Care Teams.  These Care Teams include your primary Cardiologist (physician) and Advanced Practice Providers (APPs -  Physician Assistants and Nurse Practitioners) who all work together to provide you with the care you need, when you need it.  We recommend signing up for the patient portal called "MyChart".  Sign up information is provided on this After Visit Summary.  MyChart is used to connect with patients for Virtual Visits (Telemedicine).  Patients are able to view lab/test results, encounter notes, upcoming appointments, etc.  Non-urgent messages can be sent to your provider as well.   To learn more about what you can do with MyChart, go to [email or web address].    Your next appointment:   As Needed  Provider:   Philip Nahser, MD
